# Patient Record
Sex: Male | Born: 1965 | Race: Black or African American | Hispanic: No | Marital: Single | Smoking: Former smoker
Health system: Southern US, Community
[De-identification: ages and names within clinical notes are randomized; demographics above are authoritative.]

## PROBLEM LIST (undated history)

## (undated) DIAGNOSIS — I77819 Aortic ectasia, unspecified site: Secondary | ICD-10-CM

## (undated) DIAGNOSIS — I491 Atrial premature depolarization: Secondary | ICD-10-CM

## (undated) DIAGNOSIS — I77810 Thoracic aortic ectasia: Secondary | ICD-10-CM

## (undated) DIAGNOSIS — I480 Paroxysmal atrial fibrillation: Secondary | ICD-10-CM

## (undated) DIAGNOSIS — I441 Atrioventricular block, second degree: Secondary | ICD-10-CM

## (undated) DIAGNOSIS — G4730 Sleep apnea, unspecified: Secondary | ICD-10-CM

## (undated) DIAGNOSIS — I10 Essential (primary) hypertension: Secondary | ICD-10-CM

## (undated) DIAGNOSIS — I2510 Atherosclerotic heart disease of native coronary artery without angina pectoris: Secondary | ICD-10-CM

## (undated) DIAGNOSIS — K219 Gastro-esophageal reflux disease without esophagitis: Secondary | ICD-10-CM

## (undated) DIAGNOSIS — I050 Rheumatic mitral stenosis: Secondary | ICD-10-CM

## (undated) DIAGNOSIS — I493 Ventricular premature depolarization: Secondary | ICD-10-CM

## (undated) DIAGNOSIS — J45909 Unspecified asthma, uncomplicated: Secondary | ICD-10-CM

## (undated) DIAGNOSIS — E119 Type 2 diabetes mellitus without complications: Secondary | ICD-10-CM

## (undated) DIAGNOSIS — I452 Bifascicular block: Secondary | ICD-10-CM

## (undated) DIAGNOSIS — I421 Obstructive hypertrophic cardiomyopathy: Secondary | ICD-10-CM

## (undated) DIAGNOSIS — I350 Nonrheumatic aortic (valve) stenosis: Secondary | ICD-10-CM

## (undated) HISTORY — DX: Atrial premature depolarization: I49.1

## (undated) HISTORY — DX: Ventricular premature depolarization: I49.3

## (undated) HISTORY — DX: Atherosclerotic heart disease of native coronary artery without angina pectoris: I25.10

## (undated) HISTORY — DX: Rheumatic mitral stenosis: I05.0

## (undated) HISTORY — DX: Atrioventricular block, second degree: I44.1

## (undated) HISTORY — DX: Thoracic aortic ectasia: I77.810

## (undated) HISTORY — DX: Obstructive hypertrophic cardiomyopathy: I42.1

## (undated) HISTORY — DX: Nonrheumatic aortic (valve) stenosis: I35.0

## (undated) HISTORY — PX: APPENDECTOMY: SHX54

## (undated) HISTORY — DX: Aortic ectasia, unspecified site: I77.819

## (undated) HISTORY — DX: Paroxysmal atrial fibrillation: I48.0

## (undated) HISTORY — DX: Bifascicular block: I45.2

---

## 1997-11-20 ENCOUNTER — Emergency Department (HOSPITAL_COMMUNITY): Admission: EM | Admit: 1997-11-20 | Discharge: 1997-11-20 | Payer: Self-pay | Admitting: Emergency Medicine

## 2003-04-05 ENCOUNTER — Emergency Department (HOSPITAL_COMMUNITY): Admission: AD | Admit: 2003-04-05 | Discharge: 2003-04-05 | Payer: Self-pay | Admitting: Family Medicine

## 2003-04-06 ENCOUNTER — Emergency Department (HOSPITAL_COMMUNITY): Admission: EM | Admit: 2003-04-06 | Discharge: 2003-04-06 | Payer: Self-pay | Admitting: Emergency Medicine

## 2013-08-09 ENCOUNTER — Ambulatory Visit: Payer: Self-pay

## 2013-08-10 ENCOUNTER — Emergency Department (HOSPITAL_COMMUNITY): Payer: Self-pay

## 2013-08-10 ENCOUNTER — Encounter (HOSPITAL_COMMUNITY): Payer: Self-pay | Admitting: Emergency Medicine

## 2013-08-10 ENCOUNTER — Emergency Department (INDEPENDENT_AMBULATORY_CARE_PROVIDER_SITE_OTHER)
Admission: EM | Admit: 2013-08-10 | Discharge: 2013-08-10 | Disposition: A | Discharge status: Other Acute Inpatient Hospital | Payer: Self-pay | Source: Home / Self Care | Attending: Family Medicine | Admitting: Family Medicine

## 2013-08-10 ENCOUNTER — Inpatient Hospital Stay (HOSPITAL_COMMUNITY)
Admission: EM | Admit: 2013-08-10 | Discharge: 2013-08-12 | DRG: 639 | Disposition: A | Discharge status: Home / Self Care | Payer: Self-pay | Attending: Internal Medicine | Admitting: Internal Medicine

## 2013-08-10 DIAGNOSIS — Z23 Encounter for immunization: Secondary | ICD-10-CM

## 2013-08-10 DIAGNOSIS — E119 Type 2 diabetes mellitus without complications: Secondary | ICD-10-CM | POA: Diagnosis present

## 2013-08-10 DIAGNOSIS — M25519 Pain in unspecified shoulder: Secondary | ICD-10-CM | POA: Diagnosis present

## 2013-08-10 DIAGNOSIS — E118 Type 2 diabetes mellitus with unspecified complications: Secondary | ICD-10-CM

## 2013-08-10 DIAGNOSIS — K5900 Constipation, unspecified: Secondary | ICD-10-CM | POA: Diagnosis present

## 2013-08-10 DIAGNOSIS — E1110 Type 2 diabetes mellitus with ketoacidosis without coma: Secondary | ICD-10-CM

## 2013-08-10 DIAGNOSIS — G8929 Other chronic pain: Secondary | ICD-10-CM | POA: Diagnosis present

## 2013-08-10 DIAGNOSIS — I10 Essential (primary) hypertension: Secondary | ICD-10-CM | POA: Diagnosis present

## 2013-08-10 DIAGNOSIS — I519 Heart disease, unspecified: Secondary | ICD-10-CM | POA: Diagnosis present

## 2013-08-10 DIAGNOSIS — F17200 Nicotine dependence, unspecified, uncomplicated: Secondary | ICD-10-CM | POA: Diagnosis present

## 2013-08-10 DIAGNOSIS — J45909 Unspecified asthma, uncomplicated: Secondary | ICD-10-CM | POA: Insufficient documentation

## 2013-08-10 DIAGNOSIS — E1310 Other specified diabetes mellitus with ketoacidosis without coma: Principal | ICD-10-CM | POA: Diagnosis present

## 2013-08-10 DIAGNOSIS — K219 Gastro-esophageal reflux disease without esophagitis: Secondary | ICD-10-CM | POA: Insufficient documentation

## 2013-08-10 HISTORY — DX: Gastro-esophageal reflux disease without esophagitis: K21.9

## 2013-08-10 HISTORY — DX: Unspecified asthma, uncomplicated: J45.909

## 2013-08-10 HISTORY — DX: Type 2 diabetes mellitus without complications: E11.9

## 2013-08-10 HISTORY — DX: Type 2 diabetes mellitus with ketoacidosis without coma: E11.10

## 2013-08-10 HISTORY — DX: Essential (primary) hypertension: I10

## 2013-08-10 LAB — GLUCOSE, CAPILLARY
Glucose-Capillary: 383 mg/dL — ABNORMAL HIGH (ref 70–99)
Glucose-Capillary: 288 mg/dL — ABNORMAL HIGH (ref 70–99)
Glucose-Capillary: 293 mg/dL — ABNORMAL HIGH (ref 70–99)
Glucose-Capillary: 264 mg/dL — ABNORMAL HIGH (ref 70–99)
Glucose-Capillary: 214 mg/dL — ABNORMAL HIGH (ref 70–99)
Glucose-Capillary: 242 mg/dL — ABNORMAL HIGH (ref 70–99)
Glucose-Capillary: 183 mg/dL — ABNORMAL HIGH (ref 70–99)
Glucose-Capillary: 172 mg/dL — ABNORMAL HIGH (ref 70–99)
Glucose-Capillary: 155 mg/dL — ABNORMAL HIGH (ref 70–99)

## 2013-08-10 LAB — POCT I-STAT, CHEM 8
BUN: 28 mg/dL — ABNORMAL HIGH (ref 6–23)
Calcium, Ion: 1.28 mmol/L — ABNORMAL HIGH (ref 1.12–1.23)
Chloride: 94 mEq/L — ABNORMAL LOW (ref 96–112)
Creatinine, Ser: 1.1 mg/dL (ref 0.50–1.35)
Glucose, Bld: 592 mg/dL (ref 70–99)
HCT: 49 % (ref 39.0–52.0)
Hemoglobin: 16.7 g/dL (ref 13.0–17.0)
Potassium: 4.9 mEq/L (ref 3.7–5.3)
Sodium: 131 mEq/L — ABNORMAL LOW (ref 137–147)
TCO2: 24 mmol/L (ref 0–100)

## 2013-08-10 LAB — POCT URINALYSIS DIP (DEVICE)
Bilirubin Urine: NEGATIVE
Glucose, UA: >=1000 mg/dL — AB
Ketones, ur: 40 mg/dL — AB
Leukocytes, UA: NEGATIVE
Nitrite: NEGATIVE
Protein, ur: NEGATIVE mg/dL
Specific Gravity, Urine: <=1.005 (ref 1.005–1.030)
Urobilinogen, UA: 0.2 mg/dL (ref 0.0–1.0)
pH: 5.5 (ref 5.0–8.0)

## 2013-08-10 LAB — BASIC METABOLIC PANEL
BUN: 21 mg/dL (ref 6–23)
BUN: 19 mg/dL (ref 6–23)
BUN: 17 mg/dL (ref 6–23)
CO2: 17 mEq/L — ABNORMAL LOW (ref 19–32)
CO2: 22 mEq/L (ref 19–32)
CO2: 23 mEq/L (ref 19–32)
Calcium: 9.4 mg/dL (ref 8.4–10.5)
Calcium: 9.4 mg/dL (ref 8.4–10.5)
Calcium: 9 mg/dL (ref 8.4–10.5)
Chloride: 96 mEq/L (ref 96–112)
Chloride: 97 mEq/L (ref 96–112)
Chloride: 99 mEq/L (ref 96–112)
Creatinine, Ser: 0.75 mg/dL (ref 0.50–1.35)
Creatinine, Ser: 0.76 mg/dL (ref 0.50–1.35)
Creatinine, Ser: 0.72 mg/dL (ref 0.50–1.35)
GFR calc Af Amer: >90 mL/min (ref >90)
GFR calc Af Amer: >90 mL/min (ref >90)
GFR calc Af Amer: >90 mL/min (ref >90)
GFR calc non Af Amer: >90 mL/min (ref >90)
GFR calc non Af Amer: >90 mL/min (ref >90)
GFR calc non Af Amer: >90 mL/min (ref >90)
Glucose, Bld: 365 mg/dL — ABNORMAL HIGH (ref 70–99)
Glucose, Bld: 246 mg/dL — ABNORMAL HIGH (ref 70–99)
Glucose, Bld: 164 mg/dL — ABNORMAL HIGH (ref 70–99)
Potassium: 4.5 mEq/L (ref 3.7–5.3)
Potassium: 4 mEq/L (ref 3.7–5.3)
Potassium: 3.6 mEq/L — ABNORMAL LOW (ref 3.7–5.3)
Sodium: 135 mEq/L — ABNORMAL LOW (ref 137–147)
Sodium: 136 mEq/L — ABNORMAL LOW (ref 137–147)
Sodium: 137 mEq/L (ref 137–147)

## 2013-08-10 LAB — COMPREHENSIVE METABOLIC PANEL
ALT: 16 U/L (ref 0–53)
AST: 18 U/L (ref 0–37)
Albumin: 3.9 g/dL (ref 3.5–5.2)
Alkaline Phosphatase: 99 U/L (ref 39–117)
BUN: 27 mg/dL — ABNORMAL HIGH (ref 6–23)
CO2: 23 mEq/L (ref 19–32)
Calcium: 10.3 mg/dL (ref 8.4–10.5)
Chloride: 88 mEq/L — ABNORMAL LOW (ref 96–112)
Creatinine, Ser: 0.97 mg/dL (ref 0.50–1.35)
GFR calc Af Amer: >90 mL/min (ref >90)
GFR calc non Af Amer: >90 mL/min (ref >90)
Glucose, Bld: 577 mg/dL (ref 70–99)
Potassium: 5.6 mEq/L — ABNORMAL HIGH (ref 3.7–5.3)
Sodium: 132 mEq/L — ABNORMAL LOW (ref 137–147)
Total Bilirubin: 0.3 mg/dL (ref 0.3–1.2)
Total Protein: 8.2 g/dL (ref 6.0–8.3)

## 2013-08-10 LAB — I-STAT ARTERIAL BLOOD GAS, ED
Acid-base deficit: 4 mmol/L — ABNORMAL HIGH (ref 0.0–2.0)
Bicarbonate: 20.8 mEq/L (ref 20.0–24.0)
O2 Saturation: 97 %
TCO2: 22 mmol/L (ref 0–100)
pCO2 arterial: 35.4 mmHg (ref 35.0–45.0)
pH, Arterial: 7.377 (ref 7.350–7.450)
pO2, Arterial: 87 mmHg (ref 80.0–100.0)

## 2013-08-10 LAB — URINALYSIS, ROUTINE W REFLEX MICROSCOPIC
Bilirubin Urine: NEGATIVE
Glucose, UA: >1000 mg/dL — AB
Hgb urine dipstick: NEGATIVE
Ketones, ur: 40 mg/dL — AB
Leukocytes, UA: NEGATIVE
Nitrite: NEGATIVE
Protein, ur: NEGATIVE mg/dL
Specific Gravity, Urine: 1.039 — ABNORMAL HIGH (ref 1.005–1.030)
Urobilinogen, UA: 0.2 mg/dL (ref 0.0–1.0)
pH: 5 (ref 5.0–8.0)

## 2013-08-10 LAB — CBC
HCT: 42.4 % (ref 39.0–52.0)
Hemoglobin: 15.7 g/dL (ref 13.0–17.0)
MCH: 33.6 pg (ref 26.0–34.0)
MCHC: 37 g/dL — ABNORMAL HIGH (ref 30.0–36.0)
MCV: 90.8 fL (ref 78.0–100.0)
Platelets: 223 10*3/uL (ref 150–400)
RBC: 4.67 MIL/uL (ref 4.22–5.81)
RDW: 11.9 % (ref 11.5–15.5)
WBC: 9.8 10*3/uL (ref 4.0–10.5)

## 2013-08-10 LAB — URINE MICROSCOPIC-ADD ON

## 2013-08-10 MED ORDER — ACETAMINOPHEN 325 MG PO TABS
650.0000 mg | ORAL_TABLET | Freq: Four times a day (QID) | ORAL | Status: DC | PRN
  Administered 2013-08-10 – 2013-08-11 (×2): 650 mg via ORAL
  Filled 2013-08-10 (×2): qty 2

## 2013-08-10 MED ORDER — SODIUM CHLORIDE 0.9 % IV SOLN
INTRAVENOUS | Status: DC
  Administered 2013-08-10: 2.3 [IU]/h via INTRAVENOUS
  Administered 2013-08-10: 3.2 [IU]/h via INTRAVENOUS
  Administered 2013-08-10: 4.7 [IU]/h via INTRAVENOUS
  Administered 2013-08-10: 3.1 [IU]/h via INTRAVENOUS
  Administered 2013-08-11: 9.2 [IU]/h via INTRAVENOUS
  Administered 2013-08-11: 2.6 [IU]/h via INTRAVENOUS
  Administered 2013-08-11: 7.4 [IU]/h via INTRAVENOUS
  Administered 2013-08-11: 2.3 [IU]/h via INTRAVENOUS
  Administered 2013-08-11: 2.6 [IU]/h via INTRAVENOUS
  Administered 2013-08-11 (×2): 4.4 [IU]/h via INTRAVENOUS
  Filled 2013-08-10 (×2): qty 1

## 2013-08-10 MED ORDER — PNEUMOCOCCAL VAC POLYVALENT 25 MCG/0.5ML IJ INJ
0.5000 mL | INJECTION | INTRAMUSCULAR | Status: AC
  Administered 2013-08-11: 0.5 mL via INTRAMUSCULAR
  Filled 2013-08-10: qty 0.5

## 2013-08-10 MED ORDER — SODIUM CHLORIDE 0.9 % IV SOLN
INTRAVENOUS | Status: DC
  Administered 2013-08-10 (×2): via INTRAVENOUS

## 2013-08-10 MED ORDER — SODIUM CHLORIDE 0.9 % IV BOLUS (SEPSIS)
2000.0000 mL | Freq: Once | INTRAVENOUS | Status: AC
  Administered 2013-08-10: 2000 mL via INTRAVENOUS

## 2013-08-10 MED ORDER — ACETAMINOPHEN 650 MG RE SUPP: 650.0000 mg | Freq: Four times a day (QID) | RECTAL | Status: DC | PRN

## 2013-08-10 MED ORDER — ENOXAPARIN SODIUM 40 MG/0.4ML ~~LOC~~ SOLN
40.0000 mg | SUBCUTANEOUS | Status: DC
  Administered 2013-08-10 – 2013-08-11 (×2): 40 mg via SUBCUTANEOUS
  Filled 2013-08-10 (×3): qty 0.4

## 2013-08-10 MED ORDER — INFLUENZA VAC SPLIT QUAD 0.5 ML IM SUSP
0.5000 mL | INTRAMUSCULAR | Status: AC
  Administered 2013-08-11: 0.5 mL via INTRAMUSCULAR
  Filled 2013-08-10: qty 0.5

## 2013-08-10 MED ORDER — DEXTROSE-NACL 5-0.45 % IV SOLN
INTRAVENOUS | Status: DC
  Administered 2013-08-10 – 2013-08-11 (×3): via INTRAVENOUS

## 2013-08-10 MED ORDER — ALBUTEROL SULFATE (2.5 MG/3ML) 0.083% IN NEBU: 2.5000 mg | INHALATION_SOLUTION | Freq: Four times a day (QID) | RESPIRATORY_TRACT | Status: DC | PRN

## 2013-08-10 MED ORDER — DEXTROSE 50 % IV SOLN: 25.0000 mL | INTRAVENOUS | Status: DC | PRN

## 2013-08-10 MED ORDER — LIVING WELL WITH DIABETES BOOK
Freq: Once | Status: DC
  Filled 2013-08-10: qty 1

## 2013-08-10 MED ORDER — INSULIN REGULAR BOLUS VIA INFUSION
0.0000 [IU] | Freq: Three times a day (TID) | INTRAVENOUS | Status: DC
  Filled 2013-08-10: qty 10

## 2013-08-10 NOTE — ED Provider Notes (Signed)
CSN: [id number]     Arrival date & time 08/10/13  5732 History   First MD Initiated Contact with Patient 08/10/13 [phone]     Chief Complaint  Patient presents with  . Blood Sugar Problem   (Consider location/radiation/quality/duration/timing/severity/associated sxs/prior Treatment) Patient is a 48 y.o. male presenting with diabetes problem. The history is provided by the patient and the spouse.  Diabetes This is a chronic problem. The current episode started more than 1 week ago. The problem has been gradually worsening. Pertinent negatives include no abdominal pain. Associated symptoms comments: Polydipsia, polyuria for sev mos., nocturia..    Past Medical History  Diagnosis Date  . Diabetes mellitus without complication   . Hypertension    History reviewed. No pertinent past surgical history. History reviewed. No pertinent family history. History  Substance Use Topics  . Smoking status: Current Every Day Smoker  . Smokeless tobacco: Not on file  . Alcohol Use: No    Review of Systems  Constitutional: Negative.   Gastrointestinal: Negative for abdominal pain.  Endocrine: Positive for polydipsia and polyuria.  Genitourinary: Positive for frequency.    Allergies  Review of patient's allergies indicates not on file.  Home Medications  No current outpatient prescriptions on file. BP 137/79  Pulse 92  Temp(Src) 97.5 F (36.4 C) (Oral)  Resp 16  SpO2 99% Physical Exam  Nursing note and vitals reviewed. Constitutional: He is oriented to person, place, and time. He appears well-developed and well-nourished.  Neck: Normal range of motion. Neck supple.  Cardiovascular: Normal heart sounds.   Pulmonary/Chest: Effort normal and breath sounds normal.  Lymphadenopathy:    He has no cervical adenopathy.  Neurological: He is alert and oriented to person, place, and time.  Skin: Skin is warm and dry.    ED Course  Procedures (including critical care time) Labs Review Labs  Reviewed  POCT I-STAT, CHEM 8 - Abnormal; Notable for the following:    Sodium 131 (*)    Chloride 94 (*)    BUN 28 (*)    Glucose, Bld 592 (*)    Calcium, Ion 1.28 (*)    All other components within normal limits  POCT URINALYSIS DIP (DEVICE) - Abnormal; Notable for the following:    Glucose, UA >=1000 (*)    Ketones, ur 40 (*)    Hgb urine dipstick TRACE (*)    All other components within normal limits   Imaging Review No results found. i-stat glu 592.  MDM   1. DM (diabetes mellitus) with complications     Sent for early dka. No lmd.    Billy Fischer, MD 08/10/13 [phone]

## 2013-08-10 NOTE — H&P (Signed)
Date: 08/10/2013               Patient Name:  Allen Gould MRN: [id number]  DOB: [birth year] Age / Sex: 48 y.o., male   PCP: No Pcp Per Patient         Medical Service: Internal Medicine Teaching Service         Attending Physician: Dr. Bartholomew Crews, MD    First Contact: Dr. Stann Mainland Pager: [phone]  Second Contact: Dr. Algis Liming Pager: [phone]       After Hours (After 5p/  First Contact Pager: [phone]  weekends / holidays): Second Contact Pager: [phone]   Chief Complaint: hyperglycemia  History of Present Illness:  Allen Gould is a 48 year old man with history of DM2 (no A1C on record), HTN, asthma who presents with polydipsia, polyuria, abdominal pain x 2 weeks.   Patient states he has been drinking water and urinating non-stop for the past 2 weeks.  He has also had intermittent abdominal pain and one episode of emesis 3 days ago that looked like food he had eaten, no blood.  He sought medical attention at Urgent Care this morning where he was told his blood sugar was in high 500s and sent to ED.  He has not had any medical follow-up in 3 years, has been treated with insulin in past but has not taken any medications including insulin in over 2 years.  Patient reports one previous episode of DKA.  Denies headache, chest pain, shortness of breath, nausea, dysuria, change in bowel habits (though reported constipation to EDP).   Patient also complaining of chronic right shoulder pain that he believes is due to rotator cuff tear.  States that his shoulder hurts all the time, not worsened with movement.  He tried taking Aleve which helped some, but he stopped taking it when he felt it was no longer working.   In ED, labs consistent with DKA: BMP showed glucose 577, HCO3 23, anion gap 21. ABG revealed pH 7.377. + ketonuria. CBC showed WBC 9.8 (within normal limits).  Patient was given 2L NS bolus, started on insulin gtt, and IMTS called for admission. Most recent CBG 383.    Meds: Current  Facility-Administered Medications  Medication Dose Route Frequency Provider Last Rate Last Dose  . insulin regular (NOVOLIN R,HUMULIN R) 1 Units/mL in sodium chloride 0.9 % 100 mL infusion   Intravenous Continuous Rebecca Eaton, MD       No current outpatient prescriptions on file.    Allergies: Allergies as of 08/10/2013 - Review Complete 08/10/2013  Allergen Reaction Noted  . Shellfish allergy Hives 08/10/2013   Past Medical History  Diagnosis Date  . Diabetes mellitus without complication   . Hypertension   . Asthma   . Heart disease    History reviewed. No pertinent past surgical history. History reviewed. No pertinent family history. History   Social History  . Marital Status: Significant Other    Spouse Name: N/A    Number of Children: N/A  . Years of Education: N/A   Occupational History  . Not on file.   Social History Main Topics  . Smoking status: Current Some Day Smoker    Types: Cigarettes  . Smokeless tobacco: Not on file  . Alcohol Use: No  . Drug Use: No  . Sexual Activity: Not on file   Other Topics Concern  . Not on file   Social History Narrative  . No narrative on file  Patient smokes one  cigarette/day, no EtOH, no illicits.  Lives with significant other.    Review of Systems: Review of Systems  Constitutional: Negative for fever.  Eyes: Positive for blurred vision.  Respiratory: Negative for cough and shortness of breath.   Cardiovascular: Negative for chest pain and leg swelling.  Gastrointestinal: Positive for vomiting. Negative for nausea, abdominal pain, diarrhea and constipation.       Emesis 3 days ago  Genitourinary: Negative for dysuria, urgency and frequency.  Musculoskeletal: Positive for joint pain. Negative for falls and myalgias.       Right shoulder x 10 months  Skin: Negative for rash.  Neurological: Negative for dizziness, loss of consciousness, weakness and headaches.  Endo/Heme/Allergies: Positive for polydipsia.         + polyuria    Physical Exam: Blood pressure 112/87, pulse 88, temperature 98.2 F (36.8 C), temperature source Oral, resp. rate 16, height 5\' 10"  (1.778 m), weight 294 lb 3.2 oz (133.448 kg), SpO2 98.00%. PEX General: alert, cooperative, and in no apparent distress HEENT: NCAT, vision grossly intact, oropharynx clear and non-erythematous  Neck: supple, no lymphadenopathy Lungs: clear to ascultation bilaterally, normal work of respiration, no wheezes, rales, ronchi Heart: regular rate and rhythm, no murmurs, gallops, or rubs Abdomen: soft, non-tender, non-distended, normal bowel sounds Extremities: 2+ DP/PT pulses bilaterally, no cyanosis, clubbing, or edema Neurologic: alert & oriented X3, cranial nerves II-XII intact, strength grossly intact, sensation intact to light touch   Lab results: Basic Metabolic Panel:  Recent Labs  08/10/13 0913 08/10/13 1012  NA 131* 132*  K 4.9 5.6*  CL 94* 88*  CO2  --  23  GLUCOSE 592* 577*  BUN 28* 27*  CREATININE 1.10 0.97  CALCIUM  --  10.3   Liver Function Tests:  Recent Labs  08/10/13 1012  AST 18  ALT 16  ALKPHOS 99  BILITOT 0.3  PROT 8.2  ALBUMIN 3.9   CBC:  Recent Labs  08/10/13 0913 08/10/13 1012  WBC  --  9.8  HGB 16.7 15.7  HCT 49.0 42.4  MCV  --  90.8  PLT  --  223   Urinalysis:  Recent Labs  08/10/13 0919 08/10/13 1240  COLORURINE  --  YELLOW  LABSPEC <=1.005 1.039*  PHURINE 5.5 5.0  GLUCOSEU >=1000* >1000*  HGBUR TRACE* NEGATIVE  BILIRUBINUR NEGATIVE NEGATIVE  KETONESUR 40* 40*  PROTEINUR NEGATIVE NEGATIVE  UROBILINOGEN 0.2 0.2  NITRITE NEGATIVE NEGATIVE  LEUKOCYTESUR NEGATIVE NEGATIVE    Imaging results:  Dg Chest 2 View  08/10/2013   CLINICAL DATA:  Hyperglycemia  EXAM: CHEST  2 VIEW  COMPARISON:  07/09/2008  FINDINGS: Cardiomediastinal silhouette is unremarkable. No acute infiltrate or pleural effusion. No pulmonary edema. Bony thorax is unremarkable.  IMPRESSION: No active  cardiopulmonary disease.   Electronically Signed   By: Lahoma Crocker M.D.   On: 08/10/2013 12:24    Other results: EKG: pending  Assessment & Plan by Problem: #DKA- On admission, BMP showed glucose 577, HCO3 23, AG 21, K 5.6; + ketonuria.  ABG showed pH 7.377, HCO3 21.  No evidence of infection, afebrile, no tachycardia or tachypnea, no leucocytosis.  CXR negative, UA negative for infection.  DKA was likely precipitated by lack of insulin for the past 2.5 years days. He is feeling better since fluids and insulin treatment, no complaints other than still feeling somewhat thirsty.  -admit to IMTS on telemetry  -DKA protocol (continue insulin gtt, IVFs, NPO)  -NSS --> D5-0.5NSS once BG < 250  -  BMPs q2h until AG closed x 2  -monitor K and replete as necessary  -start [place] insulin (Lantus 10) once AG closed x 2  -continue insulin drip for 2 hours after [place] insulin started  -EKG pending  -repeat A1C pending -consider consult to diabetes educator -arrange PCP follow-up   #History of HTN- Currently normotensive on no home meds.  -continue to monitor  #History of asthma- No shortness of breath or wheezing, on no home meds.  -continue to monitor  #DVT PPX- lovenox    Dispo: Disposition is deferred at this time, awaiting improvement of current medical problems. Anticipated discharge in approximately 1-2 day(s).   The patient does not have a current PCP (No Pcp Per Patient) and does need an [facility] hospital follow-up appointment after discharge.   Signed: [name], MD 08/10/2013, 1:36 PM

## 2013-08-10 NOTE — ED Notes (Addendum)
Pt  Reports  Symptoms    Of  incraesed  Thirst  And  Frequent  Urination as  Well  As     Gi    Symptoms  To  Include        Dyspepsia    And        Constipation      Pt  Also  Reports  Symptoms   Of     Pain in r  Shoulder    That is  Worse  On movement         Voices  No  Recent  Injury  Pt  Reports  Takes  No  meds  And  Has  Not seen a  Provider    In  Years

## 2013-08-10 NOTE — Progress Notes (Signed)
Cesar Rogerson [id number] Admission Data: 08/10/2013 2:23 PM Attending Provider: Bartholomew Crews, MD  PCP:No PCP Per Patient Consults/ Treatment Team:    Allen Gould Reasoner is a 48 y.o. male patient admitted from ED awake, alert  & orientated  X 3,  No Order, VSS - Blood pressure 145/91, pulse 78, temperature 97.8 F (36.6 C), temperature source Oral, resp. rate 18, height 5\' 10"  (1.778 m), weight 133.04 kg (293 lb 4.8 oz), SpO2 98.00%., no c/o shortness of breath, no c/o chest pain, no distress noted. Tele # 1 placed and pt is currently running:normal sinus rhythm.   IV site WDL:  forearm right, condition patent and no redness with a transparent dsg that's clean dry and intact.  Allergies:   Allergies  Allergen Reactions  . Shellfish Allergy Hives     Past Medical History  Diagnosis Date  . Diabetes mellitus without complication   . Hypertension   . Asthma   . Heart disease     History:  obtained from the patient. Tobacco/alcohol: 1-2 cigarettes/day x 10 year  none  Pt orientation to unit, room and routine. Information packet given to patient/family and safety video watched.  Admission INP armband ID verified with patient/family, and in place. SR up x 2, fall risk assessment complete with Patient and family verbalizing understanding of risks associated with falls. Pt verbalizes an understanding of how to use the call bell and to call for help before getting out of bed.  Skin, clean-dry- intact without evidence of bruising, or skin tears.   No evidence of skin break down noted on exam. no rashes, no ecchymoses    Will cont to monitor and assist as needed.  Symphany Fleissner Margaretha Sheffield, RN 08/10/2013 2:23 PM

## 2013-08-10 NOTE — ED Notes (Signed)
Pt transfer from [facility] for hyperglycemia. He went for evaluation today due to recent "abd pain, cant sleep, blurry vision, frequent urination, fatigue, nausea." he has history of diabetes but has not been on medication for it.

## 2013-08-10 NOTE — ED Provider Notes (Signed)
I saw and evaluated the patient, reviewed the resident's note and I agree with the findings and plan. If applicable, I agree with the resident's interpretation of the EKG.  If applicable, I was present for critical portions of any procedures performed.  10 days of blurry vision, polyuria, polydipsia and abdominal pain.  No fever.  No DM meds x 2 years. Nontoxic, abdomen soft, diffusely tender DKA with AG 21. IVF, insulin gtt, admit.  CRITICAL CARE Performed by: Ezequiel Essex Total critical care time: 30 Critical care time was exclusive of separately billable procedures and treating other patients. Critical care was necessary to treat or prevent imminent or life-threatening deterioration. Critical care was time spent personally by me on the following activities: development of treatment plan with patient and/or surrogate as well as nursing, discussions with consultants, evaluation of patient's response to treatment, examination of patient, obtaining history from patient or surrogate, ordering and performing treatments and interventions, ordering and review of laboratory studies, ordering and review of radiographic studies, pulse oximetry and re-evaluation of patient's condition.   Ezequiel Essex, MD 08/10/13 [phone]

## 2013-08-10 NOTE — ED Provider Notes (Signed)
CSN: [id number]     Arrival date & time 08/10/13  9323 History   None    Chief Complaint  Patient presents with  . Hyperglycemia     (Consider location/radiation/quality/duration/timing/severity/associated sxs/prior Treatment) Patient is a 48 y.o. male presenting with hyperglycemia. The history is provided by the patient and the spouse. No language interpreter was used.  Hyperglycemia Associated symptoms: abdominal pain, increased thirst, nausea and polyuria   Associated symptoms: no chest pain, no dysuria, no fever, no shortness of breath and no vomiting    This is a 48yo AAM w/ PMH DM, HTN, asthma who presents with c/o hyperglycemia, polyuria, polydipsia, intermittent blurry vision and abd pain x 1.5 wks. Patient reports that he was over at the Urgent Care clinic this AM and was told his blood glucose was 592. He was treated for DM in the past w/ insulin but has not been on insulin for over 2 years. He notes he has been urinating and drinking water "nonstop" over the last week. Patient notes having diffuse abd pain over this same time period associated w/ some nausea, no vomiting. He has been constipated recently as well. No SOB, CP, BLE edema, HA, dysuria.   Past Medical History  Diagnosis Date  . Diabetes mellitus without complication   . Hypertension   . Asthma   . Heart disease    History reviewed. No pertinent past surgical history. History reviewed. No pertinent family history. History  Substance Use Topics  . Smoking status: Current Some Day Smoker    Types: Cigarettes  . Smokeless tobacco: Not on file  . Alcohol Use: No    Review of Systems  Constitutional: Negative for fever and chills.  Eyes: Positive for visual disturbance.  Respiratory: Negative for cough and shortness of breath.   Cardiovascular: Negative for chest pain and leg swelling.  Gastrointestinal: Positive for nausea, abdominal pain and constipation. Negative for vomiting and diarrhea.  Endocrine:  Positive for polydipsia and polyuria.  Genitourinary: Positive for frequency. Negative for dysuria.  Musculoskeletal: Positive for back pain.  Neurological: Negative for light-headedness and headaches.  All other systems reviewed and are negative.      Allergies  Shellfish allergy  Home Medications  No current outpatient prescriptions on file. BP 134/92  Pulse 92  Temp(Src) 98.2 F (36.8 C) (Oral)  Resp 16  Ht 5\' 10"  (1.778 m)  Wt 294 lb 3.2 oz (133.448 kg)  BMI 42.21 kg/m2  SpO2 98% Physical Exam  Nursing note and vitals reviewed. Constitutional: He is oriented to person, place, and time. He appears well-developed and well-nourished. No distress.  HENT:  Head: Normocephalic and atraumatic.  Mouth/Throat: Oropharynx is clear and moist.  Eyes: Conjunctivae and EOM are normal. Pupils are equal, round, and reactive to light.  Neck: Normal range of motion. Neck supple.  Cardiovascular: Normal rate, regular rhythm and normal heart sounds.   Pulmonary/Chest: Effort normal and breath sounds normal. No respiratory distress.  Abdominal: Soft. There is tenderness. There is no rebound.  Protuberant abdomen w/ diminished bowel sounds; diffusely ttp  Musculoskeletal: He exhibits no edema.  Neurological: He is alert and oriented to person, place, and time. No cranial nerve deficit.  Skin: Skin is warm and dry. He is not diaphoretic.    ED Course  Procedures (including critical care time) Labs Review Labs Reviewed  CBC - Abnormal; Notable for the following:    MCHC 37.0 (*)    All other components within normal limits  COMPREHENSIVE METABOLIC PANEL  URINALYSIS, ROUTINE W REFLEX MICROSCOPIC   Imaging Review No results found.   EKG Interpretation None      MDM   This is a 48yo AAM w/ PMH untreated DM, HTN, asthma who presents w/ abd pain, nausea and s/s of uncontrolled diabetes and found to have an AG 21 on CMP and ketones on UA- likely 2/2 DKA. Patient has not been taking  any insulin for over 2 years, though he has been on insulin therapy in the past. CBC unrevealing. BUN/Cr ratio >20, which is indicative of dehydration. K 5.6. Will give 2L NS and start an insulin gtt. Will obtain ABG to assess pH.  1:18 PM Patient's VSS and he is stable. Patient's pH 7.37. I spoke with Dr. Elnora Morrison with IMTS. They will admit patient.   Rebecca Eaton, MD 08/10/13 1320

## 2013-08-10 NOTE — Progress Notes (Signed)
Received report from [employer].

## 2013-08-11 DIAGNOSIS — E1010 Type 1 diabetes mellitus with ketoacidosis without coma: Secondary | ICD-10-CM

## 2013-08-11 LAB — GLUCOSE, CAPILLARY
Glucose-Capillary: 107 mg/dL — ABNORMAL HIGH (ref 70–99)
Glucose-Capillary: 126 mg/dL — ABNORMAL HIGH (ref 70–99)
Glucose-Capillary: 135 mg/dL — ABNORMAL HIGH (ref 70–99)
Glucose-Capillary: 147 mg/dL — ABNORMAL HIGH (ref 70–99)
Glucose-Capillary: 147 mg/dL — ABNORMAL HIGH (ref 70–99)
Glucose-Capillary: 147 mg/dL — ABNORMAL HIGH (ref 70–99)
Glucose-Capillary: 148 mg/dL — ABNORMAL HIGH (ref 70–99)
Glucose-Capillary: 155 mg/dL — ABNORMAL HIGH (ref 70–99)
Glucose-Capillary: 156 mg/dL — ABNORMAL HIGH (ref 70–99)
Glucose-Capillary: 156 mg/dL — ABNORMAL HIGH (ref 70–99)
Glucose-Capillary: 161 mg/dL — ABNORMAL HIGH (ref 70–99)
Glucose-Capillary: 164 mg/dL — ABNORMAL HIGH (ref 70–99)
Glucose-Capillary: 164 mg/dL — ABNORMAL HIGH (ref 70–99)
Glucose-Capillary: 171 mg/dL — ABNORMAL HIGH (ref 70–99)
Glucose-Capillary: 185 mg/dL — ABNORMAL HIGH (ref 70–99)
Glucose-Capillary: 243 mg/dL — ABNORMAL HIGH (ref 70–99)
Glucose-Capillary: 245 mg/dL — ABNORMAL HIGH (ref 70–99)
Glucose-Capillary: 297 mg/dL — ABNORMAL HIGH (ref 70–99)
Glucose-Capillary: 336 mg/dL — ABNORMAL HIGH (ref 70–99)
Glucose-Capillary: 340 mg/dL — ABNORMAL HIGH (ref 70–99)

## 2013-08-11 LAB — BASIC METABOLIC PANEL
BUN: 16 mg/dL (ref 6–23)
BUN: 15 mg/dL (ref 6–23)
BUN: 13 mg/dL (ref 6–23)
BUN: 13 mg/dL (ref 6–23)
CO2: 23 mEq/L (ref 19–32)
CO2: 21 mEq/L (ref 19–32)
CO2: 22 mEq/L (ref 19–32)
CO2: 23 mEq/L (ref 19–32)
Calcium: 8.8 mg/dL (ref 8.4–10.5)
Calcium: 8.7 mg/dL (ref 8.4–10.5)
Calcium: 8.8 mg/dL (ref 8.4–10.5)
Calcium: 8.9 mg/dL (ref 8.4–10.5)
Chloride: 99 mEq/L (ref 96–112)
Chloride: 99 mEq/L (ref 96–112)
Chloride: 102 mEq/L (ref 96–112)
Chloride: 99 mEq/L (ref 96–112)
Creatinine, Ser: 0.71 mg/dL (ref 0.50–1.35)
Creatinine, Ser: 0.76 mg/dL (ref 0.50–1.35)
Creatinine, Ser: 0.78 mg/dL (ref 0.50–1.35)
Creatinine, Ser: 0.78 mg/dL (ref 0.50–1.35)
GFR calc Af Amer: >90 mL/min (ref >90)
GFR calc Af Amer: >90 mL/min (ref >90)
GFR calc Af Amer: >90 mL/min (ref >90)
GFR calc Af Amer: >90 mL/min (ref >90)
GFR calc non Af Amer: >90 mL/min (ref >90)
GFR calc non Af Amer: >90 mL/min (ref >90)
GFR calc non Af Amer: >90 mL/min (ref >90)
GFR calc non Af Amer: >90 mL/min (ref >90)
Glucose, Bld: 170 mg/dL — ABNORMAL HIGH (ref 70–99)
Glucose, Bld: 182 mg/dL — ABNORMAL HIGH (ref 70–99)
Glucose, Bld: 129 mg/dL — ABNORMAL HIGH (ref 70–99)
Glucose, Bld: 193 mg/dL — ABNORMAL HIGH (ref 70–99)
Potassium: 3.3 mEq/L — ABNORMAL LOW (ref 3.7–5.3)
Potassium: 3.2 mEq/L — ABNORMAL LOW (ref 3.7–5.3)
Potassium: 4 mEq/L (ref 3.7–5.3)
Potassium: 4.1 mEq/L (ref 3.7–5.3)
Sodium: 137 mEq/L (ref 137–147)
Sodium: 135 mEq/L — ABNORMAL LOW (ref 137–147)
Sodium: 136 mEq/L — ABNORMAL LOW (ref 137–147)
Sodium: 135 mEq/L — ABNORMAL LOW (ref 137–147)

## 2013-08-11 LAB — HEMOGLOBIN A1C
Hgb A1c MFr Bld: 14.9 % — ABNORMAL HIGH (ref <5.7)
Mean Plasma Glucose: 381 mg/dL — ABNORMAL HIGH (ref <117)

## 2013-08-11 MED ORDER — INSULIN ASPART 100 UNIT/ML ~~LOC~~ SOLN
2.0000 [IU] | Freq: Once | SUBCUTANEOUS | Status: AC
  Administered 2013-08-11: 2 [IU] via SUBCUTANEOUS

## 2013-08-11 MED ORDER — INSULIN ASPART 100 UNIT/ML ~~LOC~~ SOLN
0.0000 [IU] | Freq: Three times a day (TID) | SUBCUTANEOUS | Status: DC
  Administered 2013-08-12: 3 [IU] via SUBCUTANEOUS

## 2013-08-11 MED ORDER — SODIUM CHLORIDE 0.9 % IV BOLUS (SEPSIS)
1000.0000 mL | Freq: Once | INTRAVENOUS | Status: AC
  Administered 2013-08-11: 1000 mL via INTRAVENOUS

## 2013-08-11 MED ORDER — POTASSIUM CHLORIDE CRYS ER 20 MEQ PO TBCR
40.0000 meq | EXTENDED_RELEASE_TABLET | Freq: Once | ORAL | Status: AC
  Administered 2013-08-11: 40 meq via ORAL
  Filled 2013-08-11: qty 2

## 2013-08-11 MED ORDER — SODIUM CHLORIDE 0.9 % IV SOLN
INTRAVENOUS | Status: DC
  Administered 2013-08-11 – 2013-08-12 (×2): via INTRAVENOUS

## 2013-08-11 MED ORDER — INSULIN GLARGINE 100 UNIT/ML ~~LOC~~ SOLN
10.0000 [IU] | Freq: Every day | SUBCUTANEOUS | Status: DC
  Administered 2013-08-11: 10 [IU] via SUBCUTANEOUS
  Filled 2013-08-11 (×2): qty 0.1

## 2013-08-11 MED ORDER — METFORMIN HCL 500 MG PO TABS: 500.0000 mg | ORAL_TABLET | Freq: Two times a day (BID) | ORAL | Status: DC

## 2013-08-11 NOTE — Progress Notes (Signed)
Subjective: [name] is feeling much better this morning, no complaints other than being hungry, walking halls to stay active.   Objective: Vital signs in last 24 hours: Filed Vitals:   08/10/13 1300 08/10/13 1403 08/10/13 2153 08/11/13 0620  BP: 112/87 145/91 146/90 128/86  Pulse: 88 78 76 71  Temp:  97.8 F (36.6 C) 98.7 F (37.1 C) 98 F (36.7 C)  TempSrc:  Oral Oral Oral  Resp: 16 18 20 18   Height:  5\' 10"  (1.778 m)    Weight:  293 lb 4.8 oz (133.04 kg)    SpO2: 98% 98% 93% 97%   Weight change:   Intake/Output Summary (Last 24 hours) at 08/11/13 0845 Last data filed at 08/11/13 0316  Gross per 24 hour  Intake 701.49 ml  Output    700 ml  Net   1.49 ml   PEX General: alert, cooperative, and in no apparent distress HEENT: NCAT, vision grossly intact, oropharynx clear and non-erythematous  Neck: supple, no lymphadenopathy Lungs: clear to ascultation bilaterally, normal work of respiration, no wheezes, rales, ronchi Heart: regular rate and rhythm, no murmurs, gallops, or rubs Abdomen: soft, non-tender, non-distended, normal bowel sounds Extremities: 2+ DP/PT pulses bilaterally, no cyanosis, clubbing, or edema Neurologic: alert & oriented X3, cranial nerves II-XII intact, strength grossly intact, sensation intact to light touch  Lab Results: Basic Metabolic Panel:  Recent Labs Lab 08/11/13 0251 08/11/13 0607  NA 137 135*  K 3.3* 3.2*  CL 99 99  CO2 23 21  GLUCOSE 170* 182*  BUN 16 15  CREATININE 0.71 0.76  CALCIUM 8.8 8.7   Liver Function Tests:  Recent Labs Lab 08/10/13 1012  AST 18  ALT 16  ALKPHOS 99  BILITOT 0.3  PROT 8.2  ALBUMIN 3.9   CBC:  Recent Labs Lab 08/10/13 0913 08/10/13 1012  WBC  --  9.8  HGB 16.7 15.7  HCT 49.0 42.4  MCV  --  90.8  PLT  --  223   CBG:  Recent Labs Lab 08/11/13 0314 08/11/13 0424 08/11/13 0531 08/11/13 0633 08/11/13 0734 08/11/13 0832  GLUCAP 164* 156* 156* 155* 164* 185*    Urinalysis:  Recent Labs Lab 08/10/13 0919 08/10/13 1240  COLORURINE  --  YELLOW  LABSPEC <=1.005 1.039*  PHURINE 5.5 5.0  GLUCOSEU >=1000* >1000*  HGBUR TRACE* NEGATIVE  BILIRUBINUR NEGATIVE NEGATIVE  KETONESUR 40* 40*  PROTEINUR NEGATIVE NEGATIVE  UROBILINOGEN 0.2 0.2  NITRITE NEGATIVE NEGATIVE  LEUKOCYTESUR NEGATIVE NEGATIVE   Studies/Results: Dg Chest 2 View  08/10/2013   CLINICAL DATA:  Hyperglycemia  EXAM: CHEST  2 VIEW  COMPARISON:  07/09/2008  FINDINGS: Cardiomediastinal silhouette is unremarkable. No acute infiltrate or pleural effusion. No pulmonary edema. Bony thorax is unremarkable.  IMPRESSION: No active cardiopulmonary disease.   Electronically Signed   By: Lahoma Crocker M.D.   On: 08/10/2013 12:24   Medications: I have reviewed the patient's current medications. Scheduled Meds: . enoxaparin (LOVENOX) injection  40 mg Subcutaneous Q24H  . influenza vac split quadrivalent PF  0.5 mL Intramuscular Tomorrow-1000  . insulin regular  0-10 Units Intravenous TID WC  . living well with diabetes book   Does not apply Once  . pneumococcal 23 valent vaccine  0.5 mL Intramuscular Tomorrow-1000  . sodium chloride  1,000 mL Intravenous Once   Continuous Infusions: . sodium chloride Stopped (08/10/13 2000)  . dextrose 5 % and 0.45% NaCl 125 mL/hr at 08/11/13 0350  . insulin (NOVOLIN-R) infusion 3.8 Units/hr (  08/11/13 0635)   PRN Meds:.acetaminophen, acetaminophen, albuterol, dextrose Assessment/Plan: #DKA- On admission, BMP showed glucose 577, HCO3 23, AG 21, K 5.6; + ketonuria. ABG showed pH 7.377, HCO3 21.  No evidence of infection, afebrile, no tachycardia or tachypnea, no leucocytosis. CXR negative, UA negative for infection. DKA was likely precipitated by lack of insulin for the past 2.5 years days.  A1C 14.9%.  Overnight, AG 15 x 3 --> 12.  He is feeling much better since fluids and insulin treatment. .   -DKA protocol (continue insulin gtt, IVFs, NPO)  -D5-0.5NSS since  BG < 250  -BMPs q2h until AG closed x 2  -monitor K and replete as necessary  -start [place] insulin (Lantus 10 units) once AG closed x 2  -continue insulin drip for 2 hours after [place] insulin started  -consult to diabetes educator   #History of HTN- Currently normotensive on no meds.  -continue to monitor   #History of asthma- No shortness of breath or wheezing, on no home meds.  -continue to monitor   Dispo: Disposition is deferred at this time, awaiting improvement of current medical problems.  Anticipated discharge in approximately 1 day(s).   The patient does not have a current PCP (No Pcp Per Patient) and does need an [facility] hospital follow-up appointment after discharge.   .Services Needed at time of discharge: Y = Yes, Blank = No PT:   OT:   RN:   Equipment:   Other:     LOS: 1 day   Allen Poot, MD 08/11/2013, 8:45 AM

## 2013-08-11 NOTE — H&P (Signed)
  Date: 08/11/2013  Patient name: Allen Gould  Medical record number: [id number]  Date of birth: 21-Jul-1965   I have seen and evaluated Germaine Pomfret and discussed their care with the Residency Team. Mr Havard was dx with DM when he was hospitalized 67 ys ago with appendicitis. While admitted, he was started on insulin and metformin. He states he had "seizure" but is unable to confirm that it was 2/2 hypoglycemia. When he was D/C'd he stopped both metformin and insulin bc of the episodes and the episodes resolved. About one yr ago, he was admitted for DKA and was told that he didn't need meds at D/C, partially bc he had lost 100 lbs. He was admitted with DKA / HHS with a CBG in the 500's. His A1C is pending.   His most recent gap was 12, bicarb 22, and CBG 147. He is being transitioned to a diet, NS, and SQ insulin.   Celesta Gentile states he struggles physically to do everything although the pt denies this. He snores, stops breathing, has AM HA, falls asleep easily at night, sleeps poorly 2/2 freq urination.   His fiancee had been insulin dep so both are very knowledgeable about DM and meds. He was supposed to have started a new job on day of admit.   Assessment and Plan: I have seen and evaluated the patient as outlined above. I agree with the formulated Assessment and Plan as detailed in the residents' admission note, with the following changes:   1. DKA / HHS - resolved. He will need insulin and metformin (start low and titrate up as had D when started 4 yrs ago). He is self pay, but will apply for orange card. Insulin selected will have to be affordable. No infxn ID'd as a trigger.  2. H/O HTN - BP OK here. Follow as outpt.   D/C later today / tomorrow. F/U [facility].   Bartholomew Crews, MD 3/21/20151:10 PM

## 2013-08-11 NOTE — Progress Notes (Signed)
Spoke with patient on the phone. Was diagnosed with diabetes about 4 years ago and was on medication for a brief time. Had attended DM classes at [facility] when diagnosed. Has not had any diabetes medication for over 2 years. Does not have health insurance.  Will need to be followed in the internal medicine clinic for blood sugar control and diet planning.  States that he has changed the way he eats to a more healthy way. He likes to cook. Will continue to follow while in hospital.  Harvel Ricks RN BSN CDE

## 2013-08-11 NOTE — Progress Notes (Signed)
Pt CBG 340. MD paged. MD to order night time coverage.

## 2013-08-11 NOTE — Progress Notes (Signed)
Inpatient Diabetes Program Recommendations  AACE/ADA: New Consensus Statement on Inpatient Glycemic Control (2013)  Target Ranges:  Prepandial:   less than 140 mg/dL      Peak postprandial:   less than 180 mg/dL (1-2 hours)      Critically ill patients:  140 - 180 mg/dL   Reason for Visit: Diabetes Coordinator consult  Diabetes history: Type 2? diabetes Outpatient Diabetes medications: Has not taken any medications for DM for 2 years Current orders for Inpatient glycemic control: IV insulin glucostabilizer   Note: Attempted to contact patient by phone X2.  Noted in history that patient has diabetes, but had not been taking medication for the past 2 years.  Admitted with elevated glucose after Urgent Care sent him to ED.  Patient remains on glucostabilizer.  When within range, will need to be transitioned with Lantus insulin 2 hours before stopping glucostabilizer and adding Novolog correction scale AC & HS at time of discontinuing drip.  Recommend Lantus 25-30 units daily and Novolog MODERATE correction scale AC & HS.  May need to titrate dosages.  Spoke with Staff RN taking care of patient.  Reminded nurse to cover CHO in clear liquid diet when still on glucostabilizer.  Patient may need more affordable form of insulin such as 70/30 mixed insulin at $25 per bottle if does not have insurance. Will attempt to talk with patient again.  Will continue to follow. Harvel Ricks RN BSN CDE

## 2013-08-12 LAB — GLUCOSE, CAPILLARY
Glucose-Capillary: 225 mg/dL — ABNORMAL HIGH (ref 70–99)
Glucose-Capillary: 237 mg/dL — ABNORMAL HIGH (ref 70–99)
Glucose-Capillary: 240 mg/dL — ABNORMAL HIGH (ref 70–99)
Glucose-Capillary: 249 mg/dL — ABNORMAL HIGH (ref 70–99)
Glucose-Capillary: 262 mg/dL — ABNORMAL HIGH (ref 70–99)

## 2013-08-12 LAB — BASIC METABOLIC PANEL
BUN: 11 mg/dL (ref 6–23)
CO2: 19 mEq/L (ref 19–32)
Calcium: 8.5 mg/dL (ref 8.4–10.5)
Chloride: 99 mEq/L (ref 96–112)
Creatinine, Ser: 0.82 mg/dL (ref 0.50–1.35)
GFR calc Af Amer: >90 mL/min (ref >90)
GFR calc non Af Amer: >90 mL/min (ref >90)
Glucose, Bld: 266 mg/dL — ABNORMAL HIGH (ref 70–99)
Potassium: 5 mEq/L (ref 3.7–5.3)
Sodium: 134 mEq/L — ABNORMAL LOW (ref 137–147)

## 2013-08-12 MED ORDER — ACCU-CHEK SOFT TOUCH LANCETS MISC: Status: DC

## 2013-08-12 MED ORDER — INSULIN NPH ISOPHANE & REGULAR (70-30) 100 UNIT/ML ~~LOC~~ SUSP: 10.0000 [IU] | Freq: Two times a day (BID) | SUBCUTANEOUS | Status: DC

## 2013-08-12 MED ORDER — INSULIN PEN NEEDLE 32G X 4 MM MISC: Status: DC

## 2013-08-12 MED ORDER — ACCU-CHEK NANO SMARTVIEW W/DEVICE KIT: 1.0000 | PACK | Freq: Every day | Status: DC

## 2013-08-12 NOTE — Progress Notes (Signed)
   CARE MANAGEMENT NOTE 08/12/2013  Patient:  Allen Gould, Allen Gould   Account Number:  [id number]  Date Initiated:  08/12/2013  Documentation initiated by:  [facility]  Subjective/Objective Assessment:   adm: polydipsia, polyuria, abdominal pain x 2 weeks     Action/Plan:   discharge planning   Anticipated DC Date:  08/12/2013   Anticipated DC Plan:  [place]  CM consult  [place] Program  Medication Assistance  PCP issues      Choice offered to / List presented to:             Status of service:  Completed, signed off Medicare Important Message given?   (If response is "NO", the following Medicare IM given date fields will be blank) Date Medicare IM given:   Date Additional Medicare IM given:    Discharge Disposition:  HOME/SELF CARE  Per UR Regulation:    If discussed at Long Length of Stay Meetings, dates discussed:    Comments:  08/12/13 10:30 CM gave pt [place] letter with list of participating and explained the [facility] program to pt.  Pt verbalized understanding of the [place] program.  CM gave pt [employer] handout for future generic diabetic/antiobiotic medication discounts.  Pt aware of [employer] $4 dollar list.  Pt's fiance has a [employer] glucometer and pt aware of price of meter and strips at [employer].  Pt stated [place] could not pursue orange card until he had proof of county residency.  Pt states the only identification  he has is a [employer]. driver's license though he no longer lives in [place].  [place] states he will pursue address change on license.  Pt was given [employer] [place] handout to pursue insurance/medicaid.  CM gave pt free clinic information for [facility] until he can get his residency established in [employer]  No other CM needs were communicated.  Mariane Masters, BSN, CM [phone].

## 2013-08-12 NOTE — Progress Notes (Signed)
Pt CBG is 262. MD notified. No new orders at this time; does not want Pt to drop too low. MD asked to recheck at 0300.Will continue to monitor.

## 2013-08-12 NOTE — Discharge Summary (Signed)
Name: Allen Gould MRN: [id number] DOB: 08/08/1965 48 y.o. PCP: No Pcp Per Patient  Date of Admission: 08/10/2013 10:42 AM Date of Discharge: 08/12/2013 Attending Physician: Bartholomew Crews, MD  Discharge Diagnosis: Principal Problem:   DKA (diabetic ketoacidoses) Active Problems:   Diabetes mellitus, type 2   Hypertension  Discharge Medications:   Medication List         ACCU-CHEK NANO SMARTVIEW W/DEVICE Kit  1 Device by Does not apply route daily.     accu-chek soft touch lancets  Use as instructed     insulin NPH-regular Human (70-30) 100 UNIT/ML injection  Commonly known as:  NOVOLIN 70/30  Inject 10 Units into the skin 2 (two) times daily with a meal.     Insulin Pen Needle 32G X 4 MM Misc  Commonly known as:  BD PEN NEEDLE NANO U/F  For CBG monitoring TID WC     metFORMIN 500 MG tablet  Commonly known as:  GLUCOPHAGE  Take 1 tablet (500 mg total) by mouth 2 (two) times daily with a meal.        Disposition and follow-up:   Allen Gould was discharged from [facility] in Stable condition.  At the hospital follow up visit please address:  1.  Compliance with insulin and possible need for titration, will need financial counselor and diabetes education  2.  Labs / imaging needed at time of follow-up: none  3.  Pending labs/ test needing follow-up: none   Follow-up Appointments: Follow-up Information   Follow up with Vertell Novak, MD On 08/21/2013. (9am)    Specialty:  Internal Medicine   Contact information:   [place] [address] [phone]       Discharge Instructions: Discharge Orders   Future Appointments Provider Department Dept Phone   08/21/2013 9:00 AM Olga Millers, MD [place] [phone]   Future Orders Complete By Expires   Call MD for:  persistant dizziness or light-headedness  As directed    Call MD for:  persistant nausea and vomiting  As directed    Call MD for:  temperature >100.4  As directed    Diet Carb Modified  As directed       Consultations:    Procedures Performed:  Dg Chest 2 View  08/10/2013   CLINICAL DATA:  Hyperglycemia  EXAM: CHEST  2 VIEW  COMPARISON:  07/09/2008  FINDINGS: Cardiomediastinal silhouette is unremarkable. No acute infiltrate or pleural effusion. No pulmonary edema. Bony thorax is unremarkable.  IMPRESSION: No active cardiopulmonary disease.   Electronically Signed   By: Lahoma Crocker M.D.   On: 08/10/2013 12:24   Admission HPI: Mr. Cerezo is a 48 year old man with history of DM2 (no A1C on record), HTN, asthma who presents with polydipsia, polyuria, abdominal pain x 2 weeks.  Patient states he has been drinking water and urinating non-stop for the past 2 weeks. He has also had intermittent abdominal pain and one episode of emesis 3 days ago that looked like food he had eaten, no blood. He sought medical attention at Urgent Care this morning where he was told his blood sugar was in high 500s and sent to ED. He has not had any medical follow-up in 3 years, has been treated with insulin in past but has not taken any medications including insulin in over 2 years. Patient reports one previous episode of DKA. Denies headache, chest pain, shortness of breath, nausea, dysuria, change  in bowel habits (though reported constipation to EDP).  Patient also complaining of chronic right shoulder pain that he believes is due to rotator cuff tear. States that his shoulder hurts all the time, not worsened with movement. He tried taking Aleve which helped some, but he stopped taking it when he felt it was no longer working.  In ED, labs consistent with DKA: BMP showed glucose 577, HCO3 23, anion gap 21. ABG revealed pH 7.377. + ketonuria. CBC showed WBC 9.8 (within normal limits). Patient was given 2L NS bolus, started on insulin gtt, and IMTS called for admission. Most recent CBG 383.    Hospital Course by problem list: #DKA resolved-  On admission, BMP showed glucose 577, HCO3 23, AG 21, K 5.6; + ketonuria. ABG showed pH 7.377, HCO3 21. Full infectious workup including CXR negative and UA negative. EKG and negative POC troponin. Pt had no other systemic signs of infection, remained afebrile, no tachycardia or tachypnea, no leucocytosis throughout admission. DKA was likely precipitated by lack of insulin for the past 2.5 years.  A1C 14.9%. Pt responded well to insulin gtt and was transitioned to subQ lantus in hospital. There is great concern of pt ability to afford insulin after discharge therefore was started on Novolin 70/30 10 units BID WC and metformin 534m BID as pt had very bad diarrhea when previously on higher doses. These both may need to be up titrated based on CBG monitoring. Pt met with Diabetes educator in hospital and care management that helped provide patient with glucose monitor and prescriptions.   #History of HTN- Pt was normotensive throughout admission and was not on home meds PTA. Pt states had hx but has not seen a physician in quite some time.   #History of asthma- No shortness of breath or wheezing during hospitalization. Doesn't have home inhalers may need at follow up if pt has symptoms.   Discharge Vitals:   BP 123/87  Pulse 85  Temp(Src) 98 F (36.7 C) (Oral)  Resp 18  Ht _0  (1.778 m)  Wt 293 lb 4.8 oz (133.04 kg)  BMI 42.08 kg/m2  SpO2 100%  Discharge Labs:  Results for orders placed during the hospital encounter of 08/10/13 (from the past 24 hour(s))  GLUCOSE, CAPILLARY     Status: Abnormal   Collection Time    08/11/13 10:39 AM      Result Value Ref Range   Glucose-Capillary 126 (*) 70 - 99 mg/dL  BASIC METABOLIC PANEL     Status: Abnormal   Collection Time    08/11/13 11:32 AM      Result Value Ref Range   Sodium 136 (*) 137 - 147 mEq/L   Potassium 4.0  3.7 - 5.3 mEq/L   Chloride 102  96 - 112 mEq/L   CO2 22  19 - 32 mEq/L   Glucose, Bld 129 (*) 70 - 99 mg/dL   BUN 13  6 - 23  mg/dL   Creatinine, Ser 0.78  0.50 - 1.35 mg/dL   Calcium 8.8  8.4 - 10.5 mg/dL   GFR calc non Af Amer >90  >90 mL/min   GFR calc Af Amer >90  >90 mL/min  GLUCOSE, CAPILLARY     Status: Abnormal   Collection Time    08/11/13 11:36 AM      Result Value Ref Range   Glucose-Capillary 135 (*) 70 - 99 mg/dL  GLUCOSE, CAPILLARY     Status: Abnormal   Collection Time  08/11/13 12:45 PM      Result Value Ref Range   Glucose-Capillary 147 (*) 70 - 99 mg/dL  BASIC METABOLIC PANEL     Status: Abnormal   Collection Time    08/11/13  1:27 PM      Result Value Ref Range   Sodium 135 (*) 137 - 147 mEq/L   Potassium 4.1  3.7 - 5.3 mEq/L   Chloride 99  96 - 112 mEq/L   CO2 23  19 - 32 mEq/L   Glucose, Bld 193 (*) 70 - 99 mg/dL   BUN 13  6 - 23 mg/dL   Creatinine, Ser 0.78  0.50 - 1.35 mg/dL   Calcium 8.9  8.4 - 10.5 mg/dL   GFR calc non Af Amer >90  >90 mL/min   GFR calc Af Amer >90  >90 mL/min  GLUCOSE, CAPILLARY     Status: Abnormal   Collection Time    08/11/13  1:41 PM      Result Value Ref Range   Glucose-Capillary 245 (*) 70 - 99 mg/dL  GLUCOSE, CAPILLARY     Status: Abnormal   Collection Time    08/11/13  2:44 PM      Result Value Ref Range   Glucose-Capillary 243 (*) 70 - 99 mg/dL  GLUCOSE, CAPILLARY     Status: Abnormal   Collection Time    08/11/13  3:32 PM      Result Value Ref Range   Glucose-Capillary 147 (*) 70 - 99 mg/dL  GLUCOSE, CAPILLARY     Status: Abnormal   Collection Time    08/11/13  4:44 PM      Result Value Ref Range   Glucose-Capillary 107 (*) 70 - 99 mg/dL  GLUCOSE, CAPILLARY     Status: Abnormal   Collection Time    08/11/13  9:06 PM      Result Value Ref Range   Glucose-Capillary 336 (*) 70 - 99 mg/dL   Comment 1 Documented in Chart     Comment 2 Notify RN    GLUCOSE, CAPILLARY     Status: Abnormal   Collection Time    08/11/13 10:22 PM      Result Value Ref Range   Glucose-Capillary 340 (*) 70 - 99 mg/dL  GLUCOSE, CAPILLARY     Status:  Abnormal   Collection Time    08/11/13 11:46 PM      Result Value Ref Range   Glucose-Capillary 297 (*) 70 - 99 mg/dL   Comment 1 Documented in Chart     Comment 2 Notify RN    GLUCOSE, CAPILLARY     Status: Abnormal   Collection Time    08/12/13 12:54 AM      Result Value Ref Range   Glucose-Capillary 262 (*) 70 - 99 mg/dL  GLUCOSE, CAPILLARY     Status: Abnormal   Collection Time    08/12/13  3:01 AM      Result Value Ref Range   Glucose-Capillary 240 (*) 70 - 99 mg/dL   Comment 1 Documented in Chart     Comment 2 Notify RN    GLUCOSE, CAPILLARY     Status: Abnormal   Collection Time    08/12/13  4:06 AM      Result Value Ref Range   Glucose-Capillary 225 (*) 70 - 99 mg/dL  GLUCOSE, CAPILLARY     Status: Abnormal   Collection Time    08/12/13  6:04 AM  Result Value Ref Range   Glucose-Capillary 249 (*) 70 - 99 mg/dL  BASIC METABOLIC PANEL     Status: Abnormal   Collection Time    08/12/13  6:44 AM      Result Value Ref Range   Sodium 134 (*) 137 - 147 mEq/L   Potassium 5.0  3.7 - 5.3 mEq/L   Chloride 99  96 - 112 mEq/L   CO2 19  19 - 32 mEq/L   Glucose, Bld 266 (*) 70 - 99 mg/dL   BUN 11  6 - 23 mg/dL   Creatinine, Ser 0.82  0.50 - 1.35 mg/dL   Calcium 8.5  8.4 - 10.5 mg/dL   GFR calc non Af Amer >90  >90 mL/min   GFR calc Af Amer >90  >90 mL/min  GLUCOSE, CAPILLARY     Status: Abnormal   Collection Time    08/12/13  7:36 AM      Result Value Ref Range   Glucose-Capillary 237 (*) 70 - 99 mg/dL    Signed: Clinton Gallant, MD 08/12/2013, 10:03 AM   Time Spent on Discharge: 35 minutes Services Ordered on Discharge: none Equipment Ordered on Discharge: none

## 2013-08-12 NOTE — Progress Notes (Signed)
Subjective: Pt had NAEON. Tolerated regular diet well. Able to use insulin w/o complication. Pt ready to go home. Wife is very supportive and also a diabetic and will help patient make lifestyle changes.   Objective: Vital signs in last 24 hours: Filed Vitals:   08/11/13 0620 08/11/13 1412 08/11/13 2100 08/12/13 0550  BP: 128/86 119/78 130/86 123/87  Pulse: 71 78 81 85  Temp: 98 F (36.7 C) 98.2 F (36.8 C) 98 F (36.7 C) 98 F (36.7 C)  TempSrc: Oral Oral Oral Oral  Resp: 18 18 18 18   Height:      Weight:      SpO2: 97% 97% 96% 100%   Weight change:   Intake/Output Summary (Last 24 hours) at 08/12/13 6283 Last data filed at 08/11/13 1816  Gross per 24 hour  Intake 2706.25 ml  Output    250 ml  Net 2456.25 ml   PEX General: alert, cooperative, and in no apparent distress HEENT: NCAT, vision grossly intact, oropharynx clear and non-erythematous  Neck: supple, no lymphadenopathy Lungs: clear to ascultation bilaterally, normal work of respiration, no wheezes, rales, ronchi Heart: regular rate and rhythm, no murmurs, gallops, or rubs Abdomen: soft, non-tender, non-distended, normal bowel sounds Extremities: 2+ DP/PT pulses bilaterally, no cyanosis, clubbing, or edema Neurologic: alert & oriented X3, cranial nerves II-XII intact, strength grossly intact, sensation intact to light touch  Lab Results: Basic Metabolic Panel:  Recent Labs Lab 08/11/13 1132 08/11/13 1327  NA 136* 135*  K 4.0 4.1  CL 102 99  CO2 22 23  GLUCOSE 129* 193*  BUN 13 13  CREATININE 0.78 0.78  CALCIUM 8.8 8.9   Liver Function Tests:  Recent Labs Lab 08/10/13 1012  AST 18  ALT 16  ALKPHOS 99  BILITOT 0.3  PROT 8.2  ALBUMIN 3.9   CBC:  Recent Labs Lab 08/10/13 0913 08/10/13 1012  WBC  --  9.8  HGB 16.7 15.7  HCT 49.0 42.4  MCV  --  90.8  PLT  --  223   CBG:  Recent Labs Lab 08/11/13 2222 08/11/13 2346 08/12/13 0054 08/12/13 0301 08/12/13 0406 08/12/13 0604   GLUCAP 340* 297* 262* 240* 225* 249*   Urinalysis:  Recent Labs Lab 08/10/13 0919 08/10/13 1240  COLORURINE  --  YELLOW  LABSPEC <=1.005 1.039*  PHURINE 5.5 5.0  GLUCOSEU >=1000* >1000*  HGBUR TRACE* NEGATIVE  BILIRUBINUR NEGATIVE NEGATIVE  KETONESUR 40* 40*  PROTEINUR NEGATIVE NEGATIVE  UROBILINOGEN 0.2 0.2  NITRITE NEGATIVE NEGATIVE  LEUKOCYTESUR NEGATIVE NEGATIVE   Studies/Results: Dg Chest 2 View  08/10/2013   CLINICAL DATA:  Hyperglycemia  EXAM: CHEST  2 VIEW  COMPARISON:  07/09/2008  FINDINGS: Cardiomediastinal silhouette is unremarkable. No acute infiltrate or pleural effusion. No pulmonary edema. Bony thorax is unremarkable.  IMPRESSION: No active cardiopulmonary disease.   Electronically Signed   By: Lahoma Crocker M.D.   On: 08/10/2013 12:24   Medications: I have reviewed the patient's current medications. Scheduled Meds: . enoxaparin (LOVENOX) injection  40 mg Subcutaneous Q24H  . insulin aspart  0-9 Units Subcutaneous TID WC  . insulin glargine  10 Units Subcutaneous QHS  . living well with diabetes book   Does not apply Once   Continuous Infusions: . sodium chloride 100 mL/hr at 08/12/13 0130  . insulin (NOVOLIN-R) infusion 4.4 Units/hr (08/11/13 1544)   PRN Meds:.acetaminophen, acetaminophen, albuterol, dextrose Assessment/Plan: #DKA resolved- On admission, BMP showed glucose 577, HCO3 23, AG 21, K 5.6; + ketonuria. ABG  showed pH 7.377, HCO3 21.  No evidence of infection, afebrile, no tachycardia or tachypnea, no leucocytosis. CXR negative, UA negative for infection. DKA was likely precipitated by lack of insulin for the past 2.5 years days.  A1C 14.9%.  Overnight, AG 15 x 3 --> 12 >>12.  He is feeling much better since fluids and insulin treatment. .   -monitor K and replete as necessary  -cont Lantus 10 units qhs and SSI -consult to diabetes educator   #History of HTN- Currently normotensive on no meds.  -continue to monitor   #History of asthma- No  shortness of breath or wheezing, on no home meds.  -continue to monitor   Dispo: Disposition is deferred at this time, awaiting improvement of current medical problems.  Anticipated discharge in approximately 1 day(s).   The patient does not have a current PCP (No Pcp Per Patient) and does need an [facility] hospital follow-up appointment after discharge.   .Services Needed at time of discharge: Y = Yes, Blank = No PT:   OT:   RN:   Equipment:   Other:     LOS: 2 days   Clinton Gallant, MD 08/12/2013, 7:22 AM

## 2013-08-12 NOTE — Progress Notes (Signed)
08/12/13 Patient discharged home today. IV site removed and discharge instructions reviewed  And diabetic instructions  Feedback from patient.

## 2013-08-21 ENCOUNTER — Ambulatory Visit: Payer: Self-pay | Admitting: Internal Medicine

## 2013-08-27 ENCOUNTER — Telehealth: Payer: Self-pay | Admitting: *Deleted

## 2013-08-27 DIAGNOSIS — E119 Type 2 diabetes mellitus without complications: Secondary | ICD-10-CM

## 2013-08-27 MED ORDER — ACCU-CHEK FASTCLIX LANCETS MISC: Status: DC

## 2013-08-27 MED ORDER — ACCU-CHEK SMARTVIEW VI STRP: ORAL_STRIP | Status: DC

## 2013-08-27 MED ORDER — "INSULIN SYRINGE-NEEDLE U-100 31G X 15/64"" 0.3 ML MISC": Status: DC

## 2013-08-27 NOTE — Telephone Encounter (Signed)
Needs diabetes supplies

## 2013-08-29 ENCOUNTER — Encounter: Payer: Self-pay | Admitting: Internal Medicine

## 2013-08-29 ENCOUNTER — Ambulatory Visit (INDEPENDENT_AMBULATORY_CARE_PROVIDER_SITE_OTHER): Payer: Self-pay | Admitting: Internal Medicine

## 2013-08-29 VITALS — BP 152/103 | HR 74 | Temp 97.4°F | Ht 70.0 in | Wt 314.5 lb

## 2013-08-29 DIAGNOSIS — I10 Essential (primary) hypertension: Secondary | ICD-10-CM

## 2013-08-29 DIAGNOSIS — M25511 Pain in right shoulder: Secondary | ICD-10-CM | POA: Insufficient documentation

## 2013-08-29 DIAGNOSIS — J45909 Unspecified asthma, uncomplicated: Secondary | ICD-10-CM

## 2013-08-29 DIAGNOSIS — E119 Type 2 diabetes mellitus without complications: Secondary | ICD-10-CM

## 2013-08-29 DIAGNOSIS — G4700 Insomnia, unspecified: Secondary | ICD-10-CM | POA: Insufficient documentation

## 2013-08-29 DIAGNOSIS — M25519 Pain in unspecified shoulder: Secondary | ICD-10-CM

## 2013-08-29 HISTORY — DX: Pain in right shoulder: M25.511

## 2013-08-29 HISTORY — DX: Insomnia, unspecified: G47.00

## 2013-08-29 HISTORY — DX: Morbid (severe) obesity due to excess calories: E66.01

## 2013-08-29 LAB — GLUCOSE, CAPILLARY: Glucose-Capillary: 114 mg/dL — ABNORMAL HIGH (ref 70–99)

## 2013-08-29 MED ORDER — LISINOPRIL 5 MG PO TABS: 5.0000 mg | ORAL_TABLET | Freq: Every day | ORAL | Status: DC

## 2013-08-29 MED ORDER — INSULIN NPH ISOPHANE & REGULAR (70-30) 100 UNIT/ML ~~LOC~~ SUSP: SUBCUTANEOUS | Status: DC

## 2013-08-29 NOTE — Assessment & Plan Note (Signed)
Could be MSK i.e tendonopathy Will consider imaging once pt gets insurance and referral to sports medicine Continue Advil 400 mg bid tid to qid prn for now

## 2013-08-29 NOTE — Assessment & Plan Note (Signed)
disc'ed wt loss. Pt is exercising to lose Given info

## 2013-08-29 NOTE — Assessment & Plan Note (Signed)
Lab Results  Component Value Date   HGBA1C 14.9* 08/10/2013     Assessment: Diabetes control: poor control (HgbA1C >9%) Progress toward A1C goal:  unable to assess Comments: cbgs at home in the am150s-320s. PM cbgs 130s-340s.  Plan: Medications:  increase NPH 10 u bid to 10u am and 15 units pm. continue Metformin 500 mg bid Home glucose monitoring: Frequency: 3 times a day Timing: before meals Instruction/counseling given: reminded to get eye exam, reminded to bring blood glucose meter & log to each visit, reminded to bring medications to each visit, discussed foot care, discussed the need for weight loss, discussed diet and provided printed educational material Educational resources provided: brochure;other (see comments) Self management tools provided: other (see comments) Other plans: f/u in 2-4 weeks.  Needs eye exam, urine microAlb/Cr. Meet with Barry Brunner pending orange card. Met with Marlana Latus today.  Foot exam done today.  Needs lipid panel future as well

## 2013-08-29 NOTE — Progress Notes (Signed)
   Subjective:    Patient ID: Allen Gould, male    DOB: [birth year], 48 y.o.   MRN: [id number]  HPI Comments: 48 y.o Past Medical History Diabetes mellitus 2 without complication, Hypertension (not on BP meds since 3 years ago; BP 150/105>154/112>152/103), h/o DKA, Asthma (not taking Albuterol), FH of Heart disease, GERD (gastroesophageal reflux disease), obesity (he has lost 100 lbs walking 4-5 miles and exercising)                 He presents for f/u: 1) HFU for DKA 3/20-3/22/15.  HA1C was 14.9 on 08/10/13.  cbg today is 114. He is taking NPH 10 units bid and Metformin 500 bid (no diarrhea 2) R shoulder pain x 1 year. Throbbing 8/10 pain. He wears a compression band on upper arm which helps. Pain keeps him up at night. He takes Ibuprofen 400 mg bid prn and it is not helping.  He has not tried Tylenol b/c it gives him nosebleeds.   3) Decreased sleep at night. He has trouble falling and staying asleep. He sleeps from 11 P-4A.  He drinks caffeine in the am but does not drink soda.  He does watch TV and sometimes sleeps with the TV on at night but his fiance does not like him to do that.  He reports snoring at night as well.   4) H/o asthma-he is not using albuterol inhaler and just got over a cold  SH: h/o felonies; works for [employer], engaged to be married, 1 son age 52, smokes 1 cig/day, interested in truck driving      Review of Systems  Respiratory: Negative for shortness of breath.   Cardiovascular: Negative for chest pain.  Gastrointestinal: Negative for abdominal pain and constipation.  Musculoskeletal: Positive for arthralgias.  Psychiatric/Behavioral: Positive for sleep disturbance.       Objective:   Physical Exam  Nursing note and vitals reviewed. Constitutional: He is oriented to person, place, and time. He appears well-developed and well-nourished. He is cooperative.  HENT:  Head: Normocephalic and atraumatic.    Mouth/Throat: Oropharynx is clear and moist and  mucous membranes are normal. No oropharyngeal exudate.  Eyes: Conjunctivae are normal. Pupils are equal, round, and reactive to light. Right eye exhibits no discharge. Left eye exhibits no discharge. No scleral icterus.  Cardiovascular: Normal rate, regular rhythm, S1 normal, S2 normal and normal heart sounds.   No murmur heard. Pulses:      Dorsalis pedis pulses are 2+ on the right side, and 2+ on the left side.       Posterior tibial pulses are 2+ on the right side, and 2+ on the left side.  No lower ext edema  Pulmonary/Chest: Effort normal. He has wheezes.  Abdominal: Soft. Bowel sounds are normal. He exhibits no distension. There is no tenderness.  Obese ab  Musculoskeletal: He exhibits no edema.       Arms: Not able to scratch his back but able to lift arm almost straight in the air   Neurological: He is alert and oriented to person, place, and time. Gait normal.  Skin: Skin is warm and dry. No rash noted.  Psychiatric: He has a normal mood and affect. His speech is normal and behavior is normal. Judgment and thought content normal. Cognition and memory are normal.          Assessment & Plan:  F/u in 2-4 weeks

## 2013-08-29 NOTE — Assessment & Plan Note (Signed)
BP Readings from Last 3 Encounters:  08/29/13 152/103  08/12/13 123/87  08/10/13 137/79    Lab Results  Component Value Date   NA 134* 08/12/2013   K 5.0 08/12/2013   CREATININE 0.82 08/12/2013    Assessment: Blood pressure control: moderately elevated Progress toward BP goal:  unable to assess Comments: none  Plan: Medications:  started Lisinopril 5 mg daily  Educational resources provided: brochure;other (see comments) Self management tools provided: home blood pressure logbook;other (see comments) Other plans: f/u in 2-4 weeks

## 2013-08-29 NOTE — Patient Instructions (Addendum)
General Instructions: Please meet with financial counselor and diabetes educations  You need to get insurance. Once you do we will check your cholesterol, eye exam, and urine for protein Read all the info below Please try to bring all your medicines next time. This helps [place] take good care of you and stops mistakes from medicines that could hurt you. Please bring in your meter  Follow up in 11/2013. Make sure you do all the above before the next visit  Take 10 units in the morning and 15 units at night  Take Lisinopril 5 mg daily  You can Take Advil (Ibuprofen) 400 mg three to four times a day if needed.    Treatment Goals:  Goals (1 Years of Data) as of 08/29/13         As of Today 08/12/13 08/11/13 08/11/13 08/11/13     Blood Pressure    . Blood Pressure < 140/90  150/105 123/87 130/86 119/78 128/86     Result Component    . HEMOGLOBIN A1C < 7.0            Progress Toward Treatment Goals:  Treatment Goal 08/29/2013  Hemoglobin A1C unable to assess  Blood pressure unable to assess  Stop smoking smoking the same amount    Self Care Goals & Plans:  Self Care Goal 08/29/2013  Manage my medications take my medicines as prescribed; bring my medications to every visit; refill my medications on time; follow the sick day instructions if I am sick  Monitor my health keep track of my blood glucose; bring my glucose meter and log to each visit; keep track of my blood pressure; bring my blood pressure log to each visit; keep track of my weight; check my feet daily  Eat healthy foods drink diet soda or water instead of juice or soda; eat more vegetables; eat foods that are low in salt; eat baked foods instead of fried foods; eat fruit for snacks and desserts; eat smaller portions  Be physically active find an activity I enjoy  Stop smoking call [facility] (1-800-QUIT-NOW)  Meeting treatment goals maintain the current self-care plan    Home Blood Glucose Monitoring 08/29/2013  Check my blood sugar  3 times a day  When to check my blood sugar before meals     Care Management & Community Referrals:  Referral 08/29/2013  Referrals made for care management support none needed  Referrals made to community resources none       Diabetic Ketoacidosis Diabetic ketoacidosis (DKA) is a life-threatening complication of type 1 diabetes. It must be quickly recognized and treated. Treatment requires hospitalization. CAUSES  When there is no insulin in the body, glucose (sugar) cannot be used and the body breaks down fat for energy. When fat breaks down, acids (ketones) build up in the blood. Very high levels of glucose and high levels of acids lead to severe loss of body fluids (dehydration) and other dangerous chemical changes. This stresses your vital organs and can cause coma or death. SYMPTOMS   Tiredness (fatigue).  Weight loss.  Excessive thirst.  Ketones in the urine.  Lightheadedness.  Fruity or sweet smell on your breath.  Excessive urination.  Visual changes.  Confusion or irritability.  Feeling sick to your stomach (nauseous) or vomiting.  Rapid breathing.  Stomachache or belly (abdominal) pain. DIAGNOSIS  Your caregiver will diagnose DKA based on your history, physical exam, and blood tests. Your caregiver will check if there is another illness present which caused you  to go into DKA. Most of this will be done quickly in an emergency room. TREATMENT   Fluid replacement to correct dehydration.  Insulin.  Correction of electrolytes, such as potassium and sodium.  Medicines (antibiotics) that kill germs for infections. PREVENTION  Always take your insulin. Do not skip your insulin injections.  If you are ill, treat yourself quickly. Your body often needs more insulin to fight the illness.  Check your blood glucose regularly.  Check urine ketones if your blood glucose is greater than 240 milligrams per deciliter (mg/dl).  Do not used expired or outdated  insulin.  If your blood glucose is high, drink plenty of fluids. This helps flush out ketones. HOME CARE INSTRUCTIONS   If you are ill, follow the advice of your caregiver.  To prevent loss of body fluids (dehydration), drink enough water and fluids to keep your urine clear or pale yellow.  If you cannot eat, alternate between drinking fluids with sugar (soda, juices, flavored gelatin) and salty fluids (broth, bouillon).  If you can eat, follow your usual diet and drink sugar-free liquids (water, diet drinks).  Always take your usual dose of insulin. If you cannot eat, or your glucose is getting too low, call your caregiver for further instructions.  Continue to monitor your blood or urine ketones every 3 to 4 hours around the clock. Set your alarm clock or have someone wake you up. If you are too sick, have someone test it for you.  Rest and avoid exercise. SEEK MEDICAL CARE IF:   You have ketones in your urine or your blood glucose is higher than a level your caregiver suggests. You may need extra insulin. Call your caregiver if you need advice on adjusting your insulin.  You cannot drink at least a tablespoon of fluid every 15 to 20 minutes.  You have been throwing up for more than 2 hours.  You have symptoms of DKA:  Fruity smelling breath.  Breathing faster or slower.  Becoming very sleepy. SEEK IMMEDIATE MEDICAL CARE IF:   You have signs of dehydration:  Decreased urination.  Increased thirst.  Dry skin and mouth.  Lightheadedness.  Your blood glucose is very high (as advised by your caregiver) twice in a row.  You or your child has an oral temperature above 102 F (38.9 C), not controlled by medicine.  You pass out.  You have chest pain and/or trouble breathing.  You have a sudden, severe headache.  You have sudden weakness in one arm and/or one leg.  You have sudden difficulty speaking and/or swallowing.  You develop vomiting and/or diarrhea that is  getting worse after 3 to 4 hours.  You have abdominal pain. MAKE SURE YOU:   Understand these instructions.  Will watch your condition.  Will get help right away if you are not doing well or get worse. Document Released: 05/07/2000 Document Revised: 08/02/2011 Document Reviewed: 11/13/2008 [facility] Patient Information 2014 [place], [place].  Smoking Cessation Quitting smoking is important to your health and has many advantages. However, it is not always easy to quit since nicotine is a very addictive drug. Often times, people try 3 times or more before being able to quit. This document explains the best ways for you to prepare to quit smoking. Quitting takes hard work and a lot of effort, but you can do it. ADVANTAGES OF QUITTING SMOKING  You will live longer, feel better, and live better.  Your body will feel the impact of quitting smoking almost immediately.  Within 20 minutes, blood pressure decreases. Your pulse returns to its normal level.  After 8 hours, carbon monoxide levels in the blood return to normal. Your oxygen level increases.  After 24 hours, the chance of having a heart attack starts to decrease. Your breath, hair, and body stop smelling like smoke.  After 48 hours, damaged nerve endings begin to recover. Your sense of taste and smell improve.  After 72 hours, the body is virtually free of nicotine. Your bronchial tubes relax and breathing becomes easier.  After 2 to 12 weeks, lungs can hold more air. Exercise becomes easier and circulation improves.  The risk of having a heart attack, stroke, cancer, or lung disease is greatly reduced.  After 1 year, the risk of coronary heart disease is cut in half.  After 5 years, the risk of stroke falls to the same as a nonsmoker.  After 10 years, the risk of lung cancer is cut in half and the risk of other cancers decreases significantly.  After 15 years, the risk of coronary heart disease drops, usually to the level of  a nonsmoker.  If you are pregnant, quitting smoking will improve your chances of having a healthy baby.  The people you live with, especially any children, will be healthier.  You will have extra money to spend on things other than cigarettes. QUESTIONS TO THINK ABOUT BEFORE ATTEMPTING TO QUIT You may want to talk about your answers with your caregiver.  Why do you want to quit?  If you tried to quit in the past, what helped and what did not?  What will be the most difficult situations for you after you quit? How will you plan to handle them?  Who can help you through the tough times? Your family? Friends? A caregiver?  What pleasures do you get from smoking? What ways can you still get pleasure if you quit? Here are some questions to ask your caregiver:  How can you help me to be successful at quitting?  What medicine do you think would be best for me and how should I take it?  What should I do if I need more help?  What is smoking withdrawal like? How can I get information on withdrawal? GET READY  Set a quit date.  Change your environment by getting rid of all cigarettes, ashtrays, matches, and lighters in your home, car, or work. Do not let people smoke in your home.  Review your past attempts to quit. Think about what worked and what did not. GET SUPPORT AND ENCOURAGEMENT You have a better chance of being successful if you have help. You can get support in many ways.  Tell your family, friends, and co-workers that you are going to quit and need their support. Ask them not to smoke around you.  Get individual, group, or telephone counseling and support. Programs are available at [employer] and health centers. Call your local health department for information about programs in your area.  Spiritual beliefs and practices may help some smokers quit.  Download a "quit meter" on your computer to keep track of quit statistics, such as how long you have gone without  smoking, cigarettes not smoked, and money saved.  Get a self-help book about quitting smoking and staying off of tobacco. [place] yourself from urges to smoke. Talk to someone, go for a walk, or occupy your time with a task.  Change your normal routine. Take a different route to  work. Drink tea instead of coffee. Eat breakfast in a different place.  Reduce your stress. Take a hot bath, exercise, or read a book.  Plan something enjoyable to do every day. Reward yourself for not smoking.  Explore interactive web-based programs that specialize in helping you quit. GET MEDICINE AND USE IT CORRECTLY Medicines can help you stop smoking and decrease the urge to smoke. Combining medicine with the above behavioral methods and support can greatly increase your chances of successfully quitting smoking.  Nicotine replacement therapy helps deliver nicotine to your body without the negative effects and risks of smoking. Nicotine replacement therapy includes nicotine gum, lozenges, inhalers, nasal sprays, and skin patches. Some may be available over-the-counter and others require a prescription.  Antidepressant medicine helps people abstain from smoking, but how this works is unknown. This medicine is available by prescription.  Nicotinic receptor partial agonist medicine simulates the effect of nicotine in your brain. This medicine is available by prescription. Ask your caregiver for advice about which medicines to use and how to use them based on your health history. Your caregiver will tell you what side effects to look out for if you choose to be on a medicine or therapy. Carefully read the information on the package. Do not use any other product containing nicotine while using a nicotine replacement product.  RELAPSE OR DIFFICULT SITUATIONS Most relapses occur within the first 3 months after quitting. Do not be discouraged if you start smoking again. Remember, most  people try several times before finally quitting. You may have symptoms of withdrawal because your body is used to nicotine. You may crave cigarettes, be irritable, feel very hungry, cough often, get headaches, or have difficulty concentrating. The withdrawal symptoms are only temporary. They are strongest when you first quit, but they will go away within 10 14 days. To reduce the chances of relapse, try to:  Avoid drinking alcohol. Drinking lowers your chances of successfully quitting.  Reduce the amount of caffeine you consume. Once you quit smoking, the amount of caffeine in your body increases and can give you symptoms, such as a rapid heartbeat, sweating, and anxiety.  Avoid smokers because they can make you want to smoke.  Do not let weight gain distract you. Many smokers will gain weight when they quit, usually less than 10 pounds. Eat a healthy diet and stay active. You can always lose the weight gained after you quit.  Find ways to improve your mood other than smoking. FOR MORE INFORMATION  www.smokefree.gov  Document Released: 05/04/2001 Document Revised: 11/09/2011 Document Reviewed: 08/19/2011 [facility] Patient Information 2014 [place], [place].  DASH Diet The DASH diet stands for "Dietary Approaches to Stop Hypertension." It is a healthy eating plan that has been shown to reduce high blood pressure (hypertension) in as little as 14 days, while also possibly providing other significant health benefits. These other health benefits include reducing the risk of breast cancer after menopause and reducing the risk of type 2 diabetes, heart disease, colon cancer, and stroke. Health benefits also include weight loss and slowing kidney failure in patients with chronic kidney disease.  DIET GUIDELINES  Limit salt (sodium). Your diet should contain less than 1500 mg of sodium daily.  Limit refined or processed carbohydrates. Your diet should include mostly whole grains. Desserts and added  sugars should be used sparingly.  Include small amounts of heart-healthy fats. These types of fats include nuts, oils, and tub margarine. Limit saturated and trans fats. These fats have been shown  to be harmful in the body. CHOOSING FOODS  The following food groups are based on a 2000 calorie diet. See your Registered Dietitian for individual calorie needs. Grains and Grain Products (6 to 8 servings daily)  Eat More Often: Whole-wheat bread, brown rice, whole-grain or wheat pasta, quinoa, popcorn without added fat or salt (air popped).  Eat Less Often: White bread, white pasta, white rice, cornbread. Vegetables (4 to 5 servings daily)  Eat More Often: Fresh, frozen, and canned vegetables. Vegetables may be raw, steamed, roasted, or grilled with a minimal amount of fat.  Eat Less Often/Avoid: Creamed or fried vegetables. Vegetables in a cheese sauce. Fruit (4 to 5 servings daily)  Eat More Often: All fresh, canned (in natural juice), or frozen fruits. Dried fruits without added sugar. One hundred percent fruit juice ( cup [237 mL] daily).  Eat Less Often: Dried fruits with added sugar. Canned fruit in light or heavy syrup. [employer], Fish, and Poultry (2 servings or less daily. One serving is 3 to 4 oz [85-114 g]).  Eat More Often: Ninety percent or leaner ground beef, tenderloin, sirloin. Round cuts of beef, chicken breast, [place] breast. All fish. Grill, bake, or broil your meat. Nothing should be fried.  Eat Less Often/Avoid: Fatty cuts of meat, [place], or chicken leg, thigh, or wing. Fried cuts of meat or fish. Dairy (2 to 3 servings)  Eat More Often: Low-fat or fat-free milk, low-fat plain or light yogurt, reduced-fat or part-skim cheese.  Eat Less Often/Avoid: Milk (whole, 2%).Whole milk yogurt. Full-fat cheeses. Nuts, Seeds, and Legumes (4 to 5 servings per week)  Eat More Often: All without added salt.  Eat Less Often/Avoid: Salted nuts and seeds, canned beans with added  salt. Fats and Sweets (limited)  Eat More Often: Vegetable oils, tub margarines without trans fats, sugar-free gelatin. Mayonnaise and salad dressings.  Eat Less Often/Avoid: Coconut oils, palm oils, butter, stick margarine, cream, half and half, cookies, candy, pie. FOR MORE INFORMATION The Dash Diet Eating Plan: www.dashdiet.org Document Released: 04/29/2011 Document Revised: 08/02/2011 Document Reviewed: 04/29/2011 [facility] Patient Information 2014 [place], [place].  Hypertension As your heart beats, it forces blood through your arteries. This force is your blood pressure. If the pressure is too high, it is called hypertension (HTN) or high blood pressure. HTN is dangerous because you may have it and not know it. High blood pressure may mean that your heart has to work harder to pump blood. Your arteries may be narrow or stiff. The extra work puts you at risk for heart disease, stroke, and other problems.  Blood pressure consists of two numbers, a higher number over a lower, 110/72, for example. It is stated as "110 over 72." The ideal is below 120 for the top number (systolic) and under 80 for the bottom (diastolic). Write down your blood pressure today. You should pay close attention to your blood pressure if you have certain conditions such as:  Heart failure.  Prior heart attack.  Diabetes  Chronic kidney disease.  Prior stroke.  Multiple risk factors for heart disease. To see if you have HTN, your blood pressure should be measured while you are seated with your arm held at the level of the heart. It should be measured at least twice. A one-time elevated blood pressure reading (especially in the Emergency Department) does not mean that you need treatment. There may be conditions in which the blood pressure is different between your right and left arms. It is important to see  your caregiver soon for a recheck. Most people have essential hypertension which means that there is not a  specific cause. This type of high blood pressure may be lowered by changing lifestyle factors such as:  Stress.  Smoking.  Lack of exercise.  Excessive weight.  Drug/tobacco/alcohol use.  Eating less salt. Most people do not have symptoms from high blood pressure until it has caused damage to the body. Effective treatment can often prevent, delay or reduce that damage. TREATMENT  When a cause has been identified, treatment for high blood pressure is directed at the cause. There are a large number of medications to treat HTN. These fall into several categories, and your caregiver will help you select the medicines that are best for you. Medications may have side effects. You should review side effects with your caregiver. If your blood pressure stays high after you have made lifestyle changes or started on medicines,   Your medication(s) may need to be changed.  Other problems may need to be addressed.  Be certain you understand your prescriptions, and know how and when to take your medicine.  Be sure to follow up with your caregiver within the time frame advised (usually within two weeks) to have your blood pressure rechecked and to review your medications.  If you are taking more than one medicine to lower your blood pressure, make sure you know how and at what times they should be taken. Taking two medicines at the same time can result in blood pressure that is too low. SEEK IMMEDIATE MEDICAL CARE IF:  You develop a severe headache, blurred or changing vision, or confusion.  You have unusual weakness or numbness, or a faint feeling.  You have severe chest or abdominal pain, vomiting, or breathing problems. MAKE SURE YOU:   Understand these instructions.  Will watch your condition.  Will get help right away if you are not doing well or get worse. Document Released: 05/10/2005 Document Revised: 08/02/2011 Document Reviewed: 12/29/2007 [facility] Patient Information 2014  [place].  Type 2 Diabetes Mellitus, Adult Type 2 diabetes mellitus is a long-term (chronic) disease. In type 2 diabetes:  The pancreas does not make enough of a hormone called insulin.  The cells in the body do not respond as well to the insulin that is made.  Both of the above can happen. Normally, insulin moves sugars from food into tissue cells. This gives you energy. If you have type 2 diabetes, sugars cannot be moved into tissue cells. This causes high blood sugar (hyperglycemia).  HOME CARE  Have your hemoglobin A1c level checked twice a year. The level shows if your diabetes is under control or out of control.  Perform daily blood sugar testing as told by your doctor.  Check your ketone levels by testing your pee (urine) when you are sick and as told.  Take your diabetes or insulin medicine as told by your doctor.  Never run out of insulin.  Adjust how much insulin you give yourself based on how many carbs (carbohydrates) you eat. Carbs are in many foods, such as fruits, vegetables, whole grains, and dairy products.  Have a healthy snack between every healthy meal. Have 3 meals and 3 snacks a day.  Lose weight if you are overweight.  Carry a medical alert card or wear your medical alert jewelry.  Carry a 15 gram carb snack with you at all times. Examples include:  Glucose pills, 3 or 4.  Glucose gel, 15 gram tube.  Raisins,  2 tablespoons (24 grams).  Jelly beans, 6.  Animal crackers, 8.  Sugar pop, 4 ounces (120 milliliters).  Gummy treats, 9.  Notice low blood sugar (hypoglycemia) symptoms, such as:  Shaking (tremors).  Decreased ability to think clearly.  Sweating.  Increased heart rate.  Headache.  Dry mouth.  Hunger.  Crabbiness (irritability).  Being worried or tense (anxiety).  Restless sleep.  A change in speech or coordination.  Confusion.  Treat low blood sugar right away. If you are alert and can swallow, follow the 15:15  rule:  Take 15 20 grams of a rapid-acting glucose or carb. This includes glucose gel, glucose pills, or 4 ounces (120 milliliters) of fruit juice, regular pop, or low-fat milk.  Check your blood sugar level after taking the glucose.  Take 15 20 grams of more glucose if the repeat blood sugar level is still 70 mg/dL (milligrams/deciliter) or below.  Eat a meal or snack within 1 hour of the blood sugar levels going back to normal.  Notice early symptoms of high blood sugar, such as:  Being really thirsty or drinking a lot (polydipsia).  Peeing (urinating) a lot (polyuria).  Do at least 150 minutes of physical activity a week or as told.  Split the 150 minutes of activity up during the week. Do not do 150 minutes of activity in one day.  Perform exercises, such as weight lifting, at least 2 times a week or as told.  Adjust your insulin or food intake as needed if you start a new exercise or sport.  Follow your sick day plan when you are not able to eat or drink as usual.  Avoid tobacco use.  Women who are not pregnant should drink no more than 1 drink a day. Men should drink no more than 2 drinks a day.  Only drink alcohol with food.  Ask your doctor if alcohol is safe for you.  Tell your doctor if you drink alcohol several times during the week.  See your doctor regularly.  Schedule an eye exam soon after you are diagnosed with diabetes. Schedule exams once every year.  Check your skin and feet every day. Check for cuts, bruises, redness, nail problems, bleeding, blisters, or sores. A doctor should do a foot exam once a year.  Brush your teeth and gums twice a day. Floss once a day. Visit your dentist regularly.  Share your diabetes plan with your workplace or school.  Stay up-to-date with shots that fight against diseases (immunizations).  Learn how to manage stress.  Get diabetes education and support as needed.  Ask your doctor for special help if:  You need  help to maintain or improve how you to do things on your own.  You need help to maintain or improve the quality of your life.  You have foot or hand problems.  You have trouble cleaning yourself, dressing, eating, or doing physical activity. GET HELP RIGHT AWAY IF:  You have trouble breathing.  You have moderate to large ketone levels.  You are unable to eat food or drink fluids for more than 6 hours.  You feel sick to your stomach (nauseous) or throw up (vomit) for more than 6 hours.  Your blood sugar level is over 240 mg/dL.  There is a change in mental status.  You get another serious illness.  You have watery poop (diarrhea) for more than 6 hours.  You have been sick or have had a fever for 2 or more days and  are not getting better.  You have pain when you are physically active. MAKE SURE YOU:  Understand these instructions.  Will watch your condition.  Will get help right away if you are not doing well or get worse. Document Released: 02/17/2008 Document Revised: 02/28/2013 Document Reviewed: 09/08/2012 [place] [facility] Information 2014 [place], [place].  Shoulder Pain The shoulder is the joint that connects your arms to your body. The bones that form the shoulder joint include the upper arm bone (humerus), the shoulder blade (scapula), and the collarbone (clavicle). The top of the humerus is shaped like a ball and fits into a rather flat socket on the scapula (glenoid cavity). A combination of muscles and strong, fibrous tissues that connect muscles to bones (tendons) support your shoulder joint and hold the ball in the socket. Small, fluid-filled sacs (bursae) are located in different areas of the joint. They act as cushions between the bones and the overlying soft tissues and help reduce friction between the gliding tendons and the bone as you move your arm. Your shoulder joint allows a wide range of motion in your arm. This range of motion allows you to do things like  scratch your back or throw a ball. However, this range of motion also makes your shoulder more prone to pain from overuse and injury. Causes of shoulder pain can originate from both injury and overuse and usually can be grouped in the following four categories:  Redness, swelling, and pain (inflammation) of the tendon (tendinitis) or the bursae (bursitis).  Instability, such as a dislocation of the joint.  Inflammation of the joint (arthritis).  Broken bone (fracture). HOME CARE INSTRUCTIONS   Apply ice to the sore area.  Put ice in a plastic bag.  Place a towel between your skin and the bag.  Leave the ice on for 15-20 minutes, 03-04 times per day for the first 2 days.  Stop using cold packs if they do not help with the pain.  If you have a shoulder sling or immobilizer, wear it as long as your caregiver instructs. Only remove it to shower or bathe. Move your arm as little as possible, but keep your hand moving to prevent swelling.  Squeeze a soft ball or foam pad as much as possible to help prevent swelling.  Only take over-the-counter or prescription medicines for pain, discomfort, or fever as directed by your caregiver. SEEK MEDICAL CARE IF:   Your shoulder pain increases, or new pain develops in your arm, hand, or fingers.  Your hand or fingers become cold and numb.  Your pain is not relieved with medicines. SEEK IMMEDIATE MEDICAL CARE IF:   Your arm, hand, or fingers are numb or tingling.  Your arm, hand, or fingers are significantly swollen or turn white or blue. MAKE SURE YOU:   Understand these instructions.  Will watch your condition.  Will get help right away if you are not doing well or get worse. Document Released: 02/17/2005 Document Revised: 02/02/2012 Document Reviewed: 04/24/2011 [facility] Patient Information 2014 [place]. Insomnia Insomnia is frequent trouble falling and/or staying asleep. Insomnia can be a long term problem or a short term  problem. Both are common. Insomnia can be a short term problem when the wakefulness is related to a certain stress or worry. Long term insomnia is often related to ongoing stress during waking hours and/or poor sleeping habits. Overtime, sleep deprivation itself can make the problem worse. Every little thing feels more severe because you are overtired and your ability  to cope is decreased. CAUSES   Stress, anxiety, and depression.  Poor sleeping habits.  Distractions such as TV in the bedroom.  Naps close to bedtime.  Engaging in emotionally charged conversations before bed.  Technical reading before sleep.  Alcohol and other sedatives. They may make the problem worse. They can hurt normal sleep patterns and normal dream activity.  Stimulants such as caffeine for several hours prior to bedtime.  Pain syndromes and shortness of breath can cause insomnia.  Exercise late at night.  Changing time zones may cause sleeping problems (jet lag). It is sometimes helpful to have someone observe your sleeping patterns. They should look for periods of not breathing during the night (sleep apnea). They should also look to see how long those periods last. If you live alone or observers are uncertain, you can also be observed at a sleep clinic where your sleep patterns will be professionally monitored. Sleep apnea requires a checkup and treatment. Give your caregivers your medical history. Give your caregivers observations your family has made about your sleep.  SYMPTOMS   Not feeling rested in the morning.  Anxiety and restlessness at bedtime.  Difficulty falling and staying asleep. TREATMENT   Your caregiver may prescribe treatment for an underlying medical disorders. Your caregiver can give advice or help if you are using alcohol or other drugs for self-medication. Treatment of underlying problems will usually eliminate insomnia problems.  Medications can be prescribed for short time use. They  are generally not recommended for lengthy use.  Over-the-counter sleep medicines are not recommended for lengthy use. They can be habit forming.  You can promote easier sleeping by making lifestyle changes such as:  Using relaxation techniques that help with breathing and reduce muscle tension.  Exercising earlier in the day.  Changing your diet and the time of your last meal. No night time snacks.  Establish a regular time to go to bed.  Counseling can help with stressful problems and worry.  Soothing music and white noise may be helpful if there are background noises you cannot remove.  Stop tedious detailed work at least one hour before bedtime. HOME CARE INSTRUCTIONS   Keep a diary. Inform your caregiver about your progress. This includes any medication side effects. See your caregiver regularly. Take note of:  Times when you are asleep.  Times when you are awake during the night.  The quality of your sleep.  How you feel the next day. This information will help your caregiver care for you.  Get out of bed if you are still awake after 15 minutes. Read or do some quiet activity. Keep the lights down. Wait until you feel sleepy and go back to bed.  Keep regular sleeping and waking hours. Avoid naps.  Exercise regularly.  Avoid distractions at bedtime. Distractions include watching television or engaging in any intense or detailed activity like attempting to balance the household checkbook.  Develop a bedtime ritual. Keep a familiar routine of bathing, brushing your teeth, climbing into bed at the same time each night, listening to soothing music. Routines increase the success of falling to sleep faster.  Use relaxation techniques. This can be using breathing and muscle tension release routines. It can also include visualizing peaceful scenes. You can also help control troubling or intruding thoughts by keeping your mind occupied with boring or repetitive thoughts like the  old concept of counting sheep. You can make it more creative like imagining planting one beautiful flower after another in your  backyard garden.  During your day, work to eliminate stress. When this is not possible use some of the previous suggestions to help reduce the anxiety that accompanies stressful situations. MAKE SURE YOU:   Understand these instructions.  Will watch your condition.  Will get help right away if you are not doing well or get worse. Document Released: 05/07/2000 Document Revised: 08/02/2011 Document Reviewed: 06/07/2007 [facility] Patient Information 2014 [place]. Lisinopril tablets What is this medicine? LISINOPRIL (lyse [place] pril) is an ACE inhibitor. This medicine is used to treat high blood pressure and heart failure. It is also used to protect the heart immediately after a heart attack. This medicine may be used for other purposes; ask your health care provider or pharmacist if you have questions. COMMON BRAND NAME(S): Prinivil, Zestril What should I tell my health care provider before I take this medicine? They need to know if you have any of these conditions: -diabetes -heart or blood vessel disease -immune system disease like lupus or scleroderma -kidney disease -low blood pressure -previous swelling of the tongue, face, or lips with difficulty breathing, difficulty swallowing, hoarseness, or tightening of the throat -an unusual or allergic reaction to lisinopril, other ACE inhibitors, insect venom, foods, dyes, or preservatives -pregnant or trying to get pregnant -breast-feeding How should I use this medicine? Take this medicine by mouth with a glass of water. Follow the directions on your prescription label. You may take this medicine with or without food. Take your medicine at regular intervals. Do not stop taking this medicine except on the advice of your doctor or health care professional. Talk to your pediatrician regarding the use of this  medicine in children. Special care may be needed. While this drug may be prescribed for children as young as 51 years of age for selected conditions, precautions do apply. Overdosage: If you think you have taken too much of this medicine contact a poison control center or emergency room at once. NOTE: This medicine is only for you. Do not share this medicine with others. What if I miss a dose? If you miss a dose, take it as soon as you can. If it is almost time for your next dose, take only that dose. Do not take double or extra doses. What may interact with this medicine? -diuretics -lithium -NSAIDs, medicines for pain and inflammation, like ibuprofen or naproxen -over-the-counter herbal supplements like hawthorn -potassium salts or potassium supplements -salt substitutes This list may not describe all possible interactions. Give your health care provider a list of all the medicines, herbs, non-prescription drugs, or dietary supplements you use. Also tell them if you smoke, drink alcohol, or use illegal drugs. Some items may interact with your medicine. What should I watch for while using this medicine? Visit your doctor or health care professional for regular check ups. Check your blood pressure as directed. Ask your doctor what your blood pressure should be, and when you should contact him or her. Call your doctor or health care professional if you notice an irregular or fast heart beat. Women should inform their doctor if they wish to become pregnant or think they might be pregnant. There is a potential for serious side effects to an unborn child. Talk to your health care professional or pharmacist for more information. Check with your doctor or health care professional if you get an attack of severe diarrhea, nausea and vomiting, or if you sweat a lot. The loss of too much body fluid can make it dangerous  for you to take this medicine. You may get drowsy or dizzy. Do not drive, use machinery, or  do anything that needs mental alertness until you know how this drug affects you. Do not stand or sit up quickly, especially if you are an older patient. This reduces the risk of dizzy or fainting spells. Alcohol can make you more drowsy and dizzy. Avoid alcoholic drinks. Avoid salt substitutes unless you are told otherwise by your doctor or health care professional. Do not treat yourself for coughs, colds, or pain while you are taking this medicine without asking your doctor or health care professional for advice. Some ingredients may increase your blood pressure. What side effects may I notice from receiving this medicine? Side effects that you should report to your doctor or health care professional as soon as possible: -abdominal pain with or without nausea or vomiting -allergic reactions like skin rash or hives, swelling of the hands, feet, face, lips, throat, or tongue -dark urine -difficulty breathing -dizzy, lightheaded or fainting spell -fever or sore throat -irregular heart beat, chest pain -pain or difficulty passing urine -redness, blistering, peeling or loosening of the skin, including inside the mouth -unusually weak -yellowing of the eyes or skin Side effects that usually do not require medical attention (report to your doctor or health care professional if they continue or are bothersome): -change in taste -cough -decreased sexual function or desire -headache -sun sensitivity -tiredness This list may not describe all possible side effects. Call your doctor for medical advice about side effects. You may report side effects to FDA at 1-800-FDA-1088. Where should I keep my medicine? Keep out of the reach of children. Store at room temperature between 15 and 30 degrees C (59 and 86 degrees F). Protect from moisture. Keep container tightly closed. Throw away any unused medicine after the expiration date. NOTE: This sheet is a summary. It may not cover all possible information.  If you have questions about this medicine, talk to your doctor, pharmacist, or health care provider.  2014, Elsevier/Gold Standard. (2007-11-13 17:36:32)  Albuterol inhalation aerosol What is this medicine? ALBUTEROL (al [place]) is a bronchodilator. It helps open up the airways in your lungs to make it easier to breathe. This medicine is used to treat and to prevent bronchospasm. This medicine may be used for other purposes; ask your health care provider or pharmacist if you have questions. COMMON BRAND NAME(S): Proair HFA, Proventil HFA, Proventil, Respirol , Ventolin HFA, Ventolin What should I tell my health care provider before I take this medicine? They need to know if you have any of the following conditions: -diabetes -heart disease or irregular heartbeat -high blood pressure -pheochromocytoma -seizures -thyroid disease -an unusual or allergic reaction to albuterol, levalbuterol, sulfites, other medicines, foods, dyes, or preservatives -pregnant or trying to get pregnant -breast-feeding How should I use this medicine? This medicine is for inhalation through the mouth. Follow the directions on your prescription label. Take your medicine at regular intervals. Do not use more often than directed. Make sure that you are using your inhaler correctly. Ask you doctor or health care provider if you have any questions. Talk to your pediatrician regarding the use of this medicine in children. Special care may be needed. Overdosage: If you think you have taken too much of this medicine contact a poison control center or emergency room at once. NOTE: This medicine is only for you. Do not share this medicine with others. What if I miss a dose? If  you miss a dose, use it as soon as you can. If it is almost time for your next dose, use only that dose. Do not use double or extra doses. What may interact with this medicine? -anti-infectives like chloroquine and  pentamidine -caffeine -cisapride -diuretics -medicines for colds -medicines for depression or for emotional or psychotic conditions -medicines for weight loss including some herbal products -methadone -some antibiotics like clarithromycin, erythromycin, levofloxacin, and linezolid -some heart medicines -steroid hormones like dexamethasone, cortisone, hydrocortisone -theophylline -thyroid hormones This list may not describe all possible interactions. Give your health care provider a list of all the medicines, herbs, non-prescription drugs, or dietary supplements you use. Also tell them if you smoke, drink alcohol, or use illegal drugs. Some items may interact with your medicine. What should I watch for while using this medicine? Tell your doctor or health care professional if your symptoms do not improve. Do not use extra albuterol. If your asthma or bronchitis gets worse while you are using this medicine, call your doctor right away. If your mouth gets dry try chewing sugarless gum or sucking hard candy. Drink water as directed. What side effects may I notice from receiving this medicine? Side effects that you should report to your doctor or health care professional as soon as possible: -allergic reactions like skin rash, itching or hives, swelling of the face, lips, or tongue -breathing problems -chest pain -feeling faint or lightheaded, falls -high blood pressure -irregular heartbeat -fever -muscle cramps or weakness -pain, tingling, numbness in the hands or feet -vomiting Side effects that usually do not require medical attention (report to your doctor or health care professional if they continue or are bothersome): -cough -difficulty sleeping -headache -nervousness or trembling -stomach upset -stuffy or runny nose -throat irritation -unusual taste This list may not describe all possible side effects. Call your doctor for medical advice about side effects. You may report side  effects to FDA at 1-800-FDA-1088. Where should I keep my medicine? Keep out of the reach of children. Store at room temperature between 15 and 30 degrees C (59 and 86 degrees F). The contents are under pressure and may burst when exposed to heat or flame. Do not freeze. This medicine does not work as well if it is too cold. Throw away any unused medicine after the expiration date. Inhalers need to be thrown away after the labeled number of puffs have been used or by the expiration date; whichever comes first. Ventolin HFA should be thrown away 12 months after removing from foil pouch. Check the instructions that come with your medicine. NOTE: This sheet is a summary. It may not cover all possible information. If you have questions about this medicine, talk to your doctor, pharmacist, or health care provider.  2014, Elsevier/Gold Standard. (2012-10-26 10:57:17)

## 2013-08-29 NOTE — Assessment & Plan Note (Addendum)
Likely mild. Not using Albuterol inhaler. Wheezing noted on exam today. Pt just got over a cold Given Ventolin sample Medication Samples have been provided to the patient.  Drug name: Ventolin HFA Qty: 1 LOT: [id number]  Exp.Date: Sept 2015  Prn 1-2 puffs for sob, wheezing  The patient has been instructed regarding the correct time, dose, and frequency of taking this medication, including desired effects and most common side effects.   Cresenciano Genre 9:52 AM 08/29/2013

## 2013-08-29 NOTE — Assessment & Plan Note (Signed)
Given info disc'ed sleep hygiene

## 2013-08-30 ENCOUNTER — Other Ambulatory Visit: Payer: Self-pay

## 2013-08-31 NOTE — Progress Notes (Signed)
Case discussed with Dr. Aundra Dubin at time of visit.  We reviewed the resident's history and exam and pertinent patient test results.  I agree with the assessment, diagnosis, and plan of care documented in the resident's note.

## 2013-09-04 ENCOUNTER — Ambulatory Visit: Payer: Self-pay

## 2014-02-08 ENCOUNTER — Emergency Department (HOSPITAL_COMMUNITY): Payer: No Typology Code available for payment source

## 2014-02-08 ENCOUNTER — Encounter (HOSPITAL_COMMUNITY): Payer: Self-pay | Admitting: Emergency Medicine

## 2014-02-08 ENCOUNTER — Emergency Department (HOSPITAL_COMMUNITY)
Admission: EM | Admit: 2014-02-08 | Discharge: 2014-02-08 | Disposition: A | Discharge status: Home / Self Care | Payer: No Typology Code available for payment source | Attending: Emergency Medicine | Admitting: Emergency Medicine

## 2014-02-08 DIAGNOSIS — J189 Pneumonia, unspecified organism: Secondary | ICD-10-CM

## 2014-02-08 DIAGNOSIS — I10 Essential (primary) hypertension: Secondary | ICD-10-CM | POA: Insufficient documentation

## 2014-02-08 DIAGNOSIS — J159 Unspecified bacterial pneumonia: Secondary | ICD-10-CM | POA: Insufficient documentation

## 2014-02-08 DIAGNOSIS — F17200 Nicotine dependence, unspecified, uncomplicated: Secondary | ICD-10-CM | POA: Insufficient documentation

## 2014-02-08 DIAGNOSIS — E119 Type 2 diabetes mellitus without complications: Secondary | ICD-10-CM | POA: Insufficient documentation

## 2014-02-08 DIAGNOSIS — R000 Tachycardia, unspecified: Secondary | ICD-10-CM | POA: Insufficient documentation

## 2014-02-08 DIAGNOSIS — R079 Chest pain, unspecified: Secondary | ICD-10-CM | POA: Insufficient documentation

## 2014-02-08 DIAGNOSIS — J45901 Unspecified asthma with (acute) exacerbation: Secondary | ICD-10-CM | POA: Insufficient documentation

## 2014-02-08 DIAGNOSIS — Z8719 Personal history of other diseases of the digestive system: Secondary | ICD-10-CM | POA: Insufficient documentation

## 2014-02-08 LAB — BASIC METABOLIC PANEL
Anion gap: 17 — ABNORMAL HIGH (ref 5–15)
BUN: 12 mg/dL (ref 6–23)
CO2: 22 mEq/L (ref 19–32)
Calcium: 9.8 mg/dL (ref 8.4–10.5)
Chloride: 91 mEq/L — ABNORMAL LOW (ref 96–112)
Creatinine, Ser: 0.77 mg/dL (ref 0.50–1.35)
GFR calc Af Amer: >90 mL/min (ref >90)
GFR calc non Af Amer: >90 mL/min (ref >90)
Glucose, Bld: 417 mg/dL — ABNORMAL HIGH (ref 70–99)
Potassium: 4.4 mEq/L (ref 3.7–5.3)
Sodium: 130 mEq/L — ABNORMAL LOW (ref 137–147)

## 2014-02-08 LAB — CBG MONITORING, ED
Glucose-Capillary: 437 mg/dL — ABNORMAL HIGH (ref 70–99)
Glucose-Capillary: 348 mg/dL — ABNORMAL HIGH (ref 70–99)

## 2014-02-08 LAB — I-STAT TROPONIN, ED
Troponin i, poc: 0.01 ng/mL (ref 0.00–0.08)
Troponin i, poc: 0.01 ng/mL (ref 0.00–0.08)

## 2014-02-08 LAB — I-STAT CHEM 8, ED
BUN: 10 mg/dL (ref 6–23)
Calcium, Ion: 1.18 mmol/L (ref 1.12–1.23)
Chloride: 98 mEq/L (ref 96–112)
Creatinine, Ser: 0.9 mg/dL (ref 0.50–1.35)
Glucose, Bld: 355 mg/dL — ABNORMAL HIGH (ref 70–99)
HCT: 41 % (ref 39.0–52.0)
Hemoglobin: 13.9 g/dL (ref 13.0–17.0)
Potassium: 3.5 mEq/L — ABNORMAL LOW (ref 3.7–5.3)
Sodium: 135 mEq/L — ABNORMAL LOW (ref 137–147)
TCO2: 23 mmol/L (ref 0–100)

## 2014-02-08 LAB — CBC
HCT: 38.7 % — ABNORMAL LOW (ref 39.0–52.0)
Hemoglobin: 13.6 g/dL (ref 13.0–17.0)
MCH: 32.3 pg (ref 26.0–34.0)
MCHC: 35.1 g/dL (ref 30.0–36.0)
MCV: 91.9 fL (ref 78.0–100.0)
Platelets: 204 10*3/uL (ref 150–400)
RBC: 4.21 MIL/uL — ABNORMAL LOW (ref 4.22–5.81)
RDW: 12.8 % (ref 11.5–15.5)
WBC: 10.1 10*3/uL (ref 4.0–10.5)

## 2014-02-08 MED ORDER — LEVOFLOXACIN 750 MG PO TABS: 750.0000 mg | ORAL_TABLET | Freq: Every day | ORAL | Status: DC

## 2014-02-08 MED ORDER — LEVOFLOXACIN IN D5W 750 MG/150ML IV SOLN: 750.0000 mg | Freq: Once | INTRAVENOUS | Status: DC

## 2014-02-08 MED ORDER — ACETAMINOPHEN-CODEINE #3 300-30 MG PO TABS
1.0000 | ORAL_TABLET | Freq: Once | ORAL | Status: AC
  Administered 2014-02-08: 1 via ORAL
  Filled 2014-02-08: qty 1

## 2014-02-08 MED ORDER — ALBUTEROL SULFATE (2.5 MG/3ML) 0.083% IN NEBU
5.0000 mg | INHALATION_SOLUTION | Freq: Once | RESPIRATORY_TRACT | Status: AC
  Administered 2014-02-08: 5 mg via RESPIRATORY_TRACT
  Filled 2014-02-08: qty 6

## 2014-02-08 MED ORDER — ALBUTEROL SULFATE HFA 108 (90 BASE) MCG/ACT IN AERS
2.0000 | INHALATION_SPRAY | Freq: Once | RESPIRATORY_TRACT | Status: AC
  Administered 2014-02-08: 2 via RESPIRATORY_TRACT
  Filled 2014-02-08: qty 6.7

## 2014-02-08 MED ORDER — LEVOFLOXACIN 750 MG PO TABS
750.0000 mg | ORAL_TABLET | Freq: Once | ORAL | Status: AC
  Administered 2014-02-08: 750 mg via ORAL
  Filled 2014-02-08: qty 1

## 2014-02-08 MED ORDER — SODIUM CHLORIDE 0.9 % IV BOLUS (SEPSIS)
2000.0000 mL | Freq: Once | INTRAVENOUS | Status: AC
  Administered 2014-02-08: 2000 mL via INTRAVENOUS

## 2014-02-08 MED ORDER — RANITIDINE HCL 150 MG/10ML PO SYRP
150.0000 mg | ORAL_SOLUTION | Freq: Once | ORAL | Status: AC
  Administered 2014-02-08: 150 mg via ORAL
  Filled 2014-02-08: qty 10

## 2014-02-08 MED ORDER — METFORMIN HCL 500 MG PO TABS: 500.0000 mg | ORAL_TABLET | Freq: Two times a day (BID) | ORAL | Status: DC

## 2014-02-08 NOTE — Discharge Instructions (Signed)
Pneumonia, Adult Allen Gould, you were seen today for fever and cough.  Your symptoms are consistent with pneumonia.  Continue antibiotics as directed.  Follow up with your regular doctor within 3 days for continued care.  If your symptoms worsen, come back to the ED for repeat evaluation.  Thank you. Pneumonia is an infection of the lungs. It may be caused by a germ (virus or bacteria). Some types of pneumonia can spread easily from person to person. This can happen when you cough or sneeze. HOME CARE  Only take medicine as told by your doctor.  Take your medicine (antibiotics) as told. Finish it even if you start to feel better.  Do not smoke.  You may use a vaporizer or humidifier in your room. This can help loosen thick spit (mucus).  Sleep so you are almost sitting up (semi-upright). This helps reduce coughing.  Rest. A shot (vaccine) can help prevent pneumonia. Shots are often advised for:  People over 50 years old.  Patients on chemotherapy.  People with long-term (chronic) lung problems.  People with immune system problems. GET HELP RIGHT AWAY IF:   You are getting worse.  You cannot control your cough, and you are losing sleep.  You cough up blood.  Your pain gets worse, even with medicine.  You have a fever.  Any of your problems are getting worse, not better.  You have shortness of breath or chest pain. MAKE SURE YOU:   Understand these instructions.  Will watch your condition.  Will get help right away if you are not doing well or get worse. Document Released: 10/27/2007 Document Revised: 08/02/2011 Document Reviewed: 07/31/2010 [facility] Patient Information 2015 [place], [place]. This information is not intended to replace advice given to you by your health care provider. Make sure you discuss any questions you have with your health care provider.

## 2014-02-08 NOTE — ED Notes (Signed)
Pt. Reports CP and SOB starting yesterday radiating down into abdomen. Reports N/V once today. Reports pain is worse with palpation.Airway intact, patient does not appear to be in distress. Pt. Also reports high CBG, has not been taking insulin 3-4 months because he can not afford it.

## 2014-02-08 NOTE — ED Provider Notes (Signed)
CSN: [id number]     Arrival date & time 02/08/14  0211 History   First MD Initiated Contact with Patient 02/08/14 [id number]     Chief Complaint  Patient presents with  . Chest Pain     (Consider location/radiation/quality/duration/timing/severity/associated sxs/prior Treatment) HPI Tharon Bomar is a 48 y.o. male with medical history of hypertension and diabetes coming in with chest pain. He states his occurred for the past 2 days. This is associated with a cough. The chest pain is midsternal without any radiation. There is no exertional component. It is made worse by his nonproductive cough. He has children who are currently sick with a viral URI, and another child who currently has meningitis in the hospital. He denies fevers or diaphoresis. Of note he has not taken his diabetes medicine in 4 months because he cannot afford them. Patient has no further complaints.  10 Systems reviewed and are negative for acute change except as noted in the HPI.     Past Medical History  Diagnosis Date  . Diabetes mellitus without complication   . Hypertension   . Asthma   . GERD (gastroesophageal reflux disease)    Past Surgical History  Procedure Laterality Date  . Appendectomy     Family History  Problem Relation Age of Onset  . Kidney disease Mother   . Diabetes Sister   . Hyperlipidemia Sister   . Heart disease Other    History  Substance Use Topics  . Smoking status: Current Some Day Smoker    Types: Cigarettes  . Smokeless tobacco: Never Used     Comment: 1 cigerette a day  . Alcohol Use: No    Review of Systems    Allergies  Shellfish allergy  Home Medications   Prior to Admission medications   Not on File   BP 133/78  Pulse 102  Temp(Src) 98.2 F (36.8 C) (Oral)  Resp 20  Ht 5\' 10"  (1.778 m)  Wt 312 lb (141.522 kg)  BMI 44.77 kg/m2  SpO2 97% Physical Exam  Nursing note and vitals reviewed. Constitutional: He is oriented to person, place, and time. Vital signs  are normal. He appears well-developed and well-nourished.  Non-toxic appearance. He does not appear ill. No distress.  HENT:  Head: Normocephalic and atraumatic.  Nose: Nose normal.  Mouth/Throat: Oropharynx is clear and moist. No oropharyngeal exudate.  Eyes: Conjunctivae and EOM are normal. Pupils are equal, round, and reactive to light. No scleral icterus.  Neck: Normal range of motion. Neck supple. No tracheal deviation, no edema, no erythema and normal range of motion present. No mass and no thyromegaly present.  Cardiovascular: Regular rhythm, S1 normal, S2 normal, normal heart sounds, intact distal pulses and normal pulses.  Exam reveals no gallop and no friction rub.   No murmur heard. Pulses:      Radial pulses are 2+ on the right side, and 2+ on the left side.       Dorsalis pedis pulses are 2+ on the right side, and 2+ on the left side.  Tachycardic  Pulmonary/Chest: Effort normal. No respiratory distress. He has wheezes. He has no rhonchi. He has no rales.  Intermittent right sided wheezing that clears with a cough. No tachypnea, no increased worker breathing.  Abdominal: Soft. Normal appearance and bowel sounds are normal. He exhibits no distension, no ascites and no mass. There is no hepatosplenomegaly. There is no tenderness. There is no rebound, no guarding and no CVA tenderness.  Musculoskeletal: Normal range of  motion. He exhibits no edema and no tenderness.  Lymphadenopathy:    He has no cervical adenopathy.  Neurological: He is alert and oriented to person, place, and time. He has normal strength. No cranial nerve deficit or sensory deficit. GCS eye subscore is 4. GCS verbal subscore is 5. GCS motor subscore is 6.  Skin: Skin is warm, dry and intact. No petechiae and no rash noted. He is not diaphoretic. No erythema. No pallor.  Psychiatric: He has a normal mood and affect. His behavior is normal. Judgment normal.    ED Course  Procedures (including critical care  time) Labs Review Labs Reviewed  CBC - Abnormal; Notable for the following:    RBC 4.21 (*)    HCT 38.7 (*)    All other components within normal limits  BASIC METABOLIC PANEL - Abnormal; Notable for the following:    Sodium 130 (*)    Chloride 91 (*)    Glucose, Bld 417 (*)    Anion gap 17 (*)    All other components within normal limits  CBG MONITORING, ED - Abnormal; Notable for the following:    Glucose-Capillary 437 (*)    All other components within normal limits  I-STAT CHEM 8, ED - Abnormal; Notable for the following:    Sodium 135 (*)    Potassium 3.5 (*)    Glucose, Bld 355 (*)    All other components within normal limits  CBG MONITORING, ED - Abnormal; Notable for the following:    Glucose-Capillary 348 (*)    All other components within normal limits  PRO B NATRIURETIC PEPTIDE  I-STAT TROPOININ, ED  I-STAT TROPOININ, ED    Imaging Review Dg Chest 2 View  02/08/2014   CLINICAL DATA:  Chest pain and shortness of breath for 1 day.  EXAM: CHEST  2 VIEW  COMPARISON:  Chest radiograph performed 08/10/2013  FINDINGS: The lungs are well-aerated and clear. There is no evidence of focal opacification, pleural effusion or pneumothorax.  The heart is normal in size; the mediastinal contour is within normal limits. No acute osseous abnormalities are seen.  IMPRESSION: No acute cardiopulmonary process seen.   Electronically Signed   By: Garald Balding M.D.   On: 02/08/2014 03:37     EKG Interpretation   Date/Time:  Friday February 08 2014 05:40:08 EDT Ventricular Rate:  102 PR Interval:  175 QRS Duration: 151 QT Interval:  386 QTC Calculation: 503 R Axis:   -89 Text Interpretation:  Sinus tachycardia RBBB and LAFB Confirmed by Glynn Octave [phone]) on 02/08/2014 5:56:45 AM      MDM   Final diagnoses:  None    Patient presents to the emergency department out of concern for chest pain. This is in the setting of cough and sick contacts of a viral URI. In the  emergency department he was given 2 L of fluid for his tachycardia and hyperglycemia. He was given a nebulized treatment. Chest x-ray as above was read as negative for pneumonia. I believe I do see early signs on the right side was correspond for physical exam. She was given Levaquin in the emergency department. Initial metabolic panel reveals hyperglycemia with a small anion gap. After fluid resuscitation the gap was closed. Blood sugar improved to the 300s. Patient likely has borderline tachycardia because of the albuterol treatments given to him. My last evaluation reveals a heart rate exactly at 100. Patient was given return precautions and is safe to be discharged home.  Everlene Balls, MD 02/08/14 [phone]

## 2014-02-28 ENCOUNTER — Ambulatory Visit (INDEPENDENT_AMBULATORY_CARE_PROVIDER_SITE_OTHER): Payer: Self-pay | Admitting: Internal Medicine

## 2014-02-28 ENCOUNTER — Encounter: Payer: Self-pay | Admitting: Internal Medicine

## 2014-02-28 ENCOUNTER — Ambulatory Visit: Payer: No Typology Code available for payment source

## 2014-02-28 VITALS — BP 143/105 | HR 96 | Temp 98.3°F | Ht 70.0 in | Wt 310.7 lb

## 2014-02-28 DIAGNOSIS — M25511 Pain in right shoulder: Secondary | ICD-10-CM

## 2014-02-28 DIAGNOSIS — Z0000 Encounter for general adult medical examination without abnormal findings: Secondary | ICD-10-CM

## 2014-02-28 DIAGNOSIS — I10 Essential (primary) hypertension: Secondary | ICD-10-CM

## 2014-02-28 DIAGNOSIS — E1165 Type 2 diabetes mellitus with hyperglycemia: Secondary | ICD-10-CM

## 2014-02-28 DIAGNOSIS — E785 Hyperlipidemia, unspecified: Secondary | ICD-10-CM

## 2014-02-28 LAB — CBC WITH DIFFERENTIAL/PLATELET
Basophils Absolute: 0 10*3/uL (ref 0.0–0.1)
Basophils Relative: 0 % (ref 0–1)
Eosinophils Absolute: 0.3 10*3/uL (ref 0.0–0.7)
Eosinophils Relative: 3 % (ref 0–5)
HCT: 41.9 % (ref 39.0–52.0)
Hemoglobin: 14.8 g/dL (ref 13.0–17.0)
Lymphocytes Relative: 29 % (ref 12–46)
Lymphs Abs: 2.6 10*3/uL (ref 0.7–4.0)
MCH: 32.4 pg (ref 26.0–34.0)
MCHC: 35.3 g/dL (ref 30.0–36.0)
MCV: 91.7 fL (ref 78.0–100.0)
Monocytes Absolute: 0.7 10*3/uL (ref 0.1–1.0)
Monocytes Relative: 8 % (ref 3–12)
Neutro Abs: 5.3 10*3/uL (ref 1.7–7.7)
Neutrophils Relative %: 60 % (ref 43–77)
Platelets: 223 10*3/uL (ref 150–400)
RBC: 4.57 MIL/uL (ref 4.22–5.81)
RDW: 13.9 % (ref 11.5–15.5)
WBC: 8.9 10*3/uL (ref 4.0–10.5)

## 2014-02-28 LAB — COMPLETE METABOLIC PANEL WITH GFR
ALT: 10 U/L (ref 0–53)
AST: 9 U/L (ref 0–37)
Albumin: 4.5 g/dL (ref 3.5–5.2)
Alkaline Phosphatase: 68 U/L (ref 39–117)
BUN: 22 mg/dL (ref 6–23)
CO2: 25 mEq/L (ref 19–32)
Calcium: 9.9 mg/dL (ref 8.4–10.5)
Chloride: 96 mEq/L (ref 96–112)
Creat: 1.01 mg/dL (ref 0.50–1.35)
GFR, Est African American: >89 mL/min
GFR, Est Non African American: 86 mL/min
Glucose, Bld: 302 mg/dL — ABNORMAL HIGH (ref 70–99)
Potassium: 4.7 mEq/L (ref 3.5–5.3)
Sodium: 134 mEq/L — ABNORMAL LOW (ref 135–145)
Total Bilirubin: 0.4 mg/dL (ref 0.2–1.2)
Total Protein: 7.7 g/dL (ref 6.0–8.3)

## 2014-02-28 LAB — GLUCOSE, CAPILLARY: Glucose-Capillary: 328 mg/dL — ABNORMAL HIGH (ref 70–99)

## 2014-02-28 LAB — HM DIABETES EYE EXAM

## 2014-02-28 LAB — LIPID PANEL
Cholesterol: 246 mg/dL — ABNORMAL HIGH (ref 0–200)
HDL: 41 mg/dL (ref >39)
Total CHOL/HDL Ratio: 6 Ratio
Triglycerides: 588 mg/dL — ABNORMAL HIGH (ref <150)

## 2014-02-28 LAB — POCT GLYCOSYLATED HEMOGLOBIN (HGB A1C): Hemoglobin A1C: 13.9

## 2014-02-28 MED ORDER — TRAMADOL HCL 50 MG PO TABS: 50.0000 mg | ORAL_TABLET | Freq: Four times a day (QID) | ORAL | Status: DC | PRN

## 2014-02-28 MED ORDER — METFORMIN HCL 1000 MG PO TABS: 1000.0000 mg | ORAL_TABLET | Freq: Two times a day (BID) | ORAL | Status: DC

## 2014-02-28 MED ORDER — GLUCOSE BLOOD VI STRP: ORAL_STRIP | Status: DC

## 2014-02-28 MED ORDER — "INSULIN SYRINGE 31G X 5/16"" 1 ML MISC": 1.0000 | Freq: Three times a day (TID) | Status: DC

## 2014-02-28 MED ORDER — LISINOPRIL 10 MG PO TABS
10.0000 mg | ORAL_TABLET | Freq: Every day | ORAL | Status: DC
Start: 2014-02-28 — End: 2014-08-08

## 2014-02-28 MED ORDER — INSULIN NPH ISOPHANE & REGULAR (70-30) 100 UNIT/ML ~~LOC~~ SUSP: 35.0000 [IU] | Freq: Two times a day (BID) | SUBCUTANEOUS | Status: DC

## 2014-02-28 MED ORDER — INSULIN NPH ISOPHANE & REGULAR (70-30) 100 UNIT/ML ~~LOC~~ SUSP: 30.0000 [IU] | Freq: Two times a day (BID) | SUBCUTANEOUS | Status: DC

## 2014-02-28 MED ORDER — BLOOD GLUCOSE METER KIT: PACK | Status: DC

## 2014-02-28 MED ORDER — LANCETS 30G MISC: Status: DC

## 2014-02-28 NOTE — Patient Instructions (Signed)
General Instructions: Please follow up in 3 months  Get meter and medications from [facility] or [facility]  Take care  Get X ray when you can  Treatment Goals:  Goals (1 Years of Data) as of 02/28/14         As of Today 02/08/14 02/08/14 02/08/14 02/08/14     Blood Pressure    . Blood Pressure < 140/90  160/106 152/92 133/78 142/87 142/86     Result Component    . HEMOGLOBIN A1C < 7.0  13.9          Progress Toward Treatment Goals:  Treatment Goal 02/28/2014  Hemoglobin A1C unchanged  Blood pressure unchanged  Stop smoking -    Self Care Goals & Plans:  Self Care Goal 02/28/2014  Manage my medications take my medicines as prescribed; bring my medications to every visit; refill my medications on time; follow the sick day instructions if I am sick  Monitor my health keep track of my blood pressure; bring my glucose meter and log to each visit; keep track of my blood glucose; bring my blood pressure log to each visit; keep track of my weight; check my feet daily  Eat healthy foods drink diet soda or water instead of juice or soda; eat more vegetables; eat foods that are low in salt; eat baked foods instead of fried foods; eat fruit for snacks and desserts; eat smaller portions  Be physically active find an activity I enjoy  Stop smoking -  Meeting treatment goals maintain the current self-care plan    Home Blood Glucose Monitoring 02/28/2014  Check my blood sugar 3 times a day  When to check my blood sugar before meals; at bedtime     Care Management & Community Referrals:  Referral 02/28/2014  Referrals made for care management support none needed  Referrals made to community resources none     Type 2 Diabetes Mellitus Type 2 diabetes mellitus is a long-term (chronic) disease. In type 2 diabetes:  The pancreas does not make enough of a hormone called insulin.  The cells in the body do not respond as well to the insulin that is made.  Both of the above can  happen. Normally, insulin moves sugars from food into tissue cells. This gives you energy. If you have type 2 diabetes, sugars cannot be moved into tissue cells. This causes high blood sugar (hyperglycemia).  HOME CARE  Have your hemoglobin A1c level checked twice a year. The level shows if your diabetes is under control or out of control.  Test your blood sugar level every day as told by your doctor.  Check your ketone levels by testing your pee (urine) when you are sick and as told.  Take your diabetes or insulin medicine as told by your doctor.  Never run out of insulin.  Adjust how much insulin you give yourself based on how many carbs (carbohydrates) you eat. Carbs are in many foods, such as fruits, vegetables, whole grains, and dairy products.  Have a healthy snack between every healthy meal. Have 3 meals and 3 snacks a day.  Lose weight if you are overweight.  Carry a medical alert card or wear your medical alert jewelry.  Carry a 15-gram carb snack with you at all times. Examples include:  Glucose pills, 3 or 4.  Glucose gel, 15-gram tube.  Raisins, 2 tablespoons (24 grams).  Jelly beans, 6.  Animal crackers, 8.  Regular (not diet) pop, 4 ounces (120 milliliters).  Gummy treats, 9.  Notice low blood sugar (hypoglycemia) symptoms, such as:  Shaking (tremors).  Trouble thinking clearly.  Sweating.  Faster heart rate.  Headache.  Dry mouth.  Hunger.  Crabbiness (irritability).  Being worried or tense (anxious).  Restless sleep.  A change in speech or coordination.  Confusion.  Treat low blood sugar right away. If you are alert and can swallow, follow the 15:15 rule:  Take 15-20 grams of a rapid-acting glucose or carb. This includes glucose gel, glucose pills, or 4 ounces (120 milliliters) of fruit juice, regular pop, or low-fat milk.  Check your blood sugar level 15 minutes after taking the glucose.  Take 15-20 grams more of glucose if the  repeat blood sugar level is still 70 mg/dL (milligrams/deciliter) or below.  Eat a meal or snack within 1 hour of the blood sugar levels going back to normal.  Notice early symptoms of high blood sugar, such as:  Being really thirsty or drinking a lot (polydipsia).  Peeing a lot (polyuria).  Do at least 150 minutes of physical activity a week or as told.  Split the 150 minutes of activity up during the week. Do not do 150 minutes of activity in one day.  Perform exercises, such as weight lifting, at least 2 times a week or as told.  Spend no more than 90 minutes at one time inactive.  Adjust your insulin or food intake as needed if you start a new exercise or sport.  Follow your sick-day plan when you are not able to eat or drink as usual.  Do not smoke, chew tobacco, or use electronic cigarettes.  Women who are not pregnant should drink no more than 1 drink a day. Men should drink no more than 2 drinks a day.  Only drink alcohol with food.  Ask your doctor if alcohol is safe for you.  Tell your doctor if you drink alcohol several times during the week.  See your doctor regularly.  Schedule an eye exam soon after you are told you have diabetes. Schedule exams once every year.  Check your skin and feet every day. Check for cuts, bruises, redness, nail problems, bleeding, blisters, or sores. A doctor should do a foot exam once a year.  Brush your teeth and gums twice a day. Floss once a day. Visit your dentist regularly.  Share your diabetes plan with your workplace or school.  Stay up-to-date with shots that fight against diseases (immunizations).  Learn how to deal with stress.  Get diabetes education and support as needed.  Ask your doctor for special help if:  You need help to maintain or improve how you do things on your own.  You need help to maintain or improve the quality of your life.  You have foot or hand problems.  You have trouble cleaning yourself,  dressing, eating, or doing physical activity. GET HELP IF:  You are unable to eat or drink for more than 6 hours.  You feel sick to your stomach (nauseous) or throw up (vomit) for more than 6 hours.  Your blood sugar level is over 240 mg/dL.  There is a change in mental status.  You get another serious illness.  You have watery poop (diarrhea) for more than 6 hours.  You have been sick or have had a fever for 2 or more days and are not getting better.  You have pain when you are active. GET HELP RIGHT AWAY IF:  You have trouble breathing.  Your ketone levels are higher than your doctor says they should be. MAKE SURE YOU:  Understand these instructions.  Will watch your condition.  Will get help right away if you are not doing well or get worse. Document Released: 02/17/2008 Document Revised: 09/24/2013 Document Reviewed: 12/10/2011 [facility] Patient Information 2015 [place], [place]. This information is not intended to replace advice given to you by your health care provider. Make sure you discuss any questions you have with your health care provider.  Exercise to Lose Weight Exercise and a healthy diet may help you lose weight. Your doctor may suggest specific exercises. EXERCISE IDEAS AND TIPS  Choose low-cost things you enjoy doing, such as walking, bicycling, or exercising to workout videos.  Take stairs instead of the elevator.  Walk during your lunch break.  Park your car further away from work or school.  Go to a gym or an exercise class.  Start with 5 to 10 minutes of exercise each day. Build up to 30 minutes of exercise 4 to 6 days a week.  Wear shoes with good support and comfortable clothes.  Stretch before and after working out.  Work out until you breathe harder and your heart beats faster.  Drink extra water when you exercise.  Do not do so much that you hurt yourself, feel dizzy, or get very short of breath. Exercises that burn about 150  calories:  Running 1  miles in 15 minutes.  Playing volleyball for 45 to 60 minutes.  Washing and waxing a car for 45 to 60 minutes.  Playing touch football for 45 minutes.  Walking 1  miles in 35 minutes.  Pushing a stroller 1  miles in 30 minutes.  Playing basketball for 30 minutes.  Raking leaves for 30 minutes.  Bicycling 5 miles in 30 minutes.  Walking 2 miles in 30 minutes.  Dancing for 30 minutes.  Shoveling snow for 15 minutes.  Swimming laps for 20 minutes.  Walking up stairs for 15 minutes.  Bicycling 4 miles in 15 minutes.  Gardening for 30 to 45 minutes.  Jumping rope for 15 minutes.  Washing windows or floors for 45 to 60 minutes. Document Released: 06/12/2010 Document Revised: 08/02/2011 Document Reviewed: 06/12/2010 [facility] Patient Information 2015 [place], [place]. This information is not intended to replace advice given to you by your health care provider. Make sure you discuss any questions you have with your health care provider.  Hypertension Hypertension, commonly called high blood pressure, is when the force of blood pumping through your arteries is too strong. Your arteries are the blood vessels that carry blood from your heart throughout your body. A blood pressure reading consists of a higher number over a lower number, such as 110/72. The higher number (systolic) is the pressure inside your arteries when your heart pumps. The lower number (diastolic) is the pressure inside your arteries when your heart relaxes. Ideally you want your blood pressure below 120/80. Hypertension forces your heart to work harder to pump blood. Your arteries may become narrow or stiff. Having hypertension puts you at risk for heart disease, stroke, and other problems.  RISK FACTORS Some risk factors for high blood pressure are controllable. Others are not.  Risk factors you cannot control include:   Race. You may be at higher risk if you are African  American.  Age. Risk increases with age.  Gender. Men are at higher risk than women before age 68 years. After age 56, women are at higher risk than men.  Risk factors you can control include:  Not getting enough exercise or physical activity.  Being overweight.  Getting too much fat, sugar, calories, or salt in your diet.  Drinking too much alcohol. SIGNS AND SYMPTOMS Hypertension does not usually cause signs or symptoms. Extremely high blood pressure (hypertensive crisis) may cause headache, anxiety, shortness of breath, and nosebleed. DIAGNOSIS  To check if you have hypertension, your health care provider will measure your blood pressure while you are seated, with your arm held at the level of your heart. It should be measured at least twice using the same arm. Certain conditions can cause a difference in blood pressure between your right and left arms. A blood pressure reading that is higher than normal on one occasion does not mean that you need treatment. If one blood pressure reading is high, ask your health care provider about having it checked again. TREATMENT  Treating high blood pressure includes making lifestyle changes and possibly taking medicine. Living a healthy lifestyle can help lower high blood pressure. You may need to change some of your habits. Lifestyle changes may include:  Following the DASH diet. This diet is high in fruits, vegetables, and whole grains. It is low in salt, red meat, and added sugars.  Getting at least 2 hours of brisk physical activity every week.  Losing weight if necessary.  Not smoking.  Limiting alcoholic beverages.  Learning ways to reduce stress. If lifestyle changes are not enough to get your blood pressure under control, your health care provider may prescribe medicine. You may need to take more than one. Work closely with your health care provider to understand the risks and benefits. HOME CARE INSTRUCTIONS  Have your blood  pressure rechecked as directed by your health care provider.   Take medicines only as directed by your health care provider. Follow the directions carefully. Blood pressure medicines must be taken as prescribed. The medicine does not work as well when you skip doses. Skipping doses also puts you at risk for problems.   Do not smoke.   Monitor your blood pressure at home as directed by your health care provider. SEEK MEDICAL CARE IF:   You think you are having a reaction to medicines taken.  You have recurrent headaches or feel dizzy.  You have swelling in your ankles.  You have trouble with your vision. SEEK IMMEDIATE MEDICAL CARE IF:  You develop a severe headache or confusion.  You have unusual weakness, numbness, or feel faint.  You have severe chest or abdominal pain.  You vomit repeatedly.  You have trouble breathing. MAKE SURE YOU:   Understand these instructions.  Will watch your condition.  Will get help right away if you are not doing well or get worse. Document Released: 05/10/2005 Document Revised: 09/24/2013 Document Reviewed: 03/02/2013 [facility] Patient Information 2015 [place], [place]. This information is not intended to replace advice given to you by your health care provider. Make sure you discuss any questions you have with your health care provider.

## 2014-03-01 ENCOUNTER — Encounter: Payer: Self-pay | Admitting: Internal Medicine

## 2014-03-01 DIAGNOSIS — Z0000 Encounter for general adult medical examination without abnormal findings: Secondary | ICD-10-CM

## 2014-03-01 DIAGNOSIS — E785 Hyperlipidemia, unspecified: Secondary | ICD-10-CM | POA: Insufficient documentation

## 2014-03-01 HISTORY — DX: Encounter for general adult medical examination without abnormal findings: Z00.00

## 2014-03-01 HISTORY — DX: Hyperlipidemia, unspecified: E78.5

## 2014-03-01 LAB — MICROALBUMIN / CREATININE URINE RATIO
Creatinine, Urine: 141.6 mg/dL
Microalb Creat Ratio: 38.1 mg/g — ABNORMAL HIGH (ref 0.0–30.0)
Microalb, Ur: 5.4 mg/dL — ABNORMAL HIGH (ref <2.0)

## 2014-03-01 LAB — LDL CHOLESTEROL, DIRECT: Direct LDL: 108 mg/dL — ABNORMAL HIGH

## 2014-03-01 NOTE — Assessment & Plan Note (Signed)
Will give flu at f/u visit  Disc colonoscopy again at f/u visit

## 2014-03-01 NOTE — Assessment & Plan Note (Addendum)
Lab Results  Component Value Date   HGBA1C 13.9 02/28/2014   HGBA1C 14.9* 08/10/2013     Assessment: Diabetes control: poor control (HgbA1C >9%) Progress toward A1C goal:  unchanged Comments: noncompliant with medications. Likely with some peripheral neuropathy  Plan: Medications:  Will resume 70/30 35 units bid, Metformin 1000 mg bid and can titrate up if needed in the future  Home glucose monitoring: Frequency: 3 times a day Timing: before meals;at bedtime Instruction/counseling given: reminded to bring blood glucose meter & log to each visit and provided printed educational material Educational resources provided: brochure Self management tools provided: copy of home glucose meter download;home glucose logbook Other plans: Rx glucose meter and supplies, eye exam today, HA1C, urine microAlb/Creatinine

## 2014-03-01 NOTE — Progress Notes (Signed)
   Subjective:    Patient ID: Allen Gould, male    DOB: [birth year], 48 y.o.   MRN: [id number]  HPI Comments: 48 y.o with uncontrolled HTN, uncontrolled DM, obesity, asthma  He presents for f/u 1. Recently was in the ED and tx'ed for CAP with Levoquin x 4 days though CXR was negative for infiltrate. He is better overall since the ED visit 2. DM 2-uncontrolled HA1C previously 14.9 then 13.9 today with cbg of 328.  He has not followed up since 08/2013 when he was on NPH 10 units am and 15 units in PM, Metformin 500 mg bid.  He stopped taking his medications as he said the numbers were better.  He does not have a glucose meter and he was previously using someone else's meter. He does report some burning and tingling in his b/l feet.  3. Uncontrolled HTN-His BP was 160/106 then 143/105.  He was previously taking Lisinopril 5 mg qd but stopped as he said his BP has been better.  4. Right shoulder pain- noted since 2011 where he was lifting a child and heard his shoulder pop. Since then he has had intermittent pain which is worsening and prevents him from sleeping.  He states the pain is 12/10.  Sleeping on the shoulder makes the pain worse, nothing makes it better he has tried Tylenol and Advil w/o relief.     SH: Works seasonally for [employer] now working for [employer].  He proposed to his girlfriend [name] and they were supposed to get married 02/11/14 but currently he has moved out of the home with her as her son and his girlfriend and 4 kids moved into their home and has caused some issues though he hopes to get back together with her.    He now has the orange card    [id number] to wait on colonoscopy for now.  Will get retinal eye exam today, lipid, repeat HA1C, CMET, CBC, urine microalbumin, received flu 07/2013 will disc repeating at f/u visit      Review of Systems  Respiratory: Negative for cough and shortness of breath.   Cardiovascular: Negative for chest pain.         Objective:   Physical Exam  Nursing note and vitals reviewed. Constitutional: He is oriented to person, place, and time. He appears well-developed and well-nourished. He is cooperative.  HENT:  Head: Normocephalic and atraumatic.  Mouth/Throat: No oropharyngeal exudate.  Eyes: Conjunctivae are normal. Right eye exhibits no discharge. Left eye exhibits no discharge. No scleral icterus.  Cardiovascular: Normal rate, regular rhythm, S1 normal, S2 normal and normal heart sounds.   No murmur heard. No lower extremity edema b/l  Pulmonary/Chest: Effort normal and breath sounds normal.  Abdominal: Soft. Bowel sounds are normal. He exhibits no distension. There is no tenderness.  Musculoskeletal: He exhibits no edema.  Neurological: He is alert and oriented to person, place, and time. Gait normal.  Skin: Skin is warm, dry and intact. No rash noted.  Psychiatric: He has a normal mood and affect. His speech is normal and behavior is normal. Judgment and thought content normal. Cognition and memory are normal.          Assessment & Plan:  F/u in 3 months

## 2014-03-01 NOTE — Assessment & Plan Note (Signed)
Could be OA Will image with Xray right shoulder Will try Tramadol 50 mg prn  Consider referral to sports medicine in the future

## 2014-03-01 NOTE — Addendum Note (Signed)
Addended by: Cresenciano Genre on: 03/01/2014 10:03 AM   Modules accepted: Orders

## 2014-03-01 NOTE — Assessment & Plan Note (Signed)
BP Readings from Last 3 Encounters:  02/28/14 143/105  02/08/14 152/92  08/29/13 152/103    Lab Results  Component Value Date   NA 134* 02/28/2014   K 4.7 02/28/2014   CREATININE 1.01 02/28/2014    Assessment: Blood pressure control: moderately elevated Progress toward BP goal:  unchanged Comments: stopped BP meds prior to this visit  Plan: Medications:  continue current medications resume Lisinopril increase to 10 mg may need further titration in the future Educational resources provided: brochure Self management tools provided: info Other plans: will check CMET, lipid panel today

## 2014-03-01 NOTE — Assessment & Plan Note (Signed)
Pending direct LDL

## 2014-03-04 ENCOUNTER — Other Ambulatory Visit: Payer: Self-pay | Admitting: Internal Medicine

## 2014-03-04 DIAGNOSIS — E785 Hyperlipidemia, unspecified: Secondary | ICD-10-CM

## 2014-03-04 MED ORDER — ATORVASTATIN CALCIUM 40 MG PO TABS: 40.0000 mg | ORAL_TABLET | Freq: Every day | ORAL | Status: DC

## 2014-03-05 ENCOUNTER — Encounter: Payer: Self-pay | Admitting: *Deleted

## 2014-03-05 NOTE — Progress Notes (Signed)
Rx called in to pharmacy. Hilda Blades Laneisha Mino RN 03/05/14 9:50AM

## 2014-03-05 NOTE — Progress Notes (Signed)
INTERNAL MEDICINE TEACHING ATTENDING ADDENDUM - Rifky Lapre, MD: I reviewed and discussed at the time of visit with the resident Dr. McLean, the patient's medical history, physical examination, diagnosis and results of pertinent tests and treatment and I agree with the patient's care as documented.  

## 2014-03-28 ENCOUNTER — Encounter: Payer: Self-pay | Admitting: Internal Medicine

## 2014-04-22 ENCOUNTER — Telehealth: Payer: Self-pay | Admitting: *Deleted

## 2014-04-22 NOTE — Telephone Encounter (Signed)
Talked with pt - only wants to talk with Dr Aundra Dubin - message left for Dr Aundra Dubin to call pt [phone]. Hilda Blades Dim Meisinger RN 04/22/14 5PM

## 2014-04-23 ENCOUNTER — Telehealth: Payer: Self-pay | Admitting: Internal Medicine

## 2014-04-23 DIAGNOSIS — M25511 Pain in right shoulder: Secondary | ICD-10-CM

## 2014-04-23 MED ORDER — TRAMADOL HCL 50 MG PO TABS: 100.0000 mg | ORAL_TABLET | Freq: Two times a day (BID) | ORAL | Status: DC | PRN

## 2014-04-23 NOTE — Telephone Encounter (Signed)
Pt c/o right shoulder pain.  He will get Xray of right shoulder 04/24/14. He has appt for f/u coming up.  Will call in Tramadol 100 mg bid prn.  He failed Advil and Tylenol gives him nosebleeds. If Xray is revealing or unrevealing will prob refer to [place] MD

## 2014-04-23 NOTE — Telephone Encounter (Signed)
Rx called in to pharmacy. Pt aware.  Hilda Blades Bessy Reaney RN 04/23/14 9:10AM

## 2014-04-24 ENCOUNTER — Ambulatory Visit (HOSPITAL_COMMUNITY)
Admission: RE | Admit: 2014-04-24 | Discharge: 2014-04-24 | Disposition: A | Discharge status: Home / Self Care | Payer: Self-pay | Source: Ambulatory Visit | Attending: Internal Medicine | Admitting: Internal Medicine

## 2014-04-24 ENCOUNTER — Other Ambulatory Visit: Payer: Self-pay | Admitting: Internal Medicine

## 2014-04-24 DIAGNOSIS — X58XXXA Exposure to other specified factors, initial encounter: Secondary | ICD-10-CM | POA: Insufficient documentation

## 2014-04-24 DIAGNOSIS — M25511 Pain in right shoulder: Secondary | ICD-10-CM | POA: Insufficient documentation

## 2014-04-29 ENCOUNTER — Telehealth: Payer: Self-pay | Admitting: *Deleted

## 2014-04-29 NOTE — Telephone Encounter (Signed)
RTC to pt with message that Dr. Aundra Dubin has been messaged to give him a call about his results.  Message was left on patients voicemail that the Clinics had returned his call.  Sander Nephew, RN 04/29/2014 8:57 AM Call to patient given results of Xray and plan to refer to Sports Medicine.  Pt dose not have insurance. Plans to move to another county-did not say when this would occur.  Pt was advised to try to get insurance so that if possible go to Sports Medicine prior to relocating .  Pt voiced understanding of plan.  Sander Nephew, RN 04/29/2014 9:07 AM

## 2014-05-30 ENCOUNTER — Encounter: Payer: Self-pay | Admitting: Internal Medicine

## 2014-07-10 ENCOUNTER — Telehealth: Payer: Self-pay | Admitting: Internal Medicine

## 2014-07-10 NOTE — Telephone Encounter (Signed)
Call to patient to confirm appointment for 07/11/14 at 2:15 lmtcb

## 2014-07-11 ENCOUNTER — Encounter: Payer: Self-pay | Admitting: Internal Medicine

## 2014-08-08 ENCOUNTER — Ambulatory Visit (INDEPENDENT_AMBULATORY_CARE_PROVIDER_SITE_OTHER): Payer: Self-pay | Admitting: Internal Medicine

## 2014-08-08 ENCOUNTER — Encounter: Payer: Self-pay | Admitting: Internal Medicine

## 2014-08-08 VITALS — BP 140/100 | HR 80 | Temp 98.0°F | Ht 70.0 in | Wt 319.2 lb

## 2014-08-08 DIAGNOSIS — Z0000 Encounter for general adult medical examination without abnormal findings: Secondary | ICD-10-CM

## 2014-08-08 DIAGNOSIS — R059 Cough, unspecified: Secondary | ICD-10-CM

## 2014-08-08 DIAGNOSIS — E1310 Other specified diabetes mellitus with ketoacidosis without coma: Secondary | ICD-10-CM

## 2014-08-08 DIAGNOSIS — M25511 Pain in right shoulder: Secondary | ICD-10-CM

## 2014-08-08 DIAGNOSIS — I10 Essential (primary) hypertension: Secondary | ICD-10-CM

## 2014-08-08 DIAGNOSIS — Z599 Problem related to housing and economic circumstances, unspecified: Secondary | ICD-10-CM

## 2014-08-08 DIAGNOSIS — E1110 Type 2 diabetes mellitus with ketoacidosis without coma: Secondary | ICD-10-CM

## 2014-08-08 DIAGNOSIS — E1165 Type 2 diabetes mellitus with hyperglycemia: Secondary | ICD-10-CM

## 2014-08-08 DIAGNOSIS — Z598 Other problems related to housing and economic circumstances: Secondary | ICD-10-CM

## 2014-08-08 DIAGNOSIS — N481 Balanitis: Secondary | ICD-10-CM

## 2014-08-08 DIAGNOSIS — R05 Cough: Secondary | ICD-10-CM

## 2014-08-08 LAB — BASIC METABOLIC PANEL WITH GFR
BUN: 12 mg/dL (ref 6–23)
CO2: 24 mEq/L (ref 19–32)
Calcium: 10.2 mg/dL (ref 8.4–10.5)
Chloride: 96 mEq/L (ref 96–112)
Creat: 0.96 mg/dL (ref 0.50–1.35)
GFR, Est African American: >89 mL/min
GFR, Est Non African American: >89 mL/min
Glucose, Bld: 426 mg/dL — ABNORMAL HIGH (ref 70–99)
Potassium: 4.4 mEq/L (ref 3.5–5.3)
Sodium: 134 mEq/L — ABNORMAL LOW (ref 135–145)

## 2014-08-08 LAB — GLUCOSE, CAPILLARY: Glucose-Capillary: 442 mg/dL — ABNORMAL HIGH (ref 70–99)

## 2014-08-08 LAB — POCT GLYCOSYLATED HEMOGLOBIN (HGB A1C): Hemoglobin A1C: >14

## 2014-08-08 MED ORDER — AMOXICILLIN-POT CLAVULANATE 500-125 MG PO TABS: 1.0000 | ORAL_TABLET | Freq: Three times a day (TID) | ORAL | Status: DC

## 2014-08-08 MED ORDER — TRAMADOL HCL 50 MG PO TABS: 100.0000 mg | ORAL_TABLET | Freq: Two times a day (BID) | ORAL | Status: DC | PRN

## 2014-08-08 MED ORDER — FLUCONAZOLE 150 MG PO TABS: 150.0000 mg | ORAL_TABLET | Freq: Every day | ORAL | Status: DC

## 2014-08-08 MED ORDER — LISINOPRIL 10 MG PO TABS: 20.0000 mg | ORAL_TABLET | Freq: Every day | ORAL | Status: DC

## 2014-08-08 NOTE — Patient Instructions (Addendum)
General Instructions: Increase Lisinopril to 20 mg or ( 2 pills daily)  Follow up in 1-2 weeks, sooner if needed  Take Fluconazole 150 mg x 1  Take Augmentin 375 3x per day x 7 days   Treatment Goals:  Goals (1 Years of Data) as of 08/08/14          As of Today 02/28/14 02/28/14 02/08/14 02/08/14     Blood Pressure   . Blood Pressure < 140/90  185/112 143/105 160/106 152/92 133/78     Result Component   . HEMOGLOBIN A1C < 7.0  >14.0 13.9         Progress Toward Treatment Goals:  Treatment Goal 08/08/2014  Hemoglobin A1C deteriorated  Blood pressure deteriorated  Stop smoking stopped smoking    Self Care Goals & Plans:  Self Care Goal 08/08/2014  Manage my medications take my medicines as prescribed; bring my medications to every visit; refill my medications on time; follow the sick day instructions if I am sick  Monitor my health keep track of my blood pressure  Eat healthy foods -  Be physically active -  Stop smoking -  Meeting treatment goals maintain the current self-care plan    Home Blood Glucose Monitoring 08/08/2014  Check my blood sugar 2 times a day  When to check my blood sugar before meals     Care Management & Community Referrals:  Referral 08/08/2014  Referrals made for care management support none needed  Referrals made to community resources none        Phimosis Phimosis is a tightening (constricting) of the foreskin over the head of the penis. In an uncircumcised male, the foreskin may be so tight that it cannot be easily pulled back over the head of the penis. This is common in young boys (up to 51 years old) but may occur at any age. Treatment is not needed right away as long as urine can be passed. This condition should improve on its own with time. It may follow infection or injury, or occur from poor cleaning under the foreskin.  TREATMENT  Treatment for phimosis usually begins after age 21. Conservative treatments can include steroid creams  and ointments. Removing part of the foreskin (circumcision) may be required for severe cases that result in poor or no blood supply to the tip of the penis. HOME CARE INSTRUCTIONS   Do not try to force back the foreskin. This may cause scarring and make the condition worse.  Clean under the foreskin regularly. Specific Instructions for Babies In uncircumcised babies, the foreskin is normally tight. It usually does not start to loosen enough to pull back until the baby is at least 66 months old. Until then, treat as your health care provider directs. Later, you may gently pull back the foreskin during bathing to wash the penis.  SEEK MEDICAL CARE IF:   There is redness, swelling, or drainage from the foreskin. These are signs of infection.  There is pain when passing urine. SEEK IMMEDIATE MEDICAL CARE IF:  Urine has not been passed in 24 hours.  A fever develops. MAKE SURE YOU:  Understand these instructions.  Will watch the condition.  Will get help right away if the condition gets worse. Document Released: 05/07/2000 Document Revised: 05/15/2013 Document Reviewed: 10/02/2008 [facility] Patient Information 2015 [place], [place]. This information is not intended to replace advice given to you by your health care provider. Make sure you discuss any questions you have with your health care  provider.  Balanitis Balanitis is inflammation of the head of the penis (glans).  CAUSES  Balanitis has multiple causes, both infectious and noninfectious. Often balanitis is the result of poor personal hygiene, especially in uncircumcised males. Without adequate washing, viruses, bacteria, and yeast collect between the foreskin and the glans. This can cause an infection. Lack of air and irritation from a normal secretion called smegma contribute to the cause in uncircumcised males. Other causes include:  Chemical irritation from the use of certain soaps and shower gels (especially soaps with perfumes),  condoms, personal lubricants, petroleum jelly, spermicides, and fabric conditioners.  Skin conditions, such as eczema, dermatitis, and psoriasis.  Allergies to drugs, such as tetracycline and sulfa.  Certain medical conditions, including liver cirrhosis, congestive heart failure, and kidney disease.  Morbid obesity. RISK FACTORS  Diabetes mellitus.  A tight foreskin that is difficult to pull back past the glans (phimosis).  Sex without the use of a condom. SIGNS AND SYMPTOMS  Symptoms may include:  Discharge coming from under the foreskin.  Tenderness.  Itching and inability to get an erection (because of the pain).  Redness and a rash.  Sores on the glans and on the foreskin. DIAGNOSIS Diagnosis of balanitis is confirmed through a physical exam. TREATMENT The treatment is based on the cause of the balanitis. Treatment may include:  Frequent cleansing.  Keeping the glans and foreskin dry.  Use of medicines such as creams, pain medicines, antibiotics, or medicines to treat fungal infections.  Sitz baths. If the irritation has caused a scar on the foreskin that prevents easy retraction, a circumcision may be recommended.  HOME CARE INSTRUCTIONS  Sex should be avoided until the condition has cleared. MAKE SURE YOU:  Understand these instructions.  Will watch your condition.  Will get help right away if you are not doing well or get worse. Document Released: 09/26/2008 Document Revised: 05/15/2013 Document Reviewed: 10/30/2012 [facility] Patient Information 2015 [place], [place]. This information is not intended to replace advice given to you by your health care provider. Make sure you discuss any questions you have with your health care provider.

## 2014-08-09 ENCOUNTER — Encounter: Payer: Self-pay | Admitting: Internal Medicine

## 2014-08-09 DIAGNOSIS — Z599 Problem related to housing and economic circumstances, unspecified: Secondary | ICD-10-CM | POA: Insufficient documentation

## 2014-08-09 DIAGNOSIS — R059 Cough, unspecified: Secondary | ICD-10-CM

## 2014-08-09 DIAGNOSIS — N481 Balanitis: Secondary | ICD-10-CM | POA: Insufficient documentation

## 2014-08-09 DIAGNOSIS — Z598 Other problems related to housing and economic circumstances: Secondary | ICD-10-CM | POA: Insufficient documentation

## 2014-08-09 DIAGNOSIS — R05 Cough: Secondary | ICD-10-CM | POA: Insufficient documentation

## 2014-08-09 HISTORY — DX: Balanitis: N48.1

## 2014-08-09 HISTORY — DX: Problem related to housing and economic circumstances, unspecified: Z59.9

## 2014-08-09 HISTORY — DX: Cough, unspecified: R05.9

## 2014-08-09 NOTE — Assessment & Plan Note (Addendum)
Lab Results  Component Value Date   HGBA1C >14.0 08/08/2014   HGBA1C 13.9 02/28/2014   HGBA1C 14.9* 08/10/2013     Assessment: Diabetes control: poor control (HgbA1C >9%) Progress toward A1C goal:  deteriorated Comments: uncontrolled intermittently compliant with meds   Plan: Medications:  continue current medications (Metformin 1000 mg bid, 70/30 35 units bid), bring meter to next visit for review, encouraged compliance  Home glucose monitoring: Frequency: 2 times a day Timing: before meals Instruction/counseling given: reminded to bring blood glucose meter & log to each visit Other plans: A1C worse, ? Med compliance, will review meter at f/u

## 2014-08-09 NOTE — Assessment & Plan Note (Signed)
Controlled.  

## 2014-08-09 NOTE — Assessment & Plan Note (Signed)
Will get social work and [employer].

## 2014-08-09 NOTE — Assessment & Plan Note (Signed)
Encouraged exercise to lose

## 2014-08-09 NOTE — Assessment & Plan Note (Signed)
BP Readings from Last 3 Encounters:  08/08/14 140/100  02/28/14 143/105  02/08/14 152/92    Lab Results  Component Value Date   NA 134* 08/08/2014   K 4.4 08/08/2014   CREATININE 0.96 08/08/2014    Assessment: Blood pressure control: moderately elevated Progress toward BP goal:  deteriorated Comments: uncontrolled   Plan: Medications:  continue current medications (increase Lisinopril 10 to 20 mg) Other plans: BMET

## 2014-08-09 NOTE — Assessment & Plan Note (Signed)
Xray neg 04/2014 could be rotator cuff etiology  Will give Tramadol 100 mg bid prn When gets insurance will try to refer to sports medicine

## 2014-08-09 NOTE — Progress Notes (Signed)
Internal Medicine Clinic Attending  I saw and evaluated the patient.  I personally confirmed the key portions of the history and exam documented by Dr. Aundra Dubin and I reviewed pertinent patient test results.  The assessment, diagnosis, and plan were formulated together and I agree with the documentation in the resident's note.  Would encourage close follow-up in the near future so his compliance with diabetes regimen can be assessed and his regimen adjusted appropriately.

## 2014-08-09 NOTE — Assessment & Plan Note (Signed)
HM-due for HIV, Tdap, flu, colonoscopy, Foot exam 08/2014 currently no insurance

## 2014-08-09 NOTE — Assessment & Plan Note (Signed)
Encouraged pt to take Lipitor

## 2014-08-09 NOTE — Assessment & Plan Note (Addendum)
Likely due to fungus/bacteria. Pt having some trouble retracting could be development of phimosis no evidence of paraphimosis Will try to tx w/ Diflucan 150 x 1, Augmentin 500-125 tid x 7 days. If not better consider screening for STDs RTC 1-2 weeks  If not better or sx's or signs of paraphimosis refer for urgent urology appt for circumcision

## 2014-08-09 NOTE — Progress Notes (Signed)
Subjective:    Patient ID: Allen Gould, male    DOB: [birth year], 49 y.o.   MRN: [id number]  HPI Comments: 49 y.o with uncontrolled HTN, uncontrolled DM, HLD (not taking Lipitor) obesity, asthma  He presents for f/u with metabolic syndrome  1. Uncontrolled DM last A1C 13.9 02/2014 w/o f/u since then.  Today >14.  He forgot his meter today.  He states he takes Metformin 1000 mg bid and intermittently takes 70/30 35 bid but has Rx at home for both.  His cbg today is 442 fasting though he states he was thirsty before the visit and had a slushy.  At home he states he gets readings in the 230s.  She denies increased thirst, polyuria, but has had some blurry vision esp at night.  He denies ab pain, nausea/vomiting  2. Uncontrolled HTN-on Lisinopril 10 mg qd. BP initially 185/112 with repeat 140/100.    3.  He c/o new x 2 weeks soreness and itching around the foreskin of the tip of his penis.  He is not circumcised.  He states the area was also pink and he has trouble intermittently retracting the skin to urinate.  He tried to clean with a Q tip and soap and triple Abx oint.  He denies pus, dysuria, trouble urinating.  Nothing makes the sx's worse or better.  Reviewed urine dipstick with trace ketones, neg nit, neg leu, gluc 500, neg protein  4. Right shoulder pain, chronic.  7/10 today.  Relief with Tramadol.  Prior imaging (Xray) negative 04/2014.    5. Obesity-wt up 310 to 319. Counseled on exercise to lose.    6. He states he has had a cough x 2-3 weeks with green mucous. Denies fever.  He tried Insurance claims handler plus.  He denies sick contacts.    HM-due for HIV, Tdap, flu, colonoscopy, Foot exam 08/2014 currently no insurance  SH: works 20-25 hrs at the [employer] and seasonally at [employer].  He broke up w/ fiance and now has another GF. He recently moved out of [place].      Review of Systems  Constitutional: Negative for fever.  Respiratory: Positive for cough. Negative for  shortness of breath.   Gastrointestinal: Negative for nausea, vomiting and abdominal pain.  Endocrine: Negative for polydipsia and polyuria.  Genitourinary: Positive for penile pain. Negative for dysuria and difficulty urinating.  Musculoskeletal: Positive for arthralgias.       Objective:   Physical Exam  Constitutional: He is oriented to person, place, and time. He appears well-developed and well-nourished. He is cooperative.  HENT:  Head: Normocephalic and atraumatic.  Mouth/Throat: No oropharyngeal exudate.  Eyes: Conjunctivae are normal. Right eye exhibits no discharge. Left eye exhibits no discharge. No scleral icterus.  Cardiovascular: Normal rate, regular rhythm and normal heart sounds.   No murmur heard. Trace leg edema b/l  Pulmonary/Chest: Effort normal. He has wheezes.  Mild wheezing b/l   Abdominal: Soft. Bowel sounds are normal. He exhibits no distension. There is no tenderness.  Genitourinary:    Uncircumcised. No paraphimosis.  Musculoskeletal: He exhibits no edema.       Right shoulder: He exhibits tenderness.       Arms: Neurological: He is alert and oriented to person, place, and time. Gait normal.  Skin: Skin is warm and dry.  Psychiatric: He has a normal mood and affect. His speech is normal and behavior is normal. Judgment and thought content normal. Cognition and memory are normal.  Nursing note and  vitals reviewed.         Assessment & Plan:  F/u in 1-2 weeks sooner if needed

## 2014-08-09 NOTE — Assessment & Plan Note (Signed)
Likely URI Asked pt to try Mucinex RTC if worsening with fever then consider CXR r/o pna

## 2014-08-12 ENCOUNTER — Encounter: Payer: Self-pay | Admitting: Licensed Clinical Social Worker

## 2014-08-12 ENCOUNTER — Telehealth: Payer: Self-pay | Admitting: Licensed Clinical Social Worker

## 2014-08-12 NOTE — Telephone Encounter (Signed)
[name] was referred to [facility] as pt is uninsured and having difficulty affording his medication.  [name] placed call to [name].  Pt states the majority of his medication are on the discounted pharmacy list.  However, he has difficulty affording his Humulin 70/30. [name] is currently residing in [place].  [name] discussed either the [facility] MAP program or [employer].  Pt states most likely will use [facility] since he is thinking about relocating to [facility].  [name] will mail both application to [name] with a self addressed, stamped envelope for return.  Once [name] receives completed application, [name] will proceed with PCP.

## 2014-08-19 ENCOUNTER — Ambulatory Visit: Payer: Self-pay | Admitting: Internal Medicine

## 2014-08-26 ENCOUNTER — Encounter: Payer: Self-pay | Admitting: Internal Medicine

## 2014-08-26 ENCOUNTER — Ambulatory Visit (INDEPENDENT_AMBULATORY_CARE_PROVIDER_SITE_OTHER): Payer: Self-pay | Admitting: Internal Medicine

## 2014-08-26 VITALS — BP 183/123 | HR 90 | Temp 98.1°F | Wt 322.1 lb

## 2014-08-26 DIAGNOSIS — E1110 Type 2 diabetes mellitus with ketoacidosis without coma: Secondary | ICD-10-CM

## 2014-08-26 DIAGNOSIS — I10 Essential (primary) hypertension: Secondary | ICD-10-CM

## 2014-08-26 DIAGNOSIS — N481 Balanitis: Secondary | ICD-10-CM

## 2014-08-26 DIAGNOSIS — E785 Hyperlipidemia, unspecified: Secondary | ICD-10-CM

## 2014-08-26 DIAGNOSIS — E1310 Other specified diabetes mellitus with ketoacidosis without coma: Secondary | ICD-10-CM

## 2014-08-26 DIAGNOSIS — I160 Hypertensive urgency: Secondary | ICD-10-CM

## 2014-08-26 LAB — GLUCOSE, CAPILLARY: Glucose-Capillary: 222 mg/dL — ABNORMAL HIGH (ref 70–99)

## 2014-08-26 MED ORDER — LISINOPRIL 20 MG PO TABS
20.0000 mg | ORAL_TABLET | Freq: Once | ORAL | Status: AC
  Administered 2014-08-26: 20 mg via ORAL

## 2014-08-26 MED ORDER — METFORMIN HCL 1000 MG PO TABS: 1000.0000 mg | ORAL_TABLET | Freq: Two times a day (BID) | ORAL | Status: DC

## 2014-08-26 MED ORDER — LISINOPRIL 20 MG PO TABS
20.0000 mg | ORAL_TABLET | Freq: Once | ORAL | Status: DC
  Filled 2014-08-26: qty 1

## 2014-08-26 MED ORDER — LISINOPRIL 40 MG PO TABS: 40.0000 mg | ORAL_TABLET | Freq: Every day | ORAL | Status: DC

## 2014-08-26 MED ORDER — AMLODIPINE BESYLATE 5 MG PO TABS: 5.0000 mg | ORAL_TABLET | Freq: Every day | ORAL | Status: DC

## 2014-08-26 MED ORDER — INSULIN NPH ISOPHANE & REGULAR (70-30) 100 UNIT/ML ~~LOC~~ SUSP: 25.0000 [IU] | Freq: Two times a day (BID) | SUBCUTANEOUS | Status: DC

## 2014-08-26 NOTE — Addendum Note (Signed)
Encounter addended by: Lavenia Atlas, RPH on: 08/26/2014  4:03 PM<BR>     Documentation filed: Orders, Rx Order Verification

## 2014-08-26 NOTE — Patient Instructions (Addendum)
1. Please increase metformin this week to 1000mg  in the morning and 500mg  in the evening with meals.  Next week increase to 1000mg  twice per day with meals.  The metformin is free at [facility].  Please increase your insulin to 25 units twice per day with meals.  Please work on light exercise (like walking) and do not add salt to food.  Also, replace Gatorade with water.  Please keep a log of your blood sugars and come back to see Dr. Aundra Dubin in 2-4 weeks.  I have increased your lisinopril from 20mg  daily to 40mg  daily.  Please start taking 40mg  daily tomorrow.  The [facility] prescription [facility] at [facility].   I have added amlodipine 5mg  to help control your blood pressure.  I have given you a paper prescription to take to [facility] (this medication is $4 at [facility]).   2. Please take all medications as prescribed.    3. If you have worsening of your symptoms or new symptoms arise, please call the clinic ([phone]), or go to the ER immediately if symptoms are severe.  Please come back to see Dr. Aundra Dubin in 2 weeks to check your blood pressure and blood sugar.    DASH Eating Plan DASH stands for "Dietary Approaches to Stop Hypertension." The DASH eating plan is a healthy eating plan that has been shown to reduce high blood pressure (hypertension). Additional health benefits may include reducing the risk of type 2 diabetes mellitus, heart disease, and stroke. The DASH eating plan may also help with weight loss. WHAT DO I NEED TO KNOW ABOUT THE DASH EATING PLAN? For the DASH eating plan, you will follow these general guidelines:  Choose foods with a percent daily value for sodium of less than 5% (as listed on the food label).  Use salt-free seasonings or herbs instead of table salt or sea salt.  Check with your health care provider or pharmacist before using salt substitutes.  Eat lower-sodium products, often labeled as "lower sodium" or "no salt added."  Eat fresh foods.  Eat more  vegetables, fruits, and low-fat dairy products.  Choose whole grains. Look for the word "whole" as the first word in the ingredient list.  Choose fish and skinless chicken or [place] more often than red meat. Limit fish, poultry, and meat to 6 oz (170 g) each day.  Limit sweets, desserts, sugars, and sugary drinks.  Choose heart-healthy fats.  Limit cheese to 1 oz (28 g) per day.  Eat more home-cooked food and less restaurant, buffet, and fast food.  Limit fried foods.  Cook foods using methods other than frying.  Limit canned vegetables. If you do use them, rinse them well to decrease the sodium.  When eating at a restaurant, ask that your food be prepared with less salt, or no salt if possible. WHAT FOODS CAN I EAT? Seek help from a dietitian for individual calorie needs. Grains Whole grain or whole wheat bread. Brown rice. Whole grain or whole wheat pasta. Quinoa, bulgur, and whole grain cereals. Low-sodium cereals. Corn or whole wheat flour tortillas. Whole grain cornbread. Whole grain crackers. Low-sodium crackers. Vegetables Fresh or frozen vegetables (raw, steamed, roasted, or grilled). Low-sodium or reduced-sodium tomato and vegetable juices. Low-sodium or reduced-sodium tomato sauce and paste. Low-sodium or reduced-sodium canned vegetables.  Fruits All fresh, canned (in natural juice), or frozen fruits. Meat and Other Protein Products Ground beef (85% or leaner), grass-fed beef, or beef trimmed of fat. Skinless chicken or [place]. Ground chicken  or [place]. Pork trimmed of fat. All fish and seafood. Eggs. Dried beans, peas, or lentils. Unsalted nuts and seeds. Unsalted canned beans. Dairy Low-fat dairy products, such as skim or 1% milk, 2% or reduced-fat cheeses, low-fat ricotta or cottage cheese, or plain low-fat yogurt. Low-sodium or reduced-sodium cheeses. Fats and Oils Tub margarines without trans fats. Light or reduced-fat mayonnaise and salad dressings (reduced sodium).  Avocado. Safflower, olive, or canola oils. Natural peanut or almond butter. Other Unsalted popcorn and pretzels. The items listed above may not be a complete list of recommended foods or beverages. Contact your dietitian for more options. WHAT FOODS ARE NOT RECOMMENDED? Grains White bread. White pasta. White rice. Refined cornbread. Bagels and croissants. Crackers that contain trans fat. Vegetables Creamed or fried vegetables. Vegetables in a cheese sauce. Regular canned vegetables. Regular canned tomato sauce and paste. Regular tomato and vegetable juices. Fruits Dried fruits. Canned fruit in light or heavy syrup. Fruit juice. Meat and Other Protein Products Fatty cuts of meat. Ribs, chicken wings, bacon, sausage, bologna, salami, chitterlings, fatback, hot dogs, bratwurst, and packaged luncheon meats. Salted nuts and seeds. Canned beans with salt. Dairy Whole or 2% milk, cream, half-and-half, and cream cheese. Whole-fat or sweetened yogurt. Full-fat cheeses or blue cheese. Nondairy creamers and whipped toppings. Processed cheese, cheese spreads, or cheese curds. Condiments Onion and garlic salt, seasoned salt, table salt, and sea salt. Canned and packaged gravies. Worcestershire sauce. Tartar sauce. Barbecue sauce. Teriyaki sauce. Soy sauce, including reduced sodium. Steak sauce. Fish sauce. Oyster sauce. Cocktail sauce. Horseradish. Ketchup and mustard. Meat flavorings and tenderizers. Bouillon cubes. Hot sauce. Tabasco sauce. Marinades. Taco seasonings. Relishes. Fats and Oils Butter, stick margarine, lard, shortening, ghee, and bacon fat. Coconut, palm kernel, or palm oils. Regular salad dressings. Other Pickles and olives. Salted popcorn and pretzels. The items listed above may not be a complete list of foods and beverages to avoid. Contact your dietitian for more information. WHERE CAN I FIND MORE INFORMATION? National Heart, Lung, and Blood Institute:  travelstabloid.com Document Released: 04/29/2011 Document Revised: 09/24/2013 Document Reviewed: 03/14/2013 [facility] Patient Information 2015 [place], [place]. This information is not intended to replace advice given to you by your health care provider. Make sure you discuss any questions you have with your health care provider.

## 2014-08-26 NOTE — Progress Notes (Signed)
   Subjective:    Patient ID: Allen Gould, male    DOB: 01-06-1966, 49 y.o.   MRN: [id number]  HPI Comments: Ms. Curl is a 49 year old male with PMH as below here for follow-up HTN and balanitis. Please see problem based assessment and plan for details.        Past Medical History  Diagnosis Date  . Diabetes mellitus without complication   . Hypertension   . Asthma   . GERD (gastroesophageal reflux disease)     Review of Systems  Constitutional: Negative for fever, chills and appetite change.  HENT: Negative for congestion.   Eyes: Positive for visual disturbance.       Sometimes blurry vision with bright lights at night.   Respiratory: Positive for cough. Negative for shortness of breath.   Cardiovascular: Negative for chest pain, palpitations and leg swelling.  Gastrointestinal: Positive for nausea. Negative for vomiting and blood in stool.  Genitourinary: Negative for dysuria, hematuria, discharge, penile swelling, scrotal swelling, difficulty urinating, penile pain and testicular pain.       Filed Vitals:   08/26/14 1424 08/26/14 1440 08/26/14 1537  BP: 217/134 175/118 183/123  Pulse: 98 93 90  Temp: 98.1 F (36.7 C)    TempSrc: Oral    Weight: 322 lb 1.6 oz (146.104 kg)    SpO2: 99%      Objective:   Physical Exam  Constitutional: He is oriented to person, place, and time. He appears well-developed. No distress.  HENT:  Head: Normocephalic and atraumatic.  Mouth/Throat: Oropharynx is clear and moist. No oropharyngeal exudate.  Eyes: EOM are normal. Pupils are equal, round, and reactive to light.  Neck: Neck supple.  Cardiovascular: Normal rate, regular rhythm and normal heart sounds.  Exam reveals no gallop and no friction rub.   No murmur heard. Pulmonary/Chest: Effort normal and breath sounds normal. No respiratory distress. He has no wheezes. He has no rales. He exhibits no tenderness.  Abdominal: Soft. Bowel sounds are normal. He exhibits no distension.  There is no tenderness. There is no rebound and no guarding.  Central abdominal adiposity  Musculoskeletal: Normal range of motion. He exhibits no edema or tenderness.  Neurological: He is alert and oriented to person, place, and time. No cranial nerve deficit.  Skin: Skin is warm. He is not diaphoretic.  Psychiatric: He has a normal mood and affect. His behavior is normal.  Vitals reviewed.         Assessment & Plan:  Please see problem based assessment and plan.

## 2014-08-26 NOTE — Assessment & Plan Note (Addendum)
Lab Results  Component Value Date   HGBA1C >14.0 08/08/2014   HGBA1C 13.9 02/28/2014   HGBA1C 14.9* 08/10/2013     Assessment: Diabetes control:  uncontrolled  Progress toward A1C goal:   deteriorated Comments: Meter download reviewed and CBGs are elevated well above 300.  He reports taking metformin 500mg  BID (initially says that is what is on his med bottles despite rx for 1000mg  BID in our system, but later admitted to nurse that he has been using his girlfriend's metformin 500mg  BID because he cannot afford to pick up his rx).  He says he has been taking 20 units insulin in the AM and 15 units in the PM and NOT 35 units BID that was prescribed.   Plan: Medications:  continue current medications:  INCREASE metformin to 1000mg  BID (advised to increase to 1000mg  in AM and 500mg  in PM this week and then increase to 1000mg  BID next week).  INCREASE humulin 70/30 from 20 units in AM and 15 units in PM to 25 units BID.  He says he is able to obtain his insulin. Home glucose monitoring:  BID Frequency:   Timing:   Instruction/counseling given: reminded to bring blood glucose meter & log to each visit Other plans:  He was provided with paper rx for metformin and instructed to take it to his nearest [facility] (this medication is free at [facility]).  We will need to obtain location of his nearest HT (he lives outside of [place]) for future e-prescribing.  He agrees to this plan.  RTC in 2 week.

## 2014-08-26 NOTE — Assessment & Plan Note (Addendum)
BP Readings from Last 3 Encounters:  08/26/14 175/118  08/08/14 140/100  02/28/14 143/105    Lab Results  Component Value Date   NA 134* 08/08/2014   K 4.4 08/08/2014   CREATININE 0.96 08/08/2014    Assessment: Blood pressure control:  uncontrolled  Progress toward BP goal:   deteriorated Comments: Compliant with lisinopril but says he forgets to take it 1-2x per week.  Cooking with salt at home.  Drinking gatorade.  Given the degree of HTN today and at recent visit he will likely benefit from two drug BP regimen.  Unfortunately, he is uninsured, cannot afford A[employer] plan and does not qualify for [employer] (lives outside of [place]) so he is paying cash for medications.  He says he has not been at his job long enough for insurance coverage (about six months, no insurance until 1 year).      Plan: Medications:  continue current medications:  INCREASE Lisinopril from 20mg  daily to 40mg  daily ($4 at [facility]); ADD amlodipine 5mg  daily ($4 at [facility]) Educational resources provided:  DASH diet info Other plans: BMP.  He was given an additional 20mg  of lisinopril in clinic due to asymptomatic HTN (he says he has already taken 20mg  at home today).  His BPs are likely elevated most of the time, he is asymptomatic and there is no need for acute lowering of BP.  His BP should be lowered gradually over days.  He was advised to d/c gatorade and salt and try salt-free (ie Mrs. Dash) seasoning.  He was advised to add light exercise (ie walking) for weight loss and its impact on BP (he has gained 20 pounds since last year).  He was provided with a paper rx to take to [facility] (he lives outside of [place] and I do not want to prescribe to wrong [facility]; will need to get address of HT closest to his house for future e-prescribing).  He says he will be able to afford additional $4 medication.  If BP remains high, can consider addition of low dose diuretic (I did not want to add HCTZ today  because I was increasing ACEI).  RTC in 2 weeks.

## 2014-08-27 NOTE — Assessment & Plan Note (Addendum)
Patient says he has not picked up Lipitor because he cannot afford it at [facility].  I have made several changes today so I will hold off on new statin rx but I would recommend prescribing lovastatin for patient in the future since it is on the [facility] $4 list.  - will defer to PCP to discuss statin change

## 2014-08-27 NOTE — Assessment & Plan Note (Signed)
He says he took diflucan and completed course of Augmentin as prescribed and symptoms have resolved.  He says he is able to retract the foreskin.

## 2014-08-27 NOTE — Progress Notes (Signed)
Case discussed with Dr. Redmond Pulling at time of visit. We reviewed the resident's history and exam and pertinent patient test results. I agree with the assessment, diagnosis, and plan of care documented in the resident's note.

## 2014-08-30 ENCOUNTER — Telehealth: Payer: Self-pay | Admitting: Licensed Clinical Social Worker

## 2014-08-30 NOTE — Telephone Encounter (Signed)
[name] received [name] signed and completed [employer] application.  However, application missing federal tax return information.  [name] placed call to [name] to inquire if pt files federal taxes, pt states he does and will bring copies to [place] next time he is in [place].  Pt aware once federal tax return information is brought in, [name] will fax information to [facility].

## 2014-09-03 ENCOUNTER — Other Ambulatory Visit: Payer: Self-pay | Admitting: Internal Medicine

## 2014-09-03 DIAGNOSIS — I10 Essential (primary) hypertension: Secondary | ICD-10-CM

## 2014-09-19 ENCOUNTER — Telehealth: Payer: Self-pay | Admitting: Internal Medicine

## 2014-09-19 NOTE — Telephone Encounter (Signed)
Call to patient to confirm appointment for 09/20/14 at 10:45 phone number is not valid

## 2014-09-20 ENCOUNTER — Encounter: Payer: Self-pay | Admitting: Internal Medicine

## 2014-09-20 ENCOUNTER — Ambulatory Visit: Payer: Self-pay | Admitting: Internal Medicine

## 2014-10-09 ENCOUNTER — Encounter: Payer: Self-pay | Admitting: *Deleted

## 2014-11-18 ENCOUNTER — Other Ambulatory Visit: Payer: Self-pay

## 2015-06-12 ENCOUNTER — Encounter: Payer: Self-pay | Admitting: Internal Medicine

## 2015-11-05 ENCOUNTER — Encounter: Payer: Self-pay | Admitting: *Deleted

## 2016-10-22 DIAGNOSIS — I10 Essential (primary) hypertension: Secondary | ICD-10-CM | POA: Diagnosis present

## 2017-05-28 ENCOUNTER — Inpatient Hospital Stay (HOSPITAL_COMMUNITY)
Admission: EM | Admit: 2017-05-28 | Discharge: 2017-05-31 | DRG: 872 | Disposition: A | Discharge status: Home / Self Care | Payer: Self-pay | Attending: Family Medicine | Admitting: Family Medicine

## 2017-05-28 ENCOUNTER — Other Ambulatory Visit: Payer: Self-pay

## 2017-05-28 ENCOUNTER — Emergency Department (HOSPITAL_COMMUNITY): Payer: Self-pay

## 2017-05-28 ENCOUNTER — Encounter (HOSPITAL_COMMUNITY): Payer: Self-pay | Admitting: Emergency Medicine

## 2017-05-28 DIAGNOSIS — Z23 Encounter for immunization: Secondary | ICD-10-CM

## 2017-05-28 DIAGNOSIS — Z91013 Allergy to seafood: Secondary | ICD-10-CM

## 2017-05-28 DIAGNOSIS — I4510 Unspecified right bundle-branch block: Secondary | ICD-10-CM | POA: Diagnosis present

## 2017-05-28 DIAGNOSIS — E860 Dehydration: Secondary | ICD-10-CM | POA: Diagnosis present

## 2017-05-28 DIAGNOSIS — R55 Syncope and collapse: Secondary | ICD-10-CM | POA: Diagnosis present

## 2017-05-28 DIAGNOSIS — N179 Acute kidney failure, unspecified: Secondary | ICD-10-CM | POA: Diagnosis present

## 2017-05-28 DIAGNOSIS — Z833 Family history of diabetes mellitus: Secondary | ICD-10-CM

## 2017-05-28 DIAGNOSIS — E871 Hypo-osmolality and hyponatremia: Secondary | ICD-10-CM | POA: Diagnosis present

## 2017-05-28 DIAGNOSIS — A419 Sepsis, unspecified organism: Principal | ICD-10-CM | POA: Diagnosis present

## 2017-05-28 DIAGNOSIS — Z7982 Long term (current) use of aspirin: Secondary | ICD-10-CM

## 2017-05-28 DIAGNOSIS — Z79899 Other long term (current) drug therapy: Secondary | ICD-10-CM

## 2017-05-28 DIAGNOSIS — E1165 Type 2 diabetes mellitus with hyperglycemia: Secondary | ICD-10-CM | POA: Diagnosis present

## 2017-05-28 DIAGNOSIS — E876 Hypokalemia: Secondary | ICD-10-CM | POA: Diagnosis present

## 2017-05-28 DIAGNOSIS — Z86711 Personal history of pulmonary embolism: Secondary | ICD-10-CM

## 2017-05-28 DIAGNOSIS — Z79891 Long term (current) use of opiate analgesic: Secondary | ICD-10-CM

## 2017-05-28 DIAGNOSIS — K219 Gastro-esophageal reflux disease without esophagitis: Secondary | ICD-10-CM | POA: Diagnosis present

## 2017-05-28 DIAGNOSIS — Z86718 Personal history of other venous thrombosis and embolism: Secondary | ICD-10-CM

## 2017-05-28 DIAGNOSIS — I10 Essential (primary) hypertension: Secondary | ICD-10-CM | POA: Diagnosis present

## 2017-05-28 DIAGNOSIS — E119 Type 2 diabetes mellitus without complications: Secondary | ICD-10-CM | POA: Diagnosis present

## 2017-05-28 DIAGNOSIS — Z87891 Personal history of nicotine dependence: Secondary | ICD-10-CM

## 2017-05-28 DIAGNOSIS — E1110 Type 2 diabetes mellitus with ketoacidosis without coma: Secondary | ICD-10-CM

## 2017-05-28 DIAGNOSIS — M272 Inflammatory conditions of jaws: Secondary | ICD-10-CM | POA: Diagnosis present

## 2017-05-28 DIAGNOSIS — I4891 Unspecified atrial fibrillation: Secondary | ICD-10-CM | POA: Diagnosis present

## 2017-05-28 DIAGNOSIS — K047 Periapical abscess without sinus: Secondary | ICD-10-CM | POA: Diagnosis present

## 2017-05-28 DIAGNOSIS — Z794 Long term (current) use of insulin: Secondary | ICD-10-CM

## 2017-05-28 DIAGNOSIS — E785 Hyperlipidemia, unspecified: Secondary | ICD-10-CM | POA: Diagnosis present

## 2017-05-28 DIAGNOSIS — I160 Hypertensive urgency: Secondary | ICD-10-CM | POA: Diagnosis present

## 2017-05-28 DIAGNOSIS — Z6841 Body Mass Index (BMI) 40.0 and over, adult: Secondary | ICD-10-CM

## 2017-05-28 LAB — CBC
HCT: 36.8 % — ABNORMAL LOW (ref 39.0–52.0)
Hemoglobin: 12.5 g/dL — ABNORMAL LOW (ref 13.0–17.0)
MCH: 34.8 pg — ABNORMAL HIGH (ref 26.0–34.0)
MCHC: 34 g/dL (ref 30.0–36.0)
MCV: 102.5 fL — ABNORMAL HIGH (ref 78.0–100.0)
Platelets: 226 10*3/uL (ref 150–400)
RBC: 3.59 MIL/uL — ABNORMAL LOW (ref 4.22–5.81)
RDW: 13.8 % (ref 11.5–15.5)
WBC: 11.9 10*3/uL — ABNORMAL HIGH (ref 4.0–10.5)

## 2017-05-28 LAB — URINALYSIS, ROUTINE W REFLEX MICROSCOPIC
Bacteria, UA: NONE SEEN
Bilirubin Urine: NEGATIVE
Glucose, UA: 50 mg/dL — AB
Hgb urine dipstick: NEGATIVE
Ketones, ur: 5 mg/dL — AB
Leukocytes, UA: NEGATIVE
Nitrite: NEGATIVE
Protein, ur: 30 mg/dL — AB
Specific Gravity, Urine: 1.017 (ref 1.005–1.030)
pH: 5 (ref 5.0–8.0)

## 2017-05-28 LAB — I-STAT TROPONIN, ED
Troponin i, poc: 0.03 ng/mL (ref 0.00–0.08)
Troponin i, poc: 0.04 ng/mL (ref 0.00–0.08)

## 2017-05-28 LAB — BASIC METABOLIC PANEL
Anion gap: 12 (ref 5–15)
BUN: 7 mg/dL (ref 6–20)
CO2: 21 mmol/L — ABNORMAL LOW (ref 22–32)
Calcium: 8.2 mg/dL — ABNORMAL LOW (ref 8.9–10.3)
Chloride: 99 mmol/L — ABNORMAL LOW (ref 101–111)
Creatinine, Ser: 1.33 mg/dL — ABNORMAL HIGH (ref 0.61–1.24)
GFR calc Af Amer: >60 mL/min (ref >60)
GFR calc non Af Amer: 60 mL/min — ABNORMAL LOW (ref >60)
Glucose, Bld: 140 mg/dL — ABNORMAL HIGH (ref 65–99)
Potassium: 2.9 mmol/L — ABNORMAL LOW (ref 3.5–5.1)
Sodium: 132 mmol/L — ABNORMAL LOW (ref 135–145)

## 2017-05-28 MED ORDER — SODIUM CHLORIDE 0.9 % IV BOLUS (SEPSIS)
1000.0000 mL | Freq: Once | INTRAVENOUS | Status: AC
  Administered 2017-05-28: 1000 mL via INTRAVENOUS

## 2017-05-28 MED ORDER — PENICILLIN V POTASSIUM 250 MG PO TABS
500.0000 mg | ORAL_TABLET | Freq: Once | ORAL | Status: AC
  Administered 2017-05-28: 500 mg via ORAL
  Filled 2017-05-28: qty 2

## 2017-05-28 MED ORDER — POTASSIUM CHLORIDE CRYS ER 20 MEQ PO TBCR
40.0000 meq | EXTENDED_RELEASE_TABLET | Freq: Once | ORAL | Status: AC
  Administered 2017-05-28: 40 meq via ORAL
  Filled 2017-05-28: qty 2

## 2017-05-28 MED ORDER — HYDROCODONE-ACETAMINOPHEN 5-325 MG PO TABS
1.0000 | ORAL_TABLET | Freq: Once | ORAL | Status: AC
  Administered 2017-05-28: 1 via ORAL
  Filled 2017-05-28: qty 1

## 2017-05-28 NOTE — ED Provider Notes (Signed)
[facility] EMERGENCY DEPARTMENT Provider Note   CSN: [id number] Arrival date & time: 05/28/17  1821     History   Chief Complaint Chief Complaint  Patient presents with  . Near Syncope    HPI Allen Gould is a 52 y.o. male with past medical history of hypertension, asthma, diabetes, GERD, hyperlipidemia, presenting to the ED with a near syncopal episode.  Patient states he was at work, not exerting himself, when he began feeling lightheaded and sweaty.  He states he sat down because he felt like he would pass out.  He denies a syncopal episode.  States he called EMS, with EKG showing sinus tachycardia with PACs (contrary to triage note reporting atrial fibrillation).  During this episode, he denies chest pain, shortness of breath, nausea.  States he currently feels general weakness. Reports a second complaint of right-sided upper dental pain for multiple days.  Has not been taking anything for his pain.  Does not have a local dentist. Decreased p.o. intake secondary to pain. No fever, difficulty swallowing or breathing, unilateral lower extremity pain, hx DVT/PE, recent trauma/surgery, hemoptysis, melena. The history is provided by the patient.    Past Medical History:  Diagnosis Date  . Asthma   . Diabetes mellitus without complication ([place])   . GERD (gastroesophageal reflux disease)   . Hypertension     Patient Active Problem List   Diagnosis Date Noted  . Cough 08/09/2014  . Balanitis 08/09/2014  . Financial difficulties 08/09/2014  . Health care maintenance 03/01/2014  . Dyslipidemia 03/01/2014  . Morbid obesity ([place]) 08/29/2013  . Right shoulder pain 08/29/2013  . Insomnia 08/29/2013  . DM (diabetes mellitus) type 2, uncontrolled, without ketoacidosis 08/10/2013  . Hypertension   . Asthma   . GERD (gastroesophageal reflux disease)     Past Surgical History:  Procedure Laterality Date  . APPENDECTOMY         Home Medications    Prior to  Admission medications   Medication Sig Start Date End Date Taking? Authorizing Provider  aspirin 325 MG tablet Take 325 mg by mouth daily as needed for moderate pain.   Yes [provider]  ibuprofen (ADVIL,MOTRIN) 200 MG tablet Take 400 mg by mouth every 6 (six) hours as needed.   Yes [provider]  amLODipine (NORVASC) 5 MG tablet Take 1 tablet (5 mg total) by mouth daily. Patient not taking: Reported on 05/28/2017 08/26/14 08/26/15  Francesca Oman, DO  atorvastatin (LIPITOR) 40 MG tablet Take 1 tablet (40 mg total) by mouth daily at 6 PM. Patient not taking: Reported on 08/09/2014 03/04/14   McLean-Scocuzza, Nino Glow, MD  insulin NPH-regular Human (HUMULIN 70/30) (70-30) 100 UNIT/ML injection Inject 25 Units into the skin 2 (two) times daily with a meal. 08/26/14   Francesca Oman, DO  lisinopril (PRINIVIL,ZESTRIL) 40 MG tablet Take 1 tablet (40 mg total) by mouth daily. Patient not taking: Reported on 05/28/2017 08/26/14   Francesca Oman, DO  metFORMIN (GLUCOPHAGE) 1000 MG tablet Take 1 tablet (1,000 mg total) by mouth 2 (two) times daily with a meal. Patient not taking: Reported on 05/28/2017 08/26/14   Francesca Oman, DO  traMADol (ULTRAM) 50 MG tablet Take 2 tablets (100 mg total) by mouth 2 (two) times daily as needed. Patient not taking: Reported on 05/28/2017 08/08/14   McLean-Scocuzza, Nino Glow, MD    Family History Family History  Problem Relation Age of Onset  . Kidney disease Mother   .  Diabetes Sister   . Hyperlipidemia Sister   . Heart disease Other     Social History Social History   Tobacco Use  . Smoking status: Former Smoker    Types: Cigarettes  . Smokeless tobacco: Never Used  . Tobacco comment: 1 cigerette a dayb 2-3 cigerettes per week  Substance Use Topics  . Alcohol use: Yes    Alcohol/week: 1.2 oz    Types: 2 Cans of beer per week    Comment: every other day  . Drug use: No     Allergies   Shellfish allergy   Review of Systems Review of Systems    Constitutional: Positive for diaphoresis. Negative for fever.  HENT: Positive for dental problem. Negative for sore throat, trouble swallowing and voice change.   Respiratory: Negative for shortness of breath.   Cardiovascular: Negative for chest pain, palpitations and leg swelling.  Gastrointestinal: Negative for abdominal pain and nausea.  Neurological: Positive for weakness (Generalized) and light-headedness.  All other systems reviewed and are negative.    Physical Exam Updated Vital Signs BP (!) 163/147   Pulse (!) 121   Temp 98.8 F (37.1 C) (Oral)   Resp (!) 21   Ht 5\' 10"  (1.778 m)   Wt 130.2 kg (287 lb)   SpO2 100%   BMI 41.18 kg/m   Physical Exam  Constitutional: He appears well-developed and well-nourished. No distress.  HENT:  Head: Normocephalic and atraumatic.  Poor dentition throughout mouth with multiple dental caries and decay.  Dental tenderness noted to right upper teeth.  Some gingival erythema noted.  No fluctuant abscess noted.  Uvula midline, no trismus, tolerating secretions.  Eyes: Conjunctivae are normal.  Neck: Normal range of motion. Neck supple.  Cardiovascular: Regular rhythm, normal heart sounds and intact distal pulses.  Tachycardia  Pulmonary/Chest: Effort normal. No stridor. No respiratory distress. He has wheezes (Mild wheezes bilaterally). He has no rales. He exhibits no tenderness.  Abdominal: Soft. Bowel sounds are normal. There is no tenderness. There is no guarding.  Musculoskeletal:  Mild bilateral lower extremity edema, no unilateral tenderness or erythema.  Neurological: He is alert.  Skin: Skin is warm. He is not diaphoretic.  Psychiatric: He has a normal mood and affect. His behavior is normal.  Nursing note and vitals reviewed.    ED Treatments / Results  Labs (all labs ordered are listed, but only abnormal results are displayed) Labs Reviewed  BASIC METABOLIC PANEL - Abnormal; Notable for the following components:       Result Value   Sodium 132 (*)    Potassium 2.9 (*)    Chloride 99 (*)    CO2 21 (*)    Glucose, Bld 140 (*)    Creatinine, Ser 1.33 (*)    Calcium 8.2 (*)    GFR calc non Af Amer 60 (*)    All other components within normal limits  CBC - Abnormal; Notable for the following components:   WBC 11.9 (*)    RBC 3.59 (*)    Hemoglobin 12.5 (*)    HCT 36.8 (*)    MCV 102.5 (*)    MCH 34.8 (*)    All other components within normal limits  URINALYSIS, ROUTINE W REFLEX MICROSCOPIC - Abnormal; Notable for the following components:   APPearance HAZY (*)    Glucose, UA 50 (*)    Ketones, ur 5 (*)    Protein, ur 30 (*)    Squamous Epithelial / LPF 6-30 (*)  All other components within normal limits  MAGNESIUM - Abnormal; Notable for the following components:   Magnesium 1.3 (*)    All other components within normal limits  D-DIMER, QUANTITATIVE (NOT AT [facility])  CBG MONITORING, ED  I-STAT TROPONIN, ED  I-STAT TROPONIN, ED    EKG  EKG Interpretation  Date/Time:  Saturday May 28 2017 18:29:28 EST Ventricular Rate:  112 PR Interval:    QRS Duration: 137 QT Interval:  367 QTC Calculation: 501 R Axis:   -78 Text Interpretation:  Sinus tachycardia Multiple premature complexes, vent & supraven Right bundle branch block LVH with IVCD and secondary repol abnrm Prolonged QT interval occasional PVC and pronged QT new from previous Confirmed by Theotis Burrow [phone]) on 05/28/2017 7:42:17 PM       Radiology Dg Chest 2 View  Result Date: 05/28/2017 CLINICAL DATA:  Patient with atrial fibrillation. EXAM: CHEST  2 VIEW COMPARISON:  Chest radiograph 02/08/2014. FINDINGS: Patient is rotated to the right. Stable cardiac and mediastinal contours. Bibasilar atelectasis. No pleural effusion or pneumothorax. Thoracic spine degenerative changes. IMPRESSION: No acute cardiopulmonary process. Electronically Signed   By: Lovey Newcomer M.D.   On: 05/28/2017 20:25    Procedures Procedures (including  critical care time)  Medications Ordered in ED Medications  potassium chloride 10 mEq in 100 mL IVPB (10 mEq Intravenous New Bag/Given 05/29/17 0043)  potassium chloride SA (K-DUR,KLOR-CON) CR tablet 40 mEq (40 mEq Oral Given 05/28/17 2014)  sodium chloride 0.9 % bolus 1,000 mL (0 mLs Intravenous Stopped 05/28/17 2223)  HYDROcodone-acetaminophen (NORCO/VICODIN) 5-325 MG per tablet 1 tablet (1 tablet Oral Given 05/28/17 2107)  penicillin v potassium (VEETID) tablet 500 mg (500 mg Oral Given 05/28/17 2319)  sodium chloride 0.9 % bolus 1,000 mL (1,000 mLs Intravenous New Bag/Given 05/29/17 0043)     Initial Impression / Assessment and Plan / ED Course  I have reviewed the triage vital signs and the nursing notes.  Pertinent labs & imaging results that were available during my care of the patient were reviewed by me and considered in my medical decision making (see chart for details).  Clinical Course as of May 29 105  Sun May 29, 2017  0018 Pt continues to be tachycardic. Will give another liter of fluids, check d-dimer, magnesium. After IV potassium, plan to recheck EKG.   [JR]    Clinical Course User Index [JR] Adiba Fargnoli, [place] N, PA-C    Pt presenting with near-syncopal episode consisting of lightheadedness and diaphoresis, found to be in sinus tachycardiaa with PACs. Incidence of afib in the past, however pt does not appear to be in afib today or per EMS EKG tracing (contrary to triage note). Pt without CP, SOB, cough, unilateral LE edema/erythema. Cardiac and pulmonary exam benign. Labs revealing hypokalemia at 2.9, mild AKI 1.33, WBC slightly elev at 11.9. Troponin neg x2.  EKG with sinus tachycardia and prolonged QT. Pt given IVF and potassium. Pt also with 2nd complaint of dental pain; no fluctuant abscess noted on exam, however poor dentition throughout mouth; dose of PCN given in ED with plan for PCN rx. Pt remains asymptomatic in ED, however persistently tachycardic. D-dimer ordered to rule  out PE. Magnesium pending. Plan to re-check EKG after IV potassium. Care assumed by Janetta Hora, PA-C, pending re-evaluation, d-dimer, repeat EKG.   Patient discussed with Dr. Rex Kras.  Final Clinical Impressions(s) / ED Diagnoses   Final diagnoses:  Near syncope    ED Discharge Orders    None  Shenea Giacobbe, [place] N, PA-C 05/29/17 0110    Little, Wenda Overland, MD 05/30/17 2114

## 2017-05-28 NOTE — ED Notes (Signed)
Patient transported to X-ray 

## 2017-05-28 NOTE — ED Triage Notes (Addendum)
Pt BIB EMS from work for near syncope and afib. Pt reports standing at work when he started feeling dizzy, and had to sit down so that he would not "pass out." Pt reports an occurrence of afib a year ago but did not follow up to be placed on medication. Pt in afib and RBBB PTA. Resp e/u; A&Ox4.   Pt reports left sided mouth abscess, and states he has not really eaten the past 2 days. CBG 108 PTA.

## 2017-05-29 ENCOUNTER — Emergency Department (HOSPITAL_COMMUNITY): Payer: Self-pay

## 2017-05-29 DIAGNOSIS — R55 Syncope and collapse: Secondary | ICD-10-CM

## 2017-05-29 DIAGNOSIS — E876 Hypokalemia: Secondary | ICD-10-CM

## 2017-05-29 DIAGNOSIS — N179 Acute kidney failure, unspecified: Secondary | ICD-10-CM

## 2017-05-29 DIAGNOSIS — I160 Hypertensive urgency: Secondary | ICD-10-CM

## 2017-05-29 DIAGNOSIS — M272 Inflammatory conditions of jaws: Secondary | ICD-10-CM

## 2017-05-29 DIAGNOSIS — A419 Sepsis, unspecified organism: Principal | ICD-10-CM

## 2017-05-29 HISTORY — DX: Hypomagnesemia: E83.42

## 2017-05-29 HISTORY — DX: Sepsis, unspecified organism: A41.9

## 2017-05-29 HISTORY — DX: Syncope and collapse: R55

## 2017-05-29 HISTORY — DX: Inflammatory conditions of jaws: M27.2

## 2017-05-29 HISTORY — DX: Hypertensive urgency: I16.0

## 2017-05-29 LAB — LACTIC ACID, PLASMA
Lactic Acid, Venous: 1.1 mmol/L (ref 0.5–1.9)
Lactic Acid, Venous: 1.5 mmol/L (ref 0.5–1.9)

## 2017-05-29 LAB — BASIC METABOLIC PANEL
Anion gap: 11 (ref 5–15)
BUN: 8 mg/dL (ref 6–20)
CO2: 19 mmol/L — ABNORMAL LOW (ref 22–32)
Calcium: 7.9 mg/dL — ABNORMAL LOW (ref 8.9–10.3)
Chloride: 102 mmol/L (ref 101–111)
Creatinine, Ser: 1.1 mg/dL (ref 0.61–1.24)
GFR calc Af Amer: >60 mL/min (ref >60)
GFR calc non Af Amer: >60 mL/min (ref >60)
Glucose, Bld: 137 mg/dL — ABNORMAL HIGH (ref 65–99)
Potassium: 3.8 mmol/L (ref 3.5–5.1)
Sodium: 132 mmol/L — ABNORMAL LOW (ref 135–145)

## 2017-05-29 LAB — GLUCOSE, CAPILLARY
Glucose-Capillary: 165 mg/dL — ABNORMAL HIGH (ref 65–99)
Glucose-Capillary: 113 mg/dL — ABNORMAL HIGH (ref 65–99)
Glucose-Capillary: 133 mg/dL — ABNORMAL HIGH (ref 65–99)

## 2017-05-29 LAB — MRSA PCR SCREENING: MRSA by PCR: NEGATIVE

## 2017-05-29 LAB — D-DIMER, QUANTITATIVE: D-Dimer, Quant: <0.27 ug/mL-FEU (ref 0.00–0.50)

## 2017-05-29 LAB — MAGNESIUM
Magnesium: 1.3 mg/dL — ABNORMAL LOW (ref 1.7–2.4)
Magnesium: 1.5 mg/dL — ABNORMAL LOW (ref 1.7–2.4)

## 2017-05-29 LAB — HEMOGLOBIN A1C
Hgb A1c MFr Bld: 5.8 % — ABNORMAL HIGH (ref 4.8–5.6)
Mean Plasma Glucose: 119.76 mg/dL

## 2017-05-29 LAB — CBG MONITORING, ED: Glucose-Capillary: 129 mg/dL — ABNORMAL HIGH (ref 65–99)

## 2017-05-29 LAB — TSH: TSH: 2.348 u[IU]/mL (ref 0.350–4.500)

## 2017-05-29 LAB — TROPONIN I
Troponin I: 0.05 ng/mL (ref <0.03)
Troponin I: 0.05 ng/mL (ref <0.03)
Troponin I: 0.04 ng/mL (ref <0.03)

## 2017-05-29 LAB — HIV ANTIBODY (ROUTINE TESTING W REFLEX): HIV Screen 4th Generation wRfx: NONREACTIVE

## 2017-05-29 LAB — VITAMIN B12: Vitamin B-12: 314 pg/mL (ref 180–914)

## 2017-05-29 MED ORDER — ONDANSETRON HCL 4 MG PO TABS: 4.0000 mg | ORAL_TABLET | Freq: Four times a day (QID) | ORAL | Status: DC | PRN

## 2017-05-29 MED ORDER — MAGNESIUM OXIDE 400 (241.3 MG) MG PO TABS
800.0000 mg | ORAL_TABLET | Freq: Two times a day (BID) | ORAL | Status: DC
  Administered 2017-05-29 – 2017-05-31 (×4): 800 mg via ORAL
  Filled 2017-05-29 (×5): qty 2

## 2017-05-29 MED ORDER — SODIUM CHLORIDE 0.9 % IV SOLN
1.0000 g | Freq: Once | INTRAVENOUS | Status: AC
  Administered 2017-05-29: 1 g via INTRAVENOUS
  Filled 2017-05-29: qty 10

## 2017-05-29 MED ORDER — IOPAMIDOL (ISOVUE-300) INJECTION 61%
INTRAVENOUS | Status: AC
  Administered 2017-05-29: 75 mL
  Filled 2017-05-29: qty 75

## 2017-05-29 MED ORDER — SODIUM CHLORIDE 0.9 % IV BOLUS (SEPSIS)
1000.0000 mL | Freq: Once | INTRAVENOUS | Status: AC
  Administered 2017-05-29: 1000 mL via INTRAVENOUS

## 2017-05-29 MED ORDER — SODIUM CHLORIDE 0.9 % IV SOLN
INTRAVENOUS | Status: DC
  Administered 2017-05-29 – 2017-05-30 (×2): via INTRAVENOUS

## 2017-05-29 MED ORDER — OXYCODONE-ACETAMINOPHEN 5-325 MG PO TABS
1.0000 | ORAL_TABLET | ORAL | Status: DC | PRN
  Administered 2017-05-29 – 2017-05-31 (×7): 1 via ORAL
  Filled 2017-05-29 (×7): qty 1

## 2017-05-29 MED ORDER — INSULIN ASPART 100 UNIT/ML ~~LOC~~ SOLN: 0.0000 [IU] | Freq: Every day | SUBCUTANEOUS | Status: DC

## 2017-05-29 MED ORDER — ACETAMINOPHEN 325 MG PO TABS
650.0000 mg | ORAL_TABLET | Freq: Four times a day (QID) | ORAL | Status: DC | PRN
  Administered 2017-05-29: 650 mg via ORAL
  Filled 2017-05-29 (×2): qty 2

## 2017-05-29 MED ORDER — INFLUENZA VAC SPLIT QUAD 0.5 ML IM SUSY: 0.5000 mL | PREFILLED_SYRINGE | INTRAMUSCULAR | Status: DC

## 2017-05-29 MED ORDER — METOPROLOL TARTRATE 25 MG PO TABS
25.0000 mg | ORAL_TABLET | Freq: Two times a day (BID) | ORAL | Status: DC
  Administered 2017-05-29 – 2017-05-31 (×5): 25 mg via ORAL
  Filled 2017-05-29 (×5): qty 1

## 2017-05-29 MED ORDER — INSULIN ASPART 100 UNIT/ML ~~LOC~~ SOLN
0.0000 [IU] | Freq: Three times a day (TID) | SUBCUTANEOUS | Status: DC
  Administered 2017-05-29 (×2): 3 [IU] via SUBCUTANEOUS
  Filled 2017-05-29: qty 1

## 2017-05-29 MED ORDER — AMLODIPINE BESYLATE 5 MG PO TABS: 5.0000 mg | ORAL_TABLET | Freq: Every day | ORAL | Status: DC

## 2017-05-29 MED ORDER — ACETAMINOPHEN 650 MG RE SUPP: 650.0000 mg | Freq: Four times a day (QID) | RECTAL | Status: DC | PRN

## 2017-05-29 MED ORDER — CLINDAMYCIN PHOSPHATE 600 MG/50ML IV SOLN
600.0000 mg | Freq: Once | INTRAVENOUS | Status: AC
Start: 2017-05-29 — End: 2017-05-29
  Administered 2017-05-29: 600 mg via INTRAVENOUS
  Filled 2017-05-29: qty 50

## 2017-05-29 MED ORDER — LABETALOL HCL 5 MG/ML IV SOLN
10.0000 mg | INTRAVENOUS | Status: AC
Start: 2017-05-29 — End: 2017-05-29
  Administered 2017-05-29: 10 mg via INTRAVENOUS
  Filled 2017-05-29: qty 4

## 2017-05-29 MED ORDER — SODIUM CHLORIDE 0.9% FLUSH
3.0000 mL | Freq: Two times a day (BID) | INTRAVENOUS | Status: DC
  Administered 2017-05-29: 3 mL via INTRAVENOUS

## 2017-05-29 MED ORDER — POTASSIUM CHLORIDE 10 MEQ/100ML IV SOLN
10.0000 meq | Freq: Once | INTRAVENOUS | Status: AC
  Administered 2017-05-29: 10 meq via INTRAVENOUS
  Filled 2017-05-29: qty 100

## 2017-05-29 MED ORDER — CHLORHEXIDINE GLUCONATE 0.12 % MT SOLN
15.0000 mL | Freq: Two times a day (BID) | OROMUCOSAL | Status: DC
  Administered 2017-05-29 – 2017-05-31 (×4): 15 mL via OROMUCOSAL
  Filled 2017-05-29 (×4): qty 15

## 2017-05-29 MED ORDER — LABETALOL HCL 5 MG/ML IV SOLN
10.0000 mg | INTRAVENOUS | Status: DC | PRN
  Administered 2017-05-29 – 2017-05-31 (×2): 10 mg via INTRAVENOUS
  Filled 2017-05-29 (×4): qty 4

## 2017-05-29 MED ORDER — IPRATROPIUM-ALBUTEROL 0.5-2.5 (3) MG/3ML IN SOLN: 3.0000 mL | RESPIRATORY_TRACT | Status: DC | PRN

## 2017-05-29 MED ORDER — HYDROMORPHONE HCL 1 MG/ML IJ SOLN
0.5000 mg | Freq: Once | INTRAMUSCULAR | Status: AC
  Administered 2017-05-29: 0.5 mg via INTRAVENOUS
  Filled 2017-05-29: qty 1

## 2017-05-29 MED ORDER — ENOXAPARIN SODIUM 40 MG/0.4ML ~~LOC~~ SOLN
40.0000 mg | SUBCUTANEOUS | Status: DC
  Administered 2017-05-29 – 2017-05-30 (×2): 40 mg via SUBCUTANEOUS
  Filled 2017-05-29 (×4): qty 0.4

## 2017-05-29 MED ORDER — CLINDAMYCIN PHOSPHATE 600 MG/50ML IV SOLN
600.0000 mg | Freq: Three times a day (TID) | INTRAVENOUS | Status: DC
  Administered 2017-05-29 – 2017-05-30 (×5): 600 mg via INTRAVENOUS
  Filled 2017-05-29 (×6): qty 50

## 2017-05-29 MED ORDER — ONDANSETRON HCL 4 MG/2ML IJ SOLN: 4.0000 mg | Freq: Four times a day (QID) | INTRAMUSCULAR | Status: DC | PRN

## 2017-05-29 MED ORDER — MAGNESIUM SULFATE 2 GM/50ML IV SOLN
2.0000 g | Freq: Once | INTRAVENOUS | Status: AC
  Administered 2017-05-29: 2 g via INTRAVENOUS
  Filled 2017-05-29: qty 50

## 2017-05-29 NOTE — ED Notes (Signed)
ED PA at bedside

## 2017-05-29 NOTE — ED Notes (Signed)
HW labels sent to main lab for d-dimer and mag blood test.

## 2017-05-29 NOTE — ED Notes (Signed)
Pt CBG, 129. Nurse was notified.

## 2017-05-29 NOTE — ED Provider Notes (Signed)
Patient was signed out to me by Donnelly Angelica PA-C. He is a 52 year old who presents with near syncopal episode. He is persistently tachycardic in the ED despite 2L IVF. He is also hypertensive. CBC is remarkable for leukocytosis of 11.9 and mild anemia with high MCV. BMP is remarkable for hyponatremia (132), hypokalemia (2.9), low CO2 (21), mild elevation of SCr (1.33 compared to 0.96 2 years ago). Mag is 1.3. Serial troponins are normal. Potassium and Mg were replaced. Plan is to re-eval after d-dimer and recheck EKG which had a prolonged QT.  1:00AM D-dimer is negative. CT maxillofacial was ordered to r/o dental abscess. Will give Clindamycin IV and medicine for pain.  4:44 AM CT shows dental infection without evidence of abscess. Patient feels unimproved. He is still tachycardic and very thirsty. Will admit for further management. Spoke with Dr. Tamala Julian with [facility] who will admit.    Recardo Evangelist, PA-C 38/70/65 [id number]    Delora Fuel, MD 88/83/58 [phone]

## 2017-05-29 NOTE — Progress Notes (Signed)
Agree with HPI per Dr. Tamala Julian  52 y/o male, Body mass index is 41.18 kg/m., htn,hld, dmt ty II, reflux L sided CP 10/2016 [facility]-TTE EF 55-60%-R heart cath-non obs. Admit from ED 1/6 with near syncopy as well as poor po intake for the last week in a setting of pain in R upper Jaw from tooth problems  Has had an episode like this before--never been told about irreg HR however Not taking any meds  No CP currently  O/e  Awake al;ert oriented Sounds like NSR although tachy abd soft--cannot clearly examine teeth   P For sepsis cont IVF 100 cc/h for now cont CLindamycin--will need OP Dental input-soft diet ordered ?Afib and Htn urgency-I see more sinus tachy--d/c amlodipine-start metoprolol 25 bid and check monitors again-await Echo DM ty ii not on m,eds-cont SSI-get A1c-might need insulin teaching Lytes-check mag and calcium with next draw-K 2.9 so await rpot, nursing to infomr when lavbs back   Verneita Griffes, MD [facility] [phone]

## 2017-05-29 NOTE — ED Notes (Signed)
Pt is in so much pain and cant really hold still for vitals, pt states he does not feel dizzy or light headed

## 2017-05-29 NOTE — H&P (Addendum)
History and Physical    Allen Gould WGN:[id number] DOB: [birth year] DOA: 05/28/2017  Referring MD/NP/PA: Kari Baars PCP: Patient, No Pcp Per  Patient coming from: Work via EMS  Chief Complaint: Near syncope  I have personally briefly reviewed patient's old medical records in [place]   HPI: Allen Gould is a 52 y.o. male with medical history significant of HTN, DM type II, asthma, A. fib not on anticoagulation, and GERD; who presented after having near syncopal event while at work.  Patient reports that when he stood up at work it felt like he was going to pass out and therefore sat down.  Denies ever losing consciousness.  Reports that he is not been able to eat or drink much due to right upper tooth, jaw pain, and swelling.  Patient reports that he is not on any medications at home except for aspirin which she has been taking as needed for pain without much relief.  Associated symptoms include intermittent fever, chills, and urinary frequency.  ED Course: En route with EMS patient was noted to be in A. fib with RBBB prior to arrival.  Upon arrival patient was noted to be afebrile, heart rate is 107-129, respirations 13-21, blood pressure 124/107-198/130, and O2 saturations maintained on room air.  Labs revealed WBC 11.9, hemoglobin 12.5, BUN 132, potassium 2.9, BUN 7, creatinine 1.33, calcium 8.2, magnesium 1.3, d-dimer <.27, and troponin 0.03.  Urinalysis was negative for signs of infection.  Maxillofacial CT showed a right sided dental infection with no signs of abscess. Patient had initially been given IV fluids, pain medication, and penicillin V, then clindamycin was added on.  TRH called to admit.  Review of Systems  Constitutional: Positive for chills and fever. Negative for malaise/fatigue.  HENT: Negative for ear pain and nosebleeds.        Positive for jaw pain  Eyes: Negative for photophobia and discharge.  Respiratory: Negative for shortness of breath.   Cardiovascular:  Positive for palpitations. Negative for leg swelling.  Gastrointestinal: Negative for abdominal pain, nausea and vomiting.  Genitourinary: Positive for frequency. Negative for dysuria.  Musculoskeletal: Negative for falls.  Skin: Negative for itching and rash.  Neurological: Positive for dizziness and weakness. Negative for loss of consciousness.       Positive for near syncope  Psychiatric/Behavioral: Negative for memory loss.    Past Medical History:  Diagnosis Date  . Asthma   . Diabetes mellitus without complication ([place])   . GERD (gastroesophageal reflux disease)   . Hypertension     Past Surgical History:  Procedure Laterality Date  . APPENDECTOMY       reports that he has quit smoking. His smoking use included cigarettes. he has never used smokeless tobacco. He reports that he drinks about 1.2 oz of alcohol per week. He reports that he does not use drugs.  Allergies  Allergen Reactions  . Shellfish Allergy Hives    Family History  Problem Relation Age of Onset  . Kidney disease Mother   . Diabetes Sister   . Hyperlipidemia Sister   . Heart disease Other     Prior to Admission medications   Medication Sig Start Date End Date Taking? Authorizing Provider  aspirin 325 MG tablet Take 325 mg by mouth daily as needed for moderate pain.   Yes [provider]  ibuprofen (ADVIL,MOTRIN) 200 MG tablet Take 400 mg by mouth every 6 (six) hours as needed.   Yes [provider]  amLODipine (NORVASC) 5 MG  tablet Take 1 tablet (5 mg total) by mouth daily. Patient not taking: Reported on 05/28/2017 08/26/14 08/26/15  Francesca Oman, DO  atorvastatin (LIPITOR) 40 MG tablet Take 1 tablet (40 mg total) by mouth daily at 6 PM. Patient not taking: Reported on 08/09/2014 03/04/14   McLean-Scocuzza, Nino Glow, MD  insulin NPH-regular Human (HUMULIN 70/30) (70-30) 100 UNIT/ML injection Inject 25 Units into the skin 2 (two) times daily with a meal. 08/26/14   Francesca Oman, DO    lisinopril (PRINIVIL,ZESTRIL) 40 MG tablet Take 1 tablet (40 mg total) by mouth daily. Patient not taking: Reported on 05/28/2017 08/26/14   Francesca Oman, DO  metFORMIN (GLUCOPHAGE) 1000 MG tablet Take 1 tablet (1,000 mg total) by mouth 2 (two) times daily with a meal. Patient not taking: Reported on 05/28/2017 08/26/14   Francesca Oman, DO  traMADol (ULTRAM) 50 MG tablet Take 2 tablets (100 mg total) by mouth 2 (two) times daily as needed. Patient not taking: Reported on 05/28/2017 08/08/14   McLean-Scocuzza, Nino Glow, MD    Physical Exam:  Constitutional: Obese male who appears to be in some discomfort. Vitals:   05/28/17 2315 05/29/17 0000 05/29/17 0100 05/29/17 0408  BP: (!) 189/132 (!) 163/147 (!) 150/131 (!) 174/130  Pulse: (!) 129 (!) 121 (!) 114 (!) 123  Resp: (!) 21  (!) 21 15  Temp:      TempSrc:      SpO2: 99% 100% 100% 100%  Weight:      Height:       Eyes: PERRL, lids and conjunctivae normal ENMT: Poor oral dentition with multiple dental caries.  Marked swelling of the upper right hand gumline.  Tenderness to palpation of the upper jaw. Neck: normal, positive submandibular lymphadenopathy. Respiratory: clear to auscultation bilaterally, no wheezing, no crackles. Normal respiratory effort. No accessory muscle use.  Cardiovascular: Tachycardic, no murmurs / rubs / gallops. No extremity edema. 2+ pedal pulses. No carotid bruits.  Abdomen: no tenderness, no masses palpated. No hepatosplenomegaly. Bowel sounds positive.  Musculoskeletal: no clubbing / cyanosis. No joint deformity upper and lower extremities. Good ROM, no contractures. Normal muscle tone.  Skin: no rashes, lesions, ulcers. No induration Neurologic: CN 2-12 grossly intact. Sensation intact, DTR normal. Strength 5/5 in all 4.  Psychiatric: Normal judgment and insight. Alert and oriented x 3. Normal mood.     Labs on Admission: I have personally reviewed following labs and imaging studies  CBC: Recent Labs  Lab  05/28/17 1832  WBC 11.9*  HGB 12.5*  HCT 36.8*  MCV 102.5*  PLT 384   Basic Metabolic Panel: Recent Labs  Lab 05/28/17 1832 05/29/17 0012  NA 132*  --   K 2.9*  --   CL 99*  --   CO2 21*  --   GLUCOSE 140*  --   BUN 7  --   CREATININE 1.33*  --   CALCIUM 8.2*  --   MG  --  1.3*   GFR: Estimated Creatinine Clearance: 87.1 mL/min (A) (by C-G formula based on SCr of 1.33 mg/dL (H)). Liver Function Tests: No results for input(s): AST, ALT, ALKPHOS, BILITOT, PROT, ALBUMIN in the last 168 hours. No results for input(s): LIPASE, AMYLASE in the last 168 hours. No results for input(s): AMMONIA in the last 168 hours. Coagulation Profile: No results for input(s): INR, PROTIME in the last 168 hours. Cardiac Enzymes: No results for input(s): CKTOTAL, CKMB, CKMBINDEX, TROPONINI in the last 168 hours. BNP (last  3 results) No results for input(s): PROBNP in the last 8760 hours. HbA1C: No results for input(s): HGBA1C in the last 72 hours. CBG: No results for input(s): GLUCAP in the last 168 hours. Lipid Profile: No results for input(s): CHOL, HDL, LDLCALC, TRIG, CHOLHDL, LDLDIRECT in the last 72 hours. Thyroid Function Tests: No results for input(s): TSH, T4TOTAL, FREET4, T3FREE, THYROIDAB in the last 72 hours. Anemia Panel: No results for input(s): VITAMINB12, FOLATE, FERRITIN, TIBC, IRON, RETICCTPCT in the last 72 hours. Urine analysis:    Component Value Date/Time   COLORURINE YELLOW 05/28/2017 2010   APPEARANCEUR HAZY (A) 05/28/2017 2010   LABSPEC 1.017 05/28/2017 2010   PHURINE 5.0 05/28/2017 2010   GLUCOSEU 50 (A) 05/28/2017 2010   HGBUR NEGATIVE 05/28/2017 2010   BILIRUBINUR NEGATIVE 05/28/2017 2010   KETONESUR 5 (A) 05/28/2017 2010   PROTEINUR 30 (A) 05/28/2017 2010   UROBILINOGEN 0.2 08/10/2013 1240   NITRITE NEGATIVE 05/28/2017 2010   LEUKOCYTESUR NEGATIVE 05/28/2017 2010   Sepsis Labs: No results found for this or any previous visit (from the past 240 hour(s)).    Radiological Exams on Admission: Dg Chest 2 View  Result Date: 05/28/2017 CLINICAL DATA:  Patient with atrial fibrillation. EXAM: CHEST  2 VIEW COMPARISON:  Chest radiograph 02/08/2014. FINDINGS: Patient is rotated to the right. Stable cardiac and mediastinal contours. Bibasilar atelectasis. No pleural effusion or pneumothorax. Thoracic spine degenerative changes. IMPRESSION: No acute cardiopulmonary process. Electronically Signed   By: Lovey Newcomer M.D.   On: 05/28/2017 20:25   Ct Maxillofacial W Contrast  Result Date: 05/29/2017 CLINICAL DATA:  52 y/o  M; right upper dental pain. EXAM: CT MAXILLOFACIAL WITH CONTRAST TECHNIQUE: Multidetector CT imaging of the maxillofacial structures was performed with intravenous contrast. Multiplanar CT image reconstructions were also generated. CONTRAST:  48m ISOVUE-300 IOPAMIDOL (ISOVUE-300) INJECTION 61% COMPARISON:  None. FINDINGS: Osseous: Dental disease with multiple caries and periapical cysts. Outer cortical defect in right maxillary alveolar bone associated with the right first premolar (series 3, image 48). There is edema within right pre maxillary fat and thickening of the right levator labii superioris. No abscess. Orbits: Negative. No traumatic or inflammatory finding. Sinuses: Left maxillary sinus mild mucosal thickening. Otherwise negative. Soft tissues: As above. Limited intracranial: No significant or unexpected finding. IMPRESSION: Right maxillary first premolar dental disease with cortical defect in maxillary alveolar bone. Edema within overlying right pre maxillary fat and thickening of the right levator labii superioris likely related to dental infection. No abscess. Electronically Signed   By: LKristine GarbeM.D.   On: 05/29/2017 02:40    EKG: Independently reviewed.  Sinus tachycardia with a PVCs with prolonged QTC of 501  Assessment/Plan Sepsis 2/2 odontogenic infection: Patient presents tachycardic, tachypneic with elevated WBC of  11.9 on admission.  Lactic acid was not initially obtained or blood cultures.  Patient received IV fluids, and antibiotics of Clindamycin. - Admit to a stepdown - Sepsis protocol had not been initiated - Follow-up blood cultures -Check lactic acid level - Continue empiric antibiotics of clindamycin  Near syncope: Patient presents feeling lightheaded as though he may pass out.  Found to be in sinus tachycardia and significantly hypertensive.  Pulmonary embolus less likely d-dimer noted to be negative.  Arrhythmia:  Patient notes history of atrial fibrillation in the past, but is not on any oral anticoagulants.  Initial EKG showing what appears to be likely sinus tachycardia with intermittent PVCs.  - Recheck EKG - Check TSH and troponin - Check echocardiogram -  Correct electrolytes    Hypertensive urgency: Blood pressures noted to be as high as 198/130 on admission.  Patient notes not being on any medications and does not have primary care provider currently.  - Restart amlodipine - Labetalol IV prn  Acute kidney injury: Baseline creatinine previously noted to be within normal limits, but patient presents with a creatinine of 1.33.  Patient notes poor p.o. intake as a possible cause.  Also on differential includes progression of kidney disease due to untreated diabetes. - IV fluids as tolerated - May warrant further workup  Diabetes mellitus type 2: Uncontrolled patient not on any medications currently. - Hypoglycemic protocols - Moderate SSI with meals - Will need to recheck hemoglobin A1c  Hypokalemia: Acute initial potassium 2.9 on admission.  Patient given 40 mEq of potassium chloride orally and 10 mEq IV.  - Continue to monitor and replace as needed  Hypomagnesia: Initial magnesium 1.3 on admission.  Patient given 2 g of magnesium sulfate in the ED. - Continue to monitor and replace as needed   DVT prophylaxis: Lovenox Code Status: Full Family Communication: none    Disposition Plan: TBD  Consults called: none  Admission status: inpatient   Norval Morton MD [facility] Pager [phone]   If 7PM-7AM, please contact night-coverage [email or web address] Password [facility]  05/29/2017, 4:47 AM

## 2017-05-30 ENCOUNTER — Inpatient Hospital Stay (HOSPITAL_COMMUNITY): Payer: Self-pay

## 2017-05-30 DIAGNOSIS — I340 Nonrheumatic mitral (valve) insufficiency: Secondary | ICD-10-CM

## 2017-05-30 LAB — COMPREHENSIVE METABOLIC PANEL
ALT: 25 U/L (ref 17–63)
AST: 28 U/L (ref 15–41)
Albumin: 2.5 g/dL — ABNORMAL LOW (ref 3.5–5.0)
Alkaline Phosphatase: 130 U/L — ABNORMAL HIGH (ref 38–126)
Anion gap: 9 (ref 5–15)
BUN: 8 mg/dL (ref 6–20)
CO2: 24 mmol/L (ref 22–32)
Calcium: 7.9 mg/dL — ABNORMAL LOW (ref 8.9–10.3)
Chloride: 103 mmol/L (ref 101–111)
Creatinine, Ser: 0.99 mg/dL (ref 0.61–1.24)
GFR calc Af Amer: >60 mL/min (ref >60)
GFR calc non Af Amer: >60 mL/min (ref >60)
Glucose, Bld: 112 mg/dL — ABNORMAL HIGH (ref 65–99)
Potassium: 3.7 mmol/L (ref 3.5–5.1)
Sodium: 136 mmol/L (ref 135–145)
Total Bilirubin: 1 mg/dL (ref 0.3–1.2)
Total Protein: 5.9 g/dL — ABNORMAL LOW (ref 6.5–8.1)

## 2017-05-30 LAB — ECHOCARDIOGRAM COMPLETE
AO mean calculated velocity dopler: 118 cm/s
AV Area VTI index: 0.85 cm2/m2
AV Area VTI: 2.07 cm2
AV Area mean vel: 2.29 cm2
AV Mean grad: 6 mmHg
AV Peak grad: 14 mmHg
AV VEL mean LVOT/AV: 0.81
AV area mean vel ind: 0.89 cm2/m2
AV peak Index: 0.8
AV pk vel: 188 cm/s
AV vel: 2.19
Ao pk vel: 0.73 m/s
Area-P 1/2: 3.73 cm2
E decel time: 201 msec
E/e' ratio: 10.23
FS: 28 % (ref 28–44)
Height: 70 in
IVS/LV PW RATIO, ED: 1
LA ID, A-P, ES: 30 mm
LA diam end sys: 30 mm
LA diam index: 1.17 cm/m2
LA vol A4C: 63.7 ml
LA vol index: 31.4 mL/m2
LA vol: 80.7 mL
LV E/e' medial: 10.23
LV E/e'average: 10.23
LV PW d: 14 mm — AB (ref 0.6–1.1)
LV e' LATERAL: 9.73 cm/s
LVOT SV: 70 mL
LVOT VTI: 24.7 cm
LVOT area: 2.84 cm2
LVOT diameter: 19 mm
LVOT peak VTI: 0.77 cm
LVOT peak grad rest: 8 mmHg
LVOT peak vel: 137 cm/s
Lateral S' vel: 14.9 cm/s
MV Dec: 201
MV Peak grad: 4 mmHg
MV pk A vel: 119 m/s
MV pk E vel: 99.5 m/s
P 1/2 time: 59 ms
TAPSE: 22.5 mm
TDI e' lateral: 9.73
TDI e' medial: 6.89
VTI: 32 cm
Valve area index: 0.85
Valve area: 2.19 cm2
Weight: 4518.55 oz

## 2017-05-30 LAB — GLUCOSE, CAPILLARY
Glucose-Capillary: 113 mg/dL — ABNORMAL HIGH (ref 65–99)
Glucose-Capillary: 86 mg/dL (ref 65–99)
Glucose-Capillary: 104 mg/dL — ABNORMAL HIGH (ref 65–99)
Glucose-Capillary: 115 mg/dL — ABNORMAL HIGH (ref 65–99)
Glucose-Capillary: 102 mg/dL — ABNORMAL HIGH (ref 65–99)

## 2017-05-30 MED ORDER — ASPIRIN 325 MG PO TABS: 325.0000 mg | ORAL_TABLET | Freq: Every day | ORAL | Status: DC | PRN

## 2017-05-30 MED ORDER — LISINOPRIL 20 MG PO TABS
20.0000 mg | ORAL_TABLET | Freq: Every day | ORAL | Status: DC
  Administered 2017-05-30 – 2017-05-31 (×2): 20 mg via ORAL
  Filled 2017-05-30 (×2): qty 1

## 2017-05-30 MED ORDER — PERFLUTREN LIPID MICROSPHERE
1.0000 mL | INTRAVENOUS | Status: AC | PRN
  Administered 2017-05-30: 2 mL via INTRAVENOUS
  Filled 2017-05-30: qty 10

## 2017-05-30 MED ORDER — INSULIN ASPART PROT & ASPART (70-30 MIX) 100 UNIT/ML ~~LOC~~ SUSP
20.0000 [IU] | Freq: Two times a day (BID) | SUBCUTANEOUS | Status: DC
  Administered 2017-05-31: 20 [IU] via SUBCUTANEOUS
  Filled 2017-05-30 (×2): qty 10

## 2017-05-30 MED ORDER — CLINDAMYCIN HCL 300 MG PO CAPS
300.0000 mg | ORAL_CAPSULE | Freq: Three times a day (TID) | ORAL | Status: DC
  Administered 2017-05-30 – 2017-05-31 (×2): 300 mg via ORAL
  Filled 2017-05-30 (×3): qty 1

## 2017-05-30 MED ORDER — METFORMIN HCL 500 MG PO TABS
1000.0000 mg | ORAL_TABLET | Freq: Two times a day (BID) | ORAL | Status: DC
  Administered 2017-05-30 – 2017-05-31 (×3): 1000 mg via ORAL
  Filled 2017-05-30 (×3): qty 2

## 2017-05-30 MED ORDER — AMLODIPINE BESYLATE 5 MG PO TABS
5.0000 mg | ORAL_TABLET | Freq: Every day | ORAL | Status: DC
  Administered 2017-05-30 – 2017-05-31 (×2): 5 mg via ORAL
  Filled 2017-05-30 (×2): qty 1

## 2017-05-30 NOTE — Progress Notes (Signed)
Hospitalist progress note   Allen Gould  TOI:[id number] DOB: 07-Sep-1965 DOA: 05/28/2017 PCP: Patient, No Pcp Per   Specialists:   Brief Narrative:  52 y/o male, Body mass index is 41.18 kg/m., htn,hld, dmt ty II, reflux L sided CP 10/2016 WFU-TTE EF 55-60%-R heart cath-non obs. Admit from ED 1/6 with near syncopy as well as poor po intake for the last week in a setting of pain in R upper Jaw from tooth problems    Assessment & Plan:   Assessment:  The primary encounter diagnosis was Dehydration. Diagnoses of Near syncope, Dental infection, Hypokalemia, AKI (acute kidney injury) ([place]), and Sepsis ([place]) were also pertinent to this visit.  Sepsis 2/2 dental abcess-narrow to PO clinda-pain control percocet 1 tab 4prn-sepsis physiology resolving-transfer frm sdu  Tachycardia -NO AFIB.  Reviewed on monitor-d/c tele  HTn urgency-very poor control-in addition OSA habitus-will needs testing for the same--adding Lisinopril 20 to Metoprolol 25 bid and amlodipine 5 qd  DM ty ii-home meds 70/30 25 U and metformin-resuming both-d/c SSI but check qid today Non obs disease on cath 10/2016-risk factor modif as OP  Body mass index is 40.52 kg/m.-as above  hld-re-order atorvastatin on d/c  DVT prophylaxis: lovenox  Code Status:   full   Family Communication:    none  Disposition Plan:  trasnfer tele-home am>? Needs dentist   Consultants:   none  Procedures:   none  Antimicrobials:    clinda   Subjective:  Awake aelrt intact no distress swelling in mouth is improved, no sob no fever no chills hasn't taken meds in over 6 mo  Objective: Vitals:   05/29/17 1616 05/29/17 1936 05/29/17 2316 05/30/17 0336  BP: (!) 166/108 (!) 168/109 (!) 178/116 (!) (P) 155/99  Pulse: 86   (P) 89  Resp: 16 16 18  (P) 16  Temp: 99.2 F (37.3 C) 99 F (37.2 C) 99.1 F (37.3 C) (P) 98.6 F (37 C)  TempSrc: Oral Oral Oral Oral  SpO2: 100%   (P) 100%  Weight:      Height:        Intake/Output  Summary (Last 24 hours) at 05/30/2017 0738 Last data filed at 05/30/2017 0400 Gross per 24 hour  Intake 3038.33 ml  Output 700 ml  Net 2338.33 ml   Filed Weights   05/28/17 1833 05/29/17 0900  Weight: 130.2 kg (287 lb) 128.1 kg (282 lb 6.6 oz)    Examination: Obese pleasant no pallor nbo ict, R side of mouth still swollen-can visualize a little better today cta b, no rales no ronchi abd obese nt nd Neuro moving all 4 limbs, reflexes deferred smile symm No LE rash  Data Reviewed: I have personally reviewed following labs and imaging studies  CBC: Recent Labs  Lab 05/28/17 1832  WBC 11.9*  HGB 12.5*  HCT 36.8*  MCV 102.5*  PLT 833   Basic Metabolic Panel: Recent Labs  Lab 05/28/17 1832 05/29/17 0012 05/29/17 0909 05/30/17 0338  NA 132*  --  132* 136  K 2.9*  --  3.8 3.7  CL 99*  --  102 103  CO2 21*  --  19* 24  GLUCOSE 140*  --  137* 112*  BUN 7  --  8 8  CREATININE 1.33*  --  1.10 0.99  CALCIUM 8.2*  --  7.9* 7.9*  MG  --  1.3* 1.5*  --    GFR: Estimated Creatinine Clearance: 116 mL/min (by C-G formula based on SCr of 0.99 mg/dL). Liver  Function Tests: Recent Labs  Lab 05/30/17 0338  AST 28  ALT 25  ALKPHOS 130*  BILITOT 1.0  PROT 5.9*  ALBUMIN 2.5*   No results for input(s): LIPASE, AMYLASE in the last 168 hours. No results for input(s): AMMONIA in the last 168 hours. Coagulation Profile: No results for input(s): INR, PROTIME in the last 168 hours. Cardiac Enzymes: Recent Labs  Lab 05/29/17 0625 05/29/17 1144 05/29/17 1811  TROPONINI 0.05* 0.05* 0.04*   CBG: Recent Labs  Lab 05/29/17 0807 05/29/17 1158 05/29/17 1612 05/29/17 2108  GLUCAP 129* 165* 113* 133*   Urine analysis:    Component Value Date/Time   COLORURINE YELLOW 05/28/2017 2010   APPEARANCEUR HAZY (A) 05/28/2017 2010   LABSPEC 1.017 05/28/2017 2010   PHURINE 5.0 05/28/2017 2010   GLUCOSEU 50 (A) 05/28/2017 2010   HGBUR NEGATIVE 05/28/2017 2010   BILIRUBINUR NEGATIVE  05/28/2017 2010   KETONESUR 5 (A) 05/28/2017 2010   PROTEINUR 30 (A) 05/28/2017 2010   UROBILINOGEN 0.2 08/10/2013 1240   NITRITE NEGATIVE 05/28/2017 2010   LEUKOCYTESUR NEGATIVE 05/28/2017 2010     Radiology Studies: Reviewed images personally in health database    Scheduled Meds: . chlorhexidine  15 mL Mouth/Throat BID  . enoxaparin (LOVENOX) injection  40 mg Subcutaneous Q24H  . Influenza vac split quadrivalent PF  0.5 mL Intramuscular Tomorrow-1000  . insulin aspart  0-15 Units Subcutaneous TID WC  . insulin aspart  0-5 Units Subcutaneous QHS  . magnesium oxide  800 mg Oral BID  . metoprolol tartrate  25 mg Oral BID  . sodium chloride flush  3 mL Intravenous Q12H   Continuous Infusions: . sodium chloride 100 mL/hr at 05/30/17 0309  . clindamycin (CLEOCIN) IV Stopped (05/30/17 0724)     LOS: 1 day    Time spent: [place], MD [facility]   If 7PM-7AM, please contact night-coverage [email or web address] Password [email or web address] 05/30/2017, 7:38 AM

## 2017-05-30 NOTE — Clinical Social Work Note (Signed)
[name] acknowledges consult, "This pt has no PCP and will also need guidance with securing dental care in near future." Will notify RNCM in morning progression meeting.  [name] signing off. Consult again if any social work needs arise.  Dayton Scrape, [place]

## 2017-05-30 NOTE — Progress Notes (Signed)
  Echocardiogram 2D Echocardiogram has been performed.  Caston Coopersmith L Androw 05/30/2017, 3:40 PM

## 2017-05-31 LAB — BASIC METABOLIC PANEL
Anion gap: 10 (ref 5–15)
BUN: 11 mg/dL (ref 6–20)
CO2: 22 mmol/L (ref 22–32)
Calcium: 8.5 mg/dL — ABNORMAL LOW (ref 8.9–10.3)
Chloride: 101 mmol/L (ref 101–111)
Creatinine, Ser: 1.14 mg/dL (ref 0.61–1.24)
GFR calc Af Amer: >60 mL/min (ref >60)
GFR calc non Af Amer: >60 mL/min (ref >60)
Glucose, Bld: 125 mg/dL — ABNORMAL HIGH (ref 65–99)
Potassium: 3.2 mmol/L — ABNORMAL LOW (ref 3.5–5.1)
Sodium: 133 mmol/L — ABNORMAL LOW (ref 135–145)

## 2017-05-31 LAB — GLUCOSE, CAPILLARY
Glucose-Capillary: 94 mg/dL (ref 65–99)
Glucose-Capillary: 70 mg/dL (ref 65–99)

## 2017-05-31 LAB — CALCIUM, IONIZED: Calcium, Ionized, Serum: 4.5 mg/dL (ref 4.5–5.6)

## 2017-05-31 LAB — FOLATE RBC
Folate, Hemolysate: 284.5 ng/mL
Folate, RBC: 815 ng/mL (ref >498)
Hematocrit: 34.9 % — ABNORMAL LOW (ref 37.5–51.0)

## 2017-05-31 LAB — MAGNESIUM: Magnesium: 1.4 mg/dL — ABNORMAL LOW (ref 1.7–2.4)

## 2017-05-31 MED ORDER — INSULIN ASPART PROT & ASPART (70-30 MIX) 100 UNIT/ML ~~LOC~~ SUSP: 20.0000 [IU] | Freq: Two times a day (BID) | SUBCUTANEOUS | 11 refills | Status: DC

## 2017-05-31 MED ORDER — METOPROLOL TARTRATE 25 MG PO TABS: 25.0000 mg | ORAL_TABLET | Freq: Two times a day (BID) | ORAL | 2 refills | Status: DC

## 2017-05-31 MED ORDER — METFORMIN HCL 1000 MG PO TABS: 1000.0000 mg | ORAL_TABLET | Freq: Two times a day (BID) | ORAL | 2 refills | Status: DC

## 2017-05-31 MED ORDER — CLINDAMYCIN HCL 300 MG PO CAPS: 300.0000 mg | ORAL_CAPSULE | Freq: Three times a day (TID) | ORAL | 0 refills | Status: DC

## 2017-05-31 MED ORDER — ATORVASTATIN CALCIUM 40 MG PO TABS: 40.0000 mg | ORAL_TABLET | Freq: Every day | ORAL | 2 refills | Status: DC

## 2017-05-31 MED ORDER — OXYCODONE-ACETAMINOPHEN 5-325 MG PO TABS: 1.0000 | ORAL_TABLET | ORAL | 0 refills | Status: DC | PRN

## 2017-05-31 MED ORDER — AMLODIPINE BESYLATE 5 MG PO TABS: 5.0000 mg | ORAL_TABLET | Freq: Every day | ORAL | 2 refills | Status: DC

## 2017-05-31 MED ORDER — LISINOPRIL 20 MG PO TABS: 20.0000 mg | ORAL_TABLET | Freq: Every day | ORAL | 2 refills | Status: DC

## 2017-05-31 NOTE — Care Management Note (Signed)
Case Management Note  Patient Details  Name: Shankar Silber MRN: [id number] Date of Birth: 04/19/1966  Subjective/Objective:                    Action/Plan:   Expected Discharge Date:  05/31/17               Expected Discharge Plan:  Home/Self Care  In-House Referral:     Discharge planning Services  CM Consult, [facility], [facility], Medication Assistance  Post Acute Care Choice:  NA Choice offered to:     DME Arranged:    DME Agency:     HH Arranged:  NA HH Agency:  NA  Status of Service:  Completed, signed off  If discussed at [employer] of Stay Meetings, dates discussed:    Additional Comments:  Marilu Favre, RN 05/31/2017, 10:07 AM

## 2017-05-31 NOTE — Discharge Summary (Signed)
Physician Discharge Summary  Dawson Albers WHQ:[id number] DOB: 08-05-1965 DOA: 05/28/2017  PCP: Patient, No Pcp Per  Admit date: 05/28/2017 Discharge date: 05/31/2017  Time spent: 25 minutes  Recommendations for Outpatient Follow-up:  1. Complete clindamycin for tooth abscess 2. All meds including antihypertensives as well as insulin resumed this admission 3. Will need basic metabolic panel in 1 week 4. Has been instructed to follow-up with a dentist 5. Will need outpatient sleep apnea testing  Discharge Diagnoses:  Principal Problem:   Sepsis ([place]) Active Problems:   DM (diabetes mellitus) type 2, uncontrolled, without ketoacidosis   Hypomagnesemia   Odontogenic infection of jaw   Near syncope   Hypertensive urgency   Discharge Condition: Improved  Diet recommendation: Diabetic heart healthy  Filed Weights   05/28/17 1833 05/29/17 0900  Weight: 130.2 kg (287 lb) 128.1 kg (282 lb 6.6 oz)    History of present illness:  52 y/o male, Body mass index is 41.18 kg/m., htn,hld, dmt ty II, reflux L sided CP 10/2016 [facility]-TTE EF 55-60%-R heart cath-non obs. Admit from ED 1/6 with near syncopy as well as poor po intake for the last week in a setting of pain in R upper Jaw from tooth problems     Hospital Course:  Sepsis 2/2 dental abcess-narrow to PO clinda-pain control percocet 1 tab 4prn-sepsis physiology resolved Will need to complete clindamycin for treatment and will need to follow-up with an oral surgeon soon for possible extraction further workup of issues up with teeth   Tachycardia -NO AFIB.    Resume medications and tachycardia was secondary to sepsis physiology   HTn urgency-very poor control-in addition OSA habitus-will needs testing for the same--adding Lisinopril 20 to Metoprolol 25 bid and amlodipine 5 qd   DM ty ii-home meds 70/30 25 U and metformin-resuming both-d/c SSI but check qid today  Non obs disease on cath 10/2016-risk factor modif as OP   Body mass index  is 40.52 kg/m.-as above   hld-re-order atorvastatin on d/c   Procedures:  Echocardiogram EF 65-99% diastolic dysfunction   Consultations:  None  Discharge Exam: Vitals:   05/31/17 0606 05/31/17 0641  BP: (!) 158/113 (!) 165/106  Pulse: 71   Resp: 18   Temp: 98.4 F (36.9 C)   SpO2: 100%     General: Alert oriented no distress tolerating diet, swelling in mouth improved but in nad Cardiovascular: S1-S2 no murmur rub or gallop Respiratory: Clinically clear no added sound  Discharge Instructions    Allergies as of 05/31/2017      Reactions   Shellfish Allergy Hives      Medication List    STOP taking these medications   insulin NPH-regular Human (70-30) 100 UNIT/ML injection Commonly known as:  HUMULIN 70/30 Replaced by:  insulin aspart protamine- aspart (70-30) 100 UNIT/ML injection   traMADol 50 MG tablet Commonly known as:  ULTRAM     TAKE these medications   amLODipine 5 MG tablet Commonly known as:  NORVASC Take 1 tablet (5 mg total) by mouth daily. What changed:  Another medication with the same name was added. Make sure you understand how and when to take each.   amLODipine 5 MG tablet Commonly known as:  NORVASC Take 1 tablet (5 mg total) by mouth daily. What changed:  You were already taking a medication with the same name, and this prescription was added. Make sure you understand how and when to take each.   aspirin 325 MG tablet Take 325 mg by  mouth daily as needed for moderate pain.   atorvastatin 40 MG tablet Commonly known as:  LIPITOR Take 1 tablet (40 mg total) by mouth daily at 6 PM.   clindamycin 300 MG capsule Commonly known as:  CLEOCIN Take 1 capsule (300 mg total) by mouth every 8 (eight) hours.   ibuprofen 200 MG tablet Commonly known as:  ADVIL,MOTRIN Take 400 mg by mouth every 6 (six) hours as needed.   insulin aspart protamine- aspart (70-30) 100 UNIT/ML injection Commonly known as:  NOVOLOG MIX 70/30 Inject 0.2 mLs (20  Units total) into the skin 2 (two) times daily with a meal. Replaces:  insulin NPH-regular Human (70-30) 100 UNIT/ML injection   lisinopril 20 MG tablet Commonly known as:  PRINIVIL,ZESTRIL Take 1 tablet (20 mg total) by mouth daily. What changed:    medication strength  how much to take   metFORMIN 1000 MG tablet Commonly known as:  GLUCOPHAGE Take 1 tablet (1,000 mg total) by mouth 2 (two) times daily with a meal.   metoprolol tartrate 25 MG tablet Commonly known as:  LOPRESSOR Take 1 tablet (25 mg total) by mouth 2 (two) times daily.   oxyCODONE-acetaminophen 5-325 MG tablet Commonly known as:  PERCOCET/ROXICET Take 1 tablet by mouth every 4 (four) hours as needed for severe pain.      Allergies  Allergen Reactions  . Shellfish Allergy Hives      The results of significant diagnostics from this hospitalization (including imaging, microbiology, ancillary and laboratory) are listed below for reference.    Significant Diagnostic Studies: Dg Chest 2 View  Result Date: 05/28/2017 CLINICAL DATA:  Patient with atrial fibrillation. EXAM: CHEST  2 VIEW COMPARISON:  Chest radiograph 02/08/2014. FINDINGS: Patient is rotated to the right. Stable cardiac and mediastinal contours. Bibasilar atelectasis. No pleural effusion or pneumothorax. Thoracic spine degenerative changes. IMPRESSION: No acute cardiopulmonary process. Electronically Signed   By: Lovey Newcomer M.D.   On: 05/28/2017 20:25   Ct Maxillofacial W Contrast  Result Date: 05/29/2017 CLINICAL DATA:  52 y/o  M; right upper dental pain. EXAM: CT MAXILLOFACIAL WITH CONTRAST TECHNIQUE: Multidetector CT imaging of the maxillofacial structures was performed with intravenous contrast. Multiplanar CT image reconstructions were also generated. CONTRAST:  37m ISOVUE-300 IOPAMIDOL (ISOVUE-300) INJECTION 61% COMPARISON:  None. FINDINGS: Osseous: Dental disease with multiple caries and periapical cysts. Outer cortical defect in right  maxillary alveolar bone associated with the right first premolar (series 3, image 48). There is edema within right pre maxillary fat and thickening of the right levator labii superioris. No abscess. Orbits: Negative. No traumatic or inflammatory finding. Sinuses: Left maxillary sinus mild mucosal thickening. Otherwise negative. Soft tissues: As above. Limited intracranial: No significant or unexpected finding. IMPRESSION: Right maxillary first premolar dental disease with cortical defect in maxillary alveolar bone. Edema within overlying right pre maxillary fat and thickening of the right levator labii superioris likely related to dental infection. No abscess. Electronically Signed   By: LKristine GarbeM.D.   On: 05/29/2017 02:40    Microbiology: Recent Results (from the past 240 hour(s))  Culture, blood (x 2)     Status: None (Preliminary result)   Collection Time: 05/29/17  6:25 AM  Result Value Ref Range Status   Specimen Description BLOOD RIGHT ANTECUBITAL  Final   Special Requests   Final    BOTTLES DRAWN AEROBIC AND ANAEROBIC Blood Culture adequate volume   Culture NO GROWTH 1 DAY  Final   Report Status PENDING  Incomplete  Culture, blood (x 2)     Status: None (Preliminary result)   Collection Time: 05/29/17  6:40 AM  Result Value Ref Range Status   Specimen Description BLOOD RIGHT HAND  Final   Special Requests   Final    BOTTLES DRAWN AEROBIC AND ANAEROBIC Blood Culture adequate volume   Culture NO GROWTH 1 DAY  Final   Report Status PENDING  Incomplete  MRSA PCR Screening     Status: None   Collection Time: 05/29/17  9:09 AM  Result Value Ref Range Status   MRSA by PCR NEGATIVE NEGATIVE Final    Comment:        The GeneXpert MRSA Assay (FDA approved for NASAL specimens only), is one component of a comprehensive MRSA colonization surveillance program. It is not intended to diagnose MRSA infection nor to guide or monitor treatment for MRSA infections.       Labs: Basic Metabolic Panel: Recent Labs  Lab 05/28/17 1832 05/29/17 0012 05/29/17 0909 05/30/17 0338  NA 132*  --  132* 136  K 2.9*  --  3.8 3.7  CL 99*  --  102 103  CO2 21*  --  19* 24  GLUCOSE 140*  --  137* 112*  BUN 7  --  8 8  CREATININE 1.33*  --  1.10 0.99  CALCIUM 8.2*  --  7.9* 7.9*  MG  --  1.3* 1.5*  --    Liver Function Tests: Recent Labs  Lab 05/30/17 0338  AST 28  ALT 25  ALKPHOS 130*  BILITOT 1.0  PROT 5.9*  ALBUMIN 2.5*   No results for input(s): LIPASE, AMYLASE in the last 168 hours. No results for input(s): AMMONIA in the last 168 hours. CBC: Recent Labs  Lab 05/28/17 1832  WBC 11.9*  HGB 12.5*  HCT 36.8*  MCV 102.5*  PLT 226   Cardiac Enzymes: Recent Labs  Lab 05/29/17 0625 05/29/17 1144 05/29/17 1811  TROPONINI 0.05* 0.05* 0.04*   BNP: BNP (last 3 results) No results for input(s): BNP in the last 8760 hours.  ProBNP (last 3 results) No results for input(s): PROBNP in the last 8760 hours.  CBG: Recent Labs  Lab 05/30/17 0826 05/30/17 1327 05/30/17 1603 05/30/17 1726 05/30/17 2055  GLUCAP 113* 86 104* 115* 102*       Signed:  Nita Sells MD   [facility] 05/31/2017, 7:47 AM

## 2017-05-31 NOTE — Progress Notes (Signed)
8638 Patient discharged to home. Verbalizes understanding of all discharge instructions including discharge medications and follow up MD visits.   1235 Patient transported home by friend.

## 2017-06-03 LAB — CULTURE, BLOOD (ROUTINE X 2)
Culture: NO GROWTH
Culture: NO GROWTH
Special Requests: ADEQUATE
Special Requests: ADEQUATE

## 2019-07-31 ENCOUNTER — Ambulatory Visit: Payer: Self-pay

## 2019-09-01 ENCOUNTER — Other Ambulatory Visit: Payer: Self-pay

## 2019-09-01 ENCOUNTER — Emergency Department (HOSPITAL_COMMUNITY): Payer: Medicaid Other

## 2019-09-01 ENCOUNTER — Inpatient Hospital Stay (HOSPITAL_COMMUNITY)
Admission: EM | Admit: 2019-09-01 | Discharge: 2019-09-05 | DRG: 683 | Disposition: A | Discharge status: Skilled Nursing Facility | Payer: Medicaid Other | Attending: Internal Medicine | Admitting: Internal Medicine

## 2019-09-01 DIAGNOSIS — R350 Frequency of micturition: Secondary | ICD-10-CM

## 2019-09-01 DIAGNOSIS — K5900 Constipation, unspecified: Secondary | ICD-10-CM

## 2019-09-01 DIAGNOSIS — N390 Urinary tract infection, site not specified: Secondary | ICD-10-CM | POA: Diagnosis present

## 2019-09-01 DIAGNOSIS — E871 Hypo-osmolality and hyponatremia: Secondary | ICD-10-CM | POA: Diagnosis present

## 2019-09-01 DIAGNOSIS — Z20822 Contact with and (suspected) exposure to covid-19: Secondary | ICD-10-CM | POA: Diagnosis present

## 2019-09-01 DIAGNOSIS — Z841 Family history of disorders of kidney and ureter: Secondary | ICD-10-CM

## 2019-09-01 DIAGNOSIS — Z91013 Allergy to seafood: Secondary | ICD-10-CM

## 2019-09-01 DIAGNOSIS — K7581 Nonalcoholic steatohepatitis (NASH): Secondary | ICD-10-CM | POA: Diagnosis present

## 2019-09-01 DIAGNOSIS — I493 Ventricular premature depolarization: Secondary | ICD-10-CM

## 2019-09-01 DIAGNOSIS — L03314 Cellulitis of groin: Secondary | ICD-10-CM | POA: Diagnosis present

## 2019-09-01 DIAGNOSIS — R911 Solitary pulmonary nodule: Secondary | ICD-10-CM | POA: Diagnosis present

## 2019-09-01 DIAGNOSIS — M79641 Pain in right hand: Secondary | ICD-10-CM | POA: Diagnosis not present

## 2019-09-01 DIAGNOSIS — I480 Paroxysmal atrial fibrillation: Secondary | ICD-10-CM | POA: Diagnosis present

## 2019-09-01 DIAGNOSIS — Z794 Long term (current) use of insulin: Secondary | ICD-10-CM

## 2019-09-01 DIAGNOSIS — Z83438 Family history of other disorder of lipoprotein metabolism and other lipidemia: Secondary | ICD-10-CM

## 2019-09-01 DIAGNOSIS — R0600 Dyspnea, unspecified: Secondary | ICD-10-CM

## 2019-09-01 DIAGNOSIS — I10 Essential (primary) hypertension: Secondary | ICD-10-CM | POA: Diagnosis present

## 2019-09-01 DIAGNOSIS — Z7982 Long term (current) use of aspirin: Secondary | ICD-10-CM

## 2019-09-01 DIAGNOSIS — K7030 Alcoholic cirrhosis of liver without ascites: Secondary | ICD-10-CM | POA: Diagnosis present

## 2019-09-01 DIAGNOSIS — N179 Acute kidney failure, unspecified: Secondary | ICD-10-CM | POA: Diagnosis not present

## 2019-09-01 DIAGNOSIS — R1013 Epigastric pain: Secondary | ICD-10-CM | POA: Diagnosis not present

## 2019-09-01 DIAGNOSIS — I441 Atrioventricular block, second degree: Secondary | ICD-10-CM | POA: Diagnosis present

## 2019-09-01 DIAGNOSIS — I452 Bifascicular block: Secondary | ICD-10-CM | POA: Diagnosis present

## 2019-09-01 DIAGNOSIS — K8050 Calculus of bile duct without cholangitis or cholecystitis without obstruction: Secondary | ICD-10-CM

## 2019-09-01 DIAGNOSIS — K429 Umbilical hernia without obstruction or gangrene: Secondary | ICD-10-CM | POA: Diagnosis present

## 2019-09-01 DIAGNOSIS — D539 Nutritional anemia, unspecified: Secondary | ICD-10-CM

## 2019-09-01 DIAGNOSIS — M7989 Other specified soft tissue disorders: Secondary | ICD-10-CM | POA: Diagnosis not present

## 2019-09-01 DIAGNOSIS — R262 Difficulty in walking, not elsewhere classified: Secondary | ICD-10-CM | POA: Diagnosis present

## 2019-09-01 DIAGNOSIS — Z79899 Other long term (current) drug therapy: Secondary | ICD-10-CM

## 2019-09-01 DIAGNOSIS — R918 Other nonspecific abnormal finding of lung field: Secondary | ICD-10-CM

## 2019-09-01 DIAGNOSIS — J45909 Unspecified asthma, uncomplicated: Secondary | ICD-10-CM | POA: Diagnosis not present

## 2019-09-01 DIAGNOSIS — Z6841 Body Mass Index (BMI) 40.0 and over, adult: Secondary | ICD-10-CM

## 2019-09-01 DIAGNOSIS — R160 Hepatomegaly, not elsewhere classified: Secondary | ICD-10-CM | POA: Diagnosis present

## 2019-09-01 DIAGNOSIS — K861 Other chronic pancreatitis: Secondary | ICD-10-CM | POA: Diagnosis present

## 2019-09-01 DIAGNOSIS — L89151 Pressure ulcer of sacral region, stage 1: Secondary | ICD-10-CM | POA: Diagnosis present

## 2019-09-01 DIAGNOSIS — Z66 Do not resuscitate: Secondary | ICD-10-CM | POA: Diagnosis not present

## 2019-09-01 DIAGNOSIS — Z87891 Personal history of nicotine dependence: Secondary | ICD-10-CM

## 2019-09-01 DIAGNOSIS — K219 Gastro-esophageal reflux disease without esophagitis: Secondary | ICD-10-CM | POA: Diagnosis present

## 2019-09-01 DIAGNOSIS — N492 Inflammatory disorders of scrotum: Secondary | ICD-10-CM

## 2019-09-01 DIAGNOSIS — F329 Major depressive disorder, single episode, unspecified: Secondary | ICD-10-CM | POA: Diagnosis present

## 2019-09-01 DIAGNOSIS — E1140 Type 2 diabetes mellitus with diabetic neuropathy, unspecified: Secondary | ICD-10-CM | POA: Diagnosis not present

## 2019-09-01 DIAGNOSIS — K5781 Diverticulitis of intestine, part unspecified, with perforation and abscess with bleeding: Secondary | ICD-10-CM

## 2019-09-01 DIAGNOSIS — K769 Liver disease, unspecified: Secondary | ICD-10-CM | POA: Diagnosis not present

## 2019-09-01 DIAGNOSIS — D529 Folate deficiency anemia, unspecified: Secondary | ICD-10-CM | POA: Diagnosis present

## 2019-09-01 DIAGNOSIS — R748 Abnormal levels of other serum enzymes: Secondary | ICD-10-CM

## 2019-09-01 DIAGNOSIS — I4892 Unspecified atrial flutter: Secondary | ICD-10-CM | POA: Diagnosis present

## 2019-09-01 DIAGNOSIS — E119 Type 2 diabetes mellitus without complications: Secondary | ICD-10-CM

## 2019-09-01 DIAGNOSIS — I491 Atrial premature depolarization: Secondary | ICD-10-CM

## 2019-09-01 DIAGNOSIS — R112 Nausea with vomiting, unspecified: Secondary | ICD-10-CM | POA: Diagnosis not present

## 2019-09-01 DIAGNOSIS — K8681 Exocrine pancreatic insufficiency: Secondary | ICD-10-CM | POA: Diagnosis present

## 2019-09-01 DIAGNOSIS — L8990 Pressure ulcer of unspecified site, unspecified stage: Secondary | ICD-10-CM | POA: Insufficient documentation

## 2019-09-01 DIAGNOSIS — Z833 Family history of diabetes mellitus: Secondary | ICD-10-CM

## 2019-09-01 DIAGNOSIS — E1165 Type 2 diabetes mellitus with hyperglycemia: Secondary | ICD-10-CM | POA: Diagnosis present

## 2019-09-01 DIAGNOSIS — R531 Weakness: Secondary | ICD-10-CM | POA: Diagnosis present

## 2019-09-01 DIAGNOSIS — R001 Bradycardia, unspecified: Secondary | ICD-10-CM | POA: Diagnosis not present

## 2019-09-01 DIAGNOSIS — M25431 Effusion, right wrist: Secondary | ICD-10-CM | POA: Diagnosis not present

## 2019-09-01 DIAGNOSIS — Z8249 Family history of ischemic heart disease and other diseases of the circulatory system: Secondary | ICD-10-CM

## 2019-09-01 DIAGNOSIS — R1084 Generalized abdominal pain: Secondary | ICD-10-CM

## 2019-09-01 DIAGNOSIS — F1010 Alcohol abuse, uncomplicated: Secondary | ICD-10-CM

## 2019-09-01 HISTORY — DX: Acute kidney failure, unspecified: N17.9

## 2019-09-01 HISTORY — DX: Sleep apnea, unspecified: G47.30

## 2019-09-01 LAB — URINALYSIS, ROUTINE W REFLEX MICROSCOPIC
Bilirubin Urine: NEGATIVE
Glucose, UA: NEGATIVE mg/dL
Hgb urine dipstick: NEGATIVE
Ketones, ur: 80 mg/dL — AB
Nitrite: NEGATIVE
Protein, ur: 100 mg/dL — AB
Specific Gravity, Urine: 1.016 (ref 1.005–1.030)
pH: 5 (ref 5.0–8.0)

## 2019-09-01 LAB — CBC WITH DIFFERENTIAL/PLATELET
Abs Immature Granulocytes: 0.06 10*3/uL (ref 0.00–0.07)
Basophils Absolute: 0 10*3/uL (ref 0.0–0.1)
Basophils Relative: 0 %
Eosinophils Absolute: 0 10*3/uL (ref 0.0–0.5)
Eosinophils Relative: 0 %
HCT: 26.4 % — ABNORMAL LOW (ref 39.0–52.0)
Hemoglobin: 8.5 g/dL — ABNORMAL LOW (ref 13.0–17.0)
Immature Granulocytes: 1 %
Lymphocytes Relative: 8 %
Lymphs Abs: 0.6 10*3/uL — ABNORMAL LOW (ref 0.7–4.0)
MCH: 33.3 pg (ref 26.0–34.0)
MCHC: 32.2 g/dL (ref 30.0–36.0)
MCV: 103.5 fL — ABNORMAL HIGH (ref 80.0–100.0)
Monocytes Absolute: 0.7 10*3/uL (ref 0.1–1.0)
Monocytes Relative: 10 %
Neutro Abs: 6 10*3/uL (ref 1.7–7.7)
Neutrophils Relative %: 81 %
Platelets: 241 10*3/uL (ref 150–400)
RBC: 2.55 MIL/uL — ABNORMAL LOW (ref 4.22–5.81)
RDW: 14.6 % (ref 11.5–15.5)
WBC: 7.4 10*3/uL (ref 4.0–10.5)
nRBC: 0 % (ref 0.0–0.2)

## 2019-09-01 LAB — POCT I-STAT EG7
Acid-base deficit: 8 mmol/L — ABNORMAL HIGH (ref 0.0–2.0)
Bicarbonate: 16.4 mmol/L — ABNORMAL LOW (ref 20.0–28.0)
Calcium, Ion: 1.19 mmol/L (ref 1.15–1.40)
HCT: 28 % — ABNORMAL LOW (ref 39.0–52.0)
Hemoglobin: 9.5 g/dL — ABNORMAL LOW (ref 13.0–17.0)
O2 Saturation: 73 %
Potassium: 3.7 mmol/L (ref 3.5–5.1)
Sodium: 131 mmol/L — ABNORMAL LOW (ref 135–145)
TCO2: 17 mmol/L — ABNORMAL LOW (ref 22–32)
pCO2, Ven: 29.1 mmHg — ABNORMAL LOW (ref 44.0–60.0)
pH, Ven: 7.36 (ref 7.250–7.430)
pO2, Ven: 39 mmHg (ref 32.0–45.0)

## 2019-09-01 LAB — COMPREHENSIVE METABOLIC PANEL
ALT: 15 U/L (ref 0–44)
AST: 25 U/L (ref 15–41)
Albumin: 3.2 g/dL — ABNORMAL LOW (ref 3.5–5.0)
Alkaline Phosphatase: 72 U/L (ref 38–126)
Anion gap: 26 — ABNORMAL HIGH (ref 5–15)
BUN: 14 mg/dL (ref 6–20)
CO2: 15 mmol/L — ABNORMAL LOW (ref 22–32)
Calcium: 9.3 mg/dL (ref 8.9–10.3)
Chloride: 91 mmol/L — ABNORMAL LOW (ref 98–111)
Creatinine, Ser: 1.74 mg/dL — ABNORMAL HIGH (ref 0.61–1.24)
GFR calc Af Amer: 50 mL/min — ABNORMAL LOW (ref >60)
GFR calc non Af Amer: 43 mL/min — ABNORMAL LOW (ref >60)
Glucose, Bld: 148 mg/dL — ABNORMAL HIGH (ref 70–99)
Potassium: 3.7 mmol/L (ref 3.5–5.1)
Sodium: 132 mmol/L — ABNORMAL LOW (ref 135–145)
Total Bilirubin: 1.8 mg/dL — ABNORMAL HIGH (ref 0.3–1.2)
Total Protein: 7.8 g/dL (ref 6.5–8.1)

## 2019-09-01 LAB — LACTIC ACID, PLASMA
Lactic Acid, Venous: 1.5 mmol/L (ref 0.5–1.9)
Lactic Acid, Venous: 1.7 mmol/L (ref 0.5–1.9)

## 2019-09-01 LAB — TROPONIN I (HIGH SENSITIVITY)
Troponin I (High Sensitivity): 18 ng/L — ABNORMAL HIGH (ref <18)
Troponin I (High Sensitivity): 21 ng/L — ABNORMAL HIGH (ref <18)

## 2019-09-01 LAB — RAPID URINE DRUG SCREEN, HOSP PERFORMED
Amphetamines: NOT DETECTED
Barbiturates: NOT DETECTED
Benzodiazepines: NOT DETECTED
Cocaine: NOT DETECTED
Opiates: POSITIVE — AB
Tetrahydrocannabinol: NOT DETECTED

## 2019-09-01 LAB — LIPASE, BLOOD: Lipase: 174 U/L — ABNORMAL HIGH (ref 11–51)

## 2019-09-01 LAB — SALICYLATE LEVEL: Salicylate Lvl: <7 mg/dL — ABNORMAL LOW (ref 7.0–30.0)

## 2019-09-01 LAB — ETHANOL: Alcohol, Ethyl (B): <10 mg/dL (ref <10)

## 2019-09-01 LAB — ACETAMINOPHEN LEVEL: Acetaminophen (Tylenol), Serum: <10 ug/mL — ABNORMAL LOW (ref 10–30)

## 2019-09-01 MED ORDER — MORPHINE SULFATE (PF) 4 MG/ML IV SOLN
4.0000 mg | Freq: Once | INTRAVENOUS | Status: AC
  Administered 2019-09-01: 16:00:00 4 mg via INTRAVENOUS
  Filled 2019-09-01: qty 1

## 2019-09-01 MED ORDER — VITAMIN B-12 1000 MCG PO TABS
1000.0000 ug | ORAL_TABLET | Freq: Every day | ORAL | Status: DC
  Administered 2019-09-01 – 2019-09-05 (×5): 1000 ug via ORAL
  Filled 2019-09-01 (×5): qty 1

## 2019-09-01 MED ORDER — THIAMINE HCL 100 MG/ML IJ SOLN
100.0000 mg | Freq: Every day | INTRAMUSCULAR | Status: DC
  Administered 2019-09-01: 16:00:00 100 mg via INTRAVENOUS
  Filled 2019-09-01: qty 2

## 2019-09-01 MED ORDER — LORAZEPAM 2 MG/ML IJ SOLN: 0.0000 mg | Freq: Two times a day (BID) | INTRAMUSCULAR | Status: DC

## 2019-09-01 MED ORDER — ACETAMINOPHEN 650 MG RE SUPP: 650.0000 mg | Freq: Four times a day (QID) | RECTAL | Status: DC | PRN

## 2019-09-01 MED ORDER — ONDANSETRON HCL 4 MG/2ML IJ SOLN: 4.0000 mg | Freq: Four times a day (QID) | INTRAMUSCULAR | Status: DC | PRN

## 2019-09-01 MED ORDER — LORAZEPAM 1 MG PO TABS
0.0000 mg | ORAL_TABLET | Freq: Two times a day (BID) | ORAL | Status: DC
  Administered 2019-09-04: 2 mg via ORAL
  Filled 2019-09-01: qty 2

## 2019-09-01 MED ORDER — LORAZEPAM 2 MG/ML IJ SOLN
0.0000 mg | Freq: Four times a day (QID) | INTRAMUSCULAR | Status: DC
  Administered 2019-09-01: 16:00:00 1 mg via INTRAVENOUS
  Filled 2019-09-01: qty 1

## 2019-09-01 MED ORDER — SODIUM CHLORIDE 0.9 % IV BOLUS
1000.0000 mL | Freq: Once | INTRAVENOUS | Status: AC
  Administered 2019-09-01: 1000 mL via INTRAVENOUS

## 2019-09-01 MED ORDER — LORAZEPAM 1 MG PO TABS
0.0000 mg | ORAL_TABLET | Freq: Four times a day (QID) | ORAL | Status: DC
  Administered 2019-09-01: 2 mg via ORAL
  Filled 2019-09-01: qty 2

## 2019-09-01 MED ORDER — ENOXAPARIN SODIUM 80 MG/0.8ML ~~LOC~~ SOLN
75.0000 mg | SUBCUTANEOUS | Status: DC
  Administered 2019-09-01 – 2019-09-04 (×4): 75 mg via SUBCUTANEOUS
  Filled 2019-09-01 (×3): qty 0.8
  Filled 2019-09-01: qty 0.75
  Filled 2019-09-01: qty 0.8

## 2019-09-01 MED ORDER — ASPIRIN 325 MG PO TABS
325.0000 mg | ORAL_TABLET | Freq: Every day | ORAL | Status: DC
  Administered 2019-09-02 – 2019-09-05 (×4): 325 mg via ORAL
  Filled 2019-09-01 (×4): qty 1

## 2019-09-01 MED ORDER — FAMOTIDINE 10 MG PO TABS
10.0000 mg | ORAL_TABLET | Freq: Two times a day (BID) | ORAL | Status: DC
  Administered 2019-09-01 – 2019-09-05 (×8): 10 mg via ORAL
  Filled 2019-09-01 (×8): qty 1

## 2019-09-01 MED ORDER — LACTATED RINGERS IV SOLN: INTRAVENOUS | Status: DC

## 2019-09-01 MED ORDER — POLYETHYLENE GLYCOL 3350 17 G PO PACK: 17.0000 g | PACK | Freq: Every day | ORAL | Status: DC | PRN

## 2019-09-01 MED ORDER — POLYETHYLENE GLYCOL 3350 17 G PO PACK: 17.0000 g | PACK | Freq: Every day | ORAL | Status: DC

## 2019-09-01 MED ORDER — ONDANSETRON HCL 4 MG PO TABS: 4.0000 mg | ORAL_TABLET | Freq: Four times a day (QID) | ORAL | Status: DC | PRN

## 2019-09-01 MED ORDER — CYCLOBENZAPRINE HCL 10 MG PO TABS: 10.0000 mg | ORAL_TABLET | Freq: Three times a day (TID) | ORAL | Status: DC | PRN

## 2019-09-01 MED ORDER — VANCOMYCIN HCL 1750 MG/350ML IV SOLN
1750.0000 mg | INTRAVENOUS | Status: DC
  Administered 2019-09-01 – 2019-09-02 (×2): 1750 mg via INTRAVENOUS
  Filled 2019-09-01 (×2): qty 350

## 2019-09-01 MED ORDER — THIAMINE HCL 100 MG PO TABS
100.0000 mg | ORAL_TABLET | Freq: Every day | ORAL | Status: DC
  Administered 2019-09-02 – 2019-09-05 (×4): 100 mg via ORAL
  Filled 2019-09-01 (×4): qty 1

## 2019-09-01 MED ORDER — ACETAMINOPHEN 325 MG PO TABS
650.0000 mg | ORAL_TABLET | Freq: Four times a day (QID) | ORAL | Status: DC | PRN
  Administered 2019-09-04 – 2019-09-05 (×3): 650 mg via ORAL
  Filled 2019-09-01 (×3): qty 2

## 2019-09-01 MED ORDER — ASPIRIN 325 MG PO TABS: 325.0000 mg | ORAL_TABLET | Freq: Every day | ORAL | Status: DC | PRN

## 2019-09-01 MED ORDER — SODIUM CHLORIDE 0.9% FLUSH
3.0000 mL | Freq: Two times a day (BID) | INTRAVENOUS | Status: DC
  Administered 2019-09-04 – 2019-09-05 (×3): 3 mL via INTRAVENOUS

## 2019-09-01 MED ORDER — SODIUM CHLORIDE 0.9 % IV SOLN
1.0000 g | INTRAVENOUS | Status: DC
  Administered 2019-09-01 – 2019-09-04 (×4): 1 g via INTRAVENOUS
  Filled 2019-09-01 (×3): qty 10
  Filled 2019-09-01 (×2): qty 1

## 2019-09-01 MED ORDER — LACTULOSE 10 GM/15ML PO SOLN
20.0000 g | Freq: Two times a day (BID) | ORAL | Status: DC
  Administered 2019-09-01 – 2019-09-02 (×3): 20 g via ORAL
  Filled 2019-09-01 (×4): qty 30

## 2019-09-01 MED ORDER — ONDANSETRON HCL 4 MG/2ML IJ SOLN
4.0000 mg | Freq: Once | INTRAMUSCULAR | Status: AC
  Administered 2019-09-01: 16:00:00 4 mg via INTRAVENOUS
  Filled 2019-09-01: qty 2

## 2019-09-01 MED ORDER — GABAPENTIN 300 MG PO CAPS
300.0000 mg | ORAL_CAPSULE | Freq: Three times a day (TID) | ORAL | Status: DC
  Administered 2019-09-01 – 2019-09-05 (×12): 300 mg via ORAL
  Filled 2019-09-01 (×12): qty 1

## 2019-09-01 NOTE — ED Notes (Signed)
Pt transported to CT for renal study.

## 2019-09-01 NOTE — Consult Note (Addendum)
Cardiology Consult Note  Reason for consult: 2nd degree AV block  HPI Allen Gould is a 54 y.o. male with a history of AF, chronic RBBB/LAFB, morbid obesity, alcohol use.   Presented to ED today with multiple complaints.  N/V x 4 days, epigastric pain 9/10, neuorpathic pain in feet, scrotal rash with pain and foul smell, dysuria.  Drinking 2-3 fifths ETOH per day.  In ED ECG showed 2nd degree AVB, so cardiology consulted.  Patient to be admitted to medicine for cellulitis and N/V.  No syncope.  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  HR 50s.  Most recent ECGs here 2019 show likely AFL with RBBB/LAFB with HR in 110s.  Had admission in 11/2018 at [facility] for scrotal abscess.  Per notes was in AF RVR on admission. Has been on apixaban and rivaroxaban in the past.  Echo 2019 with normal LV EF  ASSESSMENT/PLAN 1. 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  The rhythm is somewhat difficult to interpret due to varying levels of AV conduction (predominantly 2:1) and PACs, but there are clear periods of PR prolongation preceding dropped beats.  However, he has baseline bifascicular block and is at risk for developing high grade AV block in the future.  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  Would monitor on telemetry while admitted.  He may require a pacemaker prior to discharge if AV block worsens.  -- no acute indication for pacemaker implantation -- monitor on telemetry  2. History of AF/AFL Per prior notes he has a history of AF, and he has some ECGs here from 2019 that could be AFL with 2:1 conduction.  He has previously been on anticoagulation.  CHADS-VASc=2 (HTN, DM).  He is currently receiving lovenox.  We can consider reinitiating of a DOAC on discharge.  Cardiology will continue to  follow.      Cardiology Consultation:   Patient ID: Allen Gould; [id number]; 07/29/1965   Admit date: 09/01/2019 Date of Consult: 09/01/2019  Primary Care Provider: Patient, No Pcp Per Primary Cardiologist: No primary care provider on file. Primary Electrophysiologist:  None   Past Medical History:  Diagnosis Date  . Asthma   . Diabetes mellitus without complication ([place])   . GERD (gastroesophageal reflux disease)   . Hypertension     Past Surgical History:  Procedure Laterality Date  . APPENDECTOMY       Inpatient Medications: Scheduled Meds: . [START ON 09/02/2019] aspirin  325 mg Oral Daily  . enoxaparin (LOVENOX) injection  75 mg Subcutaneous Q24H  . famotidine  10 mg Oral BID  . gabapentin  300 mg Oral TID  . lactulose  20 g Oral BID  . LORazepam  0-4 mg Intravenous Q6H   Or  . LORazepam  0-4 mg Oral Q6H  . [START ON 09/03/2019] LORazepam  0-4 mg Intravenous Q12H   Or  . [START ON 09/03/2019] LORazepam  0-4 mg Oral Q12H  . sodium chloride flush  3 mL Intravenous Q12H  . thiamine  100 mg Oral Daily   Or  . thiamine  100 mg Intravenous Daily  . vitamin B-12  1,000 mcg Oral Daily   Continuous Infusions: . cefTRIAXone (ROCEPHIN)  IV Stopped (09/01/19 1957)  . lactated ringers 125 mL/hr at 09/01/19 1957  . vancomycin 1,750 mg (09/01/19 1959)  PRN Meds: acetaminophen **OR** acetaminophen, ondansetron **OR** ondansetron (ZOFRAN) IV, polyethylene glycol  Home Meds: Prior to Admission medications   Medication Sig Start Date End Date Taking? Authorizing Provider  amLODipine (NORVASC) 10 MG tablet Take 10 mg by mouth daily.   Yes [provider]  aspirin 325 MG tablet Take 325 mg by mouth daily as needed for moderate pain.   Yes [provider]  carvedilol (COREG) 12.5 MG tablet Take 12.5 mg by mouth 2 (two) times daily with a meal.   Yes [provider]  cyclobenzaprine (FLEXERIL) 10 MG tablet Take 10 mg by mouth in the morning, at  noon, and at bedtime.   Yes [provider]  gabapentin (NEURONTIN) 300 MG capsule Take 1 capsule by mouth in the morning, at noon, and at bedtime. 12/13/18  Yes [provider]  hydrochlorothiazide (HYDRODIURIL) 25 MG tablet Take 25 mg by mouth daily.   Yes [provider]  ibuprofen (ADVIL,MOTRIN) 200 MG tablet Take 400 mg by mouth every 6 (six) hours as needed.   Yes [provider]  nystatin (NYSTATIN) powder Apply 1 application topically 3 (three) times daily.   Yes [provider]  vitamin B-12 (CYANOCOBALAMIN) 1000 MCG tablet Take 1,000 mcg by mouth daily.   Yes [provider]  amLODipine (NORVASC) 5 MG tablet Take 1 tablet (5 mg total) by mouth daily. Patient not taking: Reported on 05/28/2017 08/26/14 08/26/15  Francesca Oman, DO  amLODipine (NORVASC) 5 MG tablet Take 1 tablet (5 mg total) by mouth daily. Patient not taking: Reported on 09/01/2019 05/31/17   Nita Sells, MD  atorvastatin (LIPITOR) 40 MG tablet Take 1 tablet (40 mg total) by mouth daily at 6 PM. Patient not taking: Reported on 09/01/2019 05/31/17   Nita Sells, MD  clindamycin (CLEOCIN) 300 MG capsule Take 1 capsule (300 mg total) by mouth every 8 (eight) hours. Patient not taking: Reported on 09/01/2019 05/31/17   Nita Sells, MD  insulin aspart protamine- aspart (NOVOLOG MIX 70/30) (70-30) 100 UNIT/ML injection Inject 0.2 mLs (20 Units total) into the skin 2 (two) times daily with a meal. Patient not taking: Reported on 09/01/2019 05/31/17   Nita Sells, MD  lisinopril (PRINIVIL,ZESTRIL) 20 MG tablet Take 1 tablet (20 mg total) by mouth daily. Patient not taking: Reported on 09/01/2019 05/31/17   Nita Sells, MD  metFORMIN (GLUCOPHAGE) 1000 MG tablet Take 1 tablet (1,000 mg total) by mouth 2 (two) times daily with a meal. Patient not taking: Reported on 09/01/2019 05/31/17   Nita Sells, MD  metoprolol tartrate (LOPRESSOR) 25 MG tablet  Take 1 tablet (25 mg total) by mouth 2 (two) times daily. Patient not taking: Reported on 09/01/2019 05/31/17   Nita Sells, MD  oxyCODONE-acetaminophen (PERCOCET/ROXICET) 5-325 MG tablet Take 1 tablet by mouth every 4 (four) hours as needed for severe pain. Patient not taking: Reported on 09/01/2019 05/31/17   Nita Sells, MD    Allergies:    Allergies  Allergen Reactions  . Shellfish Allergy Hives    Social History:   Social History   Socioeconomic History  . Marital status: Single    Spouse name: Not on file  . Number of children: Not on file  . Years of education: Not on file  . Highest education level: Not on file  Occupational History  . Not on file  Tobacco Use  . Smoking status: Former Smoker    Types: Cigarettes  . Smokeless tobacco: Never Used  . Tobacco comment:  1 cigerette a dayb 2-3 cigerettes per week  Substance and Sexual Activity  . Alcohol use: Yes    Alcohol/week: 2.0 standard drinks    Types: 2 Cans of beer per week    Comment: every other day  . Drug use: No  . Sexual activity: Not on file  Other Topics Concern  . Not on file  Social History Narrative  . Not on file   Social Determinants of Health   Financial Resource Strain:   . Difficulty of Paying Living Expenses:   Food Insecurity:   . Worried About Charity fundraiser in the Last Year:   . Arboriculturist in the Last Year:   Transportation Needs:   . Film/video editor (Medical):   [name] Lack of Transportation (Non-Medical):   Physical Activity:   . Days of Exercise per Week:   . Minutes of Exercise per Session:   Stress:   . Feeling of Stress :   Social Connections:   . Frequency of Communication with Friends and Family:   . Frequency of Social Gatherings with Friends and Family:   . Attends Religious Services:   . Active Member of Clubs or Organizations:   . Attends Archivist Meetings:   [name] Marital Status:   Intimate Partner Violence:   . Fear of Current  or Ex-Partner:   . Emotionally Abused:   [name] Physically Abused:   . Sexually Abused:      Family History:    Family History  Problem Relation Age of Onset  . Kidney disease Mother   . Diabetes Sister   . Hyperlipidemia Sister   . Heart disease Other       ROS:  Please see the history of present illness.   All other ROS reviewed and negative.     Physical Exam/Data:   Vitals:   09/01/19 1913 09/01/19 1945 09/01/19 2015 09/01/19 2052  BP:  (!) 117/58 114/72 132/64  Pulse:  (!) 54  (!) 46  Resp:  19 17 20   Temp: 98.2 F (36.8 C)   98.1 F (36.7 C)  TempSrc:    Oral  SpO2:  98%  92%  Weight:      Height:        Intake/Output Summary (Last 24 hours) at 09/01/2019 2112 Last data filed at 09/01/2019 1957 Gross per 24 hour  Intake 100 ml  Output --  Net 100 ml   Last 3 Weights 09/01/2019 05/29/2017 05/28/2017  Weight (lbs) 345 lb 282 lb 6.6 oz 287 lb  Weight (kg) 156.491 kg 128.1 kg 130.182 kg     Body mass index is 49.5 kg/m.  General: Well developed, well nourished, in no acute distress. Head: Normocephalic, atraumatic, sclera non-icteric, no xanthomas, nares are without discharge.  Neck: Negative for carotid bruits. JVD not elevated. Lungs: Clear bilaterally to auscultation without wheezes, rales, or rhonchi. Breathing is unlabored. Heart: RRR with S1 S2. No murmurs, rubs, or gallops appreciated. Abdomen: Soft, non-tender, non-distended with normoactive bowel sounds. No hepatomegaly. No rebound/guarding. No obvious abdominal masses. Msk:  Strength and tone appear normal for age. Extremities: No clubbing or cyanosis. No edema.  Distal pedal pulses are 2+ and equal bilaterally. Neuro: Alert and oriented X 3. No facial asymmetry. No focal deficit. Moves all extremities spontaneously. Psych:  Responds to questions appropriately with a normal affect.    Laboratory Data:  High Sensitivity Troponin:   Recent Labs  Lab 09/01/19 1604 09/01/19 1828  TROPONINIHS 18*  21*      Cardiac EnzymesNo results for input(s): TROPONINI in the last 168 hours. No results for input(s): TROPIPOC in the last 168 hours.  Chemistry Recent Labs  Lab 09/01/19 1308 09/01/19 1613  NA 132* 131*  K 3.7 3.7  CL 91*  --   CO2 15*  --   GLUCOSE 148*  --   BUN 14  --   CREATININE 1.74*  --   CALCIUM 9.3  --   GFRNONAA 43*  --   GFRAA 50*  --   ANIONGAP 26*  --     Recent Labs  Lab 09/01/19 1308  PROT 7.8  ALBUMIN 3.2*  AST 25  ALT 15  ALKPHOS 72  BILITOT 1.8*   Hematology Recent Labs  Lab 09/01/19 1308 09/01/19 1613  WBC 7.4  --   RBC 2.55*  --   HGB 8.5* 9.5*  HCT 26.4* 28.0*  MCV 103.5*  --   MCH 33.3  --   MCHC 32.2  --   RDW 14.6  --   PLT 241  --    BNPNo results for input(s): BNP, PROBNP in the last 168 hours.  DDimer No results for input(s): DDIMER in the last 168 hours.   Radiology/Studies:  DG Abdomen Acute W/Chest  Result Date: 09/01/2019 CLINICAL DATA:  Emesis EXAM: DG ABDOMEN ACUTE W/ 1V CHEST COMPARISON:  None. FINDINGS: Limited exam secondary to poor penetration from patient body habitus. Relative paucity of bowel gas throughout the abdomen with scattered air in stool seen within the colon. There is no evidence of dilated bowel loops or free intraperitoneal air within the limitations of this exam. No radiopaque calculi or other significant radiographic abnormality is seen. Heart size and mediastinal contours are within normal limits. 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 IMPRESSION: Paucity of bowel gas throughout the abdomen with scattered air and stool within the colon. No evidence of bowel obstruction. Electronically Signed   By: Davina Poke D.O.   On: 09/01/2019 13:28   CT Renal Stone Study  Result Date: 09/01/2019 CLINICAL DATA:  Hematuria unknown cause EXAM: CT ABDOMEN AND PELVIS WITHOUT CONTRAST TECHNIQUE: Multidetector CT imaging of the abdomen and pelvis was performed following the standard protocol without  IV contrast. COMPARISON:  None FINDINGS: Lower chest: Nodule in the left lung base 8 mm. Calcified coronary artery disease. Normal heart size. No pericardial effusion. Hepatobiliary: Marked hepatic steatosis with nodular morphology. Lesion in the posterior right hemi liver measures 2 x 2.3 cm less dense than surrounding fatty changes. Abundant gallstones in the gallbladder. No pericholecystic stranding. Gallbladder distension is moderate. No gross biliary ductal dilation. Pancreas: Pancreas is normal without focal lesion or ductal dilation. No peripancreatic inflammation. Spleen: Spleen is normal size without focal lesion. Adrenals/Urinary Tract: Adrenal glands are normal. No nephrolithiasis or hydronephrosis. Bilateral perinephric stranding without focal fluid on noncontrast imaging. Stomach/Bowel: Small hiatal hernia. Small bowel is normal caliber. Post appendectomy. Suggestion of mural stratification of the ascending colon. Colon is under distended limiting assessment. Stool throughout the remainder of the colon. Vascular/Lymphatic: Scattered atherosclerosis of the abdominal aorta. No adenopathy in the upper abdomen or in the retroperitoneum. No pelvic lymphadenopathy. Reproductive: Prostate unremarkable by CT. Other: No free air. No ascites. Fat containing umbilical hernia. Musculoskeletal: Spinal degenerative changes. Degenerative changes in the bilateral hips. No acute bone finding or destructive bone process. IMPRESSION: 1. No nephrolithiasis or hydronephrosis. No findings to explain hematuria on noncontrast imaging. Bilateral perinephric stranding could be seen in  the setting of urinary tract infection but is nonspecific. Consider follow-up imaging utilizing hematuria protocol on follow-up when appropriate. 2. Marked hepatic steatosis with nodular morphology. Lesion in the posterior right hemi liver measures 2 x 2.3 cm less dense than surrounding fatty changes. This is of uncertain significance but the  patient appears to have findings of liver disease and potentially early cirrhosis. Would suggest follow-up multiphase imaging of the liver on an outpatient basis to further assess for underlying liver lesions. In the meantime correlation with clinical and laboratory studies to assess for clinical signs of liver disease is suggested. 3. Gallbladder distension without pericholecystic stranding likely with extensive sludge and/or stones in the gallbladder. 4. Suggestion of mural stratification of the ascending colon. This could be due to underdistention, mild colitis or more likely prior colitis. 5. 8 mm pulmonary nodule in the left lung base. Non-contrast chest CT at 6-12 months is recommended. If the nodule is stable at time of repeat CT, then future CT at 18-24 months (from today's scan) is considered optional for low-risk patients, but is recommended for high-risk patients. This recommendation follows the consensus statement: Guidelines for Management of Incidental Pulmonary Nodules Detected on CT Images: From the Fleischner Society 2017; Radiology 2017; 284:228-243. 6. Fat containing umbilical hernia. 7. Aortic atherosclerosis. These results were called by telephone at the time of interpretation on 09/01/2019 at 5:23 pm to provider Nuala Alpha , who verbally acknowledged these results. Aortic Atherosclerosis (ICD10-I70.0). Electronically Signed   By: Zetta Bills M.D.   On: 09/01/2019 17:20    Signed, Chriss Czar, MD  09/01/2019 9:12 PM

## 2019-09-01 NOTE — ED Notes (Signed)
Patient transported to Ultrasound 

## 2019-09-01 NOTE — ED Provider Notes (Signed)
Care handoff received from [facility] PA-C at shift change, please see previous provider note for full details.  54 year old male history diabetes, asthma, hypertension, obesity.  Multiple complaints on arrival, requesting SNF placement due to inability to walk and perform ADLs.  Generalized abdominal pain with nausea and vomiting x4 days and no bowel movement x1 week.  Examination significant for a reproducible umbilical hernia, stage I sacral pressure ulcer.  Labs show baseline hemoglobin of 8.5.  Hyponatremia of 132.  Hyperglycemia of 148.  New AKI, creatinine 1.74.  Anion gap 26.  Lipase elevated at 174.  Chest x-ray with acute abdomen obtained which showed nonobstructive bowel gas pattern.  At shift change VBG, UDS, Tylenol level, salicylate level and CT abdomen pelvis were pending.  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 Darl Householder.  Plan of care was to consult cardiology regarding heart block and following remainder of labs and imaging admit to medicine service.  CIWA protocol placed and care transition team consulted by prior provider.  Patient has received morphine, Zofran, thiamine and was started on 2 L normal saline bolus by previous team. Physical Exam  BP (!) 150/78 (BP Location: Right Arm)   Pulse (!) 50   Temp 98.2 F (36.8 C)   Resp 17   Ht 5' 10"  (1.778 m)   Wt (!) 156.5 kg   SpO2 100%   BMI 49.50 kg/m   Physical Exam Constitutional:      General: He is not in acute distress.    Appearance: Normal appearance. He is well-developed. He is obese. He is ill-appearing. He is not toxic-appearing or diaphoretic.  HENT:     Head: Normocephalic and atraumatic.     Right Ear: External ear normal.     Left Ear: External ear normal.     Nose: Nose normal.  Eyes:     General: Vision grossly intact. Gaze aligned appropriately.     Pupils: Pupils are equal, round, and reactive to light.  Neck:     Trachea: Trachea and  phonation normal. No tracheal deviation.  Pulmonary:     Effort: Pulmonary effort is normal. No respiratory distress.  Musculoskeletal:        General: Normal range of motion.     Cervical back: Normal range of motion.  Skin:    General: Skin is warm and dry.  Neurological:     Mental Status: He is alert.     GCS: GCS eye subscore is 4. GCS verbal subscore is 5. GCS motor subscore is 6.     Comments: Speech is clear and goal oriented, follows commands Major Cranial nerves without deficit, no facial droop Moves extremities without ataxia, coordination intact  Psychiatric:        Behavior: Behavior normal.     ED Course/Procedures     Procedures  MDM  High-sensitivity troponin minimally elevated at 18.  Ethanol negative, patient exhibits no evidence of intoxication or withdrawal at this time, Ativan ordered by previous team.  CT renal stone study:  IMPRESSION:  1. No nephrolithiasis or hydronephrosis. No findings to explain  hematuria on noncontrast imaging. Bilateral perinephric stranding  could be seen in the setting of urinary tract infection but is  nonspecific. Consider follow-up imaging utilizing hematuria protocol  on follow-up when appropriate.  2. Marked hepatic steatosis with nodular morphology. Lesion in the  posterior right hemi liver measures 2 x 2.3 cm less dense than  surrounding fatty  changes. This is of uncertain significance but the  patient appears to have findings of liver disease and potentially  early cirrhosis. Would suggest follow-up multiphase imaging of the  liver on an outpatient basis to further assess for underlying liver  lesions. In the meantime correlation with clinical and laboratory  studies to assess for clinical signs of liver disease is suggested.  3. Gallbladder distension without pericholecystic stranding likely  with extensive sludge and/or stones in the gallbladder.  4. Suggestion of mural stratification of the ascending colon. This   could be due to underdistention, mild colitis or more likely prior  colitis.  5. 8 mm pulmonary nodule in the left lung base. Non-contrast chest  CT at 6-12 months is recommended. If the nodule is stable at time of  repeat CT, then future CT at 18-24 months (from today's scan) is  considered optional for low-risk patients, but is recommended for  high-risk patients. This recommendation follows the consensus  statement: Guidelines for Management of Incidental Pulmonary Nodules  Detected on CT Images: From the Fleischner Society 2017; Radiology  2017; 284:228-243.  6. Fat containing umbilical hernia.  7. Aortic atherosclerosis.  - On reevaluation patient resting comfortably no acute distress he has no current complaints symptoms earlier resolved following morphine and Zofran.  He is agreeable for admission.  Dr. Darl Householder discussed case with cardiologist Dr. Charissa Bash who will consult on patient. - At 6:15 PM: I discussed the case in person with residency team who accepted patient to their service.  I have asked nursing staff to obtain urine sample for evaluation of possible pyelonephritis/UTI.    Note: Portions of this report may have been transcribed using voice recognition software. Every effort was made to ensure accuracy; however, inadvertent computerized transcription errors may still be present.   Gari Crown 09/01/19 1914    Drenda Freeze, MD 09/02/19 [phone]

## 2019-09-01 NOTE — ED Notes (Signed)
1 unsuccessful attempt for blood draw for cultures

## 2019-09-01 NOTE — ED Provider Notes (Signed)
[facility] EMERGENCY DEPARTMENT Provider Note   CSN: [id number] Arrival date & time: 09/01/19  1146    History Chief Complaint  Patient presents with  . Abdominal Pain    Allen Gould is a 54 y.o. male with past medical history significant for diabetes, asthma, hypertension who presents for evaluation of multiple complaints.  Patient states he can no longer take care of himself.  He is now living with his sister however he lays in a recliner and is unable to walk to even do his ADLs.  He occasionally will get food from his family however patient states he has had generalized abdominal pain, nausea and vomiting x4 days.  Has not had a bowel movement x1 week.  He is requesting placement after discharge.  Denies headache, lightheadedness, dizziness, chest pain, shortness of breath, cough, dysuria, unilateral weakness, headache.  Admits to generalized weakness.  Denies additional aggravating or alleviating factors.  Has not take anything for his symptoms.  States he has felt too nauseous to take any of his medications x1 week.  History obtained from patient and past medical records.  No interpreter is used.  Hx of EtOh abuse, no Hx of Dt, WD seizure. CIWA placed.  HPI     Past Medical History:  Diagnosis Date  . Asthma   . Diabetes mellitus without complication ([place])   . GERD (gastroesophageal reflux disease)   . Hypertension     Patient Active Problem List   Diagnosis Date Noted  . Sepsis ([place]) 05/29/2017  . Hypomagnesemia 05/29/2017  . Odontogenic infection of jaw 05/29/2017  . Near syncope 05/29/2017  . Hypertensive urgency 05/29/2017  . Cough 08/09/2014  . Balanitis 08/09/2014  . Financial difficulties 08/09/2014  . Health care maintenance 03/01/2014  . Dyslipidemia 03/01/2014  . Morbid obesity ([place]) 08/29/2013  . Right shoulder pain 08/29/2013  . Insomnia 08/29/2013  . DM (diabetes mellitus) type 2, uncontrolled, without ketoacidosis 08/10/2013  .  Hypertension   . Asthma   . GERD (gastroesophageal reflux disease)     Past Surgical History:  Procedure Laterality Date  . APPENDECTOMY         Family History  Problem Relation Age of Onset  . Kidney disease Mother   . Diabetes Sister   . Hyperlipidemia Sister   . Heart disease Other     Social History   Tobacco Use  . Smoking status: Former Smoker    Types: Cigarettes  . Smokeless tobacco: Never Used  . Tobacco comment: 1 cigerette a dayb 2-3 cigerettes per week  Substance Use Topics  . Alcohol use: Yes    Alcohol/week: 2.0 standard drinks    Types: 2 Cans of beer per week    Comment: every other day  . Drug use: No    Home Medications Prior to Admission medications   Medication Sig Start Date End Date Taking? Authorizing Provider  amLODipine (NORVASC) 5 MG tablet Take 1 tablet (5 mg total) by mouth daily. Patient not taking: Reported on 05/28/2017 08/26/14 08/26/15  Francesca Oman, DO  amLODipine (NORVASC) 5 MG tablet Take 1 tablet (5 mg total) by mouth daily. 05/31/17   Nita Sells, MD  aspirin 325 MG tablet Take 325 mg by mouth daily as needed for moderate pain.    [provider]  atorvastatin (LIPITOR) 40 MG tablet Take 1 tablet (40 mg total) by mouth daily at 6 PM. 05/31/17   Nita Sells, MD  clindamycin (CLEOCIN) 300 MG capsule Take 1  capsule (300 mg total) by mouth every 8 (eight) hours. 05/31/17   Nita Sells, MD  ibuprofen (ADVIL,MOTRIN) 200 MG tablet Take 400 mg by mouth every 6 (six) hours as needed.    [provider]  insulin aspart protamine- aspart (NOVOLOG MIX 70/30) (70-30) 100 UNIT/ML injection Inject 0.2 mLs (20 Units total) into the skin 2 (two) times daily with a meal. 05/31/17   Nita Sells, MD  lisinopril (PRINIVIL,ZESTRIL) 20 MG tablet Take 1 tablet (20 mg total) by mouth daily. 05/31/17   Nita Sells, MD  metFORMIN (GLUCOPHAGE) 1000 MG tablet Take 1 tablet (1,000 mg total) by mouth 2 (two) times  daily with a meal. 05/31/17   Nita Sells, MD  metoprolol tartrate (LOPRESSOR) 25 MG tablet Take 1 tablet (25 mg total) by mouth 2 (two) times daily. 05/31/17   Nita Sells, MD  oxyCODONE-acetaminophen (PERCOCET/ROXICET) 5-325 MG tablet Take 1 tablet by mouth every 4 (four) hours as needed for severe pain. 05/31/17   Nita Sells, MD    Allergies    Shellfish allergy  Review of Systems   Review of Systems  Constitutional: Positive for activity change and fatigue. Negative for appetite change, chills, fever and unexpected weight change.  HENT: Negative.   Respiratory: Negative.   Cardiovascular: Negative.   Gastrointestinal: Positive for abdominal pain, constipation, nausea and vomiting. Negative for abdominal distention, anal bleeding, blood in stool, diarrhea and rectal pain.  Genitourinary: Negative.   Musculoskeletal: Negative.   Skin: Negative.   Neurological: Positive for weakness (Generalized).  All other systems reviewed and are negative.   Physical Exam Updated Vital Signs BP 140/77   Pulse (!) 58   Resp 15   Ht 5' 10"  (1.778 m)   Wt (!) 156.5 kg   SpO2 100%   BMI 49.50 kg/m   Physical Exam Vitals and nursing note reviewed. Exam conducted with a chaperone present.  Constitutional:      General: He is not in acute distress.    Appearance: He is well-developed. He is obese. He is not ill-appearing, toxic-appearing or diaphoretic.  HENT:     Head: Normocephalic and atraumatic.     Mouth/Throat:     Mouth: Mucous membranes are moist.  Eyes:     Pupils: Pupils are equal, round, and reactive to light.  Cardiovascular:     Rate and Rhythm: Normal rate and regular rhythm.     Pulses:          Dorsalis pedis pulses are 1+ on the right side and 1+ on the left side.       Posterior tibial pulses are 1+ on the right side and 1+ on the left side.     Heart sounds: Normal heart sounds.  Pulmonary:     Effort: Pulmonary effort is normal. No respiratory  distress.     Breath sounds: Normal breath sounds. No wheezing or rales.  Chest:     Chest wall: No tenderness.  Abdominal:     General: Bowel sounds are normal. There is no distension.     Palpations: Abdomen is soft.     Tenderness: There is generalized abdominal tenderness. There is no right CVA tenderness, left CVA tenderness, guarding or rebound. Negative signs include Murphy's sign and McBurney's sign.     Hernia: A hernia is present. Hernia is present in the umbilical area (Easily reducable periumbilical hernia).  Genitourinary:    Comments: No stool in rectal vault Musculoskeletal:        General: Normal  range of motion.     Cervical back: Normal range of motion and neck supple.     Comments: Stage I pressure wound to distal sacrum and gluteal crease.  Leading or drainage.  No warmth, fluctuance or induration  Feet:     Right foot:     Skin integrity: Callus and dry skin present.     Toenail Condition: Right toenails are abnormally thick and long.     Left foot:     Skin integrity: Callus and dry skin present.     Toenail Condition: Left toenails are abnormally thick and long.     Comments: Soft edema to lower extremities Skin:    General: Skin is warm and dry.     Capillary Refill: Capillary refill takes less than 2 seconds.  Neurological:     General: No focal deficit present.     Mental Status: He is alert.     Comments: 5/5 strength bilateral handgrip.  Cranial nerves II through XII grossly intact.  Wiggles toes bilaterally.    ED Results / Procedures / Treatments   Labs (all labs ordered are listed, but only abnormal results are displayed) Labs Reviewed  CBC WITH DIFFERENTIAL/PLATELET - Abnormal; Notable for the following components:      Result Value   RBC 2.55 (*)    Hemoglobin 8.5 (*)    HCT 26.4 (*)    MCV 103.5 (*)    Lymphs Abs 0.6 (*)    All other components within normal limits  COMPREHENSIVE METABOLIC PANEL - Abnormal; Notable for the following  components:   Sodium 132 (*)    Chloride 91 (*)    CO2 15 (*)    Glucose, Bld 148 (*)    Creatinine, Ser 1.74 (*)    Albumin 3.2 (*)    Total Bilirubin 1.8 (*)    GFR calc non Af Amer 43 (*)    GFR calc Af Amer 50 (*)    Anion gap 26 (*)    All other components within normal limits  LIPASE, BLOOD - Abnormal; Notable for the following components:   Lipase 174 (*)    All other components within normal limits  SARS CORONAVIRUS 2 (TAT 6-24 HRS)  URINALYSIS, ROUTINE W REFLEX MICROSCOPIC  SALICYLATE LEVEL  ACETAMINOPHEN LEVEL  BLOOD GAS, VENOUS  LACTIC ACID, PLASMA  LACTIC ACID, PLASMA  RAPID URINE DRUG SCREEN, HOSP PERFORMED  ETHANOL  TROPONIN I (HIGH SENSITIVITY)    EKG None  Radiology DG Abdomen Acute W/Chest  Result Date: 09/01/2019 CLINICAL DATA:  Emesis EXAM: DG ABDOMEN ACUTE W/ 1V CHEST COMPARISON:  None. FINDINGS: Limited exam secondary to poor penetration from patient body habitus. Relative paucity of bowel gas throughout the abdomen with scattered air in stool seen within the colon. There is no evidence of dilated bowel loops or free intraperitoneal air within the limitations of this exam. No radiopaque calculi or other significant radiographic abnormality is seen. Heart size and mediastinal contours are within normal limits. 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 IMPRESSION: Paucity of bowel gas throughout the abdomen with scattered air and stool within the colon. No evidence of bowel obstruction. Electronically Signed   By: Davina Poke D.O.   On: 09/01/2019 13:28    Procedures .Critical Care Performed by: Nettie Elm, PA-C Authorized by: Nettie Elm, PA-C   Critical care provider statement:    Critical care time (minutes):  35   Critical care was time spent personally by me on  the following activities:  Discussions with consultants, evaluation of patient's response to treatment, examination of patient, ordering and performing  treatments and interventions, ordering and review of laboratory studies, ordering and review of radiographic studies, pulse oximetry, re-evaluation of patient's condition, obtaining history from patient or surrogate and review of old charts   (including critical care time)  Medications Ordered in ED Medications  sodium chloride 0.9 % bolus 1,000 mL (has no administration in time range)  ondansetron (ZOFRAN) injection 4 mg (has no administration in time range)  morphine 4 MG/ML injection 4 mg (has no administration in time range)  sodium chloride 0.9 % bolus 1,000 mL (has no administration in time range)  LORazepam (ATIVAN) injection 0-4 mg (has no administration in time range)    Or  LORazepam (ATIVAN) tablet 0-4 mg (has no administration in time range)  LORazepam (ATIVAN) injection 0-4 mg (has no administration in time range)    Or  LORazepam (ATIVAN) tablet 0-4 mg (has no administration in time range)  thiamine tablet 100 mg (has no administration in time range)    Or  thiamine (B-1) injection 100 mg (has no administration in time range)    ED Course  I have reviewed the triage vital signs and the nursing notes.  Pertinent labs & imaging results that were available during my care of the patient were reviewed by me and considered in my medical decision making (see chart for details).  54 year old male presents for evaluation abdominal pain, emesis and inability to take care of self at home.  Apparently living with family however he is not able to perform his ADLs.  He is requesting SNF placement.  Multiple episodes of NBNB emesis.  No chest pain, shortness of breath.  Easily reducible umbilical hernia with generalized tenderness to his abdomen.  He has stage I early pressure wound to sacral region without any fluctuance or induration.  Overall appears deconditioned which I suspect slightly is due to patient's obesity.  Plan on labs, imaging and reassess.  Labs and imaging personally  reviewed and interpreted: CBC without leukocytosis, hemoglobin 8.5 at baseline compared to labs at [facility] , no prior to compare Metabolic panel with hyponatremia at 132, getting IV fluids, hyperglycemia 148, creatinine 1.74, bili 1.8, improved from baseline on prior labs, no additional LFT elevation, anion gap 26 Lipase 174 Plain film chest abdomen without acute findings, nonobstructive bowel gas pattern  Care transferred to [facility], [place] who will follow up on imaging.  Plan on pain control, p.o. intake and increase fluids.  Patient with acidosis, waiting on VBG, UDS, Tylenol, salicylate.  Does have prior history of anion gap acidosis on prior hospitalizations from [facility] however does not have any labs here to compare.  Rectal exam without any large stool ball or any stool in rectal vault.  Additional labs added due to anion gap of 16 to check for acidosis.  Has been given fluids, pain medicine and antiemetics.  He is pending CT scan.    Care transferred to [facility], [place] who will determine disposition.  Transition of care team has already been consulted due to patient requesting placement as he is not able to take care of himself at home  Alcohol abuse however denies prior history of withdrawal seizures, DTs.  CIWA orders placed.  Question of patient's elevated lipase is due to since her chronic pancreatitis.  Patient admits to drinking heavily due to depression over the past few weeks however denies any SI, HI, AVH.  Concern for  heart block on EKG. Will consult with Cardiology.  Cardiology consult pending on care transfer.      MDM Rules/Calculators/A&P                       Final Clinical Impression(s) / ED Diagnoses Final diagnoses:  Generalized abdominal pain  Nausea and vomiting, intractability of vomiting not specified, unspecified vomiting type  Elevated lipase    Rx / DC Orders ED Discharge Orders    None       Makaylyn Sinyard A, PA-C 09/01/19 1608    Isla Pence, MD 09/01/19 1612

## 2019-09-01 NOTE — ED Notes (Signed)
MD made aware that blood cultures were unable to be obtained.

## 2019-09-01 NOTE — ED Triage Notes (Addendum)
Pt from home with complaints of abdominal pain due to constipation X1 week and N/V X4 days Pt states he can not longer take care of himself. He can not walk, drive, make food for himself. Would like to be placed in SNF  128/94 HR 64 100% RA 125 CBG 97.2

## 2019-09-01 NOTE — Progress Notes (Signed)
Pharmacy Antibiotic Note  Allen Gould is a 54 y.o. male admitted on 09/01/2019 with cellulitis.  Pharmacy has been consulted for Vancomycin dosing.   Height: 5\' 10"  (177.8 cm) Weight: (!) 156.5 kg (345 lb) IBW/kg (Calculated) : 73  No data recorded.  Recent Labs  Lab 09/01/19 1308 09/01/19 1604  WBC 7.4  --   CREATININE 1.74*  --   LATICACIDVEN  --  1.5    Estimated Creatinine Clearance: 72.2 mL/min (A) (by C-G formula based on SCr of 1.74 mg/dL (H)).    Allergies  Allergen Reactions  . Shellfish Allergy Hives    Antimicrobials this admission: 4/10 Vancomycin >>   Dose adjustments this admission: N/a  Microbiology results: Pending   Plan:  - No loading dose indicated for cellulitis  - Start Vancomycin 1750mg  IV q24h - Est Calc AUC 491 - Monitor patients renal function and urine output  - De-escalate ABX when appropriate   Thank you for allowing pharmacy to be a part of this patient's care.  Duanne Limerick PharmD. BCPS 09/01/2019 7:07 PM

## 2019-09-01 NOTE — H&P (Addendum)
Date: 09/01/2019               Patient Name:  Allen Gould MRN: [id number]  DOB: February 15, 1966 Age / Sex: 54 y.o., male   PCP: Patient, No Pcp Per         Medical Service: Internal Medicine Teaching Service         Attending Physician: Dr. Darl Householder, Lujean Rave, MD    First Contact: Dr. Gillian Shields Pager: [phone]  Second Contact: Dr. Gilberto Better Pager: [phone]       After Hours (After 5p/  First Contact Pager: [phone]  weekends / holidays): Second Contact Pager: [phone]   Chief Complaint: Nausea, Vomiting  History of Present Illness:   Allen Gould is a 54 yo M w/ PMH of T2DM, Asthma, HTN, morbid obesity who presents for nausea and vomiting. He states that he has been having progressive decline in functioning with multitude of symptoms. He was in his usual state of health until 4 days ago when he began to have non-bilious non-bloody emesis without obvious inciting event. He mentions having emesis events every day 'too many' to count. He also mentions that has not had a bowel movement for 7 days. He denies any fever, chills, cough, dyspnea.  On review of systems, he recounts multiple chronic problems that has been ongoing. He mentions some epigastric burning pain 9/10 that is worsened by laying down, constant pinpoint left lower chest pain described as dull soreness accompanied with dyspnea on exertion. 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  Per chart review, he had an admission in 11/2018 for scrotal abscess and was also found to be in atrial fibrillation at the time. He was started on eliquis which was discontinued per [facility].   In the ED, he was found to have 3rd degree heart block on EKG and cardiology was consulted. He was given 2L bolus, zofran and IMTS was consulted for admission.  Meds:  Carvedilol 12.35m BID HCTZ 256mdaily Cyclobenzaprine 1039mTID Asa 325m8mily Amlodipine 10mg84mly Gabapentin 300mg 35m(ran out a while ago) Vitamin B12 1000mcg 74my Nystatin powder  Past Surgical History Appendectomy  Allergies: Allergies as of 09/01/2019 - Review Complete 09/01/2019  Allergen Reaction Noted  . Shellfish allergy Hives 08/10/2013   Past Medical History:  Diagnosis Date  . Asthma   . Diabetes mellitus without complication (HCC)   [place] (gastroes[place] reflux disease[place]   . Hypertension     Family History:  Both mother and father has diabetes, hypertension  Social History:  [place] Drinks alcohol 2-3 5th a day. Quit tobacco 10-15 years ago (At least 30 pack year smoking history) Smokes weed and cocaine but quit long times ago. Denies any other illicit substance use.  Review of Systems: A complete ROS was negative except as per HPI.  Physical Exam: Blood pressure 134/69, pulse (!) 58, resp. rate 16, height 5' 10"  (1.778 m), weight (!) 156.5 kg, SpO2 100 %.  Gen: Well-developed, Obese, NAD HEENT: NCAT head, hearing intact, EOMI, Icteric sclerae, MMM Neck: supple, ROM intact, no JVD, no cervical adenopathy CV: Bradycardic, irregular, S1, S2 normal Pulm: Distant breath sounds, no rales, no wheezes Abd: Hypoactive bowel sounds, Non-tender, Distended Extm: ROM intact, Peripheral pulses intact, No peripheral edema Skin: Jaundiced, Dry, Warm, erythematous scrotal folds with purulent drainage without  appreciable palpable abscess Neuro: AAOx3, No Asterixis  EKG: personally reviewed my interpretation is Mobitz type 2 heart block that is new, T wave inversions on anterior leads similar to prior EKG, no ST changes  CXR: clear lung fields bilaterally, no lobar consolidation, no pleural effusion, no pulmonary edema  Assessment & Plan by Problem: Active Problems:   * No active hospital problems. *  Allen Gould is a 54 yo M w/ PMH of T2DM, Asthma, PAF, HTN, morbid obesity who  presents for new heart block and intractable nausea and vomiting  New 2nd Degree Heart Block Appears to present with Mobitz 2 heart block. Cardiology consulted in ED. BP stable - Appreciate Cardiology assistance - Telemetry - AM EKG  Nausea, Vomiting 2/2 UTI vs Cellulitis vs Biliary Colic Present w/ non specific symptoms with malaise. Multiple possible causes. Describe urinary frequency and foul-smell which can suggest UTI, skin changes with tenderness concerning for cellulitis and significant gallstones with distended gall bladder with elevated bilirubin at 1.8. LFTs normal. Will perform additional work-up and empirically treat. - RUQ [place] - Trend CMP, CBC - F/u U/A - Start vancomycin, ceftriaxone - Zofran for nausea  Alcohol Use Disorder Endorse history of heavy alcohol use. Drinks 2-3 fifth of hard liquor a day. Denies prior history of alcohol withdrawal. Last drink 2 days prior. - CIWA protocol w/ Ativan - Thiamine, Folate - PPI  Acute Kidney Injury vs Progression of Chronic Kidney Disease Last known (2019) BUN 11, Creatinine 1.14. Presenting w/ BUN 14, [place] 1.74. Give 2L fluid bolus in ED - Trend renal fx - Avoid nephrotoxic meds  Macrocytic Anemia Presents with hgb of 8.5 and MCV of 103.5 in patient with alcohol use disorder. Likely due to folate/B12 deficiency - Trend cbc - Check B12 - C/w folate   Hepatic Lesion Incidental finding on CT. Need dedicated imaging to f/u - F/u outpatient for multiphase imaging  Pulmonary nodules Incidental finding on CT. Need repeat imaging in 6-12 months - F/u outpatient for non-contrast CT in 6-12 months  Dispo: Admit patient to Inpatient with expected length of stay greater than 2 midnights.  Signed: Mosetta Anis, MD 09/01/2019, 6:07 PM  Pager: [phone]

## 2019-09-02 ENCOUNTER — Inpatient Hospital Stay (HOSPITAL_COMMUNITY): Payer: Medicaid Other

## 2019-09-02 DIAGNOSIS — I491 Atrial premature depolarization: Secondary | ICD-10-CM

## 2019-09-02 DIAGNOSIS — I493 Ventricular premature depolarization: Secondary | ICD-10-CM

## 2019-09-02 DIAGNOSIS — L8990 Pressure ulcer of unspecified site, unspecified stage: Secondary | ICD-10-CM | POA: Insufficient documentation

## 2019-09-02 DIAGNOSIS — Z598 Other problems related to housing and economic circumstances: Secondary | ICD-10-CM

## 2019-09-02 DIAGNOSIS — K219 Gastro-esophageal reflux disease without esophagitis: Secondary | ICD-10-CM

## 2019-09-02 DIAGNOSIS — I452 Bifascicular block: Secondary | ICD-10-CM

## 2019-09-02 DIAGNOSIS — Z8679 Personal history of other diseases of the circulatory system: Secondary | ICD-10-CM

## 2019-09-02 DIAGNOSIS — I10 Essential (primary) hypertension: Secondary | ICD-10-CM

## 2019-09-02 DIAGNOSIS — I480 Paroxysmal atrial fibrillation: Secondary | ICD-10-CM

## 2019-09-02 DIAGNOSIS — I441 Atrioventricular block, second degree: Secondary | ICD-10-CM

## 2019-09-02 HISTORY — DX: Pressure ulcer of unspecified site, unspecified stage: L89.90

## 2019-09-02 LAB — CBC WITH DIFFERENTIAL/PLATELET
Abs Immature Granulocytes: 0.02 10*3/uL (ref 0.00–0.07)
Basophils Absolute: 0 10*3/uL (ref 0.0–0.1)
Basophils Relative: 1 %
Eosinophils Absolute: 0.1 10*3/uL (ref 0.0–0.5)
Eosinophils Relative: 2 %
HCT: 23.9 % — ABNORMAL LOW (ref 39.0–52.0)
Hemoglobin: 7.8 g/dL — ABNORMAL LOW (ref 13.0–17.0)
Immature Granulocytes: 1 %
Lymphocytes Relative: 21 %
Lymphs Abs: 0.8 10*3/uL (ref 0.7–4.0)
MCH: 33.8 pg (ref 26.0–34.0)
MCHC: 32.6 g/dL (ref 30.0–36.0)
MCV: 103.5 fL — ABNORMAL HIGH (ref 80.0–100.0)
Monocytes Absolute: 0.5 10*3/uL (ref 0.1–1.0)
Monocytes Relative: 12 %
Neutro Abs: 2.4 10*3/uL (ref 1.7–7.7)
Neutrophils Relative %: 63 %
Platelets: 207 10*3/uL (ref 150–400)
RBC: 2.31 MIL/uL — ABNORMAL LOW (ref 4.22–5.81)
RDW: 14.8 % (ref 11.5–15.5)
WBC: 3.8 10*3/uL — ABNORMAL LOW (ref 4.0–10.5)
nRBC: 0 % (ref 0.0–0.2)

## 2019-09-02 LAB — COMPREHENSIVE METABOLIC PANEL
ALT: 13 U/L (ref 0–44)
AST: 27 U/L (ref 15–41)
Albumin: 2.7 g/dL — ABNORMAL LOW (ref 3.5–5.0)
Alkaline Phosphatase: 66 U/L (ref 38–126)
Anion gap: 17 — ABNORMAL HIGH (ref 5–15)
BUN: 14 mg/dL (ref 6–20)
CO2: 23 mmol/L (ref 22–32)
Calcium: 8.9 mg/dL (ref 8.9–10.3)
Chloride: 95 mmol/L — ABNORMAL LOW (ref 98–111)
Creatinine, Ser: 1.26 mg/dL — ABNORMAL HIGH (ref 0.61–1.24)
GFR calc Af Amer: >60 mL/min (ref >60)
GFR calc non Af Amer: >60 mL/min (ref >60)
Glucose, Bld: 121 mg/dL — ABNORMAL HIGH (ref 70–99)
Potassium: 3.3 mmol/L — ABNORMAL LOW (ref 3.5–5.1)
Sodium: 135 mmol/L (ref 135–145)
Total Bilirubin: 1.5 mg/dL — ABNORMAL HIGH (ref 0.3–1.2)
Total Protein: 7 g/dL (ref 6.5–8.1)

## 2019-09-02 LAB — PROTIME-INR
INR: 1.1 (ref 0.8–1.2)
Prothrombin Time: 14.5 seconds (ref 11.4–15.2)

## 2019-09-02 LAB — TSH: TSH: 4.742 u[IU]/mL — ABNORMAL HIGH (ref 0.350–4.500)

## 2019-09-02 LAB — HEPATITIS C ANTIBODY: HCV Ab: NONREACTIVE

## 2019-09-02 LAB — VITAMIN B12: Vitamin B-12: 2055 pg/mL — ABNORMAL HIGH (ref 180–914)

## 2019-09-02 LAB — SARS CORONAVIRUS 2 (TAT 6-24 HRS): SARS Coronavirus 2: NEGATIVE

## 2019-09-02 LAB — HIV ANTIBODY (ROUTINE TESTING W REFLEX): HIV Screen 4th Generation wRfx: NONREACTIVE

## 2019-09-02 MED ORDER — POTASSIUM CHLORIDE CRYS ER 20 MEQ PO TBCR
40.0000 meq | EXTENDED_RELEASE_TABLET | Freq: Once | ORAL | Status: AC
  Administered 2019-09-02: 40 meq via ORAL
  Filled 2019-09-02: qty 2

## 2019-09-02 NOTE — Progress Notes (Signed)
Subjective:  [name] was examined and evaluated at bedside this am. He was observed resting comfortably in bed, drinking coffee. He states he feels better this morning with less nausea, vomiting and abdominal pain. He has not yet had a bowel movement. Discussed lab and imaging findings from yesterday. He states he knows that he has fatty liver and was told he needed a biopsy in the past. When discussing his alcohol use, he mentions that he has been feeling depressed due to his unstable housing situation and has been increasing his alcohol intake as coping mechanism. Discussed plan to continue antibiotics and treat his symptoms.  Objective:    Vital Signs (last 24 hours): Vitals:   09/01/19 2015 09/01/19 2052 09/02/19 0221 09/02/19 0528  BP: 114/72 132/64 117/62 122/64  Pulse:  (!) 46 (!) 45 (!) 53  Resp: 17 20 18 18   Temp:  98.1 F (36.7 C) 97.6 F (36.4 C) 97.7 F (36.5 C)  TempSrc:  Oral Oral Oral  SpO2:  92% 100% 100%  Weight:      Height:        Physical Exam: General Alert and answers questions appropriately, no acute distress  Cardiac Regular rate and rhythm, no murmurs, rubs, or gallops  Pulmonary Clear to auscultation bilaterally without wheezes, rhonchi, or rales  Abdominal Soft, non-tender, without distention  Skin Erythema present inguinal region, improved since yesterday. No purulence noted  Extremities No peripheral edema    CMP Latest Ref Rng & Units 09/02/2019 09/01/2019 09/01/2019  Glucose 70 - 99 mg/dL 121(H) - 148(H)  BUN 6 - 20 mg/dL 14 - 14  Creatinine 0.61 - 1.24 mg/dL 1.26(H) - 1.74(H)  Sodium 135 - 145 mmol/L 135 131(L) 132(L)  Potassium 3.5 - 5.1 mmol/L 3.3(L) 3.7 3.7  Chloride 98 - 111 mmol/L 95(L) - 91(L)  CO2 22 - 32 mmol/L 23 - 15(L)  Calcium 8.9 - 10.3 mg/dL 8.9 - 9.3  Total Protein 6.5 - 8.1 g/dL 7.0 - 7.8  Total Bilirubin 0.3 - 1.2 mg/dL 1.5(H) - 1.8(H)  Alkaline Phos 38 - 126 U/L 66 - 72  AST 15 - 41 U/L 27 - 25  ALT 0 - 44 U/L 13 - 15    CBC Latest Ref Rng & Units 09/02/2019 09/01/2019 09/01/2019  WBC 4.0 - 10.5 K/uL 3.8(L) - 7.4  Hemoglobin 13.0 - 17.0 g/dL 7.8(L) 9.5(L) 8.5(L)  Hematocrit 39.0 - 52.0 % 23.9(L) 28.0(L) 26.4(L)  Platelets 150 - 400 K/uL 207 - 241   Urinalysis    Component Value Date/Time   COLORURINE AMBER (A) 09/01/2019 1850   APPEARANCEUR CLEAR 09/01/2019 1850   LABSPEC 1.016 09/01/2019 1850   PHURINE 5.0 09/01/2019 1850   GLUCOSEU NEGATIVE 09/01/2019 1850   HGBUR NEGATIVE 09/01/2019 1850   BILIRUBINUR NEGATIVE 09/01/2019 1850   KETONESUR 80 (A) 09/01/2019 1850   PROTEINUR 100 (A) 09/01/2019 1850   UROBILINOGEN 0.2 08/10/2013 1240   NITRITE NEGATIVE 09/01/2019 1850   LEUKOCYTESUR TRACE (A) 09/01/2019 1850   PT: 14.5 INR: 1.1  US Abdomen Complete (09/02/19): IMPRESSION: 1. Limited study secondary to habitus. 2. Possible small amount of gallbladder sludge. No other features to suggest acute cholecystitis 3. Echogenic liver consistent with steatosis. Very limited evaluation for focal parenchymal abnormality at the liver.  CT Renal Stone Study (09/01/19): IMPRESSION: 1. No nephrolithiasis or hydronephrosis. No findings to explain hematuria on noncontrast imaging. Bilateral perinephric stranding could be seen in the setting of urinary tract infection but is nonspecific. Consider follow-up imaging utilizing hematuria  protocol on follow-up when appropriate. 2. Marked hepatic steatosis with nodular morphology. Lesion in the posterior right hemi liver measures 2 x 2.3 cm less dense than surrounding fatty changes. This is of uncertain significance but the patient appears to have findings of liver disease and potentially early cirrhosis. Would suggest follow-up multiphase imaging of the liver on an outpatient basis to further assess for underlying liver lesions. In the meantime correlation with clinical and laboratory studies to assess for clinical signs of liver disease is suggested. 3.  Gallbladder distension without pericholecystic stranding likely with extensive sludge and/or stones in the gallbladder. 4. Suggestion of mural stratification of the ascending colon. This could be due to underdistention, mild colitis or more likely prior colitis. 5. 8 mm pulmonary nodule in the left lung base. Non-contrast chest CT at 6-12 months is recommended. If the nodule is stable at time of repeat CT, then future CT at 18-24 months (from today's scan) is considered optional for low-risk patients, but is recommended for high-risk patients. This recommendation follows the consensus statement: Guidelines for Management of Incidental Pulmonary Nodules Detected on CT Images: From the Fleischner Society 2017; Radiology 2017; 284:228-243. 6. Fat containing umbilical hernia. 7. Aortic atherosclerosis.   Assessment/Plan:   Active Problems:   Acute kidney injury [facility])  Patient is a 54 year old male with past medical history significant for type 2 diabetes mellitus, asthma, PAF, hypertension, morbid obesity who presents for new heart block and intractable nausea, vomiting.  # Nausea, vomiting 2/2 biliary colic vs. UTI vs. Cellulitis vs. constipation:  CT with extensive sludge and/or gallstones on CT, [place] without features of cholecystitis. Skin changes with purulence suggestive of cellulitis in groin. UA with trace leukocytes, rare bacteria. Patient remains afebrile, WBC without elevation. *Continue vancomycin+ceftriaxone *Lactulose 20 g twice daily + Miralax PRN *Zofran PRN for nausea *Full liquid diet, advance as tolerated  # 2nd degree AV block: Evaluated by cardiology. Type 1 (Wenckebach), asymptomatic, no indication for pacemaker.  # History of A. Fib: Patient with history of A. Fib and prior EKGs from 2019 that could be consistent with A. Fib with 2:1 conduction. Previously on anticoagulation. Could consider DOAC on discharge per cardiology. 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 Denies prior history of alcohol withdrawal, states last drink 2 days prior. CT abd/pelvis with evidence for nodularity and patient with history of NAFLD *CIWA with ativan - max score 11 overnight, received ativan x2 (35m, 220m *Thiamine, folate  # GERD: Patient with symptoms consistent with GERD *Continue famotidine  # AKI vs CKD: Creatinine 1.74 on admission. Last creatinine in 2019 of 1.14. Patient given 2 L NS bolus in ER, placed on LR maintenance fluids. Creatinine this AM of 1.26 *Avoid nephrotoxic meds  # Macrocytic anemia: Hemoglobin of 8.5 on admission. 7.8 this AM, likely dilutional *Check B12 *Continue with folate supplementation  Hepatic Lesion Incidental finding on CT. Need dedicated imaging to f/u - F/u outpatient for multiphase imaging  Pulmonary nodules Incidental finding on CT. Need repeat imaging in 6-12 months - F/u outpatient for non-contrast CT in 6-12 months  PT/OT: Consulted Diet: Full liquid, advance as tolerated DVT Ppx: Lovenox 75 mg daily Admit Status: Inpatient Dispo: Anticipated discharge pending clinical improvement  MaJeanmarie HubertMD 09/02/2019, 5:49 AM

## 2019-09-02 NOTE — Progress Notes (Addendum)
Progress Note  Patient Name: Allen Gould Date of Encounter: 09/02/2019  Primary Cardiologist: No primary care provider on file.   Subjective   Denies any chest pain, dizziness, palpitations or SOB.  Tele shows frequent PVCs and PACs.  Difficult to assess heart block due to frequent ectopy but does have periods where PR interval appears to lengthen and then drop a beat  Inpatient Medications    Scheduled Meds: . aspirin  325 mg Oral Daily  . enoxaparin (LOVENOX) injection  75 mg Subcutaneous Q24H  . famotidine  10 mg Oral BID  . gabapentin  300 mg Oral TID  . lactulose  20 g Oral BID  . LORazepam  0-4 mg Intravenous Q6H   Or  . LORazepam  0-4 mg Oral Q6H  . [START ON 09/03/2019] LORazepam  0-4 mg Intravenous Q12H   Or  . [START ON 09/03/2019] LORazepam  0-4 mg Oral Q12H  . sodium chloride flush  3 mL Intravenous Q12H  . thiamine  100 mg Oral Daily   Or  . thiamine  100 mg Intravenous Daily  . vitamin B-12  1,000 mcg Oral Daily   Continuous Infusions: . cefTRIAXone (ROCEPHIN)  IV Stopped (09/01/19 1957)  . vancomycin Stopped (09/01/19 2252)   PRN Meds: acetaminophen **OR** acetaminophen, ondansetron **OR** ondansetron (ZOFRAN) IV, polyethylene glycol   Vital Signs    Vitals:   09/01/19 2015 09/01/19 2052 09/02/19 0221 09/02/19 0528  BP: 114/72 132/64 117/62 122/64  Pulse:  (!) 46 (!) 45 (!) 53  Resp: 17 20 18 18   Temp:  98.1 F (36.7 C) 97.6 F (36.4 C) 97.7 F (36.5 C)  TempSrc:  Oral Oral Oral  SpO2:  92% 100% 100%  Weight:      Height:        Intake/Output Summary (Last 24 hours) at 09/02/2019 1211 Last data filed at 09/02/2019 0528 Gross per 24 hour  Intake 1356.99 ml  Output 550 ml  Net 806.99 ml   Filed Weights   09/01/19 1151  Weight: (!) 156.5 kg    Telemetry    NSR with frequent PVCs, PACs and episodic dropped beats 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   Neck: No JVD Cardiac: RRR, no murmurs, rubs, or gallops.  Respiratory: Clear to auscultation bilaterally. GI: Soft, nontender, non-distended  MS: No edema; No deformity. Neuro:  Nonfocal  Psych: Normal affect   Labs    Chemistry Recent Labs  Lab 09/01/19 1308 09/01/19 1613 09/02/19 0434  NA 132* 131* 135  K 3.7 3.7 3.3*  CL 91*  --  95*  CO2 15*  --  23  GLUCOSE 148*  --  121*  BUN 14  --  14  CREATININE 1.74*  --  1.26*  CALCIUM 9.3  --  8.9  PROT 7.8  --  7.0  ALBUMIN 3.2*  --  2.7*  AST 25  --  27  ALT 15  --  13  ALKPHOS 72  --  66  BILITOT 1.8*  --  1.5*  GFRNONAA 43*  --  >60  GFRAA 50*  --  >60  ANIONGAP 26*  --  17*     Hematology Recent Labs  Lab 09/01/19 1308 09/01/19 1613 09/02/19 0434  WBC 7.4  --  3.8*  RBC  2.55*  --  2.31*  HGB 8.5* 9.5* 7.8*  HCT 26.4* 28.0* 23.9*  MCV 103.5*  --  103.5*  MCH 33.3  --  33.8  MCHC 32.2  --  32.6  RDW 14.6  --  14.8  PLT 241  --  207    Cardiac EnzymesNo results for input(s): TROPONINI in the last 168 hours. No results for input(s): TROPIPOC in the last 168 hours.   BNPNo results for input(s): BNP, PROBNP in the last 168 hours.   DDimer No results for input(s): DDIMER in the last 168 hours.   Radiology    US Abdomen Complete  Result Date: 09/02/2019 CLINICAL DATA:  Biliary colic EXAM: ABDOMEN ULTRASOUND COMPLETE COMPARISON:  CT 09/01/2019 FINDINGS: Gallbladder: Possible small amount of sludge in the gallbladder. No shadowing stones. No wall thickening. Negative sonographic Murphy. Common bile duct: Diameter: 4.8 mm Liver: Liver is echogenic and poorly penetrated. This limits evaluation for focal abnormality. Portal vein is patent on color Doppler imaging with normal direction of blood flow towards the liver. IVC: No abnormality visualized. Pancreas: Not well visualized. Spleen: Size and appearance within normal  limits. Right Kidney: Length: 12.5 cm. Echogenicity within normal limits. No mass or hydronephrosis visualized. Left Kidney: Length: 12.1 cm. Echogenicity within normal limits. No mass or hydronephrosis visualized. Abdominal aorta: Maximum diameter of 2.7 cm. Distal aorta and bifurcation not well seen. Other findings: None. IMPRESSION: 1. Limited study secondary to habitus. 2. Possible small amount of gallbladder sludge. No other features to suggest acute cholecystitis 3. Echogenic liver consistent with steatosis. Very limited evaluation for focal parenchymal abnormality at the liver. Electronically Signed   By: Donavan Foil M.D.   On: 09/02/2019 03:52   DG Abdomen Acute W/Chest  Result Date: 09/01/2019 CLINICAL DATA:  Emesis EXAM: DG ABDOMEN ACUTE W/ 1V CHEST COMPARISON:  None. FINDINGS: Limited exam secondary to poor penetration from patient body habitus. Relative paucity of bowel gas throughout the abdomen with scattered air in stool seen within the colon. There is no evidence of dilated bowel loops or free intraperitoneal air within the limitations of this exam. No radiopaque calculi or other significant radiographic abnormality is seen. Heart size and mediastinal contours are within normal limits. 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 IMPRESSION: Paucity of bowel gas throughout the abdomen with scattered air and stool within the colon. No evidence of bowel obstruction. Electronically Signed   By: Davina Poke D.O.   On: 09/01/2019 13:28   CT Renal Stone Study  Result Date: 09/01/2019 CLINICAL DATA:  Hematuria unknown cause EXAM: CT ABDOMEN AND PELVIS WITHOUT CONTRAST TECHNIQUE: Multidetector CT imaging of the abdomen and pelvis was performed following the standard protocol without IV contrast. COMPARISON:  None FINDINGS: Lower chest: Nodule in the left lung base 8 mm. Calcified coronary artery disease. Normal heart size. No pericardial effusion. Hepatobiliary: Marked hepatic  steatosis with nodular morphology. Lesion in the posterior right hemi liver measures 2 x 2.3 cm less dense than surrounding fatty changes. Abundant gallstones in the gallbladder. No pericholecystic stranding. Gallbladder distension is moderate. No gross biliary ductal dilation. Pancreas: Pancreas is normal without focal lesion or ductal dilation. No peripancreatic inflammation. Spleen: Spleen is normal size without focal lesion. Adrenals/Urinary Tract: Adrenal glands are normal. No nephrolithiasis or hydronephrosis. Bilateral perinephric stranding without focal fluid on noncontrast imaging. Stomach/Bowel: Small hiatal hernia. Small bowel is normal caliber. Post appendectomy. Suggestion of mural stratification of the ascending colon. Colon is under distended limiting assessment. Stool throughout  the remainder of the colon. Vascular/Lymphatic: Scattered atherosclerosis of the abdominal aorta. No adenopathy in the upper abdomen or in the retroperitoneum. No pelvic lymphadenopathy. Reproductive: Prostate unremarkable by CT. Other: No free air. No ascites. Fat containing umbilical hernia. Musculoskeletal: Spinal degenerative changes. Degenerative changes in the bilateral hips. No acute bone finding or destructive bone process. IMPRESSION: 1. No nephrolithiasis or hydronephrosis. No findings to explain hematuria on noncontrast imaging. Bilateral perinephric stranding could be seen in the setting of urinary tract infection but is nonspecific. Consider follow-up imaging utilizing hematuria protocol on follow-up when appropriate. 2. Marked hepatic steatosis with nodular morphology. Lesion in the posterior right hemi liver measures 2 x 2.3 cm less dense than surrounding fatty changes. This is of uncertain significance but the patient appears to have findings of liver disease and potentially early cirrhosis. Would suggest follow-up multiphase imaging of the liver on an outpatient basis to further assess for underlying liver  lesions. In the meantime correlation with clinical and laboratory studies to assess for clinical signs of liver disease is suggested. 3. Gallbladder distension without pericholecystic stranding likely with extensive sludge and/or stones in the gallbladder. 4. Suggestion of mural stratification of the ascending colon. This could be due to underdistention, mild colitis or more likely prior colitis. 5. 8 mm pulmonary nodule in the left lung base. Non-contrast chest CT at 6-12 months is recommended. If the nodule is stable at time of repeat CT, then future CT at 18-24 months (from today's scan) is considered optional for low-risk patients, but is recommended for high-risk patients. This recommendation follows the consensus statement: Guidelines for Management of Incidental Pulmonary Nodules Detected on CT Images: From the Fleischner Society 2017; Radiology 2017; 284:228-243. 6. Fat containing umbilical hernia. 7. Aortic atherosclerosis. These results were called by telephone at the time of interpretation on 09/01/2019 at 5:23 pm to provider Nuala Alpha , who verbally acknowledged these results. Aortic Atherosclerosis (ICD10-I70.0). Electronically Signed   By: Zetta Bills M.D.   On: 09/01/2019 17:20    Cardiac Studies   05/2017 2D echo Study Conclusions   - Left ventricle: The cavity size was normal. There was moderate  concentric hypertrophy. Systolic function was normal. The  estimated ejection fraction was in the range of 55% to 60%. Wall  motion was normal; there were no regional wall motion  abnormalities. There was an increased relative contribution of  atrial contraction to ventricular filling. Doppler parameters are  consistent with abnormal left ventricular relaxation (grade 1  diastolic dysfunction).  - Aortic valve: Trileaflet; mildly thickened, mildly calcified  leaflets. Valve area (VTI): 2.19 cm^2. Valve area (Vmax): 2.07  cm^2. Valve area (Vmean): 2.29 cm^2.  -  Aorta: Aortic root dimension: 39 mm (ED).  - Aortic root: The aortic root was mildly dilated.  - Mitral valve: Moderately calcified annulus. There was mild  regurgitation.  - Pulmonary arteries: Systolic pressure could not be accurately  estimated.   Patient Profile     54 y.o. male with a history of AF, chronic RBBB/LAFB, morbid obesity, alcohol use.  Presented to ED today with multiple complaints.  N/V x 4 days, epigastric pain 9/10, neuorpathic pain in feet, scrotal rash with pain and foul smell, dysuria.  Drinking 2-3 fifths ETOH per day.  In ED ECG showed 2nd degree AVB, so cardiology consulted.  Patient to be admitted to medicine for cellulitis and N/V.  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  HR 50s.  Most recent ECGs  here 2019 show likely AFL with RBBB/LAFB with HR in 110s. Had admission in 11/2018 at [facility] for scrotal abscess.  Per notes was in AF RVR on admission. Has been on apixaban and rivaroxaban in the past.  Assessment & Plan    1.  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 -he also has bifascicular block with RBBB and LAFB but no evidence of tele of any higher degree AVB -he is asymptomatic with no significant bradycardia or pauses -no indication for PPM at this time -will continue to monitor on tele  2.  Chronic RBBB/LAFB -denies any syncope, dizziness or weakness  3.  Hx of PAF/atrial flutter -CHADS2VASC score of 2 (HTN/DM) and has been on apixaban and Xarelto in the past but not on DOAC on presentation -currently on full dose Lovenox -consider reinitiation at discharge   4.  HTN -BP controlled -was on amlodipine/ACE I and BB prior to admit which have been stopped  I have spent a total of 35 minutes with patient reviewing consult notes, hospital notes , telemetry, EKGs, labs and examining patient as well as establishing an assessment and plan  that was discussed with the patient.  > 50% of time was spent in direct patient care.        For questions or updates, please contact [place] Please consult [email or web address] for contact info under Cardiology/STEMI.      Signed, Fransico Him, MD  09/02/2019, 12:11 PM

## 2019-09-02 NOTE — Plan of Care (Signed)
  Problem: Coping: Goal: Level of anxiety will decrease Outcome: Progressing   Problem: Pain Managment: Goal: General experience of comfort will improve Outcome: Progressing   Problem: Safety: Goal: Ability to remain free from injury will improve Outcome: Progressing   

## 2019-09-03 ENCOUNTER — Encounter (HOSPITAL_COMMUNITY): Payer: Self-pay | Admitting: Internal Medicine

## 2019-09-03 DIAGNOSIS — E1140 Type 2 diabetes mellitus with diabetic neuropathy, unspecified: Secondary | ICD-10-CM

## 2019-09-03 DIAGNOSIS — R001 Bradycardia, unspecified: Secondary | ICD-10-CM

## 2019-09-03 DIAGNOSIS — Z7984 Long term (current) use of oral hypoglycemic drugs: Secondary | ICD-10-CM

## 2019-09-03 DIAGNOSIS — L03314 Cellulitis of groin: Secondary | ICD-10-CM

## 2019-09-03 DIAGNOSIS — Z7289 Other problems related to lifestyle: Secondary | ICD-10-CM

## 2019-09-03 LAB — COMPREHENSIVE METABOLIC PANEL
ALT: 11 U/L (ref 0–44)
AST: 21 U/L (ref 15–41)
Albumin: 2.7 g/dL — ABNORMAL LOW (ref 3.5–5.0)
Alkaline Phosphatase: 63 U/L (ref 38–126)
Anion gap: 14 (ref 5–15)
BUN: 12 mg/dL (ref 6–20)
CO2: 23 mmol/L (ref 22–32)
Calcium: 8.8 mg/dL — ABNORMAL LOW (ref 8.9–10.3)
Chloride: 96 mmol/L — ABNORMAL LOW (ref 98–111)
Creatinine, Ser: 1.06 mg/dL (ref 0.61–1.24)
GFR calc Af Amer: >60 mL/min (ref >60)
GFR calc non Af Amer: >60 mL/min (ref >60)
Glucose, Bld: 157 mg/dL — ABNORMAL HIGH (ref 70–99)
Potassium: 3.9 mmol/L (ref 3.5–5.1)
Sodium: 133 mmol/L — ABNORMAL LOW (ref 135–145)
Total Bilirubin: 0.8 mg/dL (ref 0.3–1.2)
Total Protein: 6.8 g/dL (ref 6.5–8.1)

## 2019-09-03 LAB — URINE CULTURE: Culture: 40000 — AB

## 2019-09-03 LAB — CBC
HCT: 22.5 % — ABNORMAL LOW (ref 39.0–52.0)
Hemoglobin: 7.3 g/dL — ABNORMAL LOW (ref 13.0–17.0)
MCH: 33 pg (ref 26.0–34.0)
MCHC: 32.4 g/dL (ref 30.0–36.0)
MCV: 101.8 fL — ABNORMAL HIGH (ref 80.0–100.0)
Platelets: 199 10*3/uL (ref 150–400)
RBC: 2.21 MIL/uL — ABNORMAL LOW (ref 4.22–5.81)
RDW: 14.5 % (ref 11.5–15.5)
WBC: 4.4 10*3/uL (ref 4.0–10.5)
nRBC: 0 % (ref 0.0–0.2)

## 2019-09-03 LAB — HEMOGLOBIN A1C
Hgb A1c MFr Bld: 5.7 % — ABNORMAL HIGH (ref 4.8–5.6)
Mean Plasma Glucose: 117 mg/dL

## 2019-09-03 MED ORDER — NYSTATIN 100000 UNIT/GM EX POWD
1.0000 "application " | Freq: Two times a day (BID) | CUTANEOUS | Status: DC
  Administered 2019-09-03 – 2019-09-05 (×5): 1 via TOPICAL
  Filled 2019-09-03: qty 15

## 2019-09-03 NOTE — Social Work (Signed)
[name] acknowledging consult for SNF placement. Will follow for therapy recommendations needed to best determine disposition/for insurance authorization.   Westley Hummer, MSW, [place] [facility]

## 2019-09-03 NOTE — Evaluation (Signed)
Occupational Therapy Evaluation Patient Details Name: Allen Gould MRN: [id number] DOB: 1966-04-27 Today's Date: 09/03/2019    History of Present Illness Pt is a 54 y/o male with PMH of T2DM, asthma, HTN, morbid obesity, alcohol use who presents for nausea and vomiting x 4 days, epigastric pain, neuropathic pain in feet, scrotal rash and dysuria. ECG in ED revealed 2nd degree AV block.    Clinical Impression   Patient admitted for above and limited by problem list below, including generalized weakness, decreased activity tolerance, impaired balance, and pain. Pt reports PTA difficulty with mobility and spending 23.5 hours a day in his recliner, transferring to [facility] when necessary, having an aide assist for 1hr/day for bathing/dressing assist and difficulty getting meals.  Patient currently requires mod assist for bed mobility, mod assist +2 for stand pivot transfers to recliner, min-total assist +2 for ADLs.  He will benefit from further OT services while admitted and after dc at SNF level to optimize independence and safety with ADLs ,mobility prior to dc home.  IF pt goes home, he will need maximal HH services, including increased aide hours and possibly meals on wheels. Will follow acutely.     Follow Up Recommendations  SNF;Home health OT;Other (comment);Supervision/Assistance - 24 hour(if SNF declines, max HH services with increased aide hours )    Equipment Recommendations  None recommended by OT    Recommendations for Other Services       Precautions / Restrictions Precautions Precautions: Fall Restrictions Weight Bearing Restrictions: No      Mobility Bed Mobility Overal bed mobility: Needs Assistance Bed Mobility: Supine to Sit     Supine to sit: Mod assist;HOB elevated     General bed mobility comments: requires increased time and effort, able to manage LES but requires support to scoot hips and elevate trunk; cueing for technique with heavy reliance on bed rail (pt  normally sleeps in a recliner)  Transfers Overall transfer level: Needs assistance Equipment used: Rolling walker (2 wheeled) Transfers: Sit to/from [facility] Sit to Stand: From elevated surface;+2 physical assistance;Min assist Stand pivot transfers: Mod assist;+2 physical assistance       General transfer comment: pt needed bed elevated significantly, use of momentum for sit<>stand, min A +2 for stability. Pt had great difficulty stepping feet to chair, R more than L, needed mod support +2 for safety with use of RW. Pt's LE's trembling after 2 mins standing    Balance Overall balance assessment: Needs assistance Sitting-balance support: No upper extremity supported;Feet supported Sitting balance-Leahy Scale: Good     Standing balance support: Bilateral upper extremity supported;During functional activity Standing balance-Leahy Scale: Poor Standing balance comment: heavily reliant on UE support                           ADL either performed or assessed with clinical judgement   ADL Overall ADL's : Needs assistance/impaired     Grooming: Set up;Sitting   Upper Body Bathing: Sitting;Minimal assistance   Lower Body Bathing: Maximal assistance;+2 for physical assistance;+2 for safety/equipment;Sit to/from stand   Upper Body Dressing : Minimal assistance;Sitting   Lower Body Dressing: Total assistance;+2 for physical assistance;+2 for safety/equipment;Sit to/from stand   Toilet Transfer: Moderate assistance;+2 for physical assistance;+2 for safety/equipment;Stand-pivot;RW Toilet Transfer Details (indicate cue type and reason): simulated to recliner          Functional mobility during ADLs: Moderate assistance;+2 for physical assistance;+2 for safety/equipment;Rolling walker General ADL Comments:  pt limited by weakness, impaired balance, decreased activity tolerance     Vision Patient Visual Report: No change from baseline Additional  Comments: pt reports needing glasses     Perception     Praxis      Pertinent Vitals/Pain Pain Assessment: Faces Faces Pain Scale: Hurts even more Pain Location: B feet  Pain Descriptors / Indicators: Burning;Aching;Constant Pain Intervention(s): Limited activity within patient's tolerance;Monitored during session;Repositioned     Hand Dominance Right   Extremity/Trunk Assessment Upper Extremity Assessment Upper Extremity Assessment: Generalized weakness   Lower Extremity Assessment Lower Extremity Assessment: Defer to PT evaluation RLE Deficits / Details: hip flex 2-/5, knee ext 3-/5, knee flex 3/5 RLE Sensation: history of peripheral neuropathy RLE Coordination: decreased gross motor LLE Deficits / Details: hip flex 2-/5, knee ext 3-/5, knee flex 3/5 LLE Sensation: history of peripheral neuropathy LLE Coordination: decreased gross motor   Cervical / Trunk Assessment Cervical / Trunk Assessment: Other exceptions Cervical / Trunk Exceptions: posture affected by body habitus   Communication Communication Communication: No difficulties   Cognition Arousal/Alertness: Awake/alert Behavior During Therapy: Flat affect Overall Cognitive Status: No family/caregiver present to determine baseline cognitive functioning                                 General Comments: seems depressed of mood and at times angry, relays that he needs more support and does not have it. Cognition appears [facility] but emotional state limiting him significantly   General Comments  SpO2 92% on RA    Exercises Exercises: General Lower Extremity General Exercises - Lower Extremity Ankle Circles/Pumps: AROM;Both;10 reps;Seated   Shoulder Instructions      Home Living Family/patient expects to be discharged to:: Private residence Living Arrangements: Other relatives;Non-relatives/Friends Available Help at Discharge: Personal care attendant;Available PRN/intermittently Type of Home:  Apartment Home Access: Stairs to enter Entrance Stairs-Number of Steps: 3 Entrance Stairs-Rails: None Home Layout: One level     Bathroom Shower/Tub: Teacher, early years/pre: Standard     Home Equipment: Environmental consultant - 2 wheels;Bedside commode;Grab bars - tub/shower;Other (comment)(lift chair)   Additional Comments: lives with sister and her friend, they do not help him. Has Aide for 1 hour/day, helps with bathing only; normally sleeps in recliner       Prior Functioning/Environment Level of Independence: Needs assistance  Gait / Transfers Assistance Needed: uses walker as needed, minimal mobility and reports using 'furniture" for a few steps to commode' sleeps in recliner  ADL's / Homemaking Assistance Needed: assist from aide for bathing/dressing, uses "cup" as urinal and if has to get to restroom can with assist    Comments: pt reports his mobility has been poor past 2 years. Was living in [place] and getting [facility] services. Wife died in 07/16/22 and he reports landlord kicked him out of home during her funeral. Has been staying with his sister in [place] since that time.         OT Problem List: Decreased strength;Decreased activity tolerance;Impaired balance (sitting and/or standing);Pain;Decreased safety awareness;Decreased knowledge of use of DME or AE;Decreased knowledge of precautions;Cardiopulmonary status limiting activity;Obesity      OT Treatment/Interventions: Self-care/ADL training;Energy conservation;DME and/or AE instruction;Therapeutic activities;Patient/family education;Balance training;Therapeutic exercise    OT Goals(Current goals can be found in the care plan section) Acute Rehab OT Goals Patient Stated Goal: go to rehab OT Goal Formulation: With patient Time For Goal Achievement: 09/17/19 Potential to  Achieve Goals: Fair  OT Frequency: Min 2X/week   Barriers to D/C:            Co-evaluation PT/OT/SLP Co-Evaluation/Treatment: Yes Reason for  Co-Treatment: Necessary to address cognition/behavior during functional activity;For patient/therapist safety;To address functional/ADL transfers PT goals addressed during session: Mobility/safety with mobility;Balance;Proper use of DME;Strengthening/ROM OT goals addressed during session: ADL's and self-care      AM-PAC OT "6 Clicks" Daily Activity     Outcome Measure Help from another person eating meals?: None Help from another person taking care of personal grooming?: A Little Help from another person toileting, which includes using toliet, bedpan, or urinal?: A Lot Help from another person bathing (including washing, rinsing, drying)?: A Lot Help from another person to put on and taking off regular upper body clothing?: A Little Help from another person to put on and taking off regular lower body clothing?: Total 6 Click Score: 15   End of Session Equipment Utilized During Treatment: Rolling walker;Gait belt Nurse Communication: Mobility status  Activity Tolerance: Patient tolerated treatment well Patient left: in chair;with call bell/phone within reach  OT Visit Diagnosis: Other abnormalities of gait and mobility (R26.89);Muscle weakness (generalized) (M62.81);Pain Pain - Right/Left: (bil) Pain - part of body: Ankle and joints of foot                Time: 0930-1006 OT Time Calculation (min): 36 min Charges:  OT General Charges $OT Visit: 1 Visit OT Evaluation $OT Eval Moderate Complexity: 1 Mod  Jolaine Artist, OT Acute Rehabilitation Services Pager [phone] Office [phone]   Delight Stare 09/03/2019, 12:19 PM

## 2019-09-03 NOTE — TOC Initial Note (Signed)
Transition of Care [facility]) - Initial/Assessment Note    Patient Details  Name: Allen Gould MRN: [id number] Date of Birth: Nov 10, 1965  Transition of Care [facility]) CM/SW Contact:    Alexander Mt, LCSW Phone Number:  09/03/2019, 10:42 AM  Clinical Narrative:                 [name] spoke with pt at bedside. [name] introduced self, role, reason for visit. Pt from home with his sister and her roommate; he has a rolling walker and bedside commode. Pt states that he has had multiple psychosocial stressors including the loss of his partner, what sounds to be a recent eviction, and hospitalizations. Pt recently moved from [place]. [place], but states the address in the computer is a [place] address where he gets mail. Pt states that he has been drinking more than normal due to everything going on in life. When [name] inquired about pt interest in seeking support and offered pt resources he declines at this time and expresses wanting to work on it himself.   Pt stated to this writer that his disability is still pending. Pt interested in SNF placement. [name] explained that in my past experience it is not possible to place pts in SNF without disability approved. Pt states understanding. At home was utilizing his walker to get around. Pt gives permission for [name] to reach out to area SNFs to see if anyone will accept referral with disability pending. Will also be difficult to get pt [facility] therapy with Medicaid only. TOC spoke with therapy who will put on list to work with more often as able.   Expected Discharge Plan: [place] Barriers to Discharge: Continued Medical Work up, Inadequate or no insurance   Patient Goals and CMS Choice Patient states their goals for this hospitalization and ongoing recovery are:: go to SNF CMS Medicare.gov Compare Post Acute Care list provided to:: (n/a at this time) Choice offered to / list presented to : Patient  Expected Discharge Plan and Services Expected Discharge  Plan: [place] In-house Referral: Clinical Social Work Discharge Planning Services: CM Consult Post Acute Care Choice: Durable Medical Equipment, [place] Living arrangements for the past 2 months: No permanent address, Apartment   Prior Living Arrangements/Services Living arrangements for the past 2 months: No permanent address, Apartment Lives with:: Roommate, Siblings Patient language and need for interpreter reviewed:: Yes(no needs)        Need for Family Participation in Patient Care: Yes (Comment)(assistance with daily cares) Care giver support system in place?: Yes (comment)(pt sister/roommate) Current home services: DME Criminal Activity/Legal Involvement Pertinent to Current Situation/Hospitalization: No - Comment as needed  Activities of Daily Living Home Assistive Devices/Equipment: CPAP, Walker (specify type) ADL Screening (condition at time of admission) Patient's cognitive ability adequate to safely complete daily activities?: Yes Is the patient deaf or have difficulty hearing?: No Does the patient have difficulty seeing, even when wearing glasses/contacts?: No Does the patient have difficulty concentrating, remembering, or making decisions?: No Patient able to express need for assistance with ADLs?: Yes Does the patient have difficulty dressing or bathing?: No Independently performs ADLs?: Yes (appropriate for developmental age) Does the patient have difficulty walking or climbing stairs?: Yes Weakness of Legs: Both Weakness of Arms/Hands: None  Permission Sought/Granted Permission sought to share information with : Facility Art therapist granted to share information with : Yes, Verbal Permission Granted     Permission granted to share info w  AGENCY: SNFs    Emotional Assessment Appearance:: Appears stated age Attitude/Demeanor/Rapport: Engaged, Lethargic Affect (typically observed): Accepting, Adaptable,  Appropriate, Overwhelmed Orientation: : Oriented to Self, Oriented to Place, Oriented to  Time, Oriented to Situation Alcohol / Substance Use: Not Applicable Psych Involvement: (n/a)  Admission diagnosis:  Biliary colic [E26.83] Generalized abdominal pain [R10.84] Acute kidney injury ([place]) [N17.9] Elevated lipase [R74.8] Nausea and vomiting, intractability of vomiting not specified, unspecified vomiting type [R11.2] Patient Active Problem List   Diagnosis Date Noted   Pressure injury of skin 09/02/2019   Second degree AV block, Mobitz type I    Bifascicular block    PAC (premature atrial contraction)    PVCs (premature ventricular contractions)    PAF (paroxysmal atrial fibrillation) ([place])    Acute kidney injury ([place]) 09/01/2019   Sepsis ([place]) 05/29/2017   Hypomagnesemia 05/29/2017   Odontogenic infection of jaw 05/29/2017   Near syncope 05/29/2017   Hypertensive urgency 05/29/2017   Cough 08/09/2014   Balanitis 08/09/2014   Financial difficulties 08/09/2014   Health care maintenance 03/01/2014   Dyslipidemia 03/01/2014   Morbid obesity ([place]) 08/29/2013   Right shoulder pain 08/29/2013   Insomnia 08/29/2013   DM (diabetes mellitus) type 2, uncontrolled, without ketoacidosis 08/10/2013   Hypertension    Asthma    GERD (gastroesophageal reflux disease)    PCP:  Patient, No Pcp Per Pharmacy:   [facility] ([place] [place] [address] [place] [address] Phone: [phone] Fax: [place], [place] [address] Phone: [phone] Fax: [phone]  [place], [place] [address] [place] [address] Phone: [phone] Fax: [phone]   Readmission Risk Interventions Readmission Risk Prevention Plan 09/03/2019  Post Dischage Appt Not Complete  Appt Comments pt states he  switched his Medicaid to [place], was seeing [facility]  Medication Screening Complete  Transportation Screening Complete  Some recent data might be hidden

## 2019-09-03 NOTE — NC FL2 (Signed)
[place] LEVEL OF CARE SCREENING TOOL     IDENTIFICATION  Patient Name: Allen Gould Birthdate: [birth year] Sex: male Admission Date (Current Location): 09/01/2019  [place] and [place] Number:  Allen Gould [id number] [place] and Address:  [facility] [place] [address] [place], [place] [address]      Provider Number: [id number]  Attending Physician Name and Address:  Aldine Contes, MD  Relative Name and Phone Number:       Current Level of Care: Hospital Recommended Level of Care: [place] Prior Approval Number:    Date Approved/Denied:   PASRR Number: [id number]  Discharge Plan: SNF    Current Diagnoses: Patient Active Problem List   Diagnosis Date Noted  . Pressure injury of skin 09/02/2019  . Second degree AV block, Mobitz type I   . Bifascicular block   . PAC (premature atrial contraction)   . PVCs (premature ventricular contractions)   . PAF (paroxysmal atrial fibrillation) ([place])   . Acute kidney injury ([place]) 09/01/2019  . Sepsis ([place]) 05/29/2017  . Hypomagnesemia 05/29/2017  . Odontogenic infection of jaw 05/29/2017  . Near syncope 05/29/2017  . Hypertensive urgency 05/29/2017  . Cough 08/09/2014  . Balanitis 08/09/2014  . Financial difficulties 08/09/2014  . Health care maintenance 03/01/2014  . Dyslipidemia 03/01/2014  . Morbid obesity ([place]) 08/29/2013  . Right shoulder pain 08/29/2013  . Insomnia 08/29/2013  . DM (diabetes mellitus) type 2, uncontrolled, without ketoacidosis 08/10/2013  . Hypertension   . Asthma   . GERD (gastroesophageal reflux disease)     Orientation RESPIRATION BLADDER Height & Weight     Self, Time, Situation, Place  Normal Continent Weight: (!) 345 lb (156.5 kg) Height:  5' 10"  (177.8 cm)  BEHAVIORAL SYMPTOMS/MOOD NEUROLOGICAL BOWEL NUTRITION STATUS      Continent Diet(see discharge summary)  AMBULATORY STATUS COMMUNICATION OF NEEDS Skin   Extensive Assist  Verbally PU Stage and Appropriate Care, Other (Comment)(stage 2 PU on buttocks; MASD on groin and perineum)   PU Stage 2 Dressing: (foam dressing on buttocks)                   Personal Care Assistance Level of Assistance  Feeding, Bathing, Dressing Bathing Assistance: Maximum assistance Feeding assistance: Independent Dressing Assistance: Maximum assistance     Functional Limitations Info  Sight, Hearing, Speech Sight Info: Adequate Hearing Info: Adequate Speech Info: Adequate    SPECIAL CARE FACTORS FREQUENCY  PT (By licensed PT), OT (By licensed OT)     PT Frequency: 5x week OT Frequency: 5x week            Contractures Contractures Info: Not present    Additional Factors Info  Code Status, Allergies Code Status Info: Full Code Allergies Info: [place] Allergy           Current Medications (09/03/2019):  This is the current hospital active medication list Current Facility-Administered Medications  Medication Dose Route Frequency Provider Last Rate Last Admin  . acetaminophen (TYLENOL) tablet 650 mg  650 mg Oral Q6H PRN Jeanmarie Hubert, MD       Or  . acetaminophen (TYLENOL) suppository 650 mg  650 mg Rectal Q6H PRN Jeanmarie Hubert, MD      . aspirin tablet 325 mg  325 mg Oral Daily Jeanmarie Hubert, MD   325 mg at 09/03/19 1300  . cefTRIAXone (ROCEPHIN) 1 g in sodium chloride 0.9 % 100 mL IVPB  1 g Intravenous Q24H Jeanmarie Hubert, MD 200  mL/hr at 09/02/19 1804 1 g at 09/02/19 1804  . enoxaparin (LOVENOX) injection 75 mg  75 mg Subcutaneous Q24H Jeanmarie Hubert, MD   75 mg at 09/02/19 1953  . famotidine (PEPCID) tablet 10 mg  10 mg Oral BID Jeanmarie Hubert, MD   10 mg at 09/03/19 1259  . gabapentin (NEURONTIN) capsule 300 mg  300 mg Oral TID Jeanmarie Hubert, MD   300 mg at 09/03/19 1259  . LORazepam (ATIVAN) injection 0-4 mg  0-4 mg Intravenous Q12H Henderly, Britni A, PA-C       Or  . LORazepam (ATIVAN) tablet 0-4 mg  0-4 mg Oral Q12H Henderly, Britni  A, PA-C      . nystatin (MYCOSTATIN/NYSTOP) topical powder 1 application  1 application Topical BID Mosetta Anis, MD      . ondansetron [facility]) tablet 4 mg  4 mg Oral Q6H PRN Jeanmarie Hubert, MD       Or  . ondansetron [facility]) injection 4 mg  4 mg Intravenous Q6H PRN Jeanmarie Hubert, MD      . polyethylene glycol (MIRALAX / GLYCOLAX) packet 17 g  17 g Oral Daily PRN Jeanmarie Hubert, MD      . sodium chloride flush (NS) 0.9 % injection 3 mL  3 mL Intravenous Q12H Jeanmarie Hubert, MD      . thiamine tablet 100 mg  100 mg Oral Daily Jeanmarie Hubert, MD   100 mg at 09/03/19 1300   Or  . thiamine (B-1) injection 100 mg  100 mg Intravenous Daily Jeanmarie Hubert, MD   100 mg at 09/01/19 1626  . vitamin B-12 (CYANOCOBALAMIN) tablet 1,000 mcg  1,000 mcg Oral Daily Jeanmarie Hubert, MD   1,000 mcg at 09/03/19 1258   Facility-Administered Medications Ordered in Other Encounters  Medication Dose Route Frequency Provider Last Rate Last Admin  . lisinopril (PRINIVIL,ZESTRIL) tablet 20 mg  20 mg Oral Once Francesca Oman, DO         Discharge Medications: Please see discharge summary for a list of discharge medications.  Relevant Imaging Results:  Relevant Lab Results:   Additional Information SS#[id number] [employer]

## 2019-09-03 NOTE — Social Work (Signed)
Per chart review pt Medicaid coverage is [facility]; [name] has reached out to [facility] regarding placement. [name] also discussed with [facility] [employer] who accept Medicaid to see if coverage would cover any placement at this time.   Westley Hummer, MSW, [place] Work

## 2019-09-03 NOTE — Hospital Course (Signed)
Disposition and Follow-Up: [name] was discharged from [facility] in stable condition. At the hospital follow-up visit, please address:   Follow-Up Appointments:   Hospital Course by Problem List:  Nausea and Vomiting 2/2 UTI vs. Biliary Colic vs. 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

## 2019-09-03 NOTE — Evaluation (Signed)
Physical Therapy Evaluation Patient Details Name: Allen Gould MRN: [id number] DOB: December 10, 1965 Today's Date: 09/03/2019   History of Present Illness  Pt is a 54 y/o male with PMH of T2DM, asthma, HTN, morbid obesity, alcohol use who presents for nausea and vomiting x 4 days, epigastric pain, neuropathic pain in feet, scrotal rash and dysuria. ECG in ED revealed 2nd degree AV block.   Clinical Impression  Pt admitted with above diagnosis. Pt presents with profound weakness B LE's as well as neuropathic pain in standing which is limiting mobility. Required +2 mod A for bed to chair transfer and LE's trembling before pt able to pivot all the way to chair. Unable to stand long enough to tolerate ambulation at this point. Would not be able to get up 3 steps into his home in current condition.  Pt currently with functional limitations due to the deficits listed below (see PT Problem List). Pt will benefit from skilled PT to increase their independence and safety with mobility to allow discharge to the venue listed below.       Follow Up Recommendations Supervision/Assistance - 24 hour;SNF (if SNF denies, rec HHPT and increased HHaide hours if possible)    Equipment Recommendations  Wheelchair (measurements PT)(wide w/c)    Recommendations for Other Services       Precautions / Restrictions Precautions Precautions: Fall Restrictions Weight Bearing Restrictions: No      Mobility  Bed Mobility Overal bed mobility: Needs Assistance             General bed mobility comments: pt received sitting EOB with OT (see OT note for details)  Transfers Overall transfer level: Needs assistance Equipment used: Rolling walker (2 wheeled) Transfers: Sit to/from [facility] Sit to Stand: From elevated surface;+2 physical assistance;Min assist Stand pivot transfers: Mod assist;+2 physical assistance       General transfer comment: pt needed bed elevated significantly, use of  momentum for sit<>stand, min A +2 for stability. Pt had great difficulty stepping feet to chair, R more than L, needed mod support +2 for safety with use of RW. Pt's LE's trembling after 2 mins standing  Ambulation/Gait             General Gait Details: unable  Stairs            Wheelchair Mobility    Modified Rankin (Stroke Patients Only)       Balance Overall balance assessment: Needs assistance Sitting-balance support: No upper extremity supported;Feet supported Sitting balance-Leahy Scale: Good     Standing balance support: Bilateral upper extremity supported;During functional activity Standing balance-Leahy Scale: Poor Standing balance comment: heavily reliant on UE support                             Pertinent Vitals/Pain Pain Assessment: Faces Pain Score: 10-Worst pain ever Faces Pain Scale: Hurts even more Pain Location: B feet  Pain Descriptors / Indicators: Burning;Aching;Constant Pain Intervention(s): Limited activity within patient's tolerance;Monitored during session    Home Living Family/patient expects to be discharged to:: Private residence Living Arrangements: Other relatives;Non-relatives/Friends Available Help at Discharge: Personal care attendant;Available PRN/intermittently Type of Home: Apartment Home Access: Stairs to enter Entrance Stairs-Rails: None Entrance Stairs-Number of Steps: 3 Home Layout: One level Home Equipment: Walker - 2 wheels;Bedside commode;Grab bars - tub/shower;Other (comment)(lift chair) Additional Comments: lives with sister and her friend, they do not help him. Has Aide for 1 hour/day, helps with bathing only;  normally sleeps in recliner     Prior Function Level of Independence: Needs assistance   Gait / Transfers Assistance Needed: uses walker as needed, minimal mobility and reports using 'furniture" for a few steps to commode' sleeps in recliner   ADL's / Homemaking Assistance Needed: assist from  aide for bathing/dressing, uses "cup" as urinal and if has to get to restroom can with assist   Comments: pt reports his mobility has been poor past 2 years. Was living in [place] and getting [facility] services. Wife died in Aug 05, 2022 and he reports landlord kicked him out of home during her funeral. Has been staying with his sister in [place] since that time.      Hand Dominance   Dominant Hand: Right    Extremity/Trunk Assessment   Upper Extremity Assessment Upper Extremity Assessment: Defer to OT evaluation    Lower Extremity Assessment Lower Extremity Assessment: Generalized weakness;RLE deficits/detail;LLE deficits/detail RLE Deficits / Details: hip flex 2-/5, knee ext 3-/5, knee flex 3/5 RLE Sensation: history of peripheral neuropathy RLE Coordination: decreased gross motor LLE Deficits / Details: hip flex 2-/5, knee ext 3-/5, knee flex 3/5 LLE Sensation: history of peripheral neuropathy LLE Coordination: decreased gross motor    Cervical / Trunk Assessment Cervical / Trunk Assessment: Other exceptions Cervical / Trunk Exceptions: posture affected by body habitus  Communication   Communication: No difficulties  Cognition Arousal/Alertness: Awake/alert Behavior During Therapy: Flat affect Overall Cognitive Status: No family/caregiver present to determine baseline cognitive functioning                                 General Comments: seems depressed of mood and at times angry, relays that he needs more support and does not have it. Cognition appears [facility] but emotional state limiting him significantly      General Comments General comments (skin integrity, edema, etc.): SPO2 92% on RA. pt with increased B foot pain in standing, feet elevated in recliner    Exercises General Exercises - Lower Extremity Ankle Circles/Pumps: AROM;Both;10 reps;Seated   Assessment/Plan    PT Assessment Patient needs continued PT services  PT Problem List Decreased  strength;Decreased activity tolerance;Decreased range of motion;Decreased balance;Decreased mobility;Decreased coordination;Decreased knowledge of precautions;Pain;Obesity;Impaired sensation;Cardiopulmonary status limiting activity       PT Treatment Interventions DME instruction;Gait training;Stair training;Functional mobility training;Therapeutic activities;Therapeutic exercise;Balance training;Neuromuscular re-education;Cognitive remediation;Patient/family education    PT Goals (Current goals can be found in the Care Plan section)  Acute Rehab PT Goals Patient Stated Goal: got to rehab PT Goal Formulation: With patient Time For Goal Achievement: 09/17/19 Potential to Achieve Goals: Fair    Frequency Min 3X/week   Barriers to discharge Decreased caregiver support;Inaccessible home environment STE and no consistent family support    Co-evaluation PT/OT/SLP Co-Evaluation/Treatment: Yes Reason for Co-Treatment: Necessary to address cognition/behavior during functional activity;For patient/therapist safety;Complexity of the patient's impairments (multi-system involvement) PT goals addressed during session: Mobility/safety with mobility;Balance;Proper use of DME;Strengthening/ROM         AM-PAC PT "6 Clicks" Mobility  Outcome Measure Help needed turning from your back to your side while in a flat bed without using bedrails?: A Lot Help needed moving from lying on your back to sitting on the side of a flat bed without using bedrails?: A Lot Help needed moving to and from a bed to a chair (including a wheelchair)?: A Lot Help needed standing up from a chair using your arms (e.g.,  wheelchair or bedside chair)?: A Lot Help needed to walk in hospital room?: Total Help needed climbing 3-5 steps with a railing? : Total 6 Click Score: 10    End of Session Equipment Utilized During Treatment: Gait belt Activity Tolerance: Patient limited by fatigue;Patient limited by pain Patient left: in  chair;with call bell/phone within reach Nurse Communication: Mobility status PT Visit Diagnosis: Unsteadiness on feet (R26.81);Muscle weakness (generalized) (M62.81);Difficulty in walking, not elsewhere classified (R26.2);Pain Pain - Right/Left: (bilat) Pain - part of body: Ankle and joints of foot    Time: 9806-9996 PT Time Calculation (min) (ACUTE ONLY): 20 min   Charges:   PT Evaluation $PT Eval Moderate Complexity: [place]  Pager [phone] Office [place] 09/03/2019, 11:56 AM

## 2019-09-03 NOTE — Progress Notes (Signed)
Subjective: Patient examined and evaluated at bedside. Mr. Buhl was resting comfortably in bed upon approach. He endorses a resolution in nausea and vomiting with no episodes overnight. After ~9 days of constipation, patient reports having 8 bowel movements overnight, which he attributes to lactulose regimen. He still complains of mild lower abdominal pain but has had no dysuria. Maintains good PO intake. Discussed plan for continued antibiotics as well as PT/OT evaluation for dispo planning.   Objective:  Vital signs in last 24 hours: Vitals:   09/02/19 0528 09/02/19 1249 09/02/19 2036 09/03/19 0535  BP: 122/64 136/79 114/63 111/79  Pulse: (!) 53 68 71 (!) 52  Resp: 18 20 17 17   Temp: 97.7 F (36.5 C) 97.7 F (36.5 C) 98 F (36.7 C) 97.9 F (36.6 C)  TempSrc: Oral Oral Oral Oral  SpO2: 100% 97% 98% 100%  Weight:      Height:       Weight change:   Intake/Output Summary (Last 24 hours) at 09/03/2019 0920 Last data filed at 09/02/2019 2332 Gross per 24 hour  Intake 1174.42 ml  Output 600 ml  Net 574.42 ml   General appearance: alert, cooperative, appears stated age, no distress and morbidly obese Head: Normocephalic, without obvious abnormality, atraumatic Lungs: clear to auscultation bilaterally Heart: regular rate and rhythm, S1, S2 normal, no murmur, click, rub or gallop Abdomen: obese habitus limits exam, bowel sounds normal, mild tenderness to palpation across lower abdomen. No rebeound tenderness or guarding. Extremities: No peripheral edema Skin: Erythema and tenderness in B intertriginous groin area. Improved since admission. Neurologic: Grossly normal Psych: Mood and affect normal and congruent   Assessment/Plan:  Active Problems:   Acute kidney injury (HCC)   Pressure injury of skin   Second degree AV block, Mobitz type I   Bifascicular block   PAC (premature atrial contraction)   PVCs (premature ventricular contractions)   PAF (paroxysmal atrial  fibrillation) ([place])  Summary: Mr. Adryel Wortmann is a 54 year old man with a past medical history of T2DM, asthma, PAF, HTN, and morbid obesity who presents for new heart block and intractable nausea/vomiting.  # Nausea and Vomiting 2/2 Biliary Colic vs. UTI vs. Cellulitis vs. Constipation: Patient reports resolution of nausea and vomiting overnight with no episodes. He also reports 8 bowel movements overnight, attributed to lactulose. However, Mr. Sirico still complains of lower abdominal pain and is tender in the same distribution on exam without peritoneal signs. Abdominal U/S 4/11 demonstrates possible small amount of gallbladder sludge. No other features to suggest cholecystitis.  Upon inspection, cellulitis in groin shows marked improvement with minimal erythema and tenderness. No purulence noted. Altogether, picture consistent with UTI and constipation contributing abdominal pain, nausea, and vomiting with element of urinary stasis 2/2 constipation. This is supported by marked symptomatic improvement after starting to have bowel movements and initiation of antibiotics. -Continue CTX, day 3/5 for uncomplicated UTI -Stop Vancomycin -Start home nystatin powder for ppx of intertriginous area rashes -Stop lactulose -Continue Miralax PRN for constipation -Advance diet to Carb Smart/Heart Healthy Diet -Continue Zofran PRN for nausea  # 2nd Degree AV Block: Patient has been evaluated by Cariology. Patient denies any syncope, dizziness, or weakness. BP has remained well-controlled with no significant bradycardia or pauses. He also has a history of PAF/atrial flutter. We appreciate the evaluation and recommendations of our Cardiology colleagues. -No indication for PPM at this time -Currently on full dose Lovenox, as below. Per Cardiology, recommend avoiding anticoagulation; consider re-initiation as an outpatient  once he demonstrates compliance with medications and follow-up with avoidance of alcohol.    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 Was placed on CIWA protocol at admission but has scored 0 over the past 24+ hours. CT abd/pelvis with evidence for nodularity with patient history of NAFLD. -Stop CIWA assessments at 1200 today -Thiamine and folate, as below  # GERD: History and presenting symptoms consistent with GERD. -Continue famotidine  # AKI, Resolved: Creatinine of 1.74 on admission, continuing to trend down from 1.26 on 4/11 to 1.06 this a.m. s/p 2L bolus NS in ED and LR mIVF. Most likely due to protracted vomiting and limited oral hydration prior to admission. -Avoid nephrotoxic agents  # Macrocytic Anemia: Hemoglobin of 8.5 on admission. Trending from 7.8 to 7.3 over the past day. Likely dilutional. B12 2055 on 4/11 after starting oral B12 4/10.  -Continue Folate and B12 supplementation  Hepatic Lesion Incidental finding on CT. Need dedicated imaging to f/u -F/u outpatient for multiphase imaging  Pulmonary Nodules Incidental finding on CT. Need repeat imaging in 6-12 months -F/u outpatient for non-contrast CT in 6-12 months  Chronic Medical Problems: Hypertension: BP well-controlled during admission. Hold home meds T2DM: Home metformin held in context of AKI. Random BG 121-157 during admission. 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  PT/OT: Consulted Diet: Advance from full liquid to Carb Smart/Heart Healthy Diet, as tolerated DVT ppx: Lovenox 75 mg SQ daily  Dispo: -Pending PT/OT evaluation and recommendations -Social Work has been consulted who plan to f/u after PT/OT evaluation regarding appropriate placement -Anticipated discharge pending clinical improvement       LOS: 2 days   Landry Mellow, Medical Student 09/03/2019, 9:20 AM

## 2019-09-03 NOTE — Progress Notes (Signed)
Progress Note  Patient Name: [name] Date of Encounter: 09/03/2019  Primary Cardiologist: Dr Radford Pax  Subjective   Patient describes some dyspnea.  He denies chest pain or syncope.  Inpatient Medications    Scheduled Meds:  aspirin  325 mg Oral Daily   enoxaparin (LOVENOX) injection  75 mg Subcutaneous Q24H   famotidine  10 mg Oral BID   gabapentin  300 mg Oral TID   lactulose  20 g Oral BID   LORazepam  0-4 mg Intravenous Q6H   Or   LORazepam  0-4 mg Oral Q6H   LORazepam  0-4 mg Intravenous Q12H   Or   LORazepam  0-4 mg Oral Q12H   sodium chloride flush  3 mL Intravenous Q12H   thiamine  100 mg Oral Daily   Or   thiamine  100 mg Intravenous Daily   vitamin B-12  1,000 mcg Oral Daily   Continuous Infusions:  cefTRIAXone (ROCEPHIN)  IV 1 g (09/02/19 1804)   vancomycin 1,750 mg (09/02/19 1957)   PRN Meds: acetaminophen **OR** acetaminophen, ondansetron **OR** ondansetron (ZOFRAN) IV, polyethylene glycol   Vital Signs    Vitals:   09/02/19 0528 09/02/19 1249 09/02/19 2036 09/03/19 0535  BP: 122/64 136/79 114/63 111/79  Pulse: (!) 53 68 71 (!) 52  Resp: 18 20 17 17   Temp: 97.7 F (36.5 C) 97.7 F (36.5 C) 98 F (36.7 C) 97.9 F (36.6 C)  TempSrc: Oral Oral Oral Oral  SpO2: 100% 97% 98% 100%  Weight:      Height:        Intake/Output Summary (Last 24 hours) at 09/03/2019 1000 Last data filed at 09/02/2019 2332 Gross per 24 hour  Intake 1174.42 ml  Output 600 ml  Net 574.42 ml   Last 3 Weights 09/01/2019 05/29/2017 05/28/2017  Weight (lbs) 345 lb 282 lb 6.6 oz 287 lb  Weight (kg) 156.491 kg 128.1 kg 130.182 kg      Telemetry    Sinus with PACs, PVCs and occasional Mobitz 1.- Personally Reviewed  Physical Exam   GEN: No acute distress.  Obese Neck: No JVD Cardiac: RRR, no murmurs, rubs, or gallops.  Respiratory: Clear to auscultation bilaterally. GI: Soft, nontender, non-distended  MS: No edema Neuro:  Nonfocal  Psych: Normal  affect   Labs    High Sensitivity Troponin:   Recent Labs  Lab 09/01/19 1604 09/01/19 1828  TROPONINIHS 18* 21*      Chemistry Recent Labs  Lab 09/01/19 1308 09/01/19 1308 09/01/19 1613 09/02/19 0434 09/03/19 0341  NA 132*   < > 131* 135 133*  K 3.7   < > 3.7 3.3* 3.9  CL 91*  --   --  95* 96*  CO2 15*  --   --  23 23  GLUCOSE 148*  --   --  121* 157*  BUN 14  --   --  14 12  CREATININE 1.74*  --   --  1.26* 1.06  CALCIUM 9.3  --   --  8.9 8.8*  PROT 7.8  --   --  7.0 6.8  ALBUMIN 3.2*  --   --  2.7* 2.7*  AST 25  --   --  27 21  ALT 15  --   --  13 11  ALKPHOS 72  --   --  66 63  BILITOT 1.8*  --   --  1.5* 0.8  GFRNONAA 43*  --   --  >60 >  60  GFRAA 50*  --   --  >60 >60  ANIONGAP 26*  --   --  17* 14   < > = values in this interval not displayed.     Hematology Recent Labs  Lab 09/01/19 1308 09/01/19 1308 09/01/19 1613 09/02/19 0434 09/03/19 0341  WBC 7.4  --   --  3.8* 4.4  RBC 2.55*  --   --  2.31* 2.21*  HGB 8.5*   < > 9.5* 7.8* 7.3*  HCT 26.4*   < > 28.0* 23.9* 22.5*  MCV 103.5*  --   --  103.5* 101.8*  MCH 33.3  --   --  33.8 33.0  MCHC 32.2  --   --  32.6 32.4  RDW 14.6  --   --  14.8 14.5  PLT 241  --   --  207 199   < > = values in this interval not displayed.    Radiology    US Abdomen Complete  Result Date: 09/02/2019 CLINICAL DATA:  Biliary colic EXAM: ABDOMEN ULTRASOUND COMPLETE COMPARISON:  CT 09/01/2019 FINDINGS: Gallbladder: Possible small amount of sludge in the gallbladder. No shadowing stones. No wall thickening. Negative sonographic Murphy. Common bile duct: Diameter: 4.8 mm Liver: Liver is echogenic and poorly penetrated. This limits evaluation for focal abnormality. Portal vein is patent on color Doppler imaging with normal direction of blood flow towards the liver. IVC: No abnormality visualized. Pancreas: Not well visualized. Spleen: Size and appearance within normal limits. Right Kidney: Length: 12.5 cm. Echogenicity within  normal limits. No mass or hydronephrosis visualized. Left Kidney: Length: 12.1 cm. Echogenicity within normal limits. No mass or hydronephrosis visualized. Abdominal aorta: Maximum diameter of 2.7 cm. Distal aorta and bifurcation not well seen. Other findings: None. IMPRESSION: 1. Limited study secondary to habitus. 2. Possible small amount of gallbladder sludge. No other features to suggest acute cholecystitis 3. Echogenic liver consistent with steatosis. Very limited evaluation for focal parenchymal abnormality at the liver. Electronically Signed   By: Donavan Foil M.D.   On: 09/02/2019 03:52   DG Abdomen Acute W/Chest  Result Date: 09/01/2019 CLINICAL DATA:  Emesis EXAM: DG ABDOMEN ACUTE W/ 1V CHEST COMPARISON:  None. FINDINGS: Limited exam secondary to poor penetration from patient body habitus. Relative paucity of bowel gas throughout the abdomen with scattered air in stool seen within the colon. There is no evidence of dilated bowel loops or free intraperitoneal air within the limitations of this exam. No radiopaque calculi or other significant radiographic abnormality is seen. Heart size and mediastinal contours are within normal limits. 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 IMPRESSION: Paucity of bowel gas throughout the abdomen with scattered air and stool within the colon. No evidence of bowel obstruction. Electronically Signed   By: Davina Poke D.O.   On: 09/01/2019 13:28   CT Renal Stone Study  Result Date: 09/01/2019 CLINICAL DATA:  Hematuria unknown cause EXAM: CT ABDOMEN AND PELVIS WITHOUT CONTRAST TECHNIQUE: Multidetector CT imaging of the abdomen and pelvis was performed following the standard protocol without IV contrast. COMPARISON:  None FINDINGS: Lower chest: Nodule in the left lung base 8 mm. Calcified coronary artery disease. Normal heart size. No pericardial effusion. Hepatobiliary: Marked hepatic steatosis with nodular morphology. Lesion in the posterior  right hemi liver measures 2 x 2.3 cm less dense than surrounding fatty changes. Abundant gallstones in the gallbladder. No pericholecystic stranding. Gallbladder distension is moderate. No gross biliary ductal dilation. Pancreas: Pancreas is normal without  focal lesion or ductal dilation. No peripancreatic inflammation. Spleen: Spleen is normal size without focal lesion. Adrenals/Urinary Tract: Adrenal glands are normal. No nephrolithiasis or hydronephrosis. Bilateral perinephric stranding without focal fluid on noncontrast imaging. Stomach/Bowel: Small hiatal hernia. Small bowel is normal caliber. Post appendectomy. Suggestion of mural stratification of the ascending colon. Colon is under distended limiting assessment. Stool throughout the remainder of the colon. Vascular/Lymphatic: Scattered atherosclerosis of the abdominal aorta. No adenopathy in the upper abdomen or in the retroperitoneum. No pelvic lymphadenopathy. Reproductive: Prostate unremarkable by CT. Other: No free air. No ascites. Fat containing umbilical hernia. Musculoskeletal: Spinal degenerative changes. Degenerative changes in the bilateral hips. No acute bone finding or destructive bone process. IMPRESSION: 1. No nephrolithiasis or hydronephrosis. No findings to explain hematuria on noncontrast imaging. Bilateral perinephric stranding could be seen in the setting of urinary tract infection but is nonspecific. Consider follow-up imaging utilizing hematuria protocol on follow-up when appropriate. 2. Marked hepatic steatosis with nodular morphology. Lesion in the posterior right hemi liver measures 2 x 2.3 cm less dense than surrounding fatty changes. This is of uncertain significance but the patient appears to have findings of liver disease and potentially early cirrhosis. Would suggest follow-up multiphase imaging of the liver on an outpatient basis to further assess for underlying liver lesions. In the meantime correlation with clinical and  laboratory studies to assess for clinical signs of liver disease is suggested. 3. Gallbladder distension without pericholecystic stranding likely with extensive sludge and/or stones in the gallbladder. 4. Suggestion of mural stratification of the ascending colon. This could be due to underdistention, mild colitis or more likely prior colitis. 5. 8 mm pulmonary nodule in the left lung base. Non-contrast chest CT at 6-12 months is recommended. If the nodule is stable at time of repeat CT, then future CT at 18-24 months (from today's scan) is considered optional for low-risk patients, but is recommended for high-risk patients. This recommendation follows the consensus statement: Guidelines for Management of Incidental Pulmonary Nodules Detected on CT Images: From the Fleischner Society 2017; Radiology 2017; 284:228-243. 6. Fat containing umbilical hernia. 7. Aortic atherosclerosis. These results were called by telephone at the time of interpretation on 09/01/2019 at 5:23 pm to provider Nuala Alpha , who verbally acknowledged these results. Aortic Atherosclerosis (ICD10-I70.0). Electronically Signed   By: Zetta Bills M.D.   On: 09/01/2019 17:20    Patient Profile     54 y.o. 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  Concerns significant amounts of alcohol at home.  Cardiology seen for bradycardia/heart block.  Also with history of atrial fibrillation/flutter by report.  Echocardiogram July 2020 showed normal LV function, mild right ventricular enlargement, mild biatrial enlargement, mild mitral regurgitation.  Assessment & Plan    1 Mobitz 1 second-degree AV block-patient denies presyncope or syncope.  LV function has been normal.  Avoid AV nodal blocking agents.  No indication for pacemaker at present.  He will need to be followed as an  outpatient.  2 history of paroxysmal fibrillation/flutter-patient presently in sinus rhythm. CHADSvasc 3.  He would benefit from anticoagulation to reduce the risk of embolic event.  However given history of alcohol abuse I would favor avoiding anticoagulation for now.  If he demonstrates compliance with medications and follow-up and avoidance of alcohol then would plan to reinitiate apixaban at that time.  3 history of hypertension-blood pressure appears to  be controlled on the medications.  Would follow.  4 liver/lung nodules-Per primary care.  5 macrocytic anemia-further evaluation and management per primary care.  Cardiology will sign off.  Please avoid AV nodal blocking agents.  As above would not anticoagulate unless he demonstrates compliance with follow-up, medications and avoidance of alcohol.  Please have patient follow-up with Dr. Radford Pax in 4 to 6 weeks.  Please call with questions.  For questions or updates, please contact [place] Please consult [email or web address] for contact info under        Signed, Kirk Ruths, MD  09/03/2019, 10:00 AM

## 2019-09-04 DIAGNOSIS — M79641 Pain in right hand: Secondary | ICD-10-CM

## 2019-09-04 LAB — COMPREHENSIVE METABOLIC PANEL
ALT: 11 U/L (ref 0–44)
AST: 24 U/L (ref 15–41)
Albumin: 2.7 g/dL — ABNORMAL LOW (ref 3.5–5.0)
Alkaline Phosphatase: 76 U/L (ref 38–126)
Anion gap: 8 (ref 5–15)
BUN: 14 mg/dL (ref 6–20)
CO2: 26 mmol/L (ref 22–32)
Calcium: 8.6 mg/dL — ABNORMAL LOW (ref 8.9–10.3)
Chloride: 95 mmol/L — ABNORMAL LOW (ref 98–111)
Creatinine, Ser: 1.14 mg/dL (ref 0.61–1.24)
GFR calc Af Amer: >60 mL/min (ref >60)
GFR calc non Af Amer: >60 mL/min (ref >60)
Glucose, Bld: 230 mg/dL — ABNORMAL HIGH (ref 70–99)
Potassium: 3.6 mmol/L (ref 3.5–5.1)
Sodium: 129 mmol/L — ABNORMAL LOW (ref 135–145)
Total Bilirubin: 0.8 mg/dL (ref 0.3–1.2)
Total Protein: 7 g/dL (ref 6.5–8.1)

## 2019-09-04 LAB — CBC
HCT: 21.4 % — ABNORMAL LOW (ref 39.0–52.0)
Hemoglobin: 7.1 g/dL — ABNORMAL LOW (ref 13.0–17.0)
MCH: 33.5 pg (ref 26.0–34.0)
MCHC: 33.2 g/dL (ref 30.0–36.0)
MCV: 100.9 fL — ABNORMAL HIGH (ref 80.0–100.0)
Platelets: 187 10*3/uL (ref 150–400)
RBC: 2.12 MIL/uL — ABNORMAL LOW (ref 4.22–5.81)
RDW: 14.6 % (ref 11.5–15.5)
WBC: 5.3 10*3/uL (ref 4.0–10.5)
nRBC: 0.4 % — ABNORMAL HIGH (ref 0.0–0.2)

## 2019-09-04 LAB — SARS CORONAVIRUS 2 (TAT 6-24 HRS): SARS Coronavirus 2: NEGATIVE

## 2019-09-04 NOTE — TOC Progression Note (Signed)
Transition of Care [facility]) - Progression Note    Patient Details  Name: Allen Gould MRN: [id number] Date of Birth: [birth year]  Transition [facility]) CM/SW [facility], [place] Phone Number: 09/04/2019, 11:18 AM  Clinical Narrative:    [name] received a response from [facility], they are willing to consider an LOG placement. Chris, admissions liaison to come by and visit with patient. [name] provided pt with CMS ratings for [facility] and explained LOG/visit from [place]. Pt tired, but welcoming of visit. Have requested new COVID screen from MD, await determination of visit with SNF.    Expected Discharge Plan: [place] Barriers to Discharge: Continued Medical Work up, Inadequate or no insurance  Expected Discharge Plan and Services Expected Discharge Plan: [place] In-house Referral: Clinical Social Work Discharge Planning Services: CM Consult Post Acute Care Choice: [place], [place] Living arrangements for the past 2 months: No permanent address, Apartment               Readmission Risk Interventions Readmission Risk Prevention Plan 09/03/2019  Post Dischage Appt Not Complete  Appt Comments pt states he switched his Medicaid to [facility], was seeing [facility]  Medication Screening Complete  Transportation Screening Complete  Some recent data might be hidden

## 2019-09-04 NOTE — Progress Notes (Signed)
Subjective: Patient examined and evaluated at bedside. Mr. Cockerell was resting comfortably in bed upon approach. Denies episodes of nausea/vomiting. Reports resolution in abdominal pain overnight. Patient notes new R hand and wrist swelling with pain which he has never had before. Otherwise no acute changes. Discussed plan for continued antibiotic as well as importance of working with PT/OT for clinical improvement. [name] currently working on [employer] of SNF versus HH depending on bed openings and insurance. Likely discharge today.  Objective:  Vital signs in last 24 hours: Vitals:   09/03/19 0535 09/03/19 1558 09/03/19 2038 09/04/19 0529  BP: 111/79 103/79 (!) 96/58 96/67  Pulse: (!) 52 (!) 57 73 66  Resp: 17 20 15 16   Temp: 97.9 F (36.6 C) 100.1 F (37.8 C) 97.8 F (36.6 C) 97.7 F (36.5 C)  TempSrc: Oral Oral Oral Oral  SpO2: 100% 94% 100% 100%  Weight:      Height:       Weight change:   Intake/Output Summary (Last 24 hours) at 09/04/2019 1048 Last data filed at 09/04/2019 0531 Gross per 24 hour  Intake 1140 ml  Output 400 ml  Net 740 ml   General appearance: alert, cooperative, appears stated age, no distress and morbidly obese Head: Normocephalic, without obvious abnormality, atraumatic Lungs: clear to auscultation bilaterally Heart: regular rate and rhythm, S1, S2 normal, no murmur, click, rub or gallop Abdomen: obese habitus limits exam, bowel sounds normal, non-tender throughout Extremities: No peripheral edema Skin: Minimal erythema and tenderness in B intertriginous groin area with interval improvement Neurologic: Grossly normal Psych: Mood and affect normal and congruent   Assessment/Plan:  Active Problems:   Acute kidney injury (HCC)   Pressure injury of skin   Second degree AV block, Mobitz type I   Bifascicular block   PAC (premature atrial contraction)   PVCs (premature ventricular contractions)   PAF (paroxysmal atrial fibrillation) (HCC)   Summary: Mr. Wilberto Hoyos is a 54 year old man with a past medical history of T2DM, asthma, PAF, HTN, and morbid obesity who presents for new heart block and intractable nausea/vomiting.  # Nausea and Vomiting 2/2 Biliary Colic vs. UTI vs. Cellulitis vs. Constipation: Patient reports resolution of nausea and vomiting overnight with no episodes. Lower abdominal pain has resolved overnight. Upon inspection, cellulitis in groin shows marked improvement with minimal erythema and tenderness. No purulence noted. -Continue CTX, day 4/5 for uncomplicated UTI and cellulitis -Plan for discharge today with SNF placement, send home with 1-day oral equivalent of CTX -Continue home nystatin powder for ppx of intertriginous area rashes -Continue Miralax PRN for constipation -Continue Zofran PRN for nausea  # 2nd Degree AV Block: Patient has been evaluated by Cariology. Patient denies any syncope, dizziness, or weakness. BP has remained well-controlled with no significant bradycardia or pauses. He also has a history of PAF/atrial flutter. We appreciate the evaluation and recommendations of our Cardiology colleagues. -No indication for PPM at this time -Currently on full dose Lovenox, as below. Per Cardiology, recommend avoiding anticoagulation; consider re-initiation as an outpatient once he demonstrates compliance with medications and follow-up with avoidance of alcohol.  -Continue ASA 325 mg daily -Continue to monitor on tele -Plan for discharge today, counsel patient about the importance of close outpatient follow up as well as continued abstinence from alcohol.  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 CT abd/pelvis with evidence for nodularity  with patient history of NAFLD. -Thiamine and folate, as below  # GERD: History and presenting symptoms consistent with GERD. -Continue  famotidine  # AKI, Resolved: Creatinine of 1.74 on admission, 1,14 this a.m. Improvement s/p IVF, improved PO intake, and with resolution of N/V. -Avoid nephrotoxic agents  # Macrocytic Anemia: Hemoglobin of 8.5 on admission. Trending from 7.3 to 7.1 over the past day. Likely dilutional. B12 2055 on 4/11 after starting oral B12 4/10.  -Continue Folate and B12 supplementation  Hepatic Lesion Incidental finding on CT. Need dedicated imaging to f/u -F/u outpatient for multiphase imaging  Pulmonary Nodules Incidental finding on CT. Need repeat imaging in 6-12 months -F/u outpatient for non-contrast CT in 6-12 months  Chronic Medical Problems: Hypertension: BP well-controlled during admission. Hold home meds T2DM: Home metformin held in context of AKI. Random BG 121-157 during admission. 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  PT/OT: Recommend SNF, 24-hour assistance Diet: Carb Smart/Heart Healthy Diet, as tolerated DVT ppx: Lovenox 75 mg SQ daily  Dispo: -Social Work has been consulted and arranging appropriate placement PT/OT: Recommendations as above -Anticipated discharge today pending SNF placement       LOS: 3 days   Landry Mellow, Medical Student 09/04/2019, 10:48 AM

## 2019-09-04 NOTE — TOC Progression Note (Signed)
Transition of Care [facility]) - Progression Note    Patient Details  Name: Allen Gould MRN: [id number] Date of Birth: 1965/09/22  Transition of Care [facility]) CM/SW [place], [place] Phone Number: 09/04/2019, 10:39 AM  Clinical Narrative:    [name] acknowledging additional consult for SNF placement. At this time pt would need LOG contract for placement, no definite offers thus far. TOC team has updated Dr. Truman Hayward and Dr. Berline Lopes about these barriers (insurance, disability pending status). [name] also discussed that finding a [place] agency to provide [facility] would also be difficult and cannot be guaranteed.   TOC team continues to seek disposition support as able.    Expected Discharge Plan: [place] Barriers to Discharge: Continued Medical Work up, Inadequate or no insurance  Expected Discharge Plan and Services Expected Discharge Plan: [place] In-house Referral: Clinical Social Work Discharge Planning Services: CM Consult Post Acute Care Choice: [place], [place] Living arrangements for the past 2 months: No permanent address, Apartment  Readmission Risk Interventions Readmission Risk Prevention Plan 09/03/2019  Post Dischage Appt Not Complete  Appt Comments pt states he switched his Medicaid to [facility], was seeing [facility]  Medication Screening Complete  Transportation Screening Complete  Some recent data might be hidden

## 2019-09-05 DIAGNOSIS — K7030 Alcoholic cirrhosis of liver without ascites: Secondary | ICD-10-CM

## 2019-09-05 DIAGNOSIS — N390 Urinary tract infection, site not specified: Secondary | ICD-10-CM

## 2019-09-05 DIAGNOSIS — Z6841 Body Mass Index (BMI) 40.0 and over, adult: Secondary | ICD-10-CM

## 2019-09-05 LAB — COMPREHENSIVE METABOLIC PANEL
ALT: 11 U/L (ref 0–44)
AST: 24 U/L (ref 15–41)
Albumin: 2.6 g/dL — ABNORMAL LOW (ref 3.5–5.0)
Alkaline Phosphatase: 79 U/L (ref 38–126)
Anion gap: 11 (ref 5–15)
BUN: 16 mg/dL (ref 6–20)
CO2: 25 mmol/L (ref 22–32)
Calcium: 8.7 mg/dL — ABNORMAL LOW (ref 8.9–10.3)
Chloride: 94 mmol/L — ABNORMAL LOW (ref 98–111)
Creatinine, Ser: 1.18 mg/dL (ref 0.61–1.24)
GFR calc Af Amer: >60 mL/min (ref >60)
GFR calc non Af Amer: >60 mL/min (ref >60)
Glucose, Bld: 273 mg/dL — ABNORMAL HIGH (ref 70–99)
Potassium: 3.6 mmol/L (ref 3.5–5.1)
Sodium: 130 mmol/L — ABNORMAL LOW (ref 135–145)
Total Bilirubin: 0.8 mg/dL (ref 0.3–1.2)
Total Protein: 7.1 g/dL (ref 6.5–8.1)

## 2019-09-05 LAB — CBC
HCT: 23.4 % — ABNORMAL LOW (ref 39.0–52.0)
Hemoglobin: 7.4 g/dL — ABNORMAL LOW (ref 13.0–17.0)
MCH: 32.2 pg (ref 26.0–34.0)
MCHC: 31.6 g/dL (ref 30.0–36.0)
MCV: 101.7 fL — ABNORMAL HIGH (ref 80.0–100.0)
Platelets: 266 10*3/uL (ref 150–400)
RBC: 2.3 MIL/uL — ABNORMAL LOW (ref 4.22–5.81)
RDW: 14.8 % (ref 11.5–15.5)
WBC: 5.1 10*3/uL (ref 4.0–10.5)
nRBC: 2.5 % — ABNORMAL HIGH (ref 0.0–0.2)

## 2019-09-05 MED ORDER — FOLIC ACID 1 MG PO TABS: 1.0000 mg | ORAL_TABLET | Freq: Every day | ORAL | 0 refills | Status: DC

## 2019-09-05 MED ORDER — FAMOTIDINE 10 MG PO TABS: 10.0000 mg | ORAL_TABLET | Freq: Two times a day (BID) | ORAL | 0 refills | Status: DC

## 2019-09-05 NOTE — Progress Notes (Signed)
Attempted three times to give report without success. [facility] called for transportation .

## 2019-09-05 NOTE — Discharge Summary (Addendum)
Name: Allen Gould MRN: [id number] DOB: August 27, 1965 54 y.o. PCP: Patient, No Pcp Per  Date of Admission: 09/01/2019 11:46 AM Date of Discharge:  09/05/19 Attending Physician: Aldine Contes, MD  Discharge Diagnosis: 1. Urinary Tract Infection 2. Alcoholic Cirrhosis 3. Macrocytic Anemia 4. Second degree type 1 heart block 5. Incidental Liver lesion 6. Incidental Pulmonary nodule   Discharge Medications: Allergies as of 09/05/2019      Reactions   Shellfish Allergy Hives      Medication List    STOP taking these medications   cyclobenzaprine 10 MG tablet Commonly known as: FLEXERIL   hydrochlorothiazide 25 MG tablet Commonly known as: HYDRODIURIL   ibuprofen 200 MG tablet Commonly known as: ADVIL   lisinopril 20 MG tablet Commonly known as: ZESTRIL   metoprolol tartrate 25 MG tablet Commonly known as: LOPRESSOR     TAKE these medications   amLODipine 10 MG tablet Commonly known as: NORVASC Take 10 mg by mouth daily.   aspirin 325 MG tablet Take 325 mg by mouth daily as needed for moderate pain.   atorvastatin 40 MG tablet Commonly known as: LIPITOR Take 1 tablet (40 mg total) by mouth daily at 6 PM.   carvedilol 12.5 MG tablet Commonly known as: COREG Take 12.5 mg by mouth 2 (two) times daily with a meal.   folic acid 1 MG tablet Commonly known as: FOLVITE Take 1 tablet (1 mg total) by mouth daily.   gabapentin 300 MG capsule Commonly known as: NEURONTIN Take 1 capsule by mouth in the morning, at noon, and at bedtime.   insulin aspart protamine- aspart (70-30) 100 UNIT/ML injection Commonly known as: NOVOLOG MIX 70/30 Inject 0.2 mLs (20 Units total) into the skin 2 (two) times daily with a meal.   metFORMIN 1000 MG tablet Commonly known as: GLUCOPHAGE Take 1 tablet (1,000 mg total) by mouth 2 (two) times daily with a meal.   nystatin powder Generic drug: nystatin Apply 1 application topically 3 (three) times daily.   vitamin B-12 1000  MCG tablet Commonly known as: CYANOCOBALAMIN Take 1,000 mcg by mouth daily.            Durable Medical Equipment  (From admission, onward)         Start     Ordered   09/05/19 1519  DME standard manual wheelchair with seat cushion  Once    Comments: Patient suffers from morbid obesity which impairs their ability to perform daily activities like grooming in the home.  A walker will not resolve issue with performing activities of daily living. A wheelchair will allow patient to safely perform daily activities. Patient can safely propel the wheelchair in the home or has a caregiver who can provide assistance. Length of need 6 months . Accessories: elevating leg rests (ELRs), wheel locks, extensions and anti-tippers.   09/05/19 1522          Disposition and follow-up:   Mr.Cordale A Hiemstra was discharged from [facility] in Stable condition.  At the hospital follow up visit please address:  1. Urinary Tract Infection - Ensure his symptoms have resolved  2. Alcoholic Cirrhosis - Will need to monitor LFTs - Please advise to continue alcohol cessation - Need f/u with GI  3. Macrocytic Anemia - Check cbc - Advise to continue folate supplementation  4. Second degree type 1 heart block - Ensure he f/u with cardiology  5. Incidental Liver lesion - Will need dedicated liver imaging and f/u with  GI if appropriate  6. Incidental Pulmonary nodule  - Need dedicated chest CTin 6-12 months and f/u with repeat imaging if necessary  2.  Labs / imaging needed at time of follow-up: cbc, cmp  3.  Pending labs/ test needing follow-up: N/A  Follow-up Appointments: Follow-up Information    [name], MD Follow up.   Specialty: Cardiology Why: Hospital follow-up scheduled for 10/10/2019 at 12:40pm. Please arrive 15 minutes early for check-in. If this date/time does not work for your, please call our office to reschedule.  Contact information: [address] [place] [address] [phone]        [facility]. Call.   Why: We will call you with the appointment time Contact information: [address] [place] [facility] Course by Problem List:  1. Nausea and Vomiting 2/2 UTI Mr.Copenhaver is a 54 yo M w/ PMH of alcohol use disorder and morbid obesity who presented w/ intractable nausea and vomiting. He was found to have bacteuria with leukocytes with lower abdominal pain and was started on ceftriaxone. He was treated for 5 days with quick resolution of his symptoms. Urine culture grew lactobacillus which was thought to be contaminant. Discharged to SNF due to inability to care for himself at home.  2. AV Block: Second Degree, Mobitz I (Wenckebach):  Found to have new 2nd degree heart block. Evaluated by cardiology during admission. Advised to f/u outpatient and outpatient appointment set up.   3. Macrocytic Anemia:  Noted to have stable anemia at 7.4 with MCV of 101.7. B12 was checked and found to be normal. Due to history of alcohol abuse, assumed to be due to folate deficiency. No evidence of bleed during inpatient stay. Discharged w/ recommendation to c/w folate supplementation.  3. 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 No complications noted during admission. Advised to follow up with outpatient PCP.  4.  Incidental Hepatic nodule: Need dedicated multiphase imaging to more accurately assess 2x2.3cm mass found incidentally on CT abdomen.  5.  Incidental Pulmonary nodules: Noted to have 8m pulm nodule in left lung base. Non-contrast chest CT in 6-12 months recommended per radiology.  Discharge Vitals:   BP 124/78 (BP Location: Left Arm)   Pulse 92   Temp 98.2 F (36.8 C) (Oral)   Resp 18   Ht 5' 10"  (1.778 m)   Wt (!) 156.5 kg   SpO2 100%   BMI 49.50 kg/m   Pertinent Labs,  Studies, and Procedures:  CMP Latest Ref Rng & Units 09/05/2019 09/04/2019 09/03/2019  Glucose 70 - 99 mg/dL 273(H) 230(H) 157(H)  BUN 6 - 20 mg/dL 16 14 12   Creatinine 0.61 - 1.24 mg/dL 1.18 1.14 1.06  Sodium 135 - 145 mmol/L 130(L) 129(L) 133(L)  Potassium 3.5 - 5.1 mmol/L 3.6 3.6 3.9  Chloride 98 - 111 mmol/L 94(L) 95(L) 96(L)  CO2 22 - 32 mmol/L 25 26 23   Calcium 8.9 - 10.3 mg/dL 8.7(L) 8.6(L) 8.8(L)  Total Protein 6.5 - 8.1 g/dL 7.1 7.0 6.8  Total Bilirubin 0.3 - 1.2 mg/dL 0.8 0.8 0.8  Alkaline Phos 38 - 126 U/L 79 76 63  AST 15 - 41 U/L 24 24 21   ALT 0 - 44 U/L 11 11 11    CBC Latest Ref Rng & Units 09/05/2019 09/04/2019 09/03/2019  WBC 4.0 - 10.5 K/uL 5.1  5.3 4.4  Hemoglobin 13.0 - 17.0 g/dL 7.4(L) 7.1(L) 7.3(L)  Hematocrit 39.0 - 52.0 % 23.4(L) 21.4(L) 22.5(L)  Platelets 150 - 400 K/uL 266 187 199   CT ABD/Pelvis W/O Contrast FINDINGS: Lower chest: Nodule in the left lung base 8 mm. Calcified coronary artery disease. Normal heart size. No pericardial effusion.  Hepatobiliary: Marked hepatic steatosis with nodular morphology. Lesion in the posterior right hemi liver measures 2 x 2.3 cm less dense than surrounding fatty changes.  Abundant gallstones in the gallbladder. No pericholecystic stranding. Gallbladder distension is moderate. No gross biliary ductal dilation.  Pancreas: Pancreas is normal without focal lesion or ductal dilation. No peripancreatic inflammation.  Spleen: Spleen is normal size without focal lesion.  Adrenals/Urinary Tract: Adrenal glands are normal.  No nephrolithiasis or hydronephrosis. Bilateral perinephric stranding without focal fluid on noncontrast imaging.  Stomach/Bowel: Small hiatal hernia. Small bowel is normal caliber. Post appendectomy. Suggestion of mural stratification of the ascending colon. Colon is under distended limiting assessment. Stool throughout the remainder of the colon.  Vascular/Lymphatic: Scattered  atherosclerosis of the abdominal aorta. No adenopathy in the upper abdomen or in the retroperitoneum.  No pelvic lymphadenopathy.  Reproductive: Prostate unremarkable by CT.  Other: No free air. No ascites. Fat containing umbilical hernia.  Musculoskeletal: Spinal degenerative changes. Degenerative changes in the bilateral hips. No acute bone finding or destructive bone process.  IMPRESSION: 1. No nephrolithiasis or hydronephrosis. No findings to explain hematuria on noncontrast imaging. Bilateral perinephric stranding could be seen in the setting of urinary tract infection but is nonspecific. Consider follow-up imaging utilizing hematuria protocol on follow-up when appropriate. 2. Marked hepatic steatosis with nodular morphology. Lesion in the posterior right hemi liver measures 2 x 2.3 cm less dense than surrounding fatty changes. This is of uncertain significance but the patient appears to have findings of liver disease and potentially early cirrhosis. Would suggest follow-up multiphase imaging of the liver on an outpatient basis to further assess for underlying liver lesions. In the meantime correlation with clinical and laboratory studies to assess for clinical signs of liver disease is suggested. 3. Gallbladder distension without pericholecystic stranding likely with extensive sludge and/or stones in the gallbladder. 4. Suggestion of mural stratification of the ascending colon. This could be due to underdistention, mild colitis or more likely prior colitis. 5. 8 mm pulmonary nodule in the left lung base. Non-contrast chest CT at 6-12 months is recommended. If the nodule is stable at time of repeat CT, then future CT at 18-24 months (from today's scan) is considered optional for low-risk patients, but is recommended for high-risk patients. This recommendation follows the consensus statement: Guidelines for Management of Incidental Pulmonary Nodules Detected on CT  Images: From the Fleischner Society 2017; Radiology 2017; 284:228-243. 6. Fat containing umbilical hernia. 7. Aortic atherosclerosis.  Discharge Instructions: Discharge Instructions    Call MD for:  difficulty breathing, headache or visual disturbances   Complete by: As directed    Call MD for:  persistant nausea and vomiting   Complete by: As directed    Call MD for:  redness, tenderness, or signs of infection (pain, swelling, redness, odor or green/yellow discharge around incision site)   Complete by: As directed    Call MD for:  severe uncontrolled pain   Complete by: As directed    Diet - low sodium heart healthy   Complete by: As directed    Discharge instructions   Complete by: As directed    Dear [name]  You  came to [place] with nausea, vomiting. We have determined this was caused by urinary tract infection. Here are our recommendations for you at discharge:  Please take your home meds as prescribed Please avoid alcohol use Please follow up with your cardiologist and your clinic.  Thank you for choosing [facility]   Increase activity slowly   Complete by: As directed      Signed: Mosetta Anis, MD 09/05/2019, 3:37 PM   Pager: [phone]

## 2019-09-05 NOTE — Progress Notes (Signed)
Physical Therapy Treatment Patient Details Name: Allen Gould MRN: [id number] DOB: Aug 08, 1965 Today's Date: 09/05/2019    History of Present Illness Pt is a 54 y/o male with PMH of T2DM, asthma, HTN, morbid obesity, alcohol use who presents for nausea and vomiting x 4 days, epigastric pain, neuropathic pain in feet, scrotal rash and dysuria. ECG in ED revealed 2nd degree AV block.     PT Comments    Pt was seen for mobility with walker to chair and worked on STS from chair with pt being able to get up successfully with forward propulsion.  He is agitated, at one point takes his walker and bangs it down several times.  Talked with him about the limits he has being where he is, and that rehab can only work to build on them.  Follow acutely for these needs, SNF to follow for completion of gait and transfer skills to get pt home again.     Follow Up Recommendations  SNF     Equipment Recommendations  Wheelchair (measurements PT)    Recommendations for Other Services       Precautions / Restrictions Precautions Precautions: Fall Precaution Comments: uses build up on chair Restrictions Weight Bearing Restrictions: No    Mobility  Bed Mobility Overal bed mobility: Needs Assistance Bed Mobility: Supine to Sit     Supine to sit: Min assist;HOB elevated     General bed mobility comments: min to lift trunk from bed due to limited effort by pt  Transfers Overall transfer level: Needs assistance Equipment used: Rolling walker (2 wheeled) Transfers: Sit to/from Stand Sit to Stand: +2 physical assistance;+2 safety/equipment;Min assist Stand pivot transfers: Mod assist;+2 physical assistance;+2 safety/equipment       General transfer comment: mod to pivot due to pt using walker with poor control of hands on grips  Ambulation/Gait             General Gait Details: steps to transfer to chair   Stairs             Wheelchair Mobility    Modified Rankin (Stroke  Patients Only)       Balance Overall balance assessment: Needs assistance Sitting-balance support: Feet supported Sitting balance-Leahy Scale: Good     Standing balance support: Bilateral upper extremity supported;During functional activity Standing balance-Leahy Scale: Poor Standing balance comment: using awkward control of walker                            Cognition Arousal/Alertness: Awake/alert Behavior During Therapy: Flat affect Overall Cognitive Status: No family/caregiver present to determine baseline cognitive functioning                                 General Comments: had moment of agitation and was fairly negative about his expectations      Exercises      General Comments General comments (skin integrity, edema, etc.): low effort in general with all mobility and negative about his expectations      Pertinent Vitals/Pain Pain Assessment: Faces Faces Pain Scale: Hurts a little bit Pain Location: generalized Pain Descriptors / Indicators: Guarding Pain Intervention(s): Limited activity within patient's tolerance;Monitored during session;Premedicated before session;Repositioned    Home Living                      Prior Function  PT Goals (current goals can now be found in the care plan section) Acute Rehab PT Goals Patient Stated Goal: go to rehab    Frequency    Min 3X/week      PT Plan Current plan remains appropriate    Co-evaluation PT/OT/SLP Co-Evaluation/Treatment: Yes Reason for Co-Treatment: For patient/therapist safety;To address functional/ADL transfers PT goals addressed during session: Mobility/safety with mobility;Balance OT goals addressed during session: ADL's and self-care      AM-PAC PT "6 Clicks" Mobility   Outcome Measure  Help needed turning from your back to your side while in a flat bed without using bedrails?: A Little Help needed moving from lying on your back to sitting  on the side of a flat bed without using bedrails?: A Little Help needed moving to and from a bed to a chair (including a wheelchair)?: A Little Help needed standing up from a chair using your arms (e.g., wheelchair or bedside chair)?: A Lot Help needed to walk in hospital room?: A Lot Help needed climbing 3-5 steps with a railing? : Total 6 Click Score: 14    End of Session Equipment Utilized During Treatment: Gait belt Activity Tolerance: Patient limited by fatigue Patient left: in chair;with call bell/phone within reach;with chair alarm set Nurse Communication: Mobility status PT Visit Diagnosis: Unsteadiness on feet (R26.81);Muscle weakness (generalized) (M62.81);Difficulty in walking, not elsewhere classified (R26.2);Pain Pain - part of body: Ankle and joints of foot     Time: 2876-8115 PT Time Calculation (min) (ACUTE ONLY): 23 min  Charges:  $Therapeutic Activity: 8-22 mins                    Ramond Dial 09/05/2019, 2:10 PM  Mee Hives, PT MS Acute Rehab Dept. Number: [place] and [place]

## 2019-09-05 NOTE — Progress Notes (Signed)
Occupational Therapy Treatment Patient Details Name: RAMI BUDHU MRN: [id number] DOB: 1966/04/25 Today's Date: 09/05/2019    History of present illness Pt is a 54 y/o male with PMH of T2DM, asthma, HTN, morbid obesity, alcohol use who presents for nausea and vomiting x 4 days, epigastric pain, neuropathic pain in feet, scrotal rash and dysuria. ECG in ED revealed 2nd degree AV block.    OT comments  Patient agreeable to OT/PT cotx.  Completing bed mobility with min assist, transfers using RW with min assist +2.  Continues to require total assist for LB ADLS.  Patient educated on transfer techniques, role of therapy, and importance of getting OOB, voiced understanding but is easily agitated/frustrated with suggestions during transfers to increase ease. Self limiting at times, and voices preference to his lift chair (which is at home). WiIl follow.    Follow Up Recommendations  SNF;Home health OT;Other (comment);Supervision/Assistance - 24 hour(or maximal HH services, with OT and aide )    Equipment Recommendations  None recommended by OT    Recommendations for Other Services      Precautions / Restrictions Precautions Precautions: Fall Restrictions Weight Bearing Restrictions: No       Mobility Bed Mobility Overal bed mobility: Needs Assistance Bed Mobility: Supine to Sit     Supine to sit: Min assist;HOB elevated     General bed mobility comments: min assist for trunk support to ascend, increased time to manage LBs   Transfers Overall transfer level: Needs assistance Equipment used: Rolling walker (2 wheeled) Transfers: Sit to/from Stand Sit to Stand: Min assist;+2 physical assistance;+2 safety/equipment;From elevated surface         General transfer comment: min assist to power up and steady from EOB, cueing for hand placement, use of momentum and increased forward lean; increased time and effort to take several steps to recliner; became agitated with suggestion  for increased forward lean     Balance Overall balance assessment: Needs assistance Sitting-balance support: No upper extremity supported;Feet supported Sitting balance-Leahy Scale: Good     Standing balance support: Bilateral upper extremity supported;During functional activity Standing balance-Leahy Scale: Poor Standing balance comment: heavily reliant on UE support                           ADL either performed or assessed with clinical judgement   ADL Overall ADL's : Needs assistance/impaired                     Lower Body Dressing: Total assistance;+2 for physical assistance;Sit to/from stand   Toilet Transfer: Minimal assistance;+2 for physical assistance;+2 for safety/equipment;Ambulation;RW Toilet Transfer Details (indicate cue type and reason): simulated to recliner, using RW          Functional mobility during ADLs: +2 for safety/equipment;Minimal assistance;+2 for physical assistance;Rolling walker;Cueing for safety General ADL Comments: pt remains limited by weakness, decreased activity tolerance and impaired balance      Vision       Perception     Praxis      Cognition Arousal/Alertness: Awake/alert Behavior During Therapy: Flat affect Overall Cognitive Status: No family/caregiver present to determine baseline cognitive functioning                                 General Comments: easily agitated, somewhat self limiting and poor understanding of rehab process        Exercises  Shoulder Instructions       General Comments educated on UE pushup exercises for strengthening and transfer ease, pt reports not interested in trying right now     Pertinent Vitals/ Pain       Pain Assessment: Faces Faces Pain Scale: Hurts a little bit Pain Location: generalized Pain Descriptors / Indicators: Discomfort Pain Intervention(s): Limited activity within patient's tolerance;Monitored during session;Repositioned  Home  Living                                          Prior Functioning/Environment              Frequency  Min 2X/week        Progress Toward Goals  OT Goals(current goals can now be found in the care plan section)  Progress towards OT goals: Progressing toward goals  Acute Rehab OT Goals Patient Stated Goal: go to rehab OT Goal Formulation: With patient  Plan Discharge plan remains appropriate;Frequency remains appropriate    Co-evaluation    PT/OT/SLP Co-Evaluation/Treatment: Yes Reason for Co-Treatment: Necessary to address cognition/behavior during functional activity   OT goals addressed during session: ADL's and self-care      AM-PAC OT "6 Clicks" Daily Activity     Outcome Measure   Help from another person eating meals?: None Help from another person taking care of personal grooming?: A Little Help from another person toileting, which includes using toliet, bedpan, or urinal?: A Lot Help from another person bathing (including washing, rinsing, drying)?: A Lot Help from another person to put on and taking off regular upper body clothing?: A Little Help from another person to put on and taking off regular lower body clothing?: Total 6 Click Score: 15    End of Session Equipment Utilized During Treatment: Rolling walker;Gait belt  OT Visit Diagnosis: Other abnormalities of gait and mobility (R26.89);Muscle weakness (generalized) (M62.81);Pain   Activity Tolerance Treatment limited secondary to agitation   Patient Left in chair;with call bell/phone within reach   Nurse Communication Mobility status        Time: 7741-4239 OT Time Calculation (min): 23 min  Charges: OT General Charges $OT Visit: 1 Visit OT Treatments $Self Care/Home Management : 8-22 mins  Jolaine Artist, [place] Pager [phone] Office [phone]    Delight Stare 09/05/2019, 12:46 PM

## 2019-09-05 NOTE — Progress Notes (Signed)
Subjective: Patient examined and evaluated at bedside. Allen Gould was resting comfortably in bed upon approach. He reports marked improvement in R hand swelling noted yesterday with no pain today. No episodes of nausea, vomiting, abdominal pain, dysuria, and pain in groin from rash. Discussed plan for last day of CTX today as well as dispo plan for SNF verus home health pending approval and patient is agreeable. Plan for discharge today.  Objective:  Vital signs in last 24 hours: Vitals:   09/04/19 0529 09/04/19 1317 09/04/19 2119 09/05/19 0336  BP: 96/67 97/64 127/89 119/88  Pulse: 66 77 96 90  Resp: 16 18 18 18   Temp: 97.7 F (36.5 C) 98.8 F (37.1 C) 98.5 F (36.9 C) 98.1 F (36.7 C)  TempSrc: Oral Oral Oral Oral  SpO2: 100% 99% 98% 91%  Weight:      Height:       Weight change:   Intake/Output Summary (Last 24 hours) at 09/05/2019 1005 Last data filed at 09/05/2019 0400 Gross per 24 hour  Intake 820 ml  Output 800 ml  Net 20 ml   General appearance: alert, cooperative, appears stated age, no distress and morbidly obese Head: Normocephalic, without obvious abnormality, atraumatic Lungs: clear to auscultation bilaterally Heart: regular rate and rhythm, S1, S2 normal, no murmur, click, rub or gallop Abdomen: obese habitus limits exam, bowel sounds normal, non-tender throughout Extremities: No peripheral edema Skin: Continued interval improvement in groin rash now with minimal erythema and no tenderness or purulence. Otherwise, skin warm and dry with no rashes noted. Neurologic: Grossly normal Psych: Mood and affect normal and congruent   Assessment/Plan:  Active Problems:   Acute kidney injury (HCC)   Pressure injury of skin   Second degree AV block, Mobitz type I   Bifascicular block   PAC (premature atrial contraction)   PVCs (premature ventricular contractions)   PAF (paroxysmal atrial fibrillation) ([place])  Summary: [name] is a 54 year old man with  a past medical history of T2DM, asthma, PAF, HTN, and morbid obesity who presents for new heart block and intractable nausea/vomiting.  # Nausea and Vomiting 2/2 Biliary Colic vs. UTI vs. Cellulitis vs. Constipation: Patient remains improved with no nausea, vomiting, abdominal pain, or dysuria. Upon inspection, cellulitis in groin shows continued improvement with minimal erythema and now no tenderness. No purulence noted. Plan for discharge today after final dose of IV abx. -CTX, day 5/5 for uncomplicated UTI and cellulitis -Continue home nystatin powder for ppx of intertriginous area rashes -Continue Miralax PRN for constipation -Continue Zofran PRN for nausea  # 2nd Degree AV Block: Patient has been evaluated by Cariology. Patient denies any syncope, dizziness, or weakness. BP has remained well-controlled with no significant bradycardia or pauses. He also has a history of PAF/atrial flutter. We appreciate the evaluation and recommendations of our Cardiology colleagues. -No indication for PPM at this time -Currently on full dose Lovenox, as below. Per Cardiology, recommend avoiding anticoagulation; consider re-initiation as an outpatient once he demonstrates compliance with medications and follow-up with avoidance of alcohol.  -Continue ASA 325 mg daily -Continue to monitor on tele until discharge today -Plan for discharge today, counsel patient about the importance of close outpatient follow up as well as continued abstinence from alcohol.  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 CT abd/pelvis with evidence for nodularity with patient history of NAFLD. -Thiamine and  folate, as below  # GERD: History and presenting symptoms consistent with GERD. -Continue famotidine  # AKI, Resolved: -Avoid nephrotoxic agents  # Macrocytic Anemia: Hemoglobin of 8.5 on  admission. Trending up from 7.1 to 7.4 over the past day. B12 2055 on 4/11 after starting oral B12 4/10.  -Continue Folate and B12 supplementation as outpatient -Recommend outpatient monitoring, abstinence from alcohol, and appropriate nutrition  Hepatic Lesion Incidental finding on CT. Need dedicated imaging to f/u -F/u outpatient for multiphase imaging  Pulmonary Nodules Incidental finding on CT. Need repeat imaging in 6-12 months -F/u outpatient for non-contrast CT in 6-12 months  Chronic Medical Problems: Hypertension: BP well-controlled during admission. Hold home meds T2DM: Home metformin held in context of AKI. Random BG elevated to 230-273 over the past day. Plan to restart home regimen at discharge today, but will consider adding to inpatient regimen if pt remains admitted overnight. 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  PT/OT: Recommend SNF, 24-hour assistance Diet: Carb Smart/Heart Healthy Diet, as tolerated DVT ppx: Lovenox 75 mg SQ daily  Dispo: -Social Work has been consulted and arranging appropriate placement PT/OT: Recommendations as above -Anticipated discharge today pending SNF approval versus home health       LOS: 4 days   Landry Mellow, Medical Student 09/05/2019, 10:05 AM

## 2019-09-05 NOTE — TOC Transition Note (Signed)
Transition of Care [facility]) - CM/SW Discharge Note   Patient Details  Name: Allen Gould MRN: [id number] Date of Birth: [birth year]  [facility]) CM/SW Contact:  Alexander Mt, LCSW Phone Number: 09/05/2019, 4:00 PM   Clinical Narrative:    Pt accepted at [facility], pt cleared for discharge. Pt accepting of bed and will call family. All paperwork complete, LOG form to be sent as approved by leadership to [facility] staff. PTAR papers and signed DNR on chart.    Final next level of care: Skilled Nursing Facility Barriers to Discharge: Barriers Resolved   Patient Goals and CMS Choice Patient states their goals for this hospitalization and ongoing recovery are:: go to SNF CMS Medicare.gov Compare Post Acute Care list provided to:: Patient Choice offered to / list presented to : Patient  Discharge Placement   Existing PASRR number confirmed : 09/04/19            Patient to be transferred to facility by: [place] Name of family member notified: pt will notify family Patient and family notified of of transfer: 09/05/19  Discharge Plan and Services In-house Referral: Clinical Social Work Discharge Planning Services: CM Consult Post Acute Care Choice: [place], [place]            Readmission Risk Interventions Readmission Risk Prevention Plan 09/03/2019  Post Dischage Appt Not Complete  Appt Comments pt states he switched his Medicaid to [place], was seeing [facility]  Medication Screening Complete  Transportation Screening Complete  Some recent data might be hidden

## 2019-09-05 NOTE — Discharge Instructions (Signed)
Urinary Tract Infection, Adult A urinary tract infection (UTI) is an infection of any part of the urinary tract. The urinary tract includes:  The kidneys.  The ureters.  The bladder.  The urethra. These organs make, store, and get rid of pee (urine) in the body. What are the causes? This is caused by germs (bacteria) in your genital area. These germs grow and cause swelling (inflammation) of your urinary tract. What increases the risk? You are more likely to develop this condition if:  You have a small, thin tube (catheter) to drain pee.  You cannot control when you pee or poop (incontinence).  You are male, and: ? You use these methods to prevent pregnancy:  A medicine that kills sperm (spermicide).  A device that blocks sperm (diaphragm). ? You have low levels of a male hormone (estrogen). ? You are pregnant.  You have genes that add to your risk.  You are sexually active.  You take antibiotic medicines.  You have trouble peeing because of: ? A prostate that is bigger than normal, if you are male. ? A blockage in the part of your body that drains pee from the bladder (urethra). ? A kidney stone. ? A nerve condition that affects your bladder (neurogenic bladder). ? Not getting enough to drink. ? Not peeing often enough.  You have other conditions, such as: ? Diabetes. ? A weak disease-fighting system (immune system). ? Sickle cell disease. ? Gout. ? Injury of the spine. What are the signs or symptoms? Symptoms of this condition include:  Needing to pee right away (urgently).  Peeing often.  Peeing small amounts often.  Pain or burning when peeing.  Blood in the pee.  Pee that smells bad or not like normal.  Trouble peeing.  Pee that is cloudy.  Fluid coming from the vagina, if you are male.  Pain in the belly or lower back. Other symptoms include:  Throwing up (vomiting).  No urge to eat.  Feeling mixed up (confused).  Being tired  and grouchy (irritable).  A fever.  Watery poop (diarrhea). How is this treated? This condition may be treated with:  Antibiotic medicine.  Other medicines.  Drinking enough water. Follow these instructions at home:  Medicines  Take over-the-counter and prescription medicines only as told by your doctor.  If you were prescribed an antibiotic medicine, take it as told by your doctor. Do not stop taking it even if you start to feel better. General instructions  Make sure you: ? Pee until your bladder is empty. ? Do not hold pee for a long time. ? Empty your bladder after sex. ? Wipe from front to back after pooping if you are a male. Use each tissue one time when you wipe.  Drink enough fluid to keep your pee pale yellow.  Keep all follow-up visits as told by your doctor. This is important. Contact a doctor if:  You do not get better after 1-2 days.  Your symptoms go away and then come back. Get help right away if:  You have very bad back pain.  You have very bad pain in your lower belly.  You have a fever.  You are sick to your stomach (nauseous).  You are throwing up. Summary  A urinary tract infection (UTI) is an infection of any part of the urinary tract.  This condition is caused by germs in your genital area.  There are many risk factors for a UTI. These include having a small, thin   tube to drain pee and not being able to control when you pee or poop.  Treatment includes antibiotic medicines for germs.  Drink enough fluid to keep your pee pale yellow. This information is not intended to replace advice given to you by your health care provider. Make sure you discuss any questions you have with your health care provider. Document Revised: 04/27/2018 Document Reviewed: 11/17/2017 Elsevier Patient Education  2020 Elsevier Inc.  

## 2019-09-05 NOTE — Social Work (Signed)
Clinical Social Worker facilitated patient discharge including contacting patient family and facility to confirm patient discharge plans.  Clinical information faxed to facility and family agreeable with plan.  [name] arranged ambulance transport via [facility] to [facility]  RN to call [phone]  with report prior to discharge.  Clinical Social Worker will sign off for now as social work intervention is no longer needed. Please consult [place] again if new need arises.  Westley Hummer, MSW, LCSW Clinical Social Worker

## 2019-09-06 LAB — CULTURE, BLOOD (ROUTINE X 2)
Culture: NO GROWTH
Culture: NO GROWTH

## 2019-09-14 ENCOUNTER — Ambulatory Visit: Payer: Medicaid Other

## 2019-09-26 ENCOUNTER — Encounter (HOSPITAL_COMMUNITY): Payer: Self-pay | Admitting: Emergency Medicine

## 2019-09-26 ENCOUNTER — Emergency Department (HOSPITAL_COMMUNITY): Payer: Medicaid Other

## 2019-09-26 ENCOUNTER — Other Ambulatory Visit: Payer: Self-pay

## 2019-09-26 ENCOUNTER — Emergency Department (HOSPITAL_COMMUNITY)
Admission: EM | Admit: 2019-09-26 | Discharge: 2019-09-26 | Disposition: A | Discharge status: Skilled Nursing Facility | Payer: Medicaid Other | Attending: Emergency Medicine | Admitting: Emergency Medicine

## 2019-09-26 DIAGNOSIS — J45909 Unspecified asthma, uncomplicated: Secondary | ICD-10-CM | POA: Insufficient documentation

## 2019-09-26 DIAGNOSIS — Z79899 Other long term (current) drug therapy: Secondary | ICD-10-CM | POA: Insufficient documentation

## 2019-09-26 DIAGNOSIS — I4891 Unspecified atrial fibrillation: Secondary | ICD-10-CM | POA: Insufficient documentation

## 2019-09-26 DIAGNOSIS — I10 Essential (primary) hypertension: Secondary | ICD-10-CM | POA: Diagnosis not present

## 2019-09-26 DIAGNOSIS — R0789 Other chest pain: Secondary | ICD-10-CM | POA: Insufficient documentation

## 2019-09-26 DIAGNOSIS — Z794 Long term (current) use of insulin: Secondary | ICD-10-CM | POA: Diagnosis not present

## 2019-09-26 DIAGNOSIS — E119 Type 2 diabetes mellitus without complications: Secondary | ICD-10-CM | POA: Diagnosis not present

## 2019-09-26 DIAGNOSIS — R079 Chest pain, unspecified: Secondary | ICD-10-CM

## 2019-09-26 LAB — BASIC METABOLIC PANEL
Anion gap: 8 (ref 5–15)
BUN: 9 mg/dL (ref 6–20)
CO2: 24 mmol/L (ref 22–32)
Calcium: 7.9 mg/dL — ABNORMAL LOW (ref 8.9–10.3)
Chloride: 100 mmol/L (ref 98–111)
Creatinine, Ser: 0.84 mg/dL (ref 0.61–1.24)
GFR calc Af Amer: >60 mL/min (ref >60)
GFR calc non Af Amer: >60 mL/min (ref >60)
Glucose, Bld: 91 mg/dL (ref 70–99)
Potassium: 4.1 mmol/L (ref 3.5–5.1)
Sodium: 132 mmol/L — ABNORMAL LOW (ref 135–145)

## 2019-09-26 LAB — CBC
HCT: 27.9 % — ABNORMAL LOW (ref 39.0–52.0)
Hemoglobin: 8.6 g/dL — ABNORMAL LOW (ref 13.0–17.0)
MCH: 31.2 pg (ref 26.0–34.0)
MCHC: 30.8 g/dL (ref 30.0–36.0)
MCV: 101.1 fL — ABNORMAL HIGH (ref 80.0–100.0)
Platelets: 400 10*3/uL (ref 150–400)
RBC: 2.76 MIL/uL — ABNORMAL LOW (ref 4.22–5.81)
RDW: 14.7 % (ref 11.5–15.5)
WBC: 8.7 10*3/uL (ref 4.0–10.5)
nRBC: 0 % (ref 0.0–0.2)

## 2019-09-26 LAB — TROPONIN I (HIGH SENSITIVITY)
Troponin I (High Sensitivity): 8 ng/L (ref <18)
Troponin I (High Sensitivity): 7 ng/L (ref <18)

## 2019-09-26 NOTE — ED Provider Notes (Signed)
[facility] EMERGENCY DEPARTMENT Provider Note   CSN: [id number] Arrival date & time: 09/26/19  1009     History Chief Complaint  Patient presents with  . Chest Pain    Allen Gould is a 54 y.o. male.  HPI Patient presents with left-sided chest pain.  Comes from nursing home.  States woke up around 4 in the morning with it.  Worse with movements.  Worse with certain positions.  No difficulty breathing.  No cough.  Not had any exertional pain.  No injury.  States yesterday he did do his physical therapy without difficulty.  No swelling his legs.  States his previous leg swelling is much better.  Does have a history of atrial fibrillation.  Not on anticoagulation.  The pain is sharp and on his left anterior chest.  Does go to the back at times.    Past Medical History:  Diagnosis Date  . Asthma   . Diabetes mellitus without complication ([place])   . GERD (gastroesophageal reflux disease)   . Hypertension   . Sleep apnea    uses cpap    Patient Active Problem List   Diagnosis Date Noted  . Pressure injury of skin 09/02/2019  . Second degree AV block, Mobitz type I   . Bifascicular block   . PAC (premature atrial contraction)   . PVCs (premature ventricular contractions)   . PAF (paroxysmal atrial fibrillation) ([place])   . Acute kidney injury ([place]) 09/01/2019  . Sepsis ([place]) 05/29/2017  . Hypomagnesemia 05/29/2017  . Odontogenic infection of jaw 05/29/2017  . Near syncope 05/29/2017  . Hypertensive urgency 05/29/2017  . Cough 08/09/2014  . Balanitis 08/09/2014  . Financial difficulties 08/09/2014  . Health care maintenance 03/01/2014  . Dyslipidemia 03/01/2014  . Morbid obesity ([place]) 08/29/2013  . Right shoulder pain 08/29/2013  . Insomnia 08/29/2013  . DM (diabetes mellitus) type 2, uncontrolled, without ketoacidosis 08/10/2013  . Hypertension   . Asthma   . GERD (gastroesophageal reflux disease)     Past Surgical History:  Procedure Laterality Date  .  APPENDECTOMY         Family History  Problem Relation Age of Onset  . Kidney disease Mother   . Diabetes Sister   . Hyperlipidemia Sister   . Heart disease Other     Social History   Tobacco Use  . Smoking status: Former Smoker    Types: Cigarettes  . Smokeless tobacco: Never Used  . Tobacco comment: 1 cigerette a dayb 2-3 cigerettes per week  Substance Use Topics  . Alcohol use: Yes    Alcohol/week: 2.0 standard drinks    Types: 2 Cans of beer per week    Comment: every other day  . Drug use: No    Home Medications Prior to Admission medications   Medication Sig Start Date End Date Taking? Authorizing Provider  amLODipine (NORVASC) 10 MG tablet Take 10 mg by mouth daily.   Yes [provider]  aspirin 325 MG tablet Take 325 mg by mouth daily as needed for moderate pain.   Yes [provider]  atorvastatin (LIPITOR) 40 MG tablet Take 1 tablet (40 mg total) by mouth daily at 6 PM. 05/31/17  Yes Nita Sells, MD  carvedilol (COREG) 12.5 MG tablet Take 12.5 mg by mouth daily.    Yes [provider]  diclofenac Sodium (VOLTAREN) 1 % GEL Apply 2 g topically 4 (four) times daily.   Yes [provider]  famotidine (PEPCID) 10 MG  tablet Take 1 tablet (10 mg total) by mouth 2 (two) times daily. Patient taking differently: Take 20 mg by mouth daily.  09/05/19  Yes Mosetta Anis, MD  folic acid (FOLVITE) 1 MG tablet Take 1 tablet (1 mg total) by mouth daily. 09/05/19 09/04/20 Yes Mosetta Anis, MD  gabapentin (NEURONTIN) 300 MG capsule Take 1 capsule by mouth in the morning, at noon, and at bedtime. 12/13/18  Yes [provider]  Glucagon HCl (GLUCAGON EMERGENCY) 1 MG/ML SOLR Inject 1 mg as directed as needed. Give for cbg less than 60 and unable to consume glucose replacment   Yes [provider]  insulin aspart protamine- aspart (NOVOLOG MIX 70/30) (70-30) 100 UNIT/ML injection Inject 0.2 mLs (20 Units total) into the skin 2  (two) times daily with a meal. 05/31/17  Yes Samtani, Jai-Gurmukh, MD  nystatin (NYSTATIN) powder Apply 1 application topically 3 (three) times daily.   Yes [provider]  vitamin B-12 (CYANOCOBALAMIN) 1000 MCG tablet Take 1,000 mcg by mouth daily.   Yes [provider]  lisinopril (PRINIVIL,ZESTRIL) 20 MG tablet Take 1 tablet (20 mg total) by mouth daily. Patient not taking: Reported on 09/01/2019 05/31/17   Nita Sells, MD  metFORMIN (GLUCOPHAGE) 1000 MG tablet Take 1 tablet (1,000 mg total) by mouth 2 (two) times daily with a meal. Patient not taking: Reported on 09/01/2019 05/31/17   Nita Sells, MD    Allergies    Shellfish allergy  Review of Systems   Review of Systems  Constitutional: Negative for appetite change.  HENT: Negative for congestion.   Respiratory: Negative for cough and shortness of breath.   Cardiovascular: Positive for chest pain. Negative for palpitations and leg swelling.  Gastrointestinal: Negative for abdominal pain.  Genitourinary: Negative for flank pain.  Musculoskeletal: Negative for back pain.  Neurological: Positive for weakness.  Psychiatric/Behavioral: Negative for confusion.    Physical Exam Updated Vital Signs BP 127/74 (BP Location: Right Arm)   Pulse 78   Temp 98 F (36.7 C) (Oral)   Resp 11   Ht 5' 10"  (1.778 m)   Wt (!) 140.2 kg   SpO2 100%   BMI 44.34 kg/m   Physical Exam Vitals and nursing note reviewed.  HENT:     Head: Normocephalic.  Cardiovascular:     Rate and Rhythm: Normal rate and regular rhythm.  Pulmonary:     Breath sounds: No wheezing, rhonchi or rales.  Chest:     Comments: Tenderness to left anterior chest wall without rash crepitance or deformity. Musculoskeletal:     Right lower leg: No edema.     Left lower leg: No edema.  Skin:    Capillary Refill: Capillary refill takes less than 2 seconds.  Neurological:     Mental Status: He is alert and oriented to person, place, and  time.     ED Results / Procedures / Treatments   Labs (all labs ordered are listed, but only abnormal results are displayed) Labs Reviewed  BASIC METABOLIC PANEL - Abnormal; Notable for the following components:      Result Value   Sodium 132 (*)    Calcium 7.9 (*)    All other components within normal limits  CBC - Abnormal; Notable for the following components:   RBC 2.76 (*)    Hemoglobin 8.6 (*)    HCT 27.9 (*)    MCV 101.1 (*)    All other components within normal limits  TROPONIN I (HIGH SENSITIVITY)  TROPONIN I (HIGH SENSITIVITY)    EKG EKG Interpretation  Date/Time:  Wednesday Sep 26 2019 10:10:16 EDT Ventricular Rate:  80 PR Interval:    QRS Duration: 146 QT Interval:  440 QTC Calculation: 508 R Axis:   -83 Text Interpretation: Sinus or ectopic atrial rhythm Prolonged PR interval RBBB and LAFB Confirmed by Davonna Belling [phone]) on 09/26/2019 2:56:21 PM   Radiology DG Chest Portable 1 View  Result Date: 09/26/2019 CLINICAL DATA:  Left chest pain radiating into the left shoulder since 4 a.m. this morning. EXAM: PORTABLE CHEST 1 VIEW COMPARISON:  PA and lateral chest 05/28/2017. FINDINGS: The lungs are clear. Heart size is normal. No pneumothorax or pleural fluid. No acute focal bony abnormality. IMPRESSION: Negative chest. Electronically Signed   By: Inge Rise M.D.   On: 09/26/2019 10:55    Procedures Procedures (including critical care time)  Medications Ordered in ED Medications - No data to display  ED Course  I have reviewed the triage vital signs and the nursing notes.  Pertinent labs & imaging results that were available during my care of the patient were reviewed by me and considered in my medical decision making (see chart for details).    MDM Rules/Calculators/A&P                      Patient with chest pain.  Sharp left-sided worse with certain positions.  EKG reassuring.  Heart pathway score of 3.  Troponin negative x2.  X-ray  reassuring.  Doubt pulmonary embolism.  I think patient stable for discharge home. Final Clinical Impression(s) / ED Diagnoses Final diagnoses:  Nonspecific chest pain    Rx / DC Orders ED Discharge Orders    None       Davonna Belling, MD 09/27/19 [phone]

## 2019-09-26 NOTE — ED Notes (Signed)
ED Provider at bedside. 

## 2019-09-26 NOTE — Discharge Instructions (Addendum)
Follow-up with your doctor for continued pain.

## 2019-09-26 NOTE — ED Triage Notes (Addendum)
Pt from  [facility]. Pt c/o of L sided cp that radiating to his left shoulder since 0400 am this morning. Worsening pain with walking. ASA given by EMS and states EKG showed Right bundle branch block

## 2019-09-26 NOTE — ED Notes (Signed)
Attempted hand off report to [facility].

## 2019-09-26 NOTE — ED Notes (Signed)
ED TO INPATIENT HANDOFF REPORT  ED Nurse Name and Phone #:   S Name/Age/Gender Allen Gould 54 y.o. male Room/Bed: APA07/APA07  Code Status   Code Status: Prior  Home/SNF/Other Skilled nursing facility Patient oriented to: self, place, time and situation Is this baseline? Yes   Triage Complete: Triage complete  Chief Complaint chest pain  Triage Note Pt from  [facility]. Pt c/o of L sided cp that radiating to his left shoulder since 0400 am this morning. Worsening pain with walking. ASA given by EMS and states EKG showed Right bundle branch block    Allergies Allergies  Allergen Reactions  . Shellfish Allergy Hives    Level of Care/Admitting Diagnosis ED Disposition    ED Disposition Condition Comment   Discharge  The patient appears reasonably screened and/or stabilized for discharge and I doubt any other medical condition or other [facility] requiring further screening, evaluation, or treatment in the ED exists or is present at this time prior to discharge.       B Medical/Surgery History Past Medical History:  Diagnosis Date  . Asthma   . Diabetes mellitus without complication ([place])   . GERD (gastroesophageal reflux disease)   . Hypertension   . Sleep apnea    uses cpap   Past Surgical History:  Procedure Laterality Date  . APPENDECTOMY       A IV Location/Drains/Wounds Patient Lines/Drains/Airways Status   Active Line/Drains/Airways    Name:   Placement date:   Placement time:   Site:   Days:   Pressure Injury 09/01/19 Buttocks Stage 2 -  Partial thickness loss of dermis presenting as a shallow open injury with a red, pink wound bed without slough.   09/01/19    2101     25          Intake/Output Last 24 hours No intake or output data in the 24 hours ending 09/26/19 1424  Labs/Imaging Results for orders placed or performed during the hospital encounter of 09/26/19 (from the past 48 hour(s))  Troponin I (High Sensitivity)     Status: None   Collection Time: 09/26/19 11:01 AM  Result Value Ref Range   Troponin I (High Sensitivity) 8 <18 ng/L    Comment: (NOTE) Elevated high sensitivity troponin I (hsTnI) values and significant  changes across serial measurements may suggest ACS but many other  chronic and acute conditions are known to elevate hsTnI results.  Refer to the "Links" section for chest pain algorithms and additional  guidance. Performed at [facility], [address] [place], [place] [address]   Basic metabolic panel     Status: Abnormal   Collection Time: 09/26/19 11:01 AM  Result Value Ref Range   Sodium 132 (L) 135 - 145 mmol/L   Potassium 4.1 3.5 - 5.1 mmol/L   Chloride 100 98 - 111 mmol/L   CO2 24 22 - 32 mmol/L   Glucose, Bld 91 70 - 99 mg/dL    Comment: Glucose reference range applies only to samples taken after fasting for at least 8 hours.   BUN 9 6 - 20 mg/dL   Creatinine, Ser 0.84 0.61 - 1.24 mg/dL   Calcium 7.9 (L) 8.9 - 10.3 mg/dL   GFR calc non Af Amer >60 >60 mL/min   GFR calc Af Amer >60 >60 mL/min   Anion gap 8 5 - 15    Comment: Performed at [facility], [address] [place], [place] [address]  CBC  Status: Abnormal   Collection Time: 09/26/19 11:01 AM  Result Value Ref Range   WBC 8.7 4.0 - 10.5 K/uL   RBC 2.76 (L) 4.22 - 5.81 MIL/uL   Hemoglobin 8.6 (L) 13.0 - 17.0 g/dL   HCT 27.9 (L) 39.0 - 52.0 %   MCV 101.1 (H) 80.0 - 100.0 fL   MCH 31.2 26.0 - 34.0 pg   MCHC 30.8 30.0 - 36.0 g/dL   RDW 14.7 11.5 - 15.5 %   Platelets 400 150 - 400 K/uL   nRBC 0.0 0.0 - 0.2 %    Comment: Performed at [facility], [address] [place], [place] [address]  Troponin I (High Sensitivity)     Status: None   Collection Time: 09/26/19 12:29 PM  Result Value Ref Range   Troponin I (High Sensitivity) 7 <18 ng/L    Comment: (NOTE) Elevated high sensitivity troponin I (hsTnI) values and significant  changes across serial measurements may suggest ACS but many other  chronic and acute  conditions are known to elevate hsTnI results.  Refer to the "Links" section for chest pain algorithms and additional  guidance. Performed at [facility], [address], [place], [place] [address]    DG Chest Portable 1 View  Result Date: 09/26/2019 CLINICAL DATA:  Left chest pain radiating into the left shoulder since 4 a.m. this morning. EXAM: PORTABLE CHEST 1 VIEW COMPARISON:  PA and lateral chest 05/28/2017. FINDINGS: The lungs are clear. Heart size is normal. No pneumothorax or pleural fluid. No acute focal bony abnormality. IMPRESSION: Negative chest. Electronically Signed   By: Inge Rise M.D.   On: 09/26/2019 10:55    Pending Labs Unresulted Labs (From admission, onward)   None      Vitals/Pain Today's Vitals   09/26/19 1345 09/26/19 1400 09/26/19 1412 09/26/19 1415  BP:  (!) 130/94    Pulse:  78    Resp:  15    Temp:      SpO2: 98% 100%  100%  Weight:      Height:      PainSc:   0-No pain     Isolation Precautions No active isolations  Medications Medications - No data to display  Mobility non-ambulatory Moderate fall risk   Focused Assessments    R Recommendations: See Admitting Provider Note  Report given to:   Additional Notes:

## 2019-10-10 ENCOUNTER — Other Ambulatory Visit: Payer: Self-pay

## 2019-10-10 ENCOUNTER — Inpatient Hospital Stay (HOSPITAL_COMMUNITY)
Admission: EM | Admit: 2019-10-10 | Discharge: 2019-10-12 | DRG: 641 | Disposition: A | Discharge status: Skilled Nursing Facility | Payer: Medicaid Other | Attending: Internal Medicine | Admitting: Internal Medicine

## 2019-10-10 ENCOUNTER — Emergency Department (HOSPITAL_COMMUNITY): Payer: Medicaid Other

## 2019-10-10 ENCOUNTER — Encounter (HOSPITAL_COMMUNITY): Payer: Self-pay | Admitting: Emergency Medicine

## 2019-10-10 ENCOUNTER — Ambulatory Visit: Payer: Medicaid Other | Admitting: Cardiology

## 2019-10-10 DIAGNOSIS — Z841 Family history of disorders of kidney and ureter: Secondary | ICD-10-CM

## 2019-10-10 DIAGNOSIS — Z6841 Body Mass Index (BMI) 40.0 and over, adult: Secondary | ICD-10-CM

## 2019-10-10 DIAGNOSIS — K3184 Gastroparesis: Secondary | ICD-10-CM | POA: Diagnosis present

## 2019-10-10 DIAGNOSIS — E878 Other disorders of electrolyte and fluid balance, not elsewhere classified: Secondary | ICD-10-CM | POA: Diagnosis present

## 2019-10-10 DIAGNOSIS — I441 Atrioventricular block, second degree: Secondary | ICD-10-CM | POA: Diagnosis present

## 2019-10-10 DIAGNOSIS — R634 Abnormal weight loss: Secondary | ICD-10-CM | POA: Diagnosis present

## 2019-10-10 DIAGNOSIS — E1143 Type 2 diabetes mellitus with diabetic autonomic (poly)neuropathy: Secondary | ICD-10-CM | POA: Diagnosis present

## 2019-10-10 DIAGNOSIS — Z833 Family history of diabetes mellitus: Secondary | ICD-10-CM

## 2019-10-10 DIAGNOSIS — G4733 Obstructive sleep apnea (adult) (pediatric): Secondary | ICD-10-CM | POA: Diagnosis present

## 2019-10-10 DIAGNOSIS — R1110 Vomiting, unspecified: Secondary | ICD-10-CM

## 2019-10-10 DIAGNOSIS — E872 Acidosis, unspecified: Secondary | ICD-10-CM | POA: Insufficient documentation

## 2019-10-10 DIAGNOSIS — E871 Hypo-osmolality and hyponatremia: Secondary | ICD-10-CM | POA: Diagnosis present

## 2019-10-10 DIAGNOSIS — I480 Paroxysmal atrial fibrillation: Secondary | ICD-10-CM | POA: Diagnosis present

## 2019-10-10 DIAGNOSIS — R110 Nausea: Secondary | ICD-10-CM

## 2019-10-10 DIAGNOSIS — Z66 Do not resuscitate: Secondary | ICD-10-CM | POA: Diagnosis not present

## 2019-10-10 DIAGNOSIS — F329 Major depressive disorder, single episode, unspecified: Secondary | ICD-10-CM | POA: Diagnosis present

## 2019-10-10 DIAGNOSIS — E785 Hyperlipidemia, unspecified: Secondary | ICD-10-CM | POA: Diagnosis present

## 2019-10-10 DIAGNOSIS — I491 Atrial premature depolarization: Secondary | ICD-10-CM | POA: Diagnosis present

## 2019-10-10 DIAGNOSIS — Z7982 Long term (current) use of aspirin: Secondary | ICD-10-CM

## 2019-10-10 DIAGNOSIS — K219 Gastro-esophageal reflux disease without esophagitis: Secondary | ICD-10-CM

## 2019-10-10 DIAGNOSIS — D539 Nutritional anemia, unspecified: Secondary | ICD-10-CM | POA: Diagnosis present

## 2019-10-10 DIAGNOSIS — I10 Essential (primary) hypertension: Secondary | ICD-10-CM | POA: Diagnosis present

## 2019-10-10 DIAGNOSIS — K5900 Constipation, unspecified: Secondary | ICD-10-CM | POA: Diagnosis present

## 2019-10-10 DIAGNOSIS — Z20822 Contact with and (suspected) exposure to covid-19: Secondary | ICD-10-CM | POA: Diagnosis present

## 2019-10-10 DIAGNOSIS — Z83438 Family history of other disorder of lipoprotein metabolism and other lipidemia: Secondary | ICD-10-CM

## 2019-10-10 DIAGNOSIS — Z794 Long term (current) use of insulin: Secondary | ICD-10-CM

## 2019-10-10 DIAGNOSIS — J45909 Unspecified asthma, uncomplicated: Secondary | ICD-10-CM | POA: Diagnosis present

## 2019-10-10 DIAGNOSIS — R531 Weakness: Secondary | ICD-10-CM

## 2019-10-10 DIAGNOSIS — K7030 Alcoholic cirrhosis of liver without ascites: Secondary | ICD-10-CM | POA: Diagnosis present

## 2019-10-10 DIAGNOSIS — Z79899 Other long term (current) drug therapy: Secondary | ICD-10-CM

## 2019-10-10 DIAGNOSIS — I440 Atrioventricular block, first degree: Secondary | ICD-10-CM | POA: Diagnosis present

## 2019-10-10 DIAGNOSIS — I493 Ventricular premature depolarization: Secondary | ICD-10-CM | POA: Diagnosis present

## 2019-10-10 DIAGNOSIS — E1110 Type 2 diabetes mellitus with ketoacidosis without coma: Secondary | ICD-10-CM

## 2019-10-10 LAB — URINALYSIS, ROUTINE W REFLEX MICROSCOPIC
Bacteria, UA: NONE SEEN
Bilirubin Urine: NEGATIVE
Glucose, UA: NEGATIVE mg/dL
Hgb urine dipstick: NEGATIVE
Ketones, ur: 20 mg/dL — AB
Nitrite: NEGATIVE
Protein, ur: 30 mg/dL — AB
Specific Gravity, Urine: 1.013 (ref 1.005–1.030)
pH: 5 (ref 5.0–8.0)

## 2019-10-10 LAB — PROTIME-INR
INR: 1.1 (ref 0.8–1.2)
Prothrombin Time: 13.5 seconds (ref 11.4–15.2)

## 2019-10-10 LAB — CBC
HCT: 38.8 % — ABNORMAL LOW (ref 39.0–52.0)
Hemoglobin: 11.6 g/dL — ABNORMAL LOW (ref 13.0–17.0)
MCH: 30.9 pg (ref 26.0–34.0)
MCHC: 29.9 g/dL — ABNORMAL LOW (ref 30.0–36.0)
MCV: 103.5 fL — ABNORMAL HIGH (ref 80.0–100.0)
Platelets: 334 10*3/uL (ref 150–400)
RBC: 3.75 MIL/uL — ABNORMAL LOW (ref 4.22–5.81)
RDW: 14.7 % (ref 11.5–15.5)
WBC: 12.4 10*3/uL — ABNORMAL HIGH (ref 4.0–10.5)
nRBC: 0 % (ref 0.0–0.2)

## 2019-10-10 LAB — BASIC METABOLIC PANEL
Anion gap: 21 — ABNORMAL HIGH (ref 5–15)
BUN: 11 mg/dL (ref 6–20)
CO2: 16 mmol/L — ABNORMAL LOW (ref 22–32)
Calcium: 8.9 mg/dL (ref 8.9–10.3)
Chloride: 94 mmol/L — ABNORMAL LOW (ref 98–111)
Creatinine, Ser: 1.05 mg/dL (ref 0.61–1.24)
GFR calc Af Amer: >60 mL/min (ref >60)
GFR calc non Af Amer: >60 mL/min (ref >60)
Glucose, Bld: 83 mg/dL (ref 70–99)
Potassium: 4.3 mmol/L (ref 3.5–5.1)
Sodium: 131 mmol/L — ABNORMAL LOW (ref 135–145)

## 2019-10-10 LAB — SARS CORONAVIRUS 2 BY RT PCR (HOSPITAL ORDER, PERFORMED IN ~~LOC~~ HOSPITAL LAB): SARS Coronavirus 2: NEGATIVE

## 2019-10-10 LAB — MAGNESIUM: Magnesium: 1.5 mg/dL — ABNORMAL LOW (ref 1.7–2.4)

## 2019-10-10 LAB — GLUCOSE, CAPILLARY: Glucose-Capillary: 94 mg/dL (ref 70–99)

## 2019-10-10 LAB — CBG MONITORING, ED: Glucose-Capillary: 73 mg/dL (ref 70–99)

## 2019-10-10 LAB — APTT: aPTT: 37 seconds — ABNORMAL HIGH (ref 24–36)

## 2019-10-10 LAB — COMPREHENSIVE METABOLIC PANEL
ALT: 14 U/L (ref 0–44)
AST: 17 U/L (ref 15–41)
Albumin: 3.7 g/dL (ref 3.5–5.0)
Alkaline Phosphatase: 82 U/L (ref 38–126)
Anion gap: 23 — ABNORMAL HIGH (ref 5–15)
BUN: 12 mg/dL (ref 6–20)
CO2: 14 mmol/L — ABNORMAL LOW (ref 22–32)
Calcium: 9.2 mg/dL (ref 8.9–10.3)
Chloride: 94 mmol/L — ABNORMAL LOW (ref 98–111)
Creatinine, Ser: 0.91 mg/dL (ref 0.61–1.24)
GFR calc Af Amer: >60 mL/min (ref >60)
GFR calc non Af Amer: >60 mL/min (ref >60)
Glucose, Bld: 77 mg/dL (ref 70–99)
Potassium: 4.3 mmol/L (ref 3.5–5.1)
Sodium: 131 mmol/L — ABNORMAL LOW (ref 135–145)
Total Bilirubin: 1.2 mg/dL (ref 0.3–1.2)
Total Protein: 9.2 g/dL — ABNORMAL HIGH (ref 6.5–8.1)

## 2019-10-10 LAB — PHOSPHORUS: Phosphorus: 3.6 mg/dL (ref 2.5–4.6)

## 2019-10-10 LAB — TSH: TSH: 2.15 u[IU]/mL (ref 0.350–4.500)

## 2019-10-10 LAB — BETA-HYDROXYBUTYRIC ACID: Beta-Hydroxybutyric Acid: 6.48 mmol/L — ABNORMAL HIGH (ref 0.05–0.27)

## 2019-10-10 LAB — LACTIC ACID, PLASMA: Lactic Acid, Venous: 1.8 mmol/L (ref 0.5–1.9)

## 2019-10-10 LAB — SALICYLATE LEVEL: Salicylate Lvl: <7 mg/dL — ABNORMAL LOW (ref 7.0–30.0)

## 2019-10-10 LAB — ETHANOL: Alcohol, Ethyl (B): <10 mg/dL (ref <10)

## 2019-10-10 LAB — CK: Total CK: 76 U/L (ref 49–397)

## 2019-10-10 LAB — T4, FREE: Free T4: 1.12 ng/dL (ref 0.61–1.12)

## 2019-10-10 MED ORDER — ENOXAPARIN SODIUM 80 MG/0.8ML ~~LOC~~ SOLN
70.0000 mg | SUBCUTANEOUS | Status: DC
  Administered 2019-10-10 – 2019-10-12 (×3): 70 mg via SUBCUTANEOUS
  Filled 2019-10-10: qty 0.8
  Filled 2019-10-10: qty 0.7
  Filled 2019-10-10: qty 0.8

## 2019-10-10 MED ORDER — GABAPENTIN 300 MG PO CAPS
300.0000 mg | ORAL_CAPSULE | Freq: Three times a day (TID) | ORAL | Status: DC
  Administered 2019-10-10 – 2019-10-12 (×7): 300 mg via ORAL
  Filled 2019-10-10 (×7): qty 1

## 2019-10-10 MED ORDER — INSULIN ASPART 100 UNIT/ML ~~LOC~~ SOLN: 0.0000 [IU] | Freq: Every day | SUBCUTANEOUS | Status: DC

## 2019-10-10 MED ORDER — FLUOXETINE HCL 20 MG PO CAPS
20.0000 mg | ORAL_CAPSULE | Freq: Every day | ORAL | Status: DC
  Administered 2019-10-10 – 2019-10-12 (×3): 20 mg via ORAL
  Filled 2019-10-10 (×3): qty 1

## 2019-10-10 MED ORDER — DEXTROSE 5 % IV SOLN: INTRAVENOUS | Status: AC

## 2019-10-10 MED ORDER — ATORVASTATIN CALCIUM 40 MG PO TABS
40.0000 mg | ORAL_TABLET | Freq: Every day | ORAL | Status: DC
  Administered 2019-10-10 – 2019-10-12 (×3): 40 mg via ORAL
  Filled 2019-10-10 (×3): qty 1

## 2019-10-10 MED ORDER — INSULIN ASPART 100 UNIT/ML ~~LOC~~ SOLN: 0.0000 [IU] | Freq: Three times a day (TID) | SUBCUTANEOUS | Status: DC

## 2019-10-10 MED ORDER — MAGNESIUM SULFATE 4 GM/100ML IV SOLN
4.0000 g | Freq: Once | INTRAVENOUS | Status: AC
  Administered 2019-10-10: 4 g via INTRAVENOUS
  Filled 2019-10-10: qty 100

## 2019-10-10 MED ORDER — PROMETHAZINE HCL 25 MG PO TABS
12.5000 mg | ORAL_TABLET | Freq: Four times a day (QID) | ORAL | Status: DC | PRN
  Administered 2019-10-11: 12.5 mg via ORAL
  Filled 2019-10-10: qty 1

## 2019-10-10 MED ORDER — INSULIN GLARGINE 100 UNIT/ML ~~LOC~~ SOLN: 24.0000 [IU] | Freq: Every day | SUBCUTANEOUS | Status: DC

## 2019-10-10 MED ORDER — AMLODIPINE BESYLATE 10 MG PO TABS
10.0000 mg | ORAL_TABLET | Freq: Every day | ORAL | Status: DC
  Administered 2019-10-10 – 2019-10-12 (×3): 10 mg via ORAL
  Filled 2019-10-10: qty 1
  Filled 2019-10-10: qty 2
  Filled 2019-10-10: qty 1

## 2019-10-10 MED ORDER — SODIUM CHLORIDE 0.9 % IV BOLUS
1000.0000 mL | Freq: Once | INTRAVENOUS | Status: AC
  Administered 2019-10-10: 1000 mL via INTRAVENOUS

## 2019-10-10 MED ORDER — INSULIN ASPART PROT & ASPART (70-30 MIX) 100 UNIT/ML ~~LOC~~ SUSP
15.0000 [IU] | Freq: Two times a day (BID) | SUBCUTANEOUS | Status: DC
  Administered 2019-10-11 – 2019-10-12 (×3): 15 [IU] via SUBCUTANEOUS
  Filled 2019-10-10 (×2): qty 10

## 2019-10-10 MED ORDER — POLYETHYLENE GLYCOL 3350 17 G PO PACK: 17.0000 g | PACK | Freq: Every day | ORAL | Status: DC | PRN

## 2019-10-10 MED ORDER — CARVEDILOL 12.5 MG PO TABS: 12.5000 mg | ORAL_TABLET | Freq: Every day | ORAL | Status: DC

## 2019-10-10 MED ORDER — INSULIN ASPART 100 UNIT/ML ~~LOC~~ SOLN
0.0000 [IU] | Freq: Three times a day (TID) | SUBCUTANEOUS | Status: DC
  Administered 2019-10-11: 2 [IU] via SUBCUTANEOUS
  Administered 2019-10-11: 3 [IU] via SUBCUTANEOUS
  Administered 2019-10-11 – 2019-10-12 (×3): 1 [IU] via SUBCUTANEOUS
  Administered 2019-10-12: 2 [IU] via SUBCUTANEOUS

## 2019-10-10 MED ORDER — FAMOTIDINE 20 MG PO TABS
10.0000 mg | ORAL_TABLET | Freq: Two times a day (BID) | ORAL | Status: DC
  Administered 2019-10-10 – 2019-10-12 (×5): 10 mg via ORAL
  Filled 2019-10-10 (×5): qty 1

## 2019-10-10 NOTE — ED Notes (Signed)
Pt's CBG is 73  St's he does not want to eat.  Insulin not given

## 2019-10-10 NOTE — ED Notes (Signed)
Pt had large formed brown bowel movement in bedpan

## 2019-10-10 NOTE — H&P (Addendum)
Date: 10/10/2019               Patient Name:  Allen Gould MRN: [id number]  DOB: 12-14-1965 Age / Sex: 54 y.o., male   PCP: [name], MD         Medical Service: Internal Medicine Teaching Service         Attending Physician: Dr. Velna Ochs, MD    First Contact: Dr. Gilford Rile Pager: [phone]  Second Contact: Dr. Koleen Distance Pager: [phone]       After Hours (After 5p/  First Contact Pager: [phone]  weekends / holidays): Second Contact Pager: [phone]   Chief Complaint: Fall  History of Present Illness: Allen Gould is a 54 y.o male with OSA, PAF, HTN, and DM on insulin therapy who presented today after a fall. History was obtain via the patient and through chart review.   Patient states that he came to the ED due to generalized weakness. States that this has been going on for 2 years after he was diagnosed with A-fib. Since that time he has felt his body has "changed" and "won't gain strength." He recently left rehab but does feel it provided much benefit. He is "barely able to stand-up." Once he got home from rehab on Friday evening he fell out of his wheelchair and laid on the floor for 1.5 days. He called EMS who helped him get to a recliner on Saturday. Since that time he has been sitting in his recliner.   Endorses intermittent DOE and palpitations. This isn't constant but seems to occur with any type of strenuous exertion. Endorses insomnia and excessive snoring. He issues then to be more with maintenance of sleep and he frequently naps throughout the day. He has a diagnosis of OSA and just started using his CPAP on Friday. Endorses decreased appetite and no interest in food. He does not enjoy hobbies. He use to love fishing and working but has no desire to do anything now. He has lost a significant amount of weight, stating he has lost >20lbs in the past month. ROS is also positive for constipation and N/V.  Unfortunately the patient doesn't have the best home  situation. His fiance died of COVID in 2022/06/23 and his landlord shortly thereafter kicked him out. He then moved into a trailer but has only been there a handful of day due to hospitalizations and being admitted into rehab.    Meds:  Current Facility-Administered Medications on File Prior to Encounter  Medication Dose Route Frequency Provider Last Rate Last Admin  . lisinopril (PRINIVIL,ZESTRIL) tablet 20 mg  20 mg Oral Once Francesca Oman, DO       Current Outpatient Medications on File Prior to Encounter  Medication Sig Dispense Refill  . aspirin 325 MG tablet Take 325 mg by mouth daily as needed for moderate pain.    [name] atorvastatin (LIPITOR) 40 MG tablet Take 1 tablet (40 mg total) by mouth daily at 6 PM. 30 tablet 2  . folic acid (FOLVITE) 1 MG tablet Take 1 tablet (1 mg total) by mouth daily. 360 tablet 0  . gabapentin (NEURONTIN) 300 MG capsule Take 1 capsule by mouth in the morning, at noon, and at bedtime.    . Glucagon HCl (GLUCAGON EMERGENCY) 1 MG/ML SOLR Inject 1 mg as directed daily as needed. Give for cbg less than 60 and unable to consume glucose replacment     . amLODipine (NORVASC) 10 MG tablet Take 10 mg by  mouth daily.    . carvedilol (COREG) 12.5 MG tablet Take 12.5 mg by mouth daily.     . diclofenac Sodium (VOLTAREN) 1 % GEL Apply 2 g topically 4 (four) times daily.    . famotidine (PEPCID) 10 MG tablet Take 1 tablet (10 mg total) by mouth 2 (two) times daily. (Patient taking differently: Take 20 mg by mouth daily. ) 60 tablet 0  . insulin aspart protamine- aspart (NOVOLOG MIX 70/30) (70-30) 100 UNIT/ML injection Inject 0.2 mLs (20 Units total) into the skin 2 (two) times daily with a meal. 10 mL 11  . lisinopril (PRINIVIL,ZESTRIL) 20 MG tablet Take 1 tablet (20 mg total) by mouth daily. (Patient not taking: Reported on 09/01/2019) 30 tablet 2  . metFORMIN (GLUCOPHAGE) 1000 MG tablet Take 1 tablet (1,000 mg total) by mouth 2 (two) times daily with a meal. (Patient not taking:  Reported on 09/01/2019) 60 tablet 2  . nystatin (NYSTATIN) powder Apply 1 application topically 3 (three) times daily.    . vitamin B-12 (CYANOCOBALAMIN) 1000 MCG tablet Take 1,000 mcg by mouth daily.     Allergies: Allergies as of 10/10/2019 - Review Complete 10/10/2019  Allergen Reaction Noted  . Shellfish allergy Hives 08/10/2013   Past Medical History:  Diagnosis Date  . Acute kidney injury ([place]) 09/01/2019  . Asthma   . Balanitis 08/09/2014  . Bifascicular block   . Cough 08/09/2014  . Diabetes mellitus without complication ([place])   . DM (diabetes mellitus) type 2, uncontrolled, without ketoacidosis 08/10/2013  . Dyslipidemia 03/01/2014  . Financial difficulties 08/09/2014  . GERD (gastroesophageal reflux disease)   . Health care maintenance 03/01/2014  . Hypertension   . Hypertensive urgency 05/29/2017  . Hypomagnesemia 05/29/2017  . Insomnia 08/29/2013  . Morbid obesity ([place]) 08/29/2013  . Near syncope 05/29/2017  . Odontogenic infection of jaw 05/29/2017  . PAC (premature atrial contraction)   . PAF (paroxysmal atrial fibrillation) ([place])   . Pressure injury of skin 09/02/2019  . PVCs (premature ventricular contractions)   . Right shoulder pain 08/29/2013  . Second degree AV block, Mobitz type I   . Sepsis ([place]) 05/29/2017  . Sleep apnea    uses cpap   Family History  Problem Relation Age of Onset  . Kidney disease Mother   . Diabetes Sister   . Hyperlipidemia Sister   . Heart disease Other    Social History: Lives in a trailer. 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 Denies the use of tobacco products but drinks 1/2 gallon of EtOH daily. Last drink was 3 days ago. Denies withdrawal symptoms. Prior use of illicit substances.  Review of Systems: A complete ROS was negative except as per HPI.   Physical Exam: Blood pressure 137/70, pulse (!) 53, temperature 98.1 F (36.7 C), temperature source Oral, resp. rate 15, height 5' 10"  (1.778 m), weight (!) 140.2 kg,  SpO2 100 %.  General: Obese male in no acute distress HENT: Normocephalic, atraumatic, moist mucus membranes Pulm: Good air movement with no wheezing or crackles  CV: Bradycardic but regular rhythm, no murmurs, no rubs  Abdomen: Active bowel sounds, soft, non-distended, no tenderness to palpation  Extremities: Pulses palpable in all extremities, no LE edema  Skin: Warm and dry  Neuro: Alert and oriented x 3, depressed affect   EKG: personally reviewed my interpretation is sinus rhythm with a PAC and PVC, PR prolongation, and RBBB. Do not see any dropped beats. This is stable compared  to EKG on 09/26/19.  CXR: personally reviewed my interpretation is poor rotation, inspiration, and penetration. No bony or soft tissue abnormalities. No enlargement of the cardiac silhouette. No pleural effusions. No opacities. Does have some slight increased interstitial markings.   Assessment & Plan by Problem: Allen Gould is a 54 y.o male with OSA, PAF, HTN, and DM on insulin therapy who presented today with generalized weakness of 2 years duration. He was found to have an abnormal EKG and HAGMA. He was subsequently admitted for further evaluation/management.   Major Depressive Disorder Generalized weakness  - Mets clinical criteria for MDD - Denies SI/HI  - Lack social support - Check TSH - Plan to start fluoxetine 20 mg QD  HAGMA  - AG calculated to 23 but appropriate delta delta  - Lactic acid negative  - Ketones in urine, checking beta-hydroxybutyric acid. Not on an SGLT-2 inhibitor to be concerned about euglycemic DKA. Probable starvation or EtOH.  - Check additional labs. VBG, Salicylate levels, EtOH level, phosp level, volatile levels, and recheck BMP   PAF Multiple PVCs/PACs First Degree AV Block  - EKG with prolonged PR interval and multiple PVCs/PACs  - Avoid nodal blocking agents  - Will check Echo  - CHADsVASc of 3. Cardiology previously avoiding anticoagulation due to alcohol use  disorder   HTN - Amlodipine 10 mg QD and Lisinopril 20 mg QD  DM - Outpatient medications including Metformin 1000 mg BID and Novolog 70/30 20 units BID  - Will hold metformin and start Novolog 70/30 15 units BID + SSI  Diet: Carb mod VTE ppx: Lovenox CODE STATUS: Full Code  Dispo: Admit patient to Observation with expected length of stay less than 2 midnights.  Signed: Ina Homes, MD 10/10/2019, 1:35 PM  Pager: [phone]

## 2019-10-10 NOTE — ED Triage Notes (Signed)
Pt brought to ED from home for c/o fall with right side knee pain and right wrist pain. BP 144/90, HR 62, R 18, SPO2 97% RA. CBG 95.

## 2019-10-10 NOTE — ED Provider Notes (Signed)
[facility] EMERGENCY DEPARTMENT Provider Note   CSN: [id number] Arrival date & time: 10/10/19  0017     History Chief Complaint  Patient presents with  . Fall    Allen Gould is a 54 y.o. male.  HPI HPI Comments: Allen Gould is a 54 y.o. male with history of alcoholic cirrhosis, macrocytic anemia, diabetes, asthma, hypertension who presents to the Emergency Department due to a fall.  Patient has been discharged from a nursing facility 5 days ago.  Since being discharged he states that he has been experiencing generalized weakness, malaise, chills, nausea, vomiting, decreased p.o. intake.  He attempted to stand from his recliner today and fell on his right side.  In triage she noted right knee pain as well as right wrist pain.  Both these joints were x-rayed and were negative.    Patient states that "I have not done well since I left the nursing home".  He reports having felt generally weak since discharge and has not been getting out of his recliner. He has been experiencing nausea and vomiting with very little PO intake. He denies having had a BM for the past five days. He denies any fevers, URI symptoms, chest pain, shortness of breath, dysuria, decreased urination, syncope.  He denies any head trauma during the fall.  He confirms his history of alcoholism and states that he has not drank alcohol in over a month.  No history of DTs.     Past Medical History:  Diagnosis Date  . Acute kidney injury ([place]) 09/01/2019  . Asthma   . Balanitis 08/09/2014  . Bifascicular block   . Cough 08/09/2014  . Diabetes mellitus without complication ([place])   . DM (diabetes mellitus) type 2, uncontrolled, without ketoacidosis 08/10/2013  . Dyslipidemia 03/01/2014  . Financial difficulties 08/09/2014  . GERD (gastroesophageal reflux disease)   . Health care maintenance 03/01/2014  . Hypertension   . Hypertensive urgency 05/29/2017  . Hypomagnesemia 05/29/2017  . Insomnia 08/29/2013   . Morbid obesity ([place]) 08/29/2013  . Near syncope 05/29/2017  . Odontogenic infection of jaw 05/29/2017  . PAC (premature atrial contraction)   . PAF (paroxysmal atrial fibrillation) ([place])   . Pressure injury of skin 09/02/2019  . PVCs (premature ventricular contractions)   . Right shoulder pain 08/29/2013  . Second degree AV block, Mobitz type I   . Sepsis ([place]) 05/29/2017  . Sleep apnea    uses cpap    Patient Active Problem List   Diagnosis Date Noted  . Pressure injury of skin 09/02/2019  . Second degree AV block, Mobitz type I   . Bifascicular block   . PAC (premature atrial contraction)   . PVCs (premature ventricular contractions)   . PAF (paroxysmal atrial fibrillation) ([place])   . Acute kidney injury ([place]) 09/01/2019  . Sepsis ([place]) 05/29/2017  . Hypomagnesemia 05/29/2017  . Odontogenic infection of jaw 05/29/2017  . Near syncope 05/29/2017  . Hypertensive urgency 05/29/2017  . Cough 08/09/2014  . Balanitis 08/09/2014  . Financial difficulties 08/09/2014  . Health care maintenance 03/01/2014  . Dyslipidemia 03/01/2014  . Morbid obesity ([place]) 08/29/2013  . Right shoulder pain 08/29/2013  . Insomnia 08/29/2013  . DM (diabetes mellitus) type 2, uncontrolled, without ketoacidosis 08/10/2013  . Hypertension   . Asthma   . GERD (gastroesophageal reflux disease)     Past Surgical History:  Procedure Laterality Date  . APPENDECTOMY         Family History  Problem Relation Age of Onset  . Kidney disease Mother   . Diabetes Sister   . Hyperlipidemia Sister   . Heart disease Other     Social History   Tobacco Use  . Smoking status: Former Smoker    Types: Cigarettes  . Smokeless tobacco: Never Used  . Tobacco comment: 1 cigerette a dayb 2-3 cigerettes per week  Substance Use Topics  . Alcohol use: Yes    Alcohol/week: 2.0 standard drinks    Types: 2 Cans of beer per week    Comment: every other day  . Drug use: No    Home Medications Prior to Admission  medications   Medication Sig Start Date End Date Taking? Authorizing Provider  amLODipine (NORVASC) 10 MG tablet Take 10 mg by mouth daily.    [provider]  aspirin 325 MG tablet Take 325 mg by mouth daily as needed for moderate pain.    [provider]  atorvastatin (LIPITOR) 40 MG tablet Take 1 tablet (40 mg total) by mouth daily at 6 PM. 05/31/17   Nita Sells, MD  carvedilol (COREG) 12.5 MG tablet Take 12.5 mg by mouth daily.     [provider]  diclofenac Sodium (VOLTAREN) 1 % GEL Apply 2 g topically 4 (four) times daily.    [provider]  famotidine (PEPCID) 10 MG tablet Take 1 tablet (10 mg total) by mouth 2 (two) times daily. Patient taking differently: Take 20 mg by mouth daily.  09/05/19   Mosetta Anis, MD  folic acid (FOLVITE) 1 MG tablet Take 1 tablet (1 mg total) by mouth daily. 09/05/19 09/04/20  Mosetta Anis, MD  gabapentin (NEURONTIN) 300 MG capsule Take 1 capsule by mouth in the morning, at noon, and at bedtime. 12/13/18   [provider]  Glucagon HCl (GLUCAGON EMERGENCY) 1 MG/ML SOLR Inject 1 mg as directed as needed. Give for cbg less than 60 and unable to consume glucose replacment    [provider]  insulin aspart protamine- aspart (NOVOLOG MIX 70/30) (70-30) 100 UNIT/ML injection Inject 0.2 mLs (20 Units total) into the skin 2 (two) times daily with a meal. 05/31/17   Nita Sells, MD  lisinopril (PRINIVIL,ZESTRIL) 20 MG tablet Take 1 tablet (20 mg total) by mouth daily. Patient not taking: Reported on 09/01/2019 05/31/17   Nita Sells, MD  metFORMIN (GLUCOPHAGE) 1000 MG tablet Take 1 tablet (1,000 mg total) by mouth 2 (two) times daily with a meal. Patient not taking: Reported on 09/01/2019 05/31/17   Nita Sells, MD  nystatin (NYSTATIN) powder Apply 1 application topically 3 (three) times daily.    [provider]  vitamin B-12 (CYANOCOBALAMIN) 1000 MCG tablet Take 1,000 mcg by  mouth daily.    [provider]    Allergies    Shellfish allergy  Review of Systems   Review of Systems  All other systems reviewed and are negative. Ten systems reviewed and are negative for acute change, except as noted in the HPI.   Physical Exam Updated Vital Signs BP 137/70 (BP Location: Left Arm)   Pulse (!) 55   Temp 98.1 F (36.7 C) (Oral)   Resp 16   Ht 5' 10"  (1.778 m)   Wt (!) 140.2 kg   SpO2 100%   BMI 44.35 kg/m   Physical Exam Vitals and nursing note reviewed.  Constitutional:      General: He is not in acute distress.    Appearance: He is obese. He  is ill-appearing. He is not toxic-appearing or diaphoretic.     Comments: Unkempt fatigued appearing obese male.  He is lying supine.  He speaks quietly.  He does answer questions appropriately.  HENT:     Head: Normocephalic and atraumatic.     Right Ear: External ear normal.     Left Ear: External ear normal.     Nose: Nose normal. No congestion.     Mouth/Throat:     Mouth: Mucous membranes are moist.     Pharynx: Oropharynx is clear. No oropharyngeal exudate or posterior oropharyngeal erythema.  Eyes:     General: No scleral icterus.       Right eye: No discharge.        Left eye: No discharge.     Extraocular Movements: Extraocular movements intact.     Conjunctiva/sclera: Conjunctivae normal.     Pupils: Pupils are equal, round, and reactive to light.  Cardiovascular:     Rate and Rhythm: Normal rate and regular rhythm.     Pulses: Normal pulses.     Heart sounds: Murmur present. No friction rub. No gallop.   Pulmonary:     Effort: Pulmonary effort is normal. No respiratory distress.     Breath sounds: Normal breath sounds. No stridor. No wheezing, rhonchi or rales.  Abdominal:     Palpations: Abdomen is soft.     Tenderness: There is no abdominal tenderness.     Comments: Protuberant abdomen.  Easily reducible umbilical hernia noted.  No focal tenderness noted in the abdomen.   Genitourinary:    Comments: 1 to 2 inch region of erythema and irritation to the posterior scrotum. Musculoskeletal:        General: Normal range of motion.     Cervical back: Normal range of motion and neck supple. No tenderness.     Right lower leg: No edema.     Left lower leg: No edema.     Comments: Dry flaking skin noted across the lower extremities.  No visible edema.  Diffuse mild TTP noted along the right anterior knee.  No visible signs of trauma.  No palpable effusion.  Skin:    General: Skin is warm and dry.     Comments: No visible sacral wounds or ulcerations.  Neurological:     General: No focal deficit present.     Mental Status: He is alert and oriented to person, place, and time.  Psychiatric:        Attention and Perception: He is inattentive.        Mood and Affect: Mood is depressed.        Behavior: Behavior is slowed.    ED Results / Procedures / Treatments   Labs (all labs ordered are listed, but only abnormal results are displayed) Labs Reviewed  CBC - Abnormal; Notable for the following components:      Result Value   WBC 12.4 (*)    RBC 3.75 (*)    Hemoglobin 11.6 (*)    HCT 38.8 (*)    MCV 103.5 (*)    MCHC 29.9 (*)    All other components within normal limits  COMPREHENSIVE METABOLIC PANEL - Abnormal; Notable for the following components:   Sodium 131 (*)    Chloride 94 (*)    CO2 14 (*)    Total Protein 9.2 (*)    Anion gap 23 (*)    All other components within normal limits  URINALYSIS, ROUTINE W REFLEX MICROSCOPIC - Abnormal;  Notable for the following components:   Ketones, ur 20 (*)    Protein, ur 30 (*)    Leukocytes,Ua TRACE (*)    All other components within normal limits  APTT - Abnormal; Notable for the following components:   aPTT 37 (*)    All other components within normal limits  URINE CULTURE  CULTURE, BLOOD (ROUTINE X 2)  CULTURE, BLOOD (ROUTINE X 2)  SARS CORONAVIRUS 2 BY RT PCR (HOSPITAL ORDER, [place] [facility])  LACTIC ACID, PLASMA  PROTIME-INR  CK  LACTIC ACID, PLASMA  BETA-HYDROXYBUTYRIC ACID  MAGNESIUM  BASIC METABOLIC PANEL  BLOOD GAS, VENOUS  ETHANOL  SALICYLATE LEVEL  VOLATILES,BLD-ACETONE,ETHANOL,ISOPROP,METHANOL  PHOSPHORUS   EKG None  Radiology DG Chest 1 View  Result Date: 10/10/2019 CLINICAL DATA:  Weakness EXAM: CHEST  1 VIEW COMPARISON:  09/26/2019 FINDINGS: The heart size and mediastinal contours are within normal limits. Low lung volumes. Mildly prominent interstitial markings bilaterally. No focal consolidation. No pleural effusion or pneumothorax. The visualized skeletal structures are unremarkable. IMPRESSION: Mildly prominent bilateral interstitial markings which could reflect mild edema versus atypical/viral infection in the appropriate clinical setting. Electronically Signed   By: Davina Poke D.O.   On: 10/10/2019 10:30   DG Wrist Complete Right  Result Date: 10/10/2019 CLINICAL DATA:  [place]. Right wrist pain. EXAM: RIGHT WRIST - COMPLETE 3+ VIEW COMPARISON:  None. FINDINGS: Mild degenerative changes are noted. No acute fracture is identified. IMPRESSION: No acute bony findings. Electronically Signed   By: Marijo Sanes M.D.   On: 10/10/2019 07:40   DG Abd 1 View  Result Date: 10/10/2019 CLINICAL DATA:  Nausea and vomiting. EXAM: ABDOMEN - 1 VIEW COMPARISON:  09/02/2019 FINDINGS: Scattered air-filled loops of small bowel in the right abdomen but no distension. Scattered air and stool in the colon and rectum. No findings to suggest obstruction or perforation. The soft tissue shadows are maintained. No worrisome calcifications. The visualized lung bases are grossly clear. IMPRESSION: No plain film findings for an acute abdominal process. Electronically Signed   By: Marijo Sanes M.D.   On: 10/10/2019 10:30   DG Knee Complete 4 Views Right  Result Date: 10/10/2019 CLINICAL DATA:  [place]. Right knee pain. EXAM: RIGHT KNEE - COMPLETE 4+ VIEW COMPARISON:  None.  FINDINGS: The joint spaces are maintained. No acute fracture is identified. There is a remote healed fracture involving the proximal fibular shaft. No definite joint effusion. IMPRESSION: No acute bony findings or joint effusion. Electronically Signed   By: Marijo Sanes M.D.   On: 10/10/2019 07:39   Procedures .Critical Care Performed by: Rayna Sexton, PA-C Authorized by: Rayna Sexton, PA-C   Critical care provider statement:    Critical care time (minutes):  45   Critical care start time:  10/10/2019 11:13 AM   Critical care end time:  10/10/2019 12:30 PM   Critical care was necessary to treat or prevent imminent or life-threatening deterioration of the following conditions:  Metabolic crisis and sepsis   Critical care was time spent personally by me on the following activities:  Discussions with consultants, evaluation of patient's response to treatment, examination of patient, development of treatment plan with patient or surrogate, ordering and performing treatments and interventions, ordering and review of laboratory studies, ordering and review of radiographic studies and review of old charts    Medications Ordered in ED Medications  sodium chloride 0.9 % bolus 1,000 mL (1,000 mLs Intravenous New Bag/Given 10/10/19 1105)   ED Course  I have reviewed the triage vital signs and the nursing notes.  Pertinent labs & imaging results that were available during my care of the patient were reviewed by me and considered in my medical decision making (see chart for details).    MDM Rules/Calculators/A&P                      Patient presents today due to a fall.  Please see HPI and physical exam above for further information.  He is generally weak.  He feels that he has been "not doing well since he got out of a nursing home 5 days ago."  X-rays of his right knee and wrist were obtained in triage and were negative.  Due to his general state of health will obtain a chest x-ray as well  as an abdominal x-ray in addition to basic labs and an ECG.  Echo on May 30, 2017 shows a left ventricular ejection fraction between 55 to 60%.  Abdominal x-ray showing no acute abnormalities.  Chest x-ray showing some mildly prominent bilateral interstitial markings.  Edema versus possible viral infection.  His lungs were clear to auscultation bilaterally on exam and I was not seeing any peripheral edema.  Will give patient some maintenance fluids.  Will order a COVID-19 test. Initial labs showing hyponatremia at 131 with hypochloremia at 94.  Anion gap of 23.  Decreased bicarb of 14.  Mild leukocytosis of 12.4.  Hemoglobin is 11.6 which seems to be improved from baseline.  Urine showing ketonuria of 20 and proteinuria of 30.  Trace leukocytes.  No nitrites.  Normal creatinine.  AST and ALT within normal limits.  Based on history, there is some concern that this could be possible starvation ketoacidosis.  We will add on a beta hydroxybutyrate.  Patient is euglycemic at this time.  My attending Dr. Harrell Gave Tegeler evaluated the patient's EKG and is concerned that V3 could signify a complete heart block.  Will discuss with cardiology regarding this.     Cardiology says this is likely Mobitz type II.  They do not believe it is a complete heart block.  No elevation in his lactate.  Will discuss with the internal medicine team for possible admission.  Final Clinical Impression(s) / ED Diagnoses Final diagnoses:  Weakness  Mobitz type II block  Metabolic acidosis   Rx / DC Orders ED Discharge Orders    None       Rayna Sexton, PA-C 10/10/19 1233    Tegeler, [name], MD 10/10/19 [phone]

## 2019-10-10 NOTE — ED Notes (Signed)
Pt eating dinner at this time

## 2019-10-11 ENCOUNTER — Ambulatory Visit (HOSPITAL_BASED_OUTPATIENT_CLINIC_OR_DEPARTMENT_OTHER): Payer: Medicaid Other

## 2019-10-11 DIAGNOSIS — I350 Nonrheumatic aortic (valve) stenosis: Secondary | ICD-10-CM | POA: Diagnosis not present

## 2019-10-11 DIAGNOSIS — I342 Nonrheumatic mitral (valve) stenosis: Secondary | ICD-10-CM

## 2019-10-11 DIAGNOSIS — R110 Nausea: Secondary | ICD-10-CM | POA: Diagnosis not present

## 2019-10-11 DIAGNOSIS — R531 Weakness: Secondary | ICD-10-CM | POA: Diagnosis not present

## 2019-10-11 DIAGNOSIS — E872 Acidosis: Secondary | ICD-10-CM | POA: Diagnosis not present

## 2019-10-11 LAB — GLUCOSE, CAPILLARY
Glucose-Capillary: 126 mg/dL — ABNORMAL HIGH (ref 70–99)
Glucose-Capillary: 183 mg/dL — ABNORMAL HIGH (ref 70–99)
Glucose-Capillary: 205 mg/dL — ABNORMAL HIGH (ref 70–99)
Glucose-Capillary: 168 mg/dL — ABNORMAL HIGH (ref 70–99)

## 2019-10-11 LAB — BASIC METABOLIC PANEL
Anion gap: 14 (ref 5–15)
BUN: 8 mg/dL (ref 6–20)
CO2: 21 mmol/L — ABNORMAL LOW (ref 22–32)
Calcium: 8.7 mg/dL — ABNORMAL LOW (ref 8.9–10.3)
Chloride: 95 mmol/L — ABNORMAL LOW (ref 98–111)
Creatinine, Ser: 0.93 mg/dL (ref 0.61–1.24)
GFR calc Af Amer: >60 mL/min (ref >60)
GFR calc non Af Amer: >60 mL/min (ref >60)
Glucose, Bld: 124 mg/dL — ABNORMAL HIGH (ref 70–99)
Potassium: 3.8 mmol/L (ref 3.5–5.1)
Sodium: 130 mmol/L — ABNORMAL LOW (ref 135–145)

## 2019-10-11 LAB — VOLATILES,BLD-ACETONE,ETHANOL,ISOPROP,METHANOL
Acetone, blood: 0.022 g/dL — ABNORMAL HIGH (ref 0.000–0.010)
Ethanol, blood: <0.01 g/dL (ref 0.000–0.010)
Isopropanol, blood: <0.01 g/dL (ref 0.000–0.010)
Methanol, blood: <0.01 g/dL (ref 0.000–0.010)

## 2019-10-11 LAB — ECHOCARDIOGRAM COMPLETE
Height: 70 in
Weight: 4945.36 oz

## 2019-10-11 LAB — CBC
HCT: 28 % — ABNORMAL LOW (ref 39.0–52.0)
Hemoglobin: 8.9 g/dL — ABNORMAL LOW (ref 13.0–17.0)
MCH: 31 pg (ref 26.0–34.0)
MCHC: 31.8 g/dL (ref 30.0–36.0)
MCV: 97.6 fL (ref 80.0–100.0)
Platelets: 274 10*3/uL (ref 150–400)
RBC: 2.87 MIL/uL — ABNORMAL LOW (ref 4.22–5.81)
RDW: 14.5 % (ref 11.5–15.5)
WBC: 10.8 10*3/uL — ABNORMAL HIGH (ref 4.0–10.5)
nRBC: 0 % (ref 0.0–0.2)

## 2019-10-11 LAB — MAGNESIUM: Magnesium: 1.7 mg/dL (ref 1.7–2.4)

## 2019-10-11 LAB — BETA-HYDROXYBUTYRIC ACID: Beta-Hydroxybutyric Acid: 2.86 mmol/L — ABNORMAL HIGH (ref 0.05–0.27)

## 2019-10-11 MED ORDER — METOCLOPRAMIDE HCL 10 MG PO TABS
10.0000 mg | ORAL_TABLET | Freq: Three times a day (TID) | ORAL | Status: DC
  Administered 2019-10-11 – 2019-10-12 (×4): 10 mg via ORAL
  Filled 2019-10-11 (×4): qty 1

## 2019-10-11 MED ORDER — IBUPROFEN 600 MG PO TABS
600.0000 mg | ORAL_TABLET | Freq: Four times a day (QID) | ORAL | Status: DC | PRN
  Administered 2019-10-11 (×2): 600 mg via ORAL
  Filled 2019-10-11 (×2): qty 1

## 2019-10-11 MED ORDER — PERFLUTREN LIPID MICROSPHERE
1.0000 mL | INTRAVENOUS | Status: AC | PRN
  Administered 2019-10-11: 3 mL via INTRAVENOUS
  Filled 2019-10-11: qty 10

## 2019-10-11 NOTE — Plan of Care (Signed)

## 2019-10-11 NOTE — NC FL2 (Signed)
[place] MEDICAID FL2 LEVEL OF CARE SCREENING TOOL     IDENTIFICATION  Patient Name: Allen Gould Birthdate: June 26, 1965 Sex: male Admission Date (Current Location): 10/10/2019  [facility] and [place] Number:  Herbalist and Address:  [facility] [place], [place] [address], [place], [place] [address]      Provider Number: [id number]  Attending Physician Name and Address:  Velna Ochs, MD  Relative Name and Phone Number:  Maylon Cos   [phone]    Current Level of Care: Hospital Recommended Level of Care: [place] Prior Approval Number:    Date Approved/Denied:   PASRR Number: [id number]  Discharge Plan: SNF    Current Diagnoses: Patient Active Problem List   Diagnosis Date Noted  . Nausea   . Weakness 10/10/2019  . Metabolic acidosis   . Pressure injury of skin 09/02/2019  . Second degree AV block, Mobitz type I   . Bifascicular block   . PAC (premature atrial contraction)   . PVCs (premature ventricular contractions)   . PAF (paroxysmal atrial fibrillation) ([place])   . Acute kidney injury ([place]) 09/01/2019  . Sepsis ([place]) 05/29/2017  . Hypomagnesemia 05/29/2017  . Odontogenic infection of jaw 05/29/2017  . Near syncope 05/29/2017  . Hypertensive urgency 05/29/2017  . Cough 08/09/2014  . Balanitis 08/09/2014  . Financial difficulties 08/09/2014  . Health care maintenance 03/01/2014  . Dyslipidemia 03/01/2014  . Morbid obesity ([place]) 08/29/2013  . Right shoulder pain 08/29/2013  . Insomnia 08/29/2013  . DM (diabetes mellitus) type 2, uncontrolled, without ketoacidosis 08/10/2013  . Hypertension   . Asthma   . GERD (gastroesophageal reflux disease)     Orientation RESPIRATION BLADDER Height & Weight     Self, Time, Situation, Place  Normal Continent Weight: (!) 140.2 kg Height:  5' 10"  (177.8 cm)  BEHAVIORAL SYMPTOMS/MOOD NEUROLOGICAL BOWEL NUTRITION STATUS  (none) (n/a) Continent Diet(Carb modified)   AMBULATORY STATUS COMMUNICATION OF NEEDS Skin   Extensive Assist(max assist) Verbally Normal                       Personal Care Assistance Level of Assistance  Bathing, Feeding, Dressing, Total care Bathing Assistance: Maximum assistance Feeding assistance: Limited assistance(set up only) Dressing Assistance: Maximum assistance Total Care Assistance: Maximum assistance   Functional Limitations Info  Sight, Hearing, Speech Sight Info: Adequate Hearing Info: Adequate Speech Info: Adequate    SPECIAL CARE FACTORS FREQUENCY  OT (By licensed OT), PT (By licensed PT)     PT Frequency: 5X OT Frequency: 5X            Contractures Contractures Info: Not present    Additional Factors Info  Code Status, Allergies, Psychotropic, Insulin Sliding Scale, Isolation Precautions, Suctioning Needs Code Status Info: DNR Allergies Info: shellfish Psychotropic Info: Prozac 7m daily - Major depressive disorder Insulin Sliding Scale Info: Insulin sliding scale three times daily with meals Isolation Precautions Info: none Suctioning Needs: n/a   Current Medications (10/11/2019):  This is the current hospital active medication list Current Facility-Administered Medications  Medication Dose Route Frequency Provider Last Rate Last Admin  . amLODipine (NORVASC) tablet 10 mg  10 mg Oral Daily WMaudie Mercury MD   10 mg at 10/11/19 0945  . atorvastatin (LIPITOR) tablet 40 mg  40 mg Oral q1800 WMaudie Mercury MD   40 mg at 10/10/19 1916  . enoxaparin (LOVENOX) injection 70 mg  70 mg Subcutaneous Q24H WMaudie Mercury MD   70 mg  at 10/11/19 1353  . famotidine (PEPCID) tablet 10 mg  10 mg Oral BID Maudie Mercury, MD   10 mg at 10/11/19 6803  . FLUoxetine (PROZAC) capsule 20 mg  20 mg Oral Daily Ina Homes, MD   20 mg at 10/11/19 2122  . gabapentin (NEURONTIN) capsule 300 mg  300 mg Oral TID Maudie Mercury, MD   300 mg at 10/11/19 4825  . ibuprofen (ADVIL) tablet 600 mg  600 mg Oral  Q6H PRN Madalyn Rob, MD   600 mg at 10/11/19 0830  . insulin aspart (novoLOG) injection 0-5 Units  0-5 Units Subcutaneous QHS Helberg, Justin, MD      . insulin aspart (novoLOG) injection 0-9 Units  0-9 Units Subcutaneous TID WC Ina Homes, MD   2 Units at 10/11/19 1145  . insulin aspart protamine- aspart (NOVOLOG MIX 70/30) injection 15 Units  15 Units Subcutaneous BID WC Ina Homes, MD   Stopped at 10/11/19 1054  . polyethylene glycol (MIRALAX / GLYCOLAX) packet 17 g  17 g Oral Daily PRN Maudie Mercury, MD      . promethazine (PHENERGAN) tablet 12.5 mg  12.5 mg Oral Q6H PRN Maudie Mercury, MD   12.5 mg at 10/11/19 0037   Facility-Administered Medications Ordered in Other Encounters  Medication Dose Route Frequency Provider Last Rate Last Admin  . lisinopril (PRINIVIL,ZESTRIL) tablet 20 mg  20 mg Oral Once Francesca Oman, DO         Discharge Medications: Please see discharge summary for a list of discharge medications.  Relevant Imaging Results:  Relevant Lab Results:   Additional Information SS# [id number]  Angelita Ingles, RN

## 2019-10-11 NOTE — Progress Notes (Signed)
  Echocardiogram 2D Echocardiogram has been performed.  Allen Gould 10/11/2019, 3:36 PM

## 2019-10-11 NOTE — Progress Notes (Signed)
Pt requested to be a DNR. Reached out to on call provider.   Pt also stated some concerns about his living environment. He stated that he has no room and is in the chair for most of the day. Pt stated that he fell and was on the floor for over a day and a half and no one in the house helped him.   He stated that no one should live like that and that's why he wants to be a DNR.   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

## 2019-10-11 NOTE — Progress Notes (Signed)
Occupational Therapy Evaluation Patient Details Name: Allen Gould MRN: [id number] DOB: 08/30/1965 Today's Date: 10/11/2019    History of Present Illness 54 y.o. 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 Xrays negative for fracture, CXR reveals pulmonary edema vs viral infection.   Clinical Impression   Patient lives at home and has been having weakness causing difficulty with ADLs and functional mobility.  He has limited caregiver support at home and has had falls.  Patient today having R wrist pain s/p fall presenting with edema and limited AROM.  Wrist pain making pushing through hands to stand difficult.  Patient has generalized weakness.  He was able to maintain fair seated balance.  Is set up for seated UB ALDs, though with increased time due to decreased fine motor.  Will continue to follow with OT acutely to address the deficits listed below.      Follow Up Recommendations  SNF;Supervision - Intermittent    Equipment Recommendations  Other (comment)(defer to next venue)    Recommendations for Other Services       Precautions / Restrictions Precautions Precautions: Fall Restrictions Weight Bearing Restrictions: No      Mobility Bed Mobility Overal bed mobility: Needs Assistance Bed Mobility: Rolling;Supine to Sit Rolling: Min guard   Supine to sit: Mod assist;HOB elevated     General bed mobility comments: oob in chair  Transfers Overall transfer level: Needs assistance Equipment used: Rolling walker (2 wheeled) Transfers: Sit to/from Stand Sit to Stand: Min assist;From elevated surface         General transfer comment: Attempted stand with one person assist but not able to complete from lower chair    Balance Overall balance assessment: Needs assistance;History of Falls Sitting-balance support: No  upper extremity supported;Feet supported Sitting balance-Leahy Scale: Fair     Standing balance support: Bilateral upper extremity supported Standing balance-Leahy Scale: Poor Standing balance comment: reliant on external assist                           ADL either performed or assessed with clinical judgement   ADL Overall ADL's : Needs assistance/impaired Eating/Feeding: Set up;Sitting   Grooming: Set up;Sitting   Upper Body Bathing: Set up;Sitting   Lower Body Bathing: Sit to/from stand;Maximal assistance   Upper Body Dressing : Set up;Sitting   Lower Body Dressing: Maximal assistance;Sit to/from stand   Toilet Transfer: Maximal assistance;+2 for physical assistance             General ADL Comments: Patient not able to stand from chair with one person assist today as R wrist pain made pushing up difficult     Vision         Perception     Praxis      Pertinent Vitals/Pain Pain Assessment: Faces Faces Pain Scale: Hurts little more Pain Location: perineal area due to "rash", R knee and wrist s/p fall Pain Descriptors / Indicators: Discomfort;Sore;Sharp Pain Intervention(s): Limited activity within patient's tolerance;Monitored during session;Repositioned     Hand Dominance Right   Extremity/Trunk Assessment Upper Extremity Assessment Upper Extremity Assessment: Generalized weakness;RUE deficits/detail RUE Deficits / Details: R wrist pain after fall, especially with ulnar deviation. Swollen. Minimal AROM - 10 degrees of flex/ex, 5 degrees ulnar/radial deviation RUE Coordination: decreased fine motor      Cervical / Trunk  Assessment Cervical / Trunk Assessment: Normal   Communication Communication Communication: No difficulties   Cognition Arousal/Alertness: Awake/alert Behavior During Therapy: Flat affect Overall Cognitive Status: Within Functional Limits for tasks assessed                                    General  Comments  R wrist edema from fall    Exercises     Shoulder Instructions      Home Living Family/patient expects to be discharged to:: Private residence Living Arrangements: Other relatives(Sister, though sister has had a cva and cannot help ) Available Help at Discharge: Personal care attendant(1 hour a day) Type of Home: Mobile home Home Access: Stairs to enter Entrance Stairs-Number of Steps: 3 Entrance Stairs-Rails: Can reach both Home Layout: One level     Bathroom Shower/Tub: Teacher, early years/pre: Standard     Home Equipment: Bedside commode;Wheelchair - [employer] - 4 wheels   Additional Comments: lives with sister and her friend, they do not help him. Has Aide for 1 hour/day, helps with bathing only; normally sleeps in recliner       Prior Functioning/Environment Level of Independence: Needs assistance  Gait / Transfers Assistance Needed: pt reports very minimal walking after d/c from SNF setting on 5/14, states being at home by himself was a struggle ADL's / [place] Needed: pt reports sponge bathing, does not get into shower. States he was doing things for self PTA but was having a very difficult time   Comments: Pt lost his wife 06/2019 due to covid, states it has been a difficult year        OT Problem List: Decreased strength;Decreased activity tolerance;Impaired balance (sitting and/or standing);Decreased range of motion;Decreased coordination;Obesity;Pain;Increased edema      OT Treatment/Interventions: Self-care/ADL training;Therapeutic exercise;Energy conservation;Modalities;Therapeutic activities;Patient/family education;Balance training    OT Goals(Current goals can be found in the care plan section) Acute Rehab OT Goals Patient Stated Goal: start walking again OT Goal Formulation: With patient Time For Goal Achievement: 10/25/19 Potential to Achieve Goals: Good  OT Frequency: Min 2X/week   Barriers to D/C: Decreased  caregiver support          Co-evaluation              AM-PAC OT "6 Clicks" Daily Activity     Outcome Measure Help from another person eating meals?: A Little Help from another person taking care of personal grooming?: A Little Help from another person toileting, which includes using toliet, bedpan, or urinal?: A Lot Help from another person bathing (including washing, rinsing, drying)?: A Lot Help from another person to put on and taking off regular upper body clothing?: A Little Help from another person to put on and taking off regular lower body clothing?: A Lot 6 Click Score: 15   End of Session Equipment Utilized During Treatment: Gait belt Nurse Communication: Mobility status  Activity Tolerance: Patient tolerated treatment well;Patient limited by pain Patient left: in chair;with call bell/phone within reach  OT Visit Diagnosis: Unsteadiness on feet (R26.81);History of falling (Z91.81);Muscle weakness (generalized) (M62.81);Pain Pain - Right/Left: Right Pain - part of body: Hand                Time: 1110-1149 OT Time Calculation (min): 39 min Charges:  OT General Charges $OT Visit: 1 Visit OT Evaluation $OT Eval Moderate Complexity: 1 Mod OT Treatments $Self Care/Home Management : 23-37 mins  August Luz, OTR/L  Phylliss Bob 10/11/2019, 1:23 PM

## 2019-10-11 NOTE — Progress Notes (Signed)
Paged to discuss CODE STATUS with patient.  Patient would like to change from full code to DNR.  Patient is alert and oriented x4.  When asked why patient would like to change CODE STATUS he states he feels his health has deteriorated over the past 2 years and if need to be coded he will be in worse condition after code.  He would not want to be a burden to his family. He was evaluated on admission and met criteria for MDD. I asked patient to wait until the morning to make a decision to give him some more time for his depression to be treated. He says he is certain he would like to have his code status changed. Patient denies HI/SI.   In addition he would like some medication for his wrist and knee pain already evaluated on admission. Guarding wrist on exam, appears to have mild erythema and swelling of right wrist. Unable to flex or extend wrist secondary to pain.  I reviewed Imaging and they were negative for acute fracture.  - Change code status to DNR - Ibuprofen PRN q6hrs for mild pain

## 2019-10-11 NOTE — Evaluation (Signed)
Physical Therapy Evaluation Patient Details Name: Allen Gould MRN: [id number] DOB: [birth year] Today's Date: 10/11/2019   History of Present Illness  54 y.o. 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 Xrays negative for fracture, CXR reveals pulmonary edema vs viral infection.  Clinical Impression   Pt presents with LE weakness, R knee and wrist pain secondary to fall, difficulty performing mobility tasks, unsteadiness in standing with recent history of falls, and decreased activity tolerance. Pt to benefit from acute PT to address deficits. Pt ambulated very short distance from EOB to recliner only this day, limited by pain, fatigue, and nausea. Pt states being at home after d/c from SNF on 5/14 has been extremely difficult for him, pt lives alone and PT feels pt cannot care for self and mobilize safely. PT recommending SNF level of care post-acutely. PT to progress mobility as tolerated, and will continue to follow acutely.      Follow Up Recommendations SNF;Supervision/Assistance - 24 hour    Equipment Recommendations  None recommended by PT    Recommendations for Other Services       Precautions / Restrictions Precautions Precautions: Fall Restrictions Weight Bearing Restrictions: No      Mobility  Bed Mobility Overal bed mobility: Needs Assistance Bed Mobility: Rolling;Supine to Sit Rolling: Min guard   Supine to sit: Mod assist;HOB elevated     General bed mobility comments: min guard for roll to L, initially pt going to roll and push up to sit but unable. Mod assist for supine to sit for trunk elevation with assist of PT HHA  Transfers Overall transfer level: Needs assistance Equipment used: Rolling walker (2 wheeled) Transfers: Sit to/from Stand Sit to Stand: Min assist;From elevated surface          General transfer comment: Min assist for steadying upon standing, VERY elevated bed height to stand per pt request.  Ambulation/Gait Ambulation/Gait assistance: Min assist Gait Distance (Feet): 3 Feet Assistive device: Rolling walker (2 wheeled) Gait Pattern/deviations: Step-through pattern;Decreased stride length;Trunk flexed;Wide base of support;Shuffle Gait velocity: decr   General Gait Details: Min assist to steady, guide pt and RW. Pt taking small, shuffling steps to reach recliner  Stairs            Wheelchair Mobility    Modified Rankin (Stroke Patients Only)       Balance Overall balance assessment: Needs assistance;History of Falls Sitting-balance support: No upper extremity supported;Feet supported Sitting balance-Leahy Scale: Fair     Standing balance support: Bilateral upper extremity supported Standing balance-Leahy Scale: Poor Standing balance comment: reliant on external assist                             Pertinent Vitals/Pain Pain Assessment: Faces Faces Pain Scale: Hurts little more Pain Location: perineal area due to "rash", R knee and wrist s/p fall Pain Descriptors / Indicators: Sore;Discomfort Pain Intervention(s): Limited activity within patient's tolerance;Monitored during session;Repositioned    Home Living Family/patient expects to be discharged to:: Private residence Living Arrangements: Alone Available Help at Discharge: Personal care attendant;Available PRN/intermittently;Family(sister lives within driving distance) Type of Home: Mobile home Home Access: Stairs to enter Entrance Stairs-Rails: None Entrance Stairs-Number of Steps: 3 Home Layout: One level Home Equipment: [place] - 2 wheels;Bedside commode;Wheelchair - manual      Prior Function Level  of Independence: Needs assistance   Gait / Transfers Assistance Needed: pt reports very minimal walking after d/c from SNF setting on 5/14, states being at home by himself  was a struggle  ADL's / [place] Needed: pt reports sponge bathing, does not get into shower. States he was doing things for self PTA but was having a very difficult time  Comments: Pt lost his wife 06/2019 due to covid, states it has been a difficult year     Hand Dominance   Dominant Hand: Right    Extremity/Trunk Assessment   Upper Extremity Assessment Upper Extremity Assessment: Defer to OT evaluation    Lower Extremity Assessment Lower Extremity Assessment: Generalized weakness;LLE deficits/detail LLE Deficits / Details: able to perform ankle pump, limited ROM heel slide, quad set, Limited by pain LLE: Unable to fully assess due to pain    Cervical / Trunk Assessment Cervical / Trunk Assessment: Normal  Communication   Communication: No difficulties  Cognition Arousal/Alertness: Awake/alert Behavior During Therapy: Flat affect Overall Cognitive Status: Within Functional Limits for tasks assessed                                 General Comments: Pt A&Ox4, flat affect and appears disinterested suspect due to pt feeling down.      General Comments General comments (skin integrity, edema, etc.): R wrist edema noted s/p fall    Exercises     Assessment/Plan    PT Assessment Patient needs continued PT services  PT Problem List Decreased strength;Decreased mobility;Decreased activity tolerance;Decreased range of motion;Decreased balance;Decreased knowledge of use of DME;Pain;Obesity       PT Treatment Interventions DME instruction;Therapeutic activities;Gait training;Therapeutic exercise;Patient/family education;Balance training;Stair training;Functional mobility training;Neuromuscular re-education    PT Goals (Current goals can be found in the Care Plan section)  Acute Rehab PT Goals Patient Stated Goal: start walking again PT Goal Formulation: With patient Time For Goal Achievement: 10/25/19 Potential to Achieve Goals: Good     Frequency Min 2X/week   Barriers to discharge        Co-evaluation               AM-PAC PT "6 Clicks" Mobility  Outcome Measure Help needed turning from your back to your side while in a flat bed without using bedrails?: A Lot Help needed moving from lying on your back to sitting on the side of a flat bed without using bedrails?: A Lot Help needed moving to and from a bed to a chair (including a wheelchair)?: A Lot Help needed standing up from a chair using your arms (e.g., wheelchair or bedside chair)?: A Little Help needed to walk in hospital room?: A Lot Help needed climbing 3-5 steps with a railing? : Total 6 Click Score: 12    End of Session Equipment Utilized During Treatment: Gait belt Activity Tolerance: Patient tolerated treatment well;Patient limited by fatigue Patient left: in chair;with call bell/phone within reach;with chair alarm set Nurse Communication: Mobility status PT Visit Diagnosis: Other abnormalities of gait and mobility (R26.89);Muscle weakness (generalized) (M62.81);History of falling (Z91.81)    Time: 7672-0947 PT Time Calculation (min) (ACUTE ONLY): 18 min   Charges:   PT Evaluation $PT Eval Low Complexity: 1 Low          Letina Luckett E, PT Acute Rehabilitation Services Pager [phone]  Office [phone]  Makayia Duplessis D Elonda Husky 10/11/2019, 10:28 AM

## 2019-10-11 NOTE — Progress Notes (Signed)
Subjective: Pt is a 54-yr-old man with PMH of OSA, PAF, HTN, and T2DM who presented to the hospital complaining of a fall and generalized weakness. He reports nausea and heaving this morning but no vomiting. Pt has been unable to eat due to nausea. Pt had a BM last night. Able to walk minimally  Objective:  Vital signs in last 24 hours: Vitals:   10/11/19 0500 10/11/19 0825 10/11/19 0900 10/11/19 0938  BP:    123/68  Pulse:      Resp: 15 18  17   Temp:    98.6 F (37 C)  TempSrc:    Oral  SpO2:   99% 100%  Weight:      Height:       General: Pt is well-appearing and in NAD HEENT: [place]/AT. Grossly EOMI Cardiac: RRR. No cardiac heaves. No thrills palpated Respiratory: Lungs are CTA bilaterally. No wheezes, rales, or rhonchi Abdomen: Abdomen is soft and non-tender Extremities: spontaneously moving all extremities Neuro: No gross neurologic abnormalities noted. Normal affect Skin: warm and dry  CMP Latest Ref Rng & Units 10/11/2019 10/10/2019 10/10/2019  Glucose 70 - 99 mg/dL 124(H) 83 77  BUN 6 - 20 mg/dL 8 11 12   Creatinine 0.61 - 1.24 mg/dL 0.93 1.05 0.91  Sodium 135 - 145 mmol/L 130(L) 131(L) 131(L)  Potassium 3.5 - 5.1 mmol/L 3.8 4.3 4.3  Chloride 98 - 111 mmol/L 95(L) 94(L) 94(L)  CO2 22 - 32 mmol/L 21(L) 16(L) 14(L)  Calcium 8.9 - 10.3 mg/dL 8.7(L) 8.9 9.2  Total Protein 6.5 - 8.1 g/dL - - 9.2(H)  Total Bilirubin 0.3 - 1.2 mg/dL - - 1.2  Alkaline Phos 38 - 126 U/L - - 82  AST 15 - 41 U/L - - 17  ALT 0 - 44 U/L - - 14    Ref Range & Units 1 d ago  Phosphorus 2.5 - 4.6 mg/dL 3.6     Ref Range & Units 1 d ago  Acetone, blood 0.000 - 0.010 g/dL 0.022 High      Ref Range & Units 1 d ago 1 mo ago  Salicylate Lvl 7.0 - [id number] mg/dL <7.0 Low   <7.0Low    Assessment/Plan:  Active Problems:   Weakness  Crosby Gara is a 4-yr-old man with PMH of OSA, PAF, HTN, and T2DM who presented to the hospital after a fall. He was found to have an abnormal EKG and HAGMA. Pt is able  to ambulate minimally, and X-rays are negative for fracture.  Fall with right knee and wrist pain: Pt able to walk minimally, but ambulation difficult. X-rays negative for fracture. PT recommending SNF level of care as pt's home situation is difficult. -Ibuprofen 600 mg q6h PRN  HAGMA Nausea: On admission, lactic acid was 1.8, glucose 77, urine positive for ketones 20 mg/dL, and anion gap was 23 with appropriate delta delta. Today, beta-hydroxybutyric acid 2.86 from 6.48 yesterday. Salicylates < 7.0. Labs support diagnosis of ketoacidosis. Given initial elevated anion gap metabolic acidosis, diagnoses high on the differential are starvation ketoacidosis, alcoholic ketoacidosis, and euglycemic DKA. Normal lactic acid and normal blood EtOH make alcoholic ketoacidosis unlikley. Pt is not on an SGLT-2 inhibitor, making euglycemia DKA less likely. Starvation ketoacidosis is the most likely cause of HAGMA given pt's decreased appetite and poor oral intake. Anion gap has improved to 14 today. Poor appetite and history of diabetes concerning for gastroparesis. -Recheck CMP -Treat empirically with metoclopramide -Outpatient follow up for gastric emptying study  Generalized  weakness EKG abnormalities: Pt reports that generalized weakness is chronic and began after Afib diagnosis. On EKG, multiple PVCs and PACs and constant PR interval prologation were noted, suspicious for first degree AV block. Echocardiogram indicated to assess for cardiac structural abnormalities -Avoid nodal blocking agents  -Follow up echo results   Hyponatremia: Sodium low at 130. Low sodium may be attributable to poor appetite. -Recheck CMP  Protein gap: Last protein gap 30. 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 Calcium low-normal at 8.7. -Recheck CMP  Major depressive disorder: On admission, pt reported insomnia, anhedonia, and 20 lb weight loss over the past month. Pt requested switch from full code to DNR because he did not want to be a burden  to his family, suggesting depressed mood and thoughts of death. He meets criteria for MDD. Pt has multiple life stressors including recent loss of fiance to COVID-19 and disability that are likely contributing to this issue. -Fluoxetine 20 mg QD  HTN: -Amlodipine 10 mg QD -Lisinopril 20 mg QD  T2DM: -SSI  FEN/GI:  -Carb modified diet  DVT prophylaxis:  -IV Lovenox 70 mg SQ  CODE STATUS: DNR  Prior to Admission Living Arrangement: Home Anticipated Discharge Location: Home Barriers to Discharge: Poor living situation Dispo: Anticipated discharge in greater than 2 midnights  Josepha Pigg, Medical Student 10/11/2019, 1:51 PM Pager: [phone]

## 2019-10-12 DIAGNOSIS — E785 Hyperlipidemia, unspecified: Secondary | ICD-10-CM | POA: Diagnosis present

## 2019-10-12 DIAGNOSIS — I441 Atrioventricular block, second degree: Secondary | ICD-10-CM | POA: Diagnosis present

## 2019-10-12 DIAGNOSIS — D539 Nutritional anemia, unspecified: Secondary | ICD-10-CM | POA: Diagnosis present

## 2019-10-12 DIAGNOSIS — K3184 Gastroparesis: Secondary | ICD-10-CM | POA: Diagnosis present

## 2019-10-12 DIAGNOSIS — E872 Acidosis: Secondary | ICD-10-CM | POA: Diagnosis not present

## 2019-10-12 DIAGNOSIS — Z6841 Body Mass Index (BMI) 40.0 and over, adult: Secondary | ICD-10-CM | POA: Diagnosis not present

## 2019-10-12 DIAGNOSIS — K5900 Constipation, unspecified: Secondary | ICD-10-CM | POA: Diagnosis present

## 2019-10-12 DIAGNOSIS — I480 Paroxysmal atrial fibrillation: Secondary | ICD-10-CM | POA: Diagnosis present

## 2019-10-12 DIAGNOSIS — Z20822 Contact with and (suspected) exposure to covid-19: Secondary | ICD-10-CM | POA: Diagnosis present

## 2019-10-12 DIAGNOSIS — G4733 Obstructive sleep apnea (adult) (pediatric): Secondary | ICD-10-CM | POA: Diagnosis present

## 2019-10-12 DIAGNOSIS — Z66 Do not resuscitate: Secondary | ICD-10-CM | POA: Diagnosis not present

## 2019-10-12 DIAGNOSIS — E878 Other disorders of electrolyte and fluid balance, not elsewhere classified: Secondary | ICD-10-CM | POA: Diagnosis present

## 2019-10-12 DIAGNOSIS — E1143 Type 2 diabetes mellitus with diabetic autonomic (poly)neuropathy: Secondary | ICD-10-CM | POA: Diagnosis present

## 2019-10-12 DIAGNOSIS — I493 Ventricular premature depolarization: Secondary | ICD-10-CM | POA: Diagnosis present

## 2019-10-12 DIAGNOSIS — E871 Hypo-osmolality and hyponatremia: Secondary | ICD-10-CM | POA: Diagnosis present

## 2019-10-12 DIAGNOSIS — R110 Nausea: Secondary | ICD-10-CM | POA: Diagnosis present

## 2019-10-12 DIAGNOSIS — I440 Atrioventricular block, first degree: Secondary | ICD-10-CM | POA: Diagnosis present

## 2019-10-12 DIAGNOSIS — I10 Essential (primary) hypertension: Secondary | ICD-10-CM | POA: Diagnosis present

## 2019-10-12 DIAGNOSIS — F329 Major depressive disorder, single episode, unspecified: Secondary | ICD-10-CM | POA: Diagnosis present

## 2019-10-12 DIAGNOSIS — I491 Atrial premature depolarization: Secondary | ICD-10-CM | POA: Diagnosis present

## 2019-10-12 DIAGNOSIS — K7030 Alcoholic cirrhosis of liver without ascites: Secondary | ICD-10-CM | POA: Diagnosis present

## 2019-10-12 DIAGNOSIS — J45909 Unspecified asthma, uncomplicated: Secondary | ICD-10-CM | POA: Diagnosis present

## 2019-10-12 DIAGNOSIS — R634 Abnormal weight loss: Secondary | ICD-10-CM | POA: Diagnosis present

## 2019-10-12 LAB — COMPREHENSIVE METABOLIC PANEL
ALT: 8 U/L (ref 0–44)
AST: 10 U/L — ABNORMAL LOW (ref 15–41)
Albumin: 2.6 g/dL — ABNORMAL LOW (ref 3.5–5.0)
Alkaline Phosphatase: 61 U/L (ref 38–126)
Anion gap: 9 (ref 5–15)
BUN: 11 mg/dL (ref 6–20)
CO2: 25 mmol/L (ref 22–32)
Calcium: 8.7 mg/dL — ABNORMAL LOW (ref 8.9–10.3)
Chloride: 96 mmol/L — ABNORMAL LOW (ref 98–111)
Creatinine, Ser: 0.99 mg/dL (ref 0.61–1.24)
GFR calc Af Amer: >60 mL/min (ref >60)
GFR calc non Af Amer: >60 mL/min (ref >60)
Glucose, Bld: 157 mg/dL — ABNORMAL HIGH (ref 70–99)
Potassium: 3.6 mmol/L (ref 3.5–5.1)
Sodium: 130 mmol/L — ABNORMAL LOW (ref 135–145)
Total Bilirubin: 0.6 mg/dL (ref 0.3–1.2)
Total Protein: 6.8 g/dL (ref 6.5–8.1)

## 2019-10-12 LAB — URINE CULTURE: Culture: 20000 — AB

## 2019-10-12 LAB — GLUCOSE, CAPILLARY
Glucose-Capillary: 127 mg/dL — ABNORMAL HIGH (ref 70–99)
Glucose-Capillary: 139 mg/dL — ABNORMAL HIGH (ref 70–99)
Glucose-Capillary: 167 mg/dL — ABNORMAL HIGH (ref 70–99)

## 2019-10-12 MED ORDER — POLYETHYLENE GLYCOL 3350 17 G PO PACK: 17.0000 g | PACK | Freq: Every day | ORAL | 0 refills | Status: DC | PRN

## 2019-10-12 MED ORDER — FLUOXETINE HCL 20 MG PO CAPS: 20.0000 mg | ORAL_CAPSULE | Freq: Every day | ORAL | 0 refills | Status: DC

## 2019-10-12 MED ORDER — PROMETHAZINE HCL 12.5 MG PO TABS: 12.5000 mg | ORAL_TABLET | Freq: Four times a day (QID) | ORAL | 0 refills | Status: DC | PRN

## 2019-10-12 MED FILL — POLYETHYLENE GLYCOL 3350 PO: 17 | 14 days supply | Qty: 238 | Fill #0

## 2019-10-12 MED FILL — FLUoxetine HCL 20 MG CAPS: 20 | 30 days supply | Qty: 30 | Fill #0

## 2019-10-12 MED FILL — PROMETHAZINE 12.5 MG TABLET: 12.5 | 7 days supply | Qty: 30 | Fill #0

## 2019-10-12 NOTE — Discharge Summary (Signed)
Name: Allen Gould MRN: [id number] DOB: 08-Oct-1965 54 y.o. PCP: [name], MD  Date of Admission: 10/10/2019  6:56 AM Date of Discharge: 10/12/2019 Attending Physician: Velna Ochs, MD  Discharge Diagnosis: 1. High Anion Gap Metabolic Acidosis Secondary to Starvation Ketosis 2. 1st Degree AV Block  3. Major Depressive Disorder 4. Type II Diabetes Mellitis   Discharge Medications: Allergies as of 10/12/2019      Reactions   Shellfish Allergy Hives      Medication List    STOP taking these medications   carvedilol 12.5 MG tablet Commonly known as: COREG   metFORMIN 1000 MG tablet Commonly known as: GLUCOPHAGE     TAKE these medications   amLODipine 10 MG tablet Commonly known as: NORVASC Take 10 mg by mouth daily.   aspirin 325 MG tablet Take 325 mg by mouth daily as needed for moderate pain.   atorvastatin 40 MG tablet Commonly known as: LIPITOR Take 1 tablet (40 mg total) by mouth daily at 6 PM.   famotidine 10 MG tablet Commonly known as: PEPCID Take 1 tablet (10 mg total) by mouth 2 (two) times daily. What changed:   how much to take  when to take this   FLUoxetine 20 MG capsule Commonly known as: PROZAC Take 1 capsule (20 mg total) by mouth daily. Start taking on: Oct 12, 7844   folic acid 1 MG tablet Commonly known as: FOLVITE Take 1 tablet (1 mg total) by mouth daily.   gabapentin 300 MG capsule Commonly known as: NEURONTIN Take 1 capsule by mouth in the morning, at noon, and at bedtime.   Glucagon Emergency 1 MG/ML Solr Inject 1 mg as directed daily as needed. Give for cbg less than 60 and unable to consume glucose replacment   insulin aspart protamine- aspart (70-30) 100 UNIT/ML injection Commonly known as: NOVOLOG MIX 70/30 Inject 0.2 mLs (20 Units total) into the skin 2 (two) times daily with a meal.   polyethylene glycol 17 g packet Commonly known as: MIRALAX / GLYCOLAX Take 17 g by mouth daily as needed for mild  constipation.   promethazine 12.5 MG tablet Commonly known as: PHENERGAN Take 1 tablet (12.5 mg total) by mouth every 6 (six) hours as needed for nausea.   vitamin B-12 1000 MCG tablet Commonly known as: CYANOCOBALAMIN Take 1,000 mcg by mouth daily.   Voltaren 1 % Gel Generic drug: diclofenac Sodium Apply 2 g topically 4 (four) times daily.       Disposition and follow-up:   Allen Gould was discharged from [facility] in Stable condition.  At the hospital follow up visit please address:  1.  Diabetes Medication compliance, Blood pressure management, Major Depression Disorder medication optimization, Anemia Workup.  2.  Labs / imaging needed at time of follow-up: None  3.  Pending labs/ test needing follow-up: None   Follow-up Appointments:  Contact information for follow-up providers    [name], MD. Schedule an appointment as soon as possible for a visit in 1 week(s).   Specialty: Cardiology Contact information: [address] [address] [phone]            Contact information for after-discharge care    Destination    [facility] .   Service: Skilled Nursing Contact information: [address] [place] [facility] Course  by problem list:  HAGMA 2/2 to Starvation Ketosis Patient, with history of diabetes and not on an SGLT-2 inhibitor, presented to the emergency department with generalized weakness and nausea.He had a fall the previous Friday and called EMS, who helped him into his recliner. Since Saturday, the patient had not moved from his recliner, and ate a handful of nuts and "red hot" sausages that were close to his recliner. He was found to have a high anion gap, increased beta-hydroxybutyric acid, hypochloremia, and hyponatremia  during his ED course and was consulted for admission. His high anion gap was likely caused by  starvation ketosis. After eating his anion gap resolved, patient endorsed feeling more energized, and his nausea abated. Patient discharged in stable condition.   1st Degree AV Block:  Patient reports that generalized weakness is chronic and began after Afib diagnosis. On EKG, patient had premature ventricular contractions and premature atrial contractions with PR interval prologation suspicious for first degree AV block. Echocardiogram indicated to assess for cardiac structural abnormalities. It was recommended that he avoid nodal blocking agents.   Major Depressive Disorder:  On admission, patient reported decreased energy, insomnia, anhedonia, feelings of worthlessness, 20 pound weight loss, and depressed mood. These feelings began in 07/17/19 after the death of his fiance, and socioeconomic hardships. Patient states that he was placed on Zoloft during a recent hospitalization which made his see and hear things that were not present in the room, and was discontinued. He was started on Prozac during his hospitalization. He was discharged in stable condition with Prozac.   T2DM: On admission, patient with a history of diabetes, on insulin, presented with fatigue, generalized weakness, and has been holding off his home insulin dose. Patient endorses early satiety and having feelings that food sits in his stomach for prolonged periods of time. Due to his symptoms of early satiety and weight loss, it is concerning for gastroparesis, but patient does endorse weight loss, is anemic, and history of GERD which is also concerning for malignancy. It is recommended that he follow up in the outpatient setting for possible EGD and gastric emptying study. He was discharged with a referral to gastroenterology.   Discharge Vitals:   BP (!) 145/60 (BP Location: Left Arm)   Pulse (!) 51   Temp 98.6 F (37 C)   Resp 20   Ht 5' 10"  (1.778 m)   Wt (!) 140.2 kg   SpO2 97%   BMI 44.35 kg/m   Pertinent  Labs, Studies, and Procedures:    Ref Range & Units 1 d ago  (10/11/19) 2 d ago  (10/10/19) 2 d ago  (10/10/19) 2 wk ago  (09/26/19)  Sodium 135 - 145 mmol/L 130Low   131Low   131Low   132Low    Potassium 3.5 - 5.1 mmol/L 3.8  4.3  4.3  4.1   Chloride 98 - 111 mmol/L 95Low   94Low   94Low   100   CO2 22 - 32 mmol/L 21Low   16Low   14Low   24   Glucose, Bld 70 - 99 mg/dL 124High   83 CM  77 CM  91 CM   Comment: Glucose reference range applies only to samples taken after fasting for at least 8 hours.  BUN 6 - 20 mg/dL 8  11  12  9    Creatinine, Ser 0.61 - 1.24 mg/dL 0.93  1.05  0.91  0.84   Calcium 8.9 - 10.3 mg/dL 8.7Low   8.9  9.2  7.9Low    GFR calc non Af Amer >60 mL/min >60  >60  >60  >60   GFR calc Af Amer >60 mL/min >60  >60  >60  >60   Anion gap 5 - 15 14  21High  CM  23High  CM      Ref Range & Units 1 d ago 2 d ago  Beta-Hydroxybutyric Acid 0.05 - 0.27 mmol/L 2.86High   6.48High    Echocardiogram Complete 10/11/2019 FINDINGS  Left Ventricle: Left ventricular ejection fraction, by estimation, is 60  to 65%. The left ventricle has normal function. The left ventricle has no  regional wall motion abnormalities. Definity contrast agent was given IV  to delineate the left ventricular  endocardial borders. The left ventricular internal cavity size was normal  in size. There is moderate left ventricular hypertrophy. Left ventricular  diastolic parameters are indeterminate.   Right Ventricle: The right ventricular size is normal. No increase in  right ventricular wall thickness. Right ventricular systolic function is  normal. Tricuspid regurgitation signal is inadequate for assessing PA  pressure.   Left Atrium: Left atrial size was mildly dilated.   Right Atrium: Right atrial size was normal in size.   Pericardium: There is no evidence of pericardial effusion.   Mitral Valve: Severe calcification of anterior mitral leaflet along  aortomitral continuity. The mitral valve is  abnormal. There is severe  calcification of the mitral valve leaflet(s). Trivial mitral valve  regurgitation. Mild mitral valve stenosis.   Tricuspid Valve: The tricuspid valve is normal in structure. Tricuspid  valve regurgitation is not demonstrated. No evidence of tricuspid  stenosis.   Aortic Valve: The aortic valve is abnormal. Aortic valve regurgitation is  not visualized. Mild aortic stenosis is present. There is moderate  calcification of the aortic valve. Aortic valve mean gradient measures  12.0 mmHg. Aortic valve peak gradient  measures 21.3 mmHg. Aortic valve area, by VTI measures 1.78 cm.   Pulmonic Valve: The pulmonic valve was normal in structure. Pulmonic valve  regurgitation is mild. No evidence of pulmonic stenosis.   Aorta: Aortic dilatation noted. There is borderline dilatation of the  aortic root measuring 39 mm.   Venous: The inferior vena cava is normal in size with greater than 50%  respiratory variability, suggesting right atrial pressure of 3 mmHg.   IAS/Shunts: No atrial level shunt detected by color flow Doppler.   Discharge Instructions: Discharge Instructions    Ambulatory referral to Gastroenterology   Complete by: As directed    What is the reason for referral?: Other Comment - Gastric Emptying Study/EGD   Diet - low sodium heart healthy   Complete by: As directed    Increase activity slowly   Complete by: As directed       Signed: Maudie Mercury, MD 10/12/2019, 2:38 PM   Pager: [phone]

## 2019-10-12 NOTE — Discharge Instructions (Signed)
To [name],  It was a pleasure taking care of you during your hospitalization. During your stay, your electrolyte abnormalities were due to inadequate diet. Due to your feeling full after meals, please follow up with gastroenterology for a gastric emptying study to see if your symptoms are due to your diabetes. You have also been started on Prozac during your stay for your depression. Please follow up with your primary care provider to adjust your medications as needed. Due to your generalized weakness, which may be due to poor conditioning, you will be placed in a rehab center to continue to gain your strength. Please come back for reevaluation if you experience sudden weakness, or changes in your mental status.    Deconditioning Deconditioning refers to the changes in the body that occur during a period of inactivity. The changes happen in the heart, lungs, and muscles. They make you feel tired and weak (fatigued) and decrease your ability to be active. The three stages of deconditioning include:  Mild deconditioning. This is a change in your ability to do your usual exercise activities, such as running, biking, or swimming.  Moderate deconditioning. This is a change in your ability to do normal everyday activities, such as walking, shopping for groceries, and doing chores.  Severe deconditioning. In this stage, you may not be able to do minimal activity or usual self-care. What are the causes? Deconditioning can occur after only a few days of inactivity. The longer the period of inactivity, the more severe the deconditioning will be, and the longer it will take to return to your previous level of functioning. Deconditioning is often caused by inactivity due to:  Illnesses, such as cancer, stroke, heart attack, fibromyalgia, and chronic fatigue syndrome.  Injuries, especially back injuries, broken bones, and injuries to soft tissues, such as ligaments and tendons.  A long stay in the  hospital.  Pregnancy, especially if long periods of bed rest are needed. What increases the risk? The following factors may make you more likely to develop this condition:  Staying in the hospital or being on bed rest.  Obesity.  Poor nutrition.  Being an older adult.  Having an injury or illness that affects your movement and activity. What are the signs or symptoms? Symptoms of this condition include:  Weakness and tiredness.  Shortness of breath with minor physical effort (exertion).  A heartbeat that is faster than normal. You may not notice this without taking your pulse.  Pain or discomfort with activity.  Decreased strength, endurance, and balance.  Difficulty doing your usual forms of exercise.  Difficulty doing activities of daily living, such as grocery shopping or chores. You may also have problems walking around the house and doing basic self-care, such as getting to the bathroom, preparing meals, or doing laundry. How is this diagnosed? This condition is diagnosed based on your medical history and a physical exam. During the physical exam, your health care provider will check for signs of deconditioning, such as:  Decreased size of muscles.  Decreased strength.  Trouble with balance.  Shortness of breath or a heart rate that is faster than normal after minor exertion. How is this treated? Treatment for this condition involves an exercise program in which activity is increased slowly. Your health care provider will tell you which exercises are right for you. The exercise program will likely include:  Aerobic exercise. This type of exercise helps improve the functioning of the heart, lungs, and muscles.  Strength training. This type of  exercise helps increase muscle size and strength. Both of these types of exercise will improve your endurance. You may be referred to a physical therapist who can create a safe strengthening program for you to follow. Follow  these instructions at home: Eating and drinking   Eat a healthy, well-balanced diet. This includes: ? Proteins, such as lean meats and fish, to build muscles. ? Fresh fruits and vegetables. ? Carbohydrates, such as whole grains, to boost energy.  Drink enough fluid to keep your urine pale yellow. Activity   Follow the exercise program that is recommended by your health care provider or physical therapist.  Do not increase your exercise any faster than directed. General instructions  Take over-the-counter and prescription medicines only as told by your health care provider.  Do not use any products that contain nicotine or tobacco, such as cigarettes, e-cigarettes, and chewing tobacco. If you need help quitting, ask your health care provider.  Keep all follow-up visits as told by your health care provider. This is important. Contact a health care provider if:  You are not able to do the recommended exercise program.  You are becoming more and more tired and weak.  You become light-headed when rising to a sitting or standing position.  Your level of endurance decreases after it has improved. Get help right away if you:  Have chest pain.  Are very short of breath.  Have any episodes of fainting. Summary  Deconditioning refers to the changes in the body that occur during a period of inactivity.  Deconditioning happens in the heart, lungs, and muscles. The changes make you feel tired and weak and decrease your ability to be active.  Treatment for deconditioning involves an exercise program in which activity is increased slowly. This information is not intended to replace advice given to you by your health care provider. Make sure you discuss any questions you have with your health care provider. Document Revised: 10/05/2018 Document Reviewed: 10/05/2018 Elsevier Patient Education  [place].    Fatigue If you have fatigue, you feel tired all the time and have  a lack of energy or a lack of motivation. Fatigue may make it difficult to start or complete tasks because of exhaustion. In general, occasional or mild fatigue is often a normal response to activity or life. However, long-lasting (chronic) or extreme fatigue may be a symptom of a medical condition. Follow these instructions at home: General instructions  Watch your fatigue for any changes.  Go to bed and get up at the same time every day.  Avoid fatigue by pacing yourself during the day and getting enough sleep at night.  Maintain a healthy weight. Medicines  Take over-the-counter and prescription medicines only as told by your health care provider.  Take a multivitamin, if told by your health care provider.  Do not use herbal or dietary supplements unless they are approved by your health care provider. Activity   Exercise regularly, as told by your health care provider.  Use or practice techniques to help you relax, such as yoga, tai chi, meditation, or massage therapy. Eating and drinking   Avoid heavy meals in the evening.  Eat a well-balanced diet, which includes lean proteins, whole grains, plenty of fruits and vegetables, and low-fat dairy products.  Avoid consuming too much caffeine.  Avoid the use of alcohol.  Drink enough fluid to keep your urine pale yellow. Lifestyle  Change situations that cause you stress. Try to keep your work and personal  schedule in balance.  Do not use any products that contain nicotine or tobacco, such as cigarettes and e-cigarettes. If you need help quitting, ask your health care provider.  Do not use drugs. Contact a health care provider if:  Your fatigue does not get better.  You have a fever.  You suddenly lose or gain weight.  You have headaches.  You have trouble falling asleep or sleeping through the night.  You feel angry, guilty, anxious, or sad.  You are unable to have a bowel movement (constipation).  Your skin  is dry.  You have swelling in your legs or another part of your body. Get help right away if:  You feel confused.  Your vision is blurry.  You feel faint or you pass out.  You have a severe headache.  You have severe pain in your abdomen, your back, or the area between your waist and hips (pelvis).  You have chest pain, shortness of breath, or an irregular or fast heartbeat.  You are unable to urinate, or you urinate less than normal.  You have abnormal bleeding, such as bleeding from the rectum, vagina, nose, lungs, or nipples.  You vomit blood.  You have thoughts about hurting yourself or others. If you ever feel like you may hurt yourself or others, or have thoughts about taking your own life, get help right away. You can go to your nearest emergency department or call:  Your local emergency services (911 in the [place]  A suicide crisis helpline, such as the [facility] at (726)795-7345. This is open 24 hours a day. Summary  If you have fatigue, you feel tired all the time and have a lack of energy or a lack of motivation.  Fatigue may make it difficult to start or complete tasks because of exhaustion.  Long-lasting (chronic) or extreme fatigue may be a symptom of a medical condition.  Exercise regularly, as told by your health care provider.  Change situations that cause you stress. Try to keep your work and personal schedule in balance. This information is not intended to replace advice given to you by your health care provider. Make sure you discuss any questions you have with your health care provider. Document Revised: 11/29/2018 Document Reviewed: 02/02/2017 Elsevier Patient Education  [place].    Metabolic Acidosis Metabolic acidosis is a condition in which there is too much acid in the blood. It happens when certain chemicals in the body's cells are too high or too low. This condition can happen at any age, and there are  many different causes. It may be a symptom of a sudden, short-term (acute) condition, or the result of a long-term (chronic) condition. Metabolic acidosis can be mild or it can be severe and life-threatening, depending on the cause. It can be reversed if the cause is identified and properly treated. This condition is a medical emergency. What are the causes? This condition may be caused by:  The body producing too much acid.  Losing too much of a chemical that balances acid levels (bicarbonate). This can result from diarrhea or from certain surgeries on the stomach and intestines.  Extra buildup of acidic chemicals (ketone bodies) in the blood due to untreated type 1 diabetes.  Extra buildup of a chemical (lactic acid) that forms when the body is low on oxygen. This can result from: ? The heart, lungs, liver, kidneys, or brain not getting enough blood, oxygen, and nutrients (shock). ? Intense physical activity. ?  Certain diseases. ? Serious infections.  Exposure to a poisonous substance (toxin), such as: ? Carbon monoxide. ? Cyanide. ? Antifreeze (ethylene glycol). ? Methanol.  Certain medicines, such as acetazolamide or large doses of aspirin.  Kidney disease or kidney infection.  Too much iron in the body.  Excessive alcohol use.  Conditions that cause you to have a low level of red blood cells (anemia). What are the signs or symptoms? Symptoms of this condition vary depending on the cause and severity. Common symptoms include:  Rapid breathing.  Difficulty breathing.  Nausea or vomiting.  Headache.  Confusion.  Weakness.  Feeling restless.  Feeling drowsy and emotionless (lethargy). Mild acidosis may not cause any symptoms. Severe acidosis can result in shock, coma, or death. How is this diagnosed? This condition may be diagnosed based on:  A physical exam.  Your medical history.  Blood tests. These may include: ? An arterial blood gas (ABG) test or a  venous blood gas (VBG) test. These tests measure the acidity levels (pH balance) of your blood. ? A serum electrolytes test. This test measures the amounts of minerals in your blood.  Urine tests. How is this treated? Treatment for metabolic acidosis depends on the underlying condition and the severity of symptoms. The goal of treatment is to balance the amount of acid in the body and to treat the underlying cause. Mild metabolic acidosis may be treated with:  Fluids given through an IV.  Medicines.  Close monitoring with blood tests. Severe forms of metabolic acidosis may require:  Hospitalization and close monitoring with blood tests.  Medicines or procedures.  Bicarbonates. These may be given through an IV if your condition is very severe. Follow these instructions at home:   Take over-the-counter and prescription medicines only as told by your health care provider.  If you were prescribed an antibiotic medicine, take it as told by your health care provider. Do not stop taking the antibiotic even if you start to feel better.  Drink enough fluid to keep your urine pale yellow.  Do not drink alcohol.  Follow instructions from your health care provider about eating or drinking restrictions.  If you have diabetes, check your urine for ketones when you are ill and as told by your health care provider.  Pay attention to your symptoms for any changes.  Keep all follow-up visits as told by your health care provider. This is important. Contact a health care provider if you have:  Any new symptoms.  Side effects from medicines you are taking.  Nausea, vomiting, or diarrhea that does not get better with medicine or that gets worse.  Body aches or lethargy that gets worse.  Dark urine or you urinate less often than you usually do.  A decreased appetite.  A fever. Get help right away if you:  Have shortness of breath, chest pain, or a fast heartbeat  (palpitations).  Feel like you are losing consciousness, or you faint.  Feel drowsy or confused.  Have severe pain in your joints and limbs.  Have severe abdominal pain. These symptoms may represent a serious problem that is an emergency. Do not wait to see if the symptoms will go away. Get medical help right away. Call your local emergency services (911 in the [place] Do not drive yourself to the hospital. Summary  Metabolic acidosis is a condition in which there is too much acid in the blood. It happens because of a chemical imbalance in your cells in the body.  Metabolic acidosis can be mild or it can be severe and life-threatening, depending on the cause. Metabolic acidosis can be corrected if the cause is identified and properly treated.  If you are at risk for this condition, be aware of symptoms of metabolic acidosis.  Metabolic acidosis is a medical emergency. This information is not intended to replace advice given to you by your health care provider. Make sure you discuss any questions you have with your health care provider. Document Revised: 06/28/2017 Document Reviewed: 06/28/2017 Elsevier Patient Education  [place].    Nausea, Adult Nausea is feeling sick to your stomach or feeling that you are about to throw up (vomit). Feeling sick to your stomach is usually not serious, but it may be an early sign of a more serious medical problem. As you feel sicker to your stomach, you may throw up. If you throw up, or if you are not able to drink enough fluids, there is a risk that you may lose too much water in your body (get dehydrated). If you lose too much water in your body, you may:  Feel tired.  Feel thirsty.  Have a dry mouth.  Have cracked lips.  Go pee (urinate) less often. Older adults and people who have other diseases or a weak body defense system (immune system) have a higher risk of losing too much water in the body. The main goals of treating this  condition are:  To relieve your nausea.  To ensure your nausea occurs less often.  To prevent throwing up and losing too much fluid. Follow these instructions at home: Watch your symptoms for any changes. Tell your doctor about them. Follow these instructions as told by your doctor. Eating and drinking      Take an ORS (oral rehydration solution). This is a drink that is sold at pharmacies and stores.  Drink clear fluids in small amounts as you are able. These include: ? Water. ? Ice chips. ? Fruit juice that has water added (diluted fruit juice). ? Low-calorie sports drinks.  Eat bland, easy-to-digest foods in small amounts as you are able, such as: ? Bananas. ? Applesauce. ? Rice. ? Low-fat (lean) meats. ? Toast. ? Crackers.  Avoid drinking fluids that have a lot of sugar or caffeine in them. This includes energy drinks, sports drinks, and soda.  Avoid alcohol.  Avoid spicy or fatty foods. General instructions  Take over-the-counter and prescription medicines only as told by your doctor.  Rest at home while you get better.  Drink enough fluid to keep your pee (urine) pale yellow.  Take slow and deep breaths when you feel sick to your stomach.  Avoid food or things that have strong smells.  Wash your hands often with soap and water. If you cannot use soap and water, use hand sanitizer.  Make sure that all people in your home wash their hands well and often.  Keep all follow-up visits as told by your doctor. This is important. Contact a doctor if:  You feel sicker to your stomach.  You feel sick to your stomach for more than 2 days.  You throw up.  You are not able to drink fluids without throwing up.  You have new symptoms.  You have a fever.  You have a headache.  You have muscle cramps.  You have a rash.  You have pain while peeing.  You feel light-headed or dizzy. Get help right away if:  You have pain in your chest,  neck, arm, or  jaw.  You feel very weak or you pass out (faint).  You have throw up that is bright red or looks like coffee grounds.  You have bloody or black poop (stools) or poop that looks like tar.  You have a very bad headache, a stiff neck, or both.  You have very bad pain, cramping, or bloating in your belly (abdomen).  You have trouble breathing or you are breathing very quickly.  Your heart is beating very quickly.  Your skin feels cold and clammy.  You feel confused.  You have signs of losing too much water in your body, such as: ? Dark pee, very little pee, or no pee. ? Cracked lips. ? Dry mouth. ? Sunken eyes. ? Sleepiness. ? Weakness. These symptoms may be an emergency. Do not wait to see if the symptoms will go away. Get medical help right away. Call your local emergency services (911 in the [place] Do not drive yourself to the hospital. Summary  Nausea is feeling sick to your stomach or feeling that you are about to throw up (vomit).  If you throw up, or if you are not able to drink enough fluids, there is a risk that you may lose too much water in your body (get dehydrated).  Eat and drink what your doctor tells you. Take over-the-counter and prescription medicines only as told by your doctor.  Contact a doctor right away if your symptoms get worse or you have new symptoms.  Keep all follow-up visits as told by your doctor. This is important. This information is not intended to replace advice given to you by your health care provider. Make sure you discuss any questions you have with your health care provider. Document Revised: 10/18/2017 Document Reviewed: 10/18/2017 Elsevier Patient Education  [place].

## 2019-10-12 NOTE — TOC Transition Note (Signed)
Transition of Care [facility]) - CM/SW Discharge Note   Patient Details  Name: Allen Gould MRN: [id number] Date of Birth: [birth year]  [facility] CM/SW Contact:  Angelita Ingles, RN Phone Number: [phone]  10/12/2019, 2:38 PM   Clinical Narrative:    Patient choose bed at [facility]. MD has been made aware and orders will be entered. [facility] able to accept patient today. Patient has been updated and does not want CM to call his sister or family to update them. Patient states that he will update family. Bedside nurse has been made aware of discharge. [facility] notified for transportation.Transportation set up. Discharge packet placed in patients chart.   Please call report to : [facility]  [phone] Room # 206   Final next level of care: Skilled Nursing Facility Barriers to Discharge: No Barriers Identified   Patient Goals and CMS Choice Patient states their goals for this hospitalization and ongoing recovery are:: To get better CMS Medicare.gov Compare Post Acute Care list provided to:: Patient Choice offered to / list presented to : Patient  Discharge Placement   Existing PASRR number confirmed : 10/11/19          Patient chooses bed at: [facility] Patient to be transferred to facility by: [place] Name of family member notified: Patient made aware but declined to allow CM notify family    Discharge Plan and Services                DME Arranged: N/A DME Agency: NA       HH Arranged: NA HH Agency: NA        Social Determinants of Health (SDOH) Interventions     Readmission Risk Interventions Readmission Risk Prevention Plan 09/03/2019  Post Dischage Appt Not Complete  Appt Comments pt states he switched his Medicaid to [facility], was seeing [facility]  Medication Screening Complete  Transportation Screening Complete  Some recent data might be hidden

## 2019-10-12 NOTE — Progress Notes (Addendum)
Subjective:  O/N: None   Patient resting comfortably in bed. He states that he is tolerating food but has not had a bowel movement recently. He denies any worsening abdominal pain, nausea or vomiting. He states he feeling better. We discussed recommendations from PT and OT on SNF placement. Patient voices agreement.    Objective:  Vital signs in last 24 hours: Vitals:   10/11/19 1542 10/11/19 2100 10/12/19 0001 10/12/19 0429  BP: 123/70 (!) 111/45 (!) 122/59 132/69  Pulse: (!) 53 (!) 47 (!) 50 60  Resp: 19 17 19    Temp: 99 F (37.2 C) 98 F (36.7 C)  98 F (36.7 C)  TempSrc:  Oral  Oral  SpO2: 100% 97% 100% 98%  Weight:      Height:       Physical Exam Constitutional:      General: He is not in acute distress.    Appearance: He is obese. He is not ill-appearing or toxic-appearing.  HENT:     Head: Normocephalic and atraumatic.  Cardiovascular:     Rate and Rhythm: Regular rhythm. Bradycardia present.     Heart sounds: No murmur. No friction rub. No gallop.   Pulmonary:     Effort: Pulmonary effort is normal.     Breath sounds: Normal breath sounds. No wheezing, rhonchi or rales.  Abdominal:     General: Abdomen is flat. Bowel sounds are normal.     Tenderness: There is no abdominal tenderness. There is no guarding.  Skin:    General: Skin is warm and dry.  Neurological:     Mental Status: He is alert and oriented to person, place, and time.  Psychiatric:        Mood and Affect: Mood normal.        Behavior: Behavior normal.     CMP Latest Ref Rng & Units 10/12/2019 10/11/2019 10/10/2019  Glucose 70 - 99 mg/dL 157(H) 124(H) 83  BUN 6 - 20 mg/dL 11 8 11   Creatinine 0.61 - 1.24 mg/dL 0.99 0.93 1.05  Sodium 135 - 145 mmol/L 130(L) 130(L) 131(L)  Potassium 3.5 - 5.1 mmol/L 3.6 3.8 4.3  Chloride 98 - 111 mmol/L 96(L) 95(L) 94(L)  CO2 22 - 32 mmol/L 25 21(L) 16(L)  Calcium 8.9 - 10.3 mg/dL 8.7(L) 8.7(L) 8.9  Total Protein 6.5 - 8.1 g/dL 6.8 - -  Total Bilirubin 0.3  - 1.2 mg/dL 0.6 - -  Alkaline Phos 38 - 126 U/L 61 - -  AST 15 - 41 U/L 10(L) - -  ALT 0 - 44 U/L 8 - -    Ref Range & Units 1 d ago  Phosphorus 2.5 - 4.6 mg/dL 3.6     Ref Range & Units 1 d ago  Acetone, blood 0.000 - 0.010 g/dL 0.022 High      Ref Range & Units 1 d ago 1 mo ago  Salicylate Lvl 7.0 - 65.9 mg/dL <7.0 Low   <7.0Low    FINDINGS  Left Ventricle: Left ventricular ejection fraction, by estimation, is 60  to 65%. The left ventricle has normal function. The left ventricle has no  regional wall motion abnormalities. Definity contrast agent was given IV  to delineate the left ventricular  endocardial borders. The left ventricular internal cavity size was normal  in size. There is moderate left ventricular hypertrophy. Left ventricular  diastolic parameters are indeterminate.   Right Ventricle: The right ventricular size is normal. No increase in  right ventricular wall thickness.  Right ventricular systolic function is  normal. Tricuspid regurgitation signal is inadequate for assessing PA  pressure.   Left Atrium: Left atrial size was mildly dilated.   Right Atrium: Right atrial size was normal in size.   Pericardium: There is no evidence of pericardial effusion.   Mitral Valve: Severe calcification of anterior mitral leaflet along  aortomitral continuity. The mitral valve is abnormal. There is severe  calcification of the mitral valve leaflet(s). Trivial mitral valve  regurgitation. Mild mitral valve stenosis.   Tricuspid Valve: The tricuspid valve is normal in structure. Tricuspid  valve regurgitation is not demonstrated. No evidence of tricuspid  stenosis.   Aortic Valve: The aortic valve is abnormal. Aortic valve regurgitation is  not visualized. Mild aortic stenosis is present. There is moderate  calcification of the aortic valve. Aortic valve mean gradient measures  12.0 mmHg. Aortic valve peak gradient  measures 21.3 mmHg. Aortic valve area, by VTI  measures 1.78 cm.   Pulmonic Valve: The pulmonic valve was normal in structure. Pulmonic valve  regurgitation is mild. No evidence of pulmonic stenosis.   Aorta: Aortic dilatation noted. There is borderline dilatation of the  aortic root measuring 39 mm.   Venous: The inferior vena cava is normal in size with greater than 50%  respiratory variability, suggesting right atrial pressure of 3 mmHg.   IAS/Shunts: No atrial level shunt detected by color flow Doppler.   Assessment/Plan:  Active Problems:   Weakness   Nausea  Allen Gould is a 54-yr-old man with PMH of OSA, PAF, HTN, and T2DM who presented to the hospital after a fall. He was found to have an abnormal EKG and HAGMA. Pt is able to ambulate minimally, and X-rays are negative for fracture.  Fall with right knee and wrist pain:  Pt able to walk minimally, but ambulation difficult. X-rays negative for fracture. PT recommending SNF level of care as pt's home situation is difficult. -Ibuprofen 600 mg q6h PRN  HAGMA 2/2 to Starvation Ketosis Patient, with history of diabetes and not on an SGLT-2 inhibitor, presented to the emergency department with generalized weakness and nausea.He had a fall the previous Friday and called EMS, who helped him into his recliner. Since Saturday, the patient had not moved from his recliner, and ate a handful of nuts and "red hot" sausages that were close to his recliner. He was found to have a high anion gap, increased beta-hydroxybutyric acid, hypochloremia, and hyponatremia  during his ED course and was consulted for admission. His high anion gap was likely caused by starvation ketosis. After eating his anion gap resolved, patient endorsed feeling more energized, and his nausea abated.   Starvation ketoacidosis is the most likely cause of HAGMA given pt's decreased appetite and poor oral intake. Labs have improved overnight.  Anion gap has resolved. Poor appetite and history of diabetes concerning for  gastroparesis. -Trend BMP -Treat empirically with metoclopramide -Outpatient follow up for gastric emptying study  1st Degree AV Block:  Patient reports that generalized weakness is chronic and began after Afib diagnosis. On EKG, patient had premature ventricular contractions and premature atrial contractions with PR interval prologation suspicious for first degree AV block. Echocardiogram indicated to assess for cardiac structural abnormalities. It was recommended that he avoid nodal blocking agents.   - Avoid nodal blocking agents  - Follow up echo results   Major Depressive Disorder:  On admission, patient reported decreased energy, insomnia, anhedonia, feelings of worthlessness, 20 pound weight loss, and depressed mood. These  feelings began in January of 2021 after the death of his fiance, and socioeconomic hardships. Patient states that he was placed on Zoloft during a recent hospitalization which made his see and hear things that were not present in the room, and was discontinued. He was started on Prozac during his hospitalization.   On admission, pt reported insomnia, anhedonia, and 20 lb weight loss over the past month. Patient full code. -Fluoxetine 20 mg QD  HTN: -Amlodipine 10 mg QD -Lisinopril 20 mg QD  T2DM: On admission, patient with a history of diabetes, on insulin, presented with fatigue, generalized weakness, and has been holding off his home insulin dose. Patient endorses early satiety and having feelings that food sits in his stomach for prolonged periods of time. Due to his symptoms of early satiety and weight loss, it is concerning for possible malignancy versus gastroparesis. It is recommended that he follow up in the outpatient setting for possible EGD and gastric emptying study.   -SSI  FEN/GI:  -Carb modified diet  DVT prophylaxis:  -IV Lovenox 70 mg SQ  CODE STATUS: FULL  Prior to Admission Living Arrangement: Home Anticipated Discharge Location:  SNF Barriers to Discharge: Poor living situation Dispo: Anticipated discharge in greater than 2 midnights  Maudie Mercury, MD 10/12/2019, 7:52 AM Pager: [phone]

## 2019-10-12 NOTE — Progress Notes (Signed)
Report called to [facility] at [place]

## 2019-10-15 LAB — CULTURE, BLOOD (ROUTINE X 2)
Culture: NO GROWTH
Culture: NO GROWTH

## 2019-11-16 ENCOUNTER — Encounter: Payer: Self-pay | Admitting: Physician Assistant

## 2019-12-19 ENCOUNTER — Ambulatory Visit: Payer: Medicaid Other | Admitting: Physician Assistant

## 2019-12-26 ENCOUNTER — Other Ambulatory Visit: Payer: Self-pay

## 2019-12-26 ENCOUNTER — Ambulatory Visit: Payer: Medicaid Other | Admitting: Physician Assistant

## 2019-12-26 ENCOUNTER — Encounter: Payer: Self-pay | Admitting: Physician Assistant

## 2019-12-26 VITALS — BP 142/102 | HR 82 | Ht 70.0 in | Wt 312.0 lb

## 2019-12-26 DIAGNOSIS — I480 Paroxysmal atrial fibrillation: Secondary | ICD-10-CM | POA: Diagnosis not present

## 2019-12-26 DIAGNOSIS — I441 Atrioventricular block, second degree: Secondary | ICD-10-CM | POA: Diagnosis not present

## 2019-12-26 DIAGNOSIS — I452 Bifascicular block: Secondary | ICD-10-CM | POA: Diagnosis not present

## 2019-12-26 DIAGNOSIS — I2510 Atherosclerotic heart disease of native coronary artery without angina pectoris: Secondary | ICD-10-CM

## 2019-12-26 DIAGNOSIS — E119 Type 2 diabetes mellitus without complications: Secondary | ICD-10-CM

## 2019-12-26 DIAGNOSIS — I10 Essential (primary) hypertension: Secondary | ICD-10-CM

## 2019-12-26 DIAGNOSIS — F1010 Alcohol abuse, uncomplicated: Secondary | ICD-10-CM

## 2019-12-26 DIAGNOSIS — Z794 Long term (current) use of insulin: Secondary | ICD-10-CM

## 2019-12-26 MED ORDER — LOSARTAN POTASSIUM 50 MG PO TABS
50.0000 mg | ORAL_TABLET | Freq: Every day | ORAL | 1 refills | Status: DC
Start: 2019-12-26 — End: 2020-04-24

## 2019-12-26 NOTE — Progress Notes (Signed)
Cardiology Office Note:    Date:  12/26/2019   ID:  Allen Gould, DOB [birth year], MRN [id number]  PCP:  No primary care provider on file.  Cardiologist:  Fransico Him, MD   Electrophysiologist:  None   Referring MD: [name], MD   Chief Complaint:  Hospitalization Follow-up (admx in 4/21 for UTI, 5/21 for ketoacidosis >> noted 2nd degree AVB Type I and 1st degree AVB (seen by Card))    Patient Profile:    Allen Gould is a 54 y.o. male with:   Coronary artery disease  Mild to mod non-obs Dz by cath at [facility] in 2018   Paroxysmal atrial fibrillation  CHA2DS2-VASc=3 (HTN, Diab, CAD)   admx in 11/2018 at [facility] w scrotal abscess >> AF w RVR  Prior anticoagulation with Apixaban, Rivaroxaban >> not continued after 4/21 admit due to hx of ETOH abuse, non-adherence   RBBB, LAFB  Mobitz I  1st degree AVB  Alcohol abuse  Morbid obesity  Hypertension   Diabetes mellitus   Macrocytic anemia   Prior CV studies: Echocardiogram 10/11/2019 EF 60-65, no RWMA, moderate LVH, normal RVSF, mild LAE, severe calcification of anterior mitral valve leaflet, trivial MR, mild MS, mild AS (mean gradient 12 mmHg), borderline dilatation of aortic root (39 mm)  Echocardiogram 11/30/2018 ([facility]) EF 60-65, BAE, mod LVH  Echocardiogram 07/01/2018 ([facility]) EF 60-65  Echocardiogram 05/30/2017 Moderate concentric LVH, EF 55-60, normal wall motion, GR 1 DD, aortic root 39 mm, mild MR  Cardiac catheterization 10/27/16 ([facility]) Report states:  "mild to mod non-obstructive dz"  Echocardiogram 10/25/16 ([facility]) EF 55-60, mod LVH, Gr 1 DD, mild LAE   History of Present Illness:    Allen Gould was initially evaluated by Dr. Radford Pax in 4/21 during an admission for a UTI.  He was noted to have 2nd degree AVB Type I.  There was no indication for pacemaker.  He had not been adherent with anticoagulation for hx of PAF.  It was decided to not resume anticoagulation due to hx of ETOH abuse and non-adherence which can  result in increased bleeding risk.  He was then admitted in May with high anion gap metabolic acidosis secondary to starvation ketosis.  He had fallen and was helped to his recliner by EMS but had not been able to move from his recliner for several days.  He had very little to eat and once he ate, his anion gap resolved.  1st degree AVB was noted.  He was noted seen by Cardiology.  An echocardiogram demonstrated normal EF, mild MS, mild AS and mod LVH.  He returns for follow up.   He currently resides in [facility] assisted [facility] facility.   He has been working with physical therapy.  He feels that he is doing well.  He has not had chest discomfort, significant shortness of breath, orthopnea, leg swelling.  He has not had syncope, near syncope or exercise intolerance.   Past Medical History:  Diagnosis Date   Acute kidney injury ([place]) 09/01/2019   Asthma    Balanitis 08/09/2014   Bifascicular block    Cough 08/09/2014   Diabetes mellitus without complication ([place])    DM (diabetes mellitus) type 2, uncontrolled, without ketoacidosis 08/10/2013   Dyslipidemia 03/01/2014   Financial difficulties 08/09/2014   GERD (gastroesophageal reflux disease)    Health care maintenance 03/01/2014   Hypertension    Hypertensive urgency 05/29/2017   Hypomagnesemia 05/29/2017   Insomnia 08/29/2013   Morbid obesity ([place]) 08/29/2013  Near syncope 05/29/2017   Odontogenic infection of jaw 05/29/2017   PAC (premature atrial contraction)    PAF (paroxysmal atrial fibrillation) ([facility])    Pressure injury of skin 09/02/2019   PVCs (premature ventricular contractions)    Right shoulder pain 08/29/2013   Second degree AV block, Mobitz type I    Sepsis ([place]) 05/29/2017   Sleep apnea    uses cpap    Current Medications: Current Meds  Medication Sig   amLODipine (NORVASC) 10 MG tablet Take 10 mg by mouth daily.   aspirin 81 MG EC tablet Take 81 mg by mouth daily. Swallow whole.   atorvastatin  (LIPITOR) 40 MG tablet Take 1 tablet (40 mg total) by mouth daily at 6 PM.   famotidine (PEPCID) 10 MG tablet Take 1 tablet (10 mg total) by mouth 2 (two) times daily.   FLUoxetine (PROZAC) 20 MG capsule Take 1 capsule (20 mg total) by mouth daily.   folic acid (FOLVITE) 1 MG tablet Take 1 tablet (1 mg total) by mouth daily.   gabapentin (NEURONTIN) 300 MG capsule Take 1 capsule by mouth in the morning, at noon, and at bedtime.   insulin aspart protamine- aspart (NOVOLOG MIX 70/30) (70-30) 100 UNIT/ML injection Inject 4 Units into the skin.   metFORMIN (GLUCOPHAGE) 500 MG tablet Take 500 mg by mouth daily with breakfast.   promethazine (PHENERGAN) 12.5 MG tablet Take 1 tablet (12.5 mg total) by mouth every 6 (six) hours as needed for nausea.   vitamin B-12 (CYANOCOBALAMIN) 1000 MCG tablet Take 1,000 mcg by mouth daily.     Allergies:   Shellfish allergy   Social History   Tobacco Use   Smoking status: Former Smoker    Types: Cigarettes   Smokeless tobacco: Never Used   Tobacco comment: 1 cigerette a dayb 2-3 cigerettes per week  Vaping Use   Vaping Use: Never used  Substance Use Topics   Alcohol use: Yes    Alcohol/week: 2.0 standard drinks    Types: 2 Cans of beer per week    Comment: every other day   Drug use: No     Family Hx: The patient's family history includes Diabetes in his sister; Heart disease in an other family member; Hyperlipidemia in his sister; Kidney disease in his mother.  ROS   EKGs/Labs/Other Test Reviewed:    EKG:  EKG is  ordered today.  The ekg ordered today demonstrates sinus rhythm, HR 82, left anterior fascicular block, right bundle branch block, first-degree AV block (PR 392), QTC 468,?  Nonconducted PAC  Recent Labs: 10/10/2019: TSH 2.150 10/11/2019: Hemoglobin 8.9; Magnesium 1.7; Platelets 274 10/12/2019: ALT 8; BUN 11; Creatinine, Ser 0.99; Potassium 3.6; Sodium 130   Recent Lipid Panel Lab Results  Component Value Date/Time    CHOL 246 (H) 02/28/2014 03:08 PM   TRIG 588 (H) 02/28/2014 03:08 PM   HDL 41 02/28/2014 03:08 PM   CHOLHDL 6.0 02/28/2014 03:08 PM   LDLCALC NOT CALC 02/28/2014 03:08 PM   LDLDIRECT 108 (H) 03/01/2014 04:09 AM    Physical Exam:    VS:  BP (!) 142/102    Pulse 82    Ht 5' 10"  (1.778 m)    Wt (!) 312 lb (141.5 kg)    SpO2 98%    BMI 44.77 kg/m     Wt Readings from Last 3 Encounters:  12/26/19 (!) 312 lb (141.5 kg)  10/10/19 (!) 309 lb 1.4 oz (140.2 kg)  09/26/19 (!) 309 lb (140.2  kg)     Constitutional:      Appearance: Healthy appearance. Not in distress.  Pulmonary:     Effort: Pulmonary effort is normal.     Breath sounds: No wheezing. No rales.  Cardiovascular:     Normal rate. Regular rhythm. Normal S1. Normal S2.     Murmurs: There is no murmur.  Edema:    Peripheral edema absent.  Abdominal:     Palpations: Abdomen is soft.  Musculoskeletal:     Cervical back: Neck supple. Skin:    General: Skin is warm and dry.  Neurological:     General: No focal deficit present.     Mental Status: Alert and oriented to person, place and time.     Cranial Nerves: Cranial nerves are intact.       ASSESSMENT & PLAN:    1. Bifascicular block 2. Second degree AV block, Mobitz type I He has evidence of conduction system disease.  I reviewed this with him today.  He does not have evidence of symptomatic bradycardia.  There is no indication for pacemaker at this point.  We discussed symptoms that would potentially indicate worsening rhythm.  Avoid AV nodal blocking agents.  3. Coronary artery disease involving native coronary artery of native heart without angina pectoris History of nonobstructive disease by cardiac catheterization in 2018.  He is not having anginal symptoms.  Continue aspirin, statin.  4. PAF (paroxysmal atrial fibrillation) ([place]) He has a history of atrial fibrillation in the setting of an admission for infection.  He was previously on Apixaban.  He was  nonadherent to this and it was not continued given his history of alcohol abuse.  He is currently living in [facility] assisted [facility] facility.  He is not sure when he will be discharged.  I explained him the importance of anticoagulation.  However, at this point, I do not think we should resume anticoagulation yet.  Once he returns home, we can reassess how he is doing and whether or not he has resumed alcohol abuse.  If he is doing well, we can certainly discuss resuming anticoagulation at that time.  5. Essential hypertension Blood pressure uncontrolled.  Continue current dose of amlodipine.  Increase losartan to 50 mg daily.  Obtain BMET in 2 weeks.  Consider adding hydralazine or HCTZ if blood pressure remains uncontrolled.  6. Type 2 diabetes mellitus without complication, with long-term current use of insulin ([place]) Continue follow-up with primary care.  Consider empagliflozin given CV benefit.  7. Alcohol abuse He is currently not drinking as he is in assisted living facility.  He notes that he had significant difficulty after his fiance passed away.  She died from [id number] complications.  He has been unable to work for the past 2 years and has a difficult living situation.  I have encouraged him to reach out to [place] after discharge from assisted living if he needs further assistance.  We can certainly refer him to our social work staff to see if there is any assistance that can be provided.    Dispo:  Return in about 3 months (around 03/27/2020) for Routine Follow Up w/ Dr. Radford Pax, or Richardson Dopp, PA-C, in person.   Medication Adjustments/Labs and Tests Ordered: Current medicines are reviewed at length with the patient today.  Concerns regarding medicines are outlined above.  Tests Ordered: Orders Placed This Encounter  Procedures   EKG 12-Lead   Medication Changes: Meds ordered this encounter  Medications   losartan (COZAAR) 50  MG tablet    Sig: Take 1 tablet (50 mg total) by  mouth daily.    Dispense:  90 tablet    Refill:  1    Signed, Richardson Dopp, PA-C  12/26/2019 3:44 PM    [facility] [place], [place],   [address] Phone: [phone]; Fax: [phone]

## 2019-12-26 NOTE — Patient Instructions (Signed)
Medication Instructions:  Your physician has recommended you make the following change in your medication:   1) Increase Losartan to 50 mg, 1 tablet by mouth once a day  *If you need a refill on your cardiac medications before your next appointment, please call your pharmacy*  Lab Work: BMET to be done at facility  Testing/Procedures: None ordered today  Follow-Up: On 04/02/20 at 2:20PM with Fransico Him, MD

## 2020-02-14 ENCOUNTER — Ambulatory Visit: Payer: Medicaid Other | Admitting: Internal Medicine

## 2020-02-14 ENCOUNTER — Encounter: Payer: Self-pay | Admitting: Internal Medicine

## 2020-02-14 ENCOUNTER — Other Ambulatory Visit: Payer: Self-pay

## 2020-02-14 ENCOUNTER — Telehealth: Payer: Self-pay

## 2020-02-14 VITALS — BP 122/95 | HR 99 | Temp 97.9°F | Ht 70.0 in | Wt 303.7 lb

## 2020-02-14 DIAGNOSIS — I10 Essential (primary) hypertension: Secondary | ICD-10-CM | POA: Diagnosis not present

## 2020-02-14 DIAGNOSIS — F329 Major depressive disorder, single episode, unspecified: Secondary | ICD-10-CM | POA: Diagnosis not present

## 2020-02-14 DIAGNOSIS — E119 Type 2 diabetes mellitus without complications: Secondary | ICD-10-CM | POA: Diagnosis present

## 2020-02-14 DIAGNOSIS — R5383 Other fatigue: Secondary | ICD-10-CM | POA: Diagnosis not present

## 2020-02-14 DIAGNOSIS — E669 Obesity, unspecified: Secondary | ICD-10-CM

## 2020-02-14 DIAGNOSIS — Z599 Problem related to housing and economic circumstances, unspecified: Secondary | ICD-10-CM

## 2020-02-14 DIAGNOSIS — E1110 Type 2 diabetes mellitus with ketoacidosis without coma: Secondary | ICD-10-CM

## 2020-02-14 DIAGNOSIS — R531 Weakness: Secondary | ICD-10-CM

## 2020-02-14 NOTE — Telephone Encounter (Signed)
  Chronic Care Management   Outreach Note  02/14/2020 Name: Allen Gould MRN: [id number] DOB: 08-May-1966   Thurston Hole Admissions Coordinator, [place], to ensure that patient can be readmitted to facility on 02/15/20.  [name] did confirm this and requested that provider complete/[phone]) updated FL 2.  Filled out portion of form and forwarded to Dr. Darrick Meigs for completion and signature.   Asked [name] to fax medication list for patient.  Met with patient to ensure that he has transportation to [employer].  Patient states that his niece has agreed to take him.  [name], [place] Coordination Social Worker [place] [phone]

## 2020-02-15 NOTE — Progress Notes (Signed)
Office Visit   Patient ID: Allen Gould, male    DOB: May 08, 1966, 54 y.o.   MRN: [id number]  Subjective:  CC: hospital follow up for starvation ketosis, major depressive disorder, type 2 diabetes  HPI 54 y.o. presents today for the above.  Hospital discharge note reviewed. Admitted to our inpatient service 5/19-5/21. He had presented to the ED with generalized weakness and nausea. He had fallen a few days prior and had been sitting in his recliner since then, eating a handful of nuts and "red hot" sausages he had next to the chair.  --Upon arrival to the ED, he was found to have HAGMA, elevated BHB and it was determined that this was the result of starvation ketosis. --He had also had symptoms of major depression on admission which were believed to be situational due to the recent loss of his fiance due to [place] and socioeconomic hardships. On a prior admission, he had been placed on zoloft which made him have hallucinations, and was therefore discontinued. On this most recent admission, he was started on prozac and did not have symptoms leading up to the time of discharge. --Pt also noted symptoms concerning for gastroparesis during this admission and it was recommended that he follow up with an EGD and gastric emptying study. --He was discharged to [facility] for rehabilitation.  Upon arrival to the clinic today, patient notes that he was discharged from [facility] last week to live with his sister. He relays that, since arriving at his sister's house, living conditions have been terrible. He additionally notes that he is left alone for hours at a time to care for some children. He tells me he hasn't ate or drank anything for 3 or 4 days. Given the living conditions, he is planning to return to [name]. He initially told me he was unsure how he was going to get there but upon speaking with SW, he noted that his niece is going to take him.      ACTIVE MEDICATIONS   Current  Outpatient Medications on File Prior to Visit  Medication Sig Dispense Refill  . amLODipine (NORVASC) 10 MG tablet Take 10 mg by mouth daily.    [name] aspirin 81 MG EC tablet Take 81 mg by mouth daily. Swallow whole.    [name] atorvastatin (LIPITOR) 40 MG tablet Take 1 tablet (40 mg total) by mouth daily at 6 PM. 30 tablet 2  . famotidine (PEPCID) 10 MG tablet Take 1 tablet (10 mg total) by mouth 2 (two) times daily. 60 tablet 0  . FLUoxetine (PROZAC) 20 MG capsule Take 1 capsule (20 mg total) by mouth daily. 30 capsule 0  . folic acid (FOLVITE) 1 MG tablet Take 1 tablet (1 mg total) by mouth daily. 360 tablet 0  . gabapentin (NEURONTIN) 300 MG capsule Take 1 capsule by mouth in the morning, at noon, and at bedtime.    . insulin aspart protamine- aspart (NOVOLOG MIX 70/30) (70-30) 100 UNIT/ML injection Inject 4 Units into the skin.    [name] losartan (COZAAR) 50 MG tablet Take 1 tablet (50 mg total) by mouth daily. 90 tablet 1  . metFORMIN (GLUCOPHAGE) 500 MG tablet Take 500 mg by mouth daily with breakfast.    . promethazine (PHENERGAN) 12.5 MG tablet Take 1 tablet (12.5 mg total) by mouth every 6 (six) hours as needed for nausea. 30 tablet 0  . vitamin B-12 (CYANOCOBALAMIN) 1000 MCG tablet Take 1,000 mcg by mouth daily.     Current  Facility-Administered Medications on File Prior to Visit  Medication Dose Route Frequency Provider Last Rate Last Admin  . lisinopril (PRINIVIL,ZESTRIL) tablet 20 mg  20 mg Oral Once Francesca Oman, DO        ROS  Review of Systems  Constitutional: Positive for appetite change and fatigue. Negative for chills and fever.  Respiratory: Negative.   Cardiovascular: Negative.   Psychiatric/Behavioral: Positive for agitation, decreased concentration and sleep disturbance. Negative for self-injury and suicidal ideas. The patient is nervous/anxious.     Objective:   BP (!) 122/95 (Cuff Size: Large)   Pulse 99   Temp 97.9 F (36.6 C) (Oral)   Ht 5' 10"  (1.778 m)   Wt (!) 303  lb 11.2 oz (137.8 kg)   SpO2 99% Comment: room air  BMI 43.58 kg/m  Wt Readings from Last 3 Encounters:  02/14/20 (!) 303 lb 11.2 oz (137.8 kg)  12/26/19 (!) 312 lb (141.5 kg)  10/10/19 (!) 309 lb 1.4 oz (140.2 kg)   BP Readings from Last 3 Encounters:  02/14/20 (!) 122/95  12/26/19 (!) 142/102  10/12/19 117/66   Physical Exam Vitals and nursing note reviewed.  Constitutional:      Appearance: He is obese.     Comments: Chronically ill appearing  HENT:     Mouth/Throat:     Mouth: Mucous membranes are moist.  Cardiovascular:     Rate and Rhythm: Normal rate.  Pulmonary:     Effort: Pulmonary effort is normal.  Skin:    General: Skin is warm and dry.     Findings: No rash.  Neurological:     General: No focal deficit present.     Mental Status: He is oriented to person, place, and time.  Psychiatric:     Comments: Depressed affect. No suicidal intentions noted.     Health Maintenance:   Health Maintenance  Topic Date Due  . COVID-19 Vaccine (1) Never done  . TETANUS/TDAP  Never done  . COLONOSCOPY  Never done  . FOOT EXAM  08/30/2014  . OPHTHALMOLOGY EXAM  03/01/2015  . LIPID PANEL  03/02/2015  . HEMOGLOBIN A1C  12/02/2019  . INFLUENZA VACCINE  12/23/2019  . PNEUMOCOCCAL POLYSACCHARIDE VACCINE AGE 78-64 HIGH RISK  Completed  . Hepatitis C Screening  Completed  . HIV Screening  Completed     Assessment & Plan:   Pt presented to the clinic today for hospital follow up. This appointment was arranged prior to him deciding to return to [facility] for ongoing rehabilitation due to poor living conditions at home. He notes that he has been out of all of his medications for at least 5 days due to his sister not picking them up.  Pt reports no oral intake in the past 4-5d. I question this as he does not exhibit signs of dehydration in the clinic. He is actually hypertensive and mucous membranes appear moist. I am doubtful that these things would be present if he had not  had at least some degree of fluid intake.  Chronic hypertension. Blood pressure is above goal at today's visit which is likely 2/2 him being out of medication for the past 5d.  Initial blood pressure today was 155/112.  Plan: prescriptions should already be at the pharmacy and I will not make further interventions today since he will be returning to [facility] tomorrow.  T2DM. Pt denies any s/s of hypo/hyperglycemia today.  Plan: pt will need to get back on his medications once back at  [facility] tomorrow.  Depression. Pt has been out of prozac for 5d. I am somewhat worried about him being on this based on his prior hallucinations with zoloft  Plan: he may continue prozac at [facility] but I recommend close monitoring for adverse effects   Pt discussed with Dr. Adolm Joseph, MD Internal Medicine Resident PGY-2 [facility] Pager: [phone] 02/15/2020 1:56 PM

## 2020-02-18 NOTE — Progress Notes (Signed)
Internal Medicine Clinic Attending  Case discussed with Dr. [name]  At the time of the visit.  We reviewed the resident's history and exam and pertinent patient test results.  I agree with the assessment, diagnosis, and plan of care documented in the resident's note.  

## 2020-02-28 ENCOUNTER — Telehealth: Payer: Self-pay

## 2020-02-28 NOTE — Telephone Encounter (Signed)
Received a TC from a nurse at [facility], [employer].  She is requesting medication orders for this patient, states he is back at home.  Per chart review, [facility] is not listed as PCP.  Patient has only been seen at [facility] once on 02/14/20 for a HFU.  There is also a telephone note from 02/14/20 from [employer], [place] that patient is being readmitted to [facility].   No HH orders are noted in chart after patient was discharged from [facility]. Will forward to [employer], BSW to assist. Thank you, SChaplin, RN,BSN

## 2020-02-28 NOTE — Telephone Encounter (Signed)
  Chronic Care Management   Outreach Note  02/28/2020 Name: Allen Gould MRN: [id number] DOB: 1966-02-14    CCM BSW was requested to consult with patient on 02/14/20 regarding transition back to [facility].  Spoke to [name] that day who confirmed that patient could be readmitted due to discharge of less than 30 days.  Readmission was planned for 02/15/20.  Requested that [name] SW fax medication list to [name].    [name], [place] Coordination Social Worker [name] [phone]

## 2020-03-12 ENCOUNTER — Other Ambulatory Visit: Payer: Self-pay | Admitting: *Deleted

## 2020-03-12 DIAGNOSIS — E785 Hyperlipidemia, unspecified: Secondary | ICD-10-CM

## 2020-03-12 NOTE — Telephone Encounter (Signed)
Benjamine Mola, RN Publishing copy at [facility], called stating patient has no meds in his home other than ASA and vitamins. Today's BP was 190/110 and 180/90 at recheck. Requesting all meds be refilled at [facility] on [address]. Patient has refill for losartan already at [facility].  Also, per med rec with [facility], dosage for metformin is 750 mg. Med list has 500 mg. L. Marshell Rieger, BSN, RN-BC

## 2020-03-13 MED ORDER — GABAPENTIN 300 MG PO CAPS: 300.0000 mg | ORAL_CAPSULE | Freq: Three times a day (TID) | ORAL | 1 refills | Status: DC

## 2020-03-13 MED ORDER — AMLODIPINE BESYLATE 10 MG PO TABS: 10.0000 mg | ORAL_TABLET | Freq: Every day | ORAL | 3 refills | Status: DC

## 2020-03-13 MED ORDER — FLUOXETINE HCL 20 MG PO CAPS: 20.0000 mg | ORAL_CAPSULE | Freq: Every day | ORAL | 0 refills | Status: DC

## 2020-03-13 MED ORDER — ATORVASTATIN CALCIUM 40 MG PO TABS: 40.0000 mg | ORAL_TABLET | Freq: Every day | ORAL | 2 refills | Status: DC

## 2020-03-13 NOTE — Telephone Encounter (Signed)
Left detailed message on Allen Gould's self-identified VM that meds have been refilled. [name], BSN, RN-BC

## 2020-03-14 ENCOUNTER — Other Ambulatory Visit: Payer: Self-pay

## 2020-03-14 MED ORDER — METFORMIN HCL 500 MG PO TABS: 750.0000 mg | ORAL_TABLET | Freq: Every day | ORAL | 3 refills | Status: DC

## 2020-03-14 MED ORDER — METFORMIN HCL 500 MG PO TABS: 500.0000 mg | ORAL_TABLET | Freq: Every day | ORAL | 3 refills | Status: DC

## 2020-03-14 NOTE — Addendum Note (Signed)
Addended by: [name] on: 03/14/2020 09:31 AM   Modules accepted: Orders

## 2020-03-14 NOTE — Telephone Encounter (Signed)
Need refill on all medicine  ; pt contact [facility] ([facility]), [place] [facility] [place]

## 2020-03-19 ENCOUNTER — Telehealth: Payer: Self-pay | Admitting: Internal Medicine

## 2020-03-19 NOTE — Telephone Encounter (Signed)
LPN from [facility] requesting a call back  About the patient's medications  norvasc 10 mg  vitamin d 50,0000 units   atorvastatin (LIPITOR) 40 MG tablet  vitamin B-12 (CYANOCOBALAMIN) 1000 MCG tablet  losartan (COZAAR) 50 MG tablet  Pt also requesting to change pharmacy   [facility] Address: [address] [place], [place] [address] Phone: [phone]

## 2020-03-19 NOTE — Telephone Encounter (Signed)
Return call to [facility] - no answer; left message to call the office.

## 2020-03-19 NOTE — Telephone Encounter (Signed)
LPN from [facility] requesting a call back  About the patient's medications  norvasc 10 mg  vitamin d 50,0000 units   atorvastatin (LIPITOR) 40 MG tablet  vitamin B-12 (CYANOCOBALAMIN) 1000 MCG tablet  losartan (COZAAR) 50 MG tablet  Pt also requesting to change pharmacy   [facility] Address: [address], [place], [place] [address] Phone: [phone]

## 2020-04-02 ENCOUNTER — Ambulatory Visit: Payer: Medicaid Other | Admitting: Cardiology

## 2020-04-02 ENCOUNTER — Encounter: Payer: Self-pay | Admitting: Cardiology

## 2020-04-02 ENCOUNTER — Telehealth: Payer: Self-pay | Admitting: Radiology

## 2020-04-02 ENCOUNTER — Other Ambulatory Visit: Payer: Self-pay

## 2020-04-02 VITALS — BP 128/79 | HR 89 | Ht 70.0 in | Wt 314.6 lb

## 2020-04-02 DIAGNOSIS — I480 Paroxysmal atrial fibrillation: Secondary | ICD-10-CM | POA: Diagnosis not present

## 2020-04-02 DIAGNOSIS — I441 Atrioventricular block, second degree: Secondary | ICD-10-CM | POA: Diagnosis not present

## 2020-04-02 DIAGNOSIS — F1010 Alcohol abuse, uncomplicated: Secondary | ICD-10-CM

## 2020-04-02 DIAGNOSIS — I10 Essential (primary) hypertension: Secondary | ICD-10-CM

## 2020-04-02 DIAGNOSIS — I2510 Atherosclerotic heart disease of native coronary artery without angina pectoris: Secondary | ICD-10-CM

## 2020-04-02 DIAGNOSIS — E785 Hyperlipidemia, unspecified: Secondary | ICD-10-CM

## 2020-04-02 DIAGNOSIS — E7800 Pure hypercholesterolemia, unspecified: Secondary | ICD-10-CM

## 2020-04-02 MED ORDER — ATORVASTATIN CALCIUM 80 MG PO TABS
80.0000 mg | ORAL_TABLET | Freq: Every day | ORAL | 3 refills | Status: DC
Start: 2020-04-02 — End: 2020-04-02

## 2020-04-02 MED ORDER — APIXABAN 5 MG PO TABS: 5.0000 mg | ORAL_TABLET | Freq: Two times a day (BID) | ORAL | 3 refills | Status: DC

## 2020-04-02 MED ORDER — ATORVASTATIN CALCIUM 40 MG PO TABS: 40.0000 mg | ORAL_TABLET | Freq: Every day | ORAL | 2 refills | Status: DC

## 2020-04-02 NOTE — Addendum Note (Signed)
Addended by: Antonieta Iba on: 04/02/2020 03:42 PM   Modules accepted: Orders

## 2020-04-02 NOTE — Addendum Note (Signed)
Addended by: Antonieta Iba on: 04/02/2020 03:37 PM   Modules accepted: Orders

## 2020-04-02 NOTE — Telephone Encounter (Signed)
Enrolled patient for a 14 day Zio XT  monitor to be mailed to patients home  °

## 2020-04-02 NOTE — Patient Instructions (Addendum)
Medication Instructions:  Your physician has recommended you make the following change in your medication:  1) START taking Eliquis (apixaban) 5 mg twice daily   *If you need a refill on your cardiac medications before your next appointment, please call your pharmacy*   Lab Work: Fasting lipids, ALT, and CMET  If you have labs (blood work) drawn today and your tests are completely normal, you will receive your results only by: [name] MyChart Message (if you have MyChart) OR . A paper copy in the mail If you have any lab test that is abnormal or we need to change your treatment, we will call you to review the results.  Follow-Up: At [facility], you and your health needs are our priority.  As part of our continuing mission to provide you with exceptional heart care, we have created designated Provider Care Teams.  These Care Teams include your primary Cardiologist (physician) and Advanced Practice Providers (APPs -  Physician Assistants and Nurse Practitioners) who all work together to provide you with the care you need, when you need it.  Your next appointment:   6 month(s)  The format for your next appointment:   In Person  Provider:   You may see Fransico Him, MD or one of the following Advanced Practice Providers on your designated Care Team:    Melina Copa, PA-C  Ermalinda Barrios, PA-C  Other Instructions: Bryn Gull[facility]- Long Term Monitor Instructions   Your physician has requested you wear your ZIO patch monitor__14__days.   This is a single patch monitor.  [employer] supplies one patch monitor per enrollment.  Additional stickers are not available.   Please do not apply patch if you will be having a Nuclear Stress Test, Echocardiogram, Cardiac CT, MRI, or Chest Xray during the time frame you would be wearing the monitor. The patch cannot be worn during these tests.  You cannot remove and re-apply the ZIO XT patch monitor.   Your ZIO patch monitor will be sent USPS Priority mail from  [facility] directly to your home address. The monitor may also be mailed to a PO BOX if home delivery is not available.   It may take 3-5 days to receive your monitor after you have been enrolled.   Once you have received you monitor, please review enclosed instructions.  Your monitor has already been registered assigning a specific monitor serial # to you.   Applying the monitor   Shave hair from upper left chest.   Hold abrader disc by orange tab.  Rub abrader in 40 strokes over left upper chest as indicated in your monitor instructions.   Clean area with 4 enclosed alcohol pads .  Use all pads to assure are is cleaned thoroughly.  Let dry.   Apply patch as indicated in monitor instructions.  Patch will be place under collarbone on left side of chest with arrow pointing upward.   Rub patch adhesive wings for 2 minutes.Remove white label marked "1".  Remove white label marked "2".  Rub patch adhesive wings for 2 additional minutes.   While looking in a mirror, press and release button in center of patch.  A small green light will flash 3-4 times .  This will be your only indicator the monitor has been turned on.     Do not shower for the first 24 hours.  You may shower after the first 24 hours.   Press button if you feel a symptom. You will hear a small click.  Record Date, Time and Symptom in the Patient Log Book.   When you are ready to remove patch, follow instructions on last 2 pages of Patient Log Book.  Stick patch monitor onto last page of Patient Log Book.   Place Patient Log Book in [place] box.  Use locking tab on box and tape box closed securely.  The [employer] and [employer] has [employer] on it.  Please place in mailbox as soon as possible.  Your physician should have your test results approximately 7 days after the monitor has been mailed back to [facility].   Call [facility] at [phone] if you have questions regarding your ZIO XT patch  monitor.  Call them immediately if you see an orange light blinking on your monitor.   If your monitor falls off in less than 4 days contact our Monitor department at [phone].  If your monitor becomes loose or falls off after 4 days call [employer] at [phone] for suggestions on securing your monitor.

## 2020-04-02 NOTE — Progress Notes (Signed)
Cardiology Office Note:    Date:  04/02/2020   ID:  [name], DOB 21-Aug-1965, MRN [id number]  PCP:  Patient, No Pcp Per  Cardiologist:  Fransico Him, MD   Electrophysiologist:  None   Referring MD: No ref. provider found   Chief Complaint:  Follow-up (Mobitz type 1 AV block, PAF, HTN)    Patient Profile:    Allen Gould is a 54 y.o. 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  Alcohol abuse  Morbid obesity  Hypertension   Diabetes mellitus   Macrocytic anemia   Prior CV studies: Echocardiogram 10/11/2019 EF 60-65, no RWMA, moderate LVH, normal RVSF, mild LAE, severe calcification of anterior mitral valve leaflet, trivial MR, mild MS, mild AS (mean gradient 12 mmHg), borderline dilatation of aortic root (39 mm)  Echocardiogram 11/30/2018 ([facility]) EF 60-65, BAE, mod LVH  Echocardiogram 07/01/2018 ([facility]) EF 60-65  Echocardiogram 05/30/2017 Moderate concentric LVH, EF 55-60, normal wall motion, GR 1 DD, aortic root 39 mm, mild MR  Cardiac catheterization 10/27/16 ([facility]) Report states:  "mild to mod non-obstructive dz"  Echocardiogram 10/25/16 ([facility]) EF 55-60, mod LVH, Gr 1 DD, mild LAE   History of Present Illness:    Allen Gould was initially evaluated by me in 4/21 during an admission for a UTI.  He was noted to have 2nd degree AVB Type I.  There was no indication for pacemaker.  He had not been adherent with anticoagulation for hx of PAF.  It was decided to not resume anticoagulation due to hx of ETOH abuse and non-adherence which can result in increased bleeding risk.  He was then admitted in May with high anion gap metabolic acidosis  secondary to starvation ketosis.  He had fallen and was helped to his recliner by EMS but had not been able to move from his recliner for several days.  He had very little to eat and once he ate, his anion gap resolved.  1st degree AVB was noted.  He was noted seen by Cardiology.  An echocardiogram demonstrated normal EF, mild MS, mild AS and mod LVH.  He was last seen in August by Richardson Dopp, PA and resided in [facility].  He was doing well at that time. His BP was poorly controlled at last OV and his Losartan was increased. Since then he has moved back home.    He is here today for followup and is doing well.  he denies any chest pain or pressure, SOB, DOE (except with extreme exertion), PND, orthopnea, LE edema, dizziness, palpitations or syncope. He does get sleepy during the evening and uses CPAP but does not use it much.  He says that the nasal mask bothers him when he opens his mouth.  He has not been compliant with his meds in the past but is tolerating meds with no SE.    Past Medical History:  Diagnosis Date  . Acute kidney injury ([place]) 09/01/2019  . Asthma   . Balanitis 08/09/2014  . Bifascicular block   . Cough 08/09/2014  . Diabetes mellitus without complication ([place])   . DM (  diabetes mellitus) type 2, uncontrolled, without ketoacidosis 08/10/2013  . Dyslipidemia 03/01/2014  . Financial difficulties 08/09/2014  . GERD (gastroesophageal reflux disease)   . Health care maintenance 03/01/2014  . Hypertension   . Hypertensive urgency 05/29/2017  . Hypomagnesemia 05/29/2017  . Insomnia 08/29/2013  . Morbid obesity ([place]) 08/29/2013  . Near syncope 05/29/2017  . Odontogenic infection of jaw 05/29/2017  . PAC (premature atrial contraction)   . PAF (paroxysmal atrial fibrillation) ([place])   . Pressure injury of skin 09/02/2019  . PVCs (premature ventricular contractions)   . Right shoulder pain 08/29/2013  . Second degree AV block, Mobitz type I   . Sepsis ([place]) 05/29/2017  .  Sleep apnea    uses cpap    Current Medications: Current Meds  Medication Sig  . amLODipine (NORVASC) 10 MG tablet Take 1 tablet (10 mg total) by mouth daily.  [name] aspirin 81 MG EC tablet Take 81 mg by mouth daily. Swallow whole.  . Ergocalciferol (VITAMIN D2 PO) Take 1 capsule by mouth as directed.  . famotidine (PEPCID) 10 MG tablet Take 1 tablet (10 mg total) by mouth 2 (two) times daily.  [name] FLUoxetine (PROZAC) 20 MG capsule Take 1 capsule (20 mg total) by mouth daily.  [name] gabapentin (NEURONTIN) 300 MG capsule Take 1 capsule (300 mg total) by mouth in the morning, at noon, and at bedtime.  [name] ibuprofen (ADVIL) 600 MG tablet Take 600 mg by mouth every 6 (six) hours as needed for mild pain or moderate pain.  [name] insulin aspart protamine- aspart (NOVOLOG MIX 70/30) (70-30) 100 UNIT/ML injection Inject 4 Units into the skin.  [name] losartan (COZAAR) 50 MG tablet Take 1 tablet (50 mg total) by mouth daily.  . metFORMIN (GLUCOPHAGE) 500 MG tablet Take 1.5 tablets (750 mg total) by mouth daily with breakfast.  . vitamin B-12 (CYANOCOBALAMIN) 1000 MCG tablet Take 1,000 mcg by mouth daily.  . [DISCONTINUED] atorvastatin (LIPITOR) 40 MG tablet Take 1 tablet (40 mg total) by mouth daily at 6 PM.     Allergies:   Shellfish allergy   Social History   Tobacco Use  . Smoking status: Former Smoker    Types: Cigarettes  . Smokeless tobacco: Never Used  . Tobacco comment: 1 cigerette a dayb 2-3 cigerettes per week  Vaping Use  . Vaping Use: Never used  Substance Use Topics  . Alcohol use: Yes    Alcohol/week: 2.0 standard drinks    Types: 2 Cans of beer per week    Comment: every other day  . Drug use: No     Family Hx: The patient's family history includes Diabetes in his sister; Heart disease in an other family member; Hyperlipidemia in his sister; Kidney disease in his mother.  ROS   EKGs/Labs/Other Test Reviewed:    EKG:  EKG is  ordered today.  The ekg ordered today demonstrates sinus  rhythm, HR 82, left anterior fascicular block, right bundle branch block, first-degree AV block (PR 392), QTC 468,?  Nonconducted PAC  Recent Labs: 10/10/2019: TSH 2.150 10/11/2019: Hemoglobin 8.9; Magnesium 1.7; Platelets 274 10/12/2019: ALT 8; BUN 11; Creatinine, Ser 0.99; Potassium 3.6; Sodium 130   Recent Lipid Panel Lab Results  Component Value Date/Time   CHOL 246 (H) 02/28/2014 03:08 PM   TRIG 588 (H) 02/28/2014 03:08 PM   HDL 41 02/28/2014 03:08 PM   CHOLHDL 6.0 02/28/2014 03:08 PM   LDLCALC NOT CALC 02/28/2014 03:08 PM   LDLDIRECT 108 (H) 03/01/2014 04:09 AM  Physical Exam:    VS:  BP 128/79   Pulse 89   Ht 5' 10"  (1.778 m)   Wt (!) 314 lb 9.6 oz (142.7 kg)   BMI 45.14 kg/m     Wt Readings from Last 3 Encounters:  04/02/20 (!) 314 lb 9.6 oz (142.7 kg)  02/14/20 (!) 303 lb 11.2 oz (137.8 kg)  12/26/19 (!) 312 lb (141.5 kg)    GEN: Well nourished, well developed in no acute distress HEENT: Normal NECK: No JVD; No carotid bruits LYMPHATICS: No lymphadenopathy CARDIAC:irregularly irregular, no murmurs, rubs, gallops RESPIRATORY:  Clear to auscultation without rales, wheezing or rhonchi  ABDOMEN: Soft, non-tender, non-distended MUSCULOSKELETAL:  No edema; No deformity  SKIN: Warm and dry NEUROLOGIC:  Alert and oriented x 3 PSYCHIATRIC:  Normal affect     EKG was performed in the office today and showed NSR with sinus arrhythmia, Mobitz type 1 second degree AVB, RBBB  ASSESSMENT & PLAN:    1. Bifascicular block/Second degree AV block, Mobitz type I -He has evidence of conduction system disease.  -He does not have evidence of symptomatic bradycardia.   -There remains no indication for pacemaker at this point.   -We discussed symptoms that would potentially indicate worsening rhythm.   -Avoid AV nodal blocking agents.  2. Coronary artery disease involving native coronary artery of native heart without angina pectoris -he has a hx of nonobstructive disease by  cardiac catheterization in 2018.   -he denies any anginal sx - Continue aspirin, statin.  3. PAF (paroxysmal atrial fibrillation) ([place]) -He has a history of atrial fibrillation in the setting of an admission for infection.   -He was previously on Apixaban.   -He was nonadherent to this and it was not continued given his history of alcohol abuse.   -He has moved back home from [facility] assisted living facility.  He has completed rehab -He has not fallen any and is not having to use a walker -he tells me he his not drinking ETOH -I think we should restart his apixaban given his CHADS2VASC score of 3 -I have stressed the importance of abstaining from ETOH when on blood thinners and the risks involved with blood thinner if he resumes drinking including falling and hitting his head resulting in head bleed -start Apixaban 31m BID -check CMET and CBC -2 week ziopatch to assess for higher degree AVB    :[id number]} CHA2DS2-VASc Score = 3  This indicates a 3.2% annual risk of stroke.  4. Essential hypertension -BP controlled on exam today -continue Losartan 569mdaily, amlodipine 1030maily  5. Alcohol abuse -He is currently not drinking as he is in assisted living facility.   -He notes that he had significant difficulty after his fiance passed away.  She died from [id number]-67mplications.   -He has been unable to work for the past 2 years and has a difficult living situation.     6.  HLD -LDL goal < 70 -check FLP and ALT -continue atorvastatin 50m50mily  Dispo: 6 months  Medication Adjustments/Labs and Tests Ordered: Current medicines are reviewed at length with the patient today.  Concerns regarding medicines are outlined above.  Tests Ordered: No orders of the defined types were placed in this encounter.  Medication Changes: Meds ordered this encounter  Medications  . DISCONTD: atorvastatin (LIPITOR) 80 MG tablet    Sig: Take 1 tablet (80 mg total) by mouth daily.     Dispense:  90 tablet    Refill:  3    Dose increased    Signed, Fransico Him, MD  04/02/2020 3:13 PM    [facility] [address], [place], [place]  [address] Phone: [phone]; Fax: [phone]

## 2020-04-04 ENCOUNTER — Other Ambulatory Visit: Payer: Medicaid Other

## 2020-04-07 ENCOUNTER — Other Ambulatory Visit (INDEPENDENT_AMBULATORY_CARE_PROVIDER_SITE_OTHER): Payer: Medicaid Other

## 2020-04-07 DIAGNOSIS — I441 Atrioventricular block, second degree: Secondary | ICD-10-CM | POA: Diagnosis not present

## 2020-04-08 ENCOUNTER — Other Ambulatory Visit: Payer: Medicaid Other

## 2020-04-08 ENCOUNTER — Other Ambulatory Visit: Payer: Self-pay

## 2020-04-08 DIAGNOSIS — I480 Paroxysmal atrial fibrillation: Secondary | ICD-10-CM

## 2020-04-08 DIAGNOSIS — E7800 Pure hypercholesterolemia, unspecified: Secondary | ICD-10-CM

## 2020-04-08 LAB — CBC
Hematocrit: 38.7 % (ref 37.5–51.0)
Hemoglobin: 13.2 g/dL (ref 13.0–17.7)
MCH: 33.4 pg — ABNORMAL HIGH (ref 26.6–33.0)
MCHC: 34.1 g/dL (ref 31.5–35.7)
MCV: 98 fL — ABNORMAL HIGH (ref 79–97)
Platelets: 252 10*3/uL (ref 150–450)
RBC: 3.95 x10E6/uL — ABNORMAL LOW (ref 4.14–5.80)
RDW: 13.3 % (ref 11.6–15.4)
WBC: 8.6 10*3/uL (ref 3.4–10.8)

## 2020-04-08 LAB — COMPREHENSIVE METABOLIC PANEL
ALT: 5 IU/L (ref 0–44)
AST: 8 IU/L (ref 0–40)
Albumin/Globulin Ratio: 1.3 (ref 1.2–2.2)
Albumin: 4.3 g/dL (ref 3.8–4.9)
Alkaline Phosphatase: 70 IU/L (ref 44–121)
BUN/Creatinine Ratio: 18 (ref 9–20)
BUN: 20 mg/dL (ref 6–24)
Bilirubin Total: 0.5 mg/dL (ref 0.0–1.2)
CO2: 24 mmol/L (ref 20–29)
Calcium: 9.3 mg/dL (ref 8.7–10.2)
Chloride: 99 mmol/L (ref 96–106)
Creatinine, Ser: 1.09 mg/dL (ref 0.76–1.27)
GFR calc Af Amer: 87 mL/min/{1.73_m2} (ref >59)
GFR calc non Af Amer: 75 mL/min/{1.73_m2} (ref >59)
Globulin, Total: 3.3 g/dL (ref 1.5–4.5)
Glucose: 155 mg/dL — ABNORMAL HIGH (ref 65–99)
Potassium: 4.7 mmol/L (ref 3.5–5.2)
Sodium: 137 mmol/L (ref 134–144)
Total Protein: 7.6 g/dL (ref 6.0–8.5)

## 2020-04-08 LAB — LIPID PANEL
Chol/HDL Ratio: 2.3 ratio (ref 0.0–5.0)
Cholesterol, Total: 92 mg/dL — ABNORMAL LOW (ref 100–199)
HDL: 40 mg/dL (ref >39)
LDL Chol Calc (NIH): 35 mg/dL (ref 0–99)
Triglycerides: 87 mg/dL (ref 0–149)
VLDL Cholesterol Cal: 17 mg/dL (ref 5–40)

## 2020-04-24 ENCOUNTER — Ambulatory Visit: Payer: Medicaid Other | Admitting: Internal Medicine

## 2020-04-24 ENCOUNTER — Encounter: Payer: Self-pay | Admitting: Internal Medicine

## 2020-04-24 ENCOUNTER — Other Ambulatory Visit: Payer: Self-pay

## 2020-04-24 VITALS — BP 136/84 | HR 76 | Temp 98.1°F | Ht 70.0 in | Wt 316.1 lb

## 2020-04-24 DIAGNOSIS — L03818 Cellulitis of other sites: Secondary | ICD-10-CM | POA: Diagnosis not present

## 2020-04-24 DIAGNOSIS — E785 Hyperlipidemia, unspecified: Secondary | ICD-10-CM | POA: Diagnosis not present

## 2020-04-24 DIAGNOSIS — I480 Paroxysmal atrial fibrillation: Secondary | ICD-10-CM | POA: Diagnosis not present

## 2020-04-24 DIAGNOSIS — F322 Major depressive disorder, single episode, severe without psychotic features: Secondary | ICD-10-CM | POA: Insufficient documentation

## 2020-04-24 DIAGNOSIS — N492 Inflammatory disorders of scrotum: Secondary | ICD-10-CM | POA: Diagnosis not present

## 2020-04-24 DIAGNOSIS — R5381 Other malaise: Secondary | ICD-10-CM | POA: Diagnosis not present

## 2020-04-24 DIAGNOSIS — I441 Atrioventricular block, second degree: Secondary | ICD-10-CM

## 2020-04-24 DIAGNOSIS — I10 Essential (primary) hypertension: Secondary | ICD-10-CM

## 2020-04-24 DIAGNOSIS — E1110 Type 2 diabetes mellitus with ketoacidosis without coma: Secondary | ICD-10-CM | POA: Diagnosis not present

## 2020-04-24 MED ORDER — METFORMIN HCL 500 MG PO TABS: 750.0000 mg | ORAL_TABLET | Freq: Every day | ORAL | 3 refills | Status: DC

## 2020-04-24 MED ORDER — FLUOXETINE HCL 20 MG PO CAPS: 20.0000 mg | ORAL_CAPSULE | Freq: Every day | ORAL | 3 refills | Status: DC

## 2020-04-24 MED ORDER — ATORVASTATIN CALCIUM 80 MG PO TABS: 80.0000 mg | ORAL_TABLET | Freq: Every day | ORAL | Status: DC

## 2020-04-24 MED ORDER — AMLODIPINE BESYLATE 10 MG PO TABS: 10.0000 mg | ORAL_TABLET | Freq: Every day | ORAL | 3 refills | Status: DC

## 2020-04-24 MED ORDER — AMOXICILLIN-POT CLAVULANATE 875-125 MG PO TABS: 1.0000 | ORAL_TABLET | Freq: Two times a day (BID) | ORAL | 0 refills | Status: AC

## 2020-04-24 MED ORDER — LOSARTAN POTASSIUM 50 MG PO TABS: 50.0000 mg | ORAL_TABLET | Freq: Every day | ORAL | 1 refills | Status: DC

## 2020-04-24 MED ORDER — GABAPENTIN 300 MG PO CAPS: 300.0000 mg | ORAL_CAPSULE | Freq: Three times a day (TID) | ORAL | 1 refills | Status: DC

## 2020-04-24 NOTE — Assessment & Plan Note (Signed)
He notes that he had received home health following his discharge from [place] however was recently discharged from it. He requires assistance with most ADLs and iADLs.  He is also requesting a rollater. He is currently renting a walker and some other DMEs.  DME order placed for walker: Pt has a mobility limitation that significantly impairs their ability to participate in mobility related ADLs. Pt is able to safely use a walker. The functional mobility deficit can be sufficiently resolved by the use of a walker.  Paperwork for [employer] completed

## 2020-04-24 NOTE — Assessment & Plan Note (Addendum)
Follows with [facility] cardiology. Zio patch ordered at last OV on 11/10 to assess for higher degree AVBs. HR is 76 in the office today. Rhythm is irregularly irregular. No change in management at this time. Continue to follow with cardiology.

## 2020-04-24 NOTE — Assessment & Plan Note (Addendum)
Current prescribed medications: amlodipine 53m, losartan 557mDenies adverse medication effects. Has not been taking losartan. Blood pressure is reasonable in the clinic today Plan: continue amlodipine. Resume losartan.

## 2020-04-24 NOTE — Assessment & Plan Note (Signed)
Current medications: metformin 712m daily, novolog mix 70/30 4U daily Unable to check his glucoses at home recently due to loosing his meter. Spoke with DButch Pennytoday who was able to provide one to him in the clinic today.  Denies having any recent feelings of hypoglycemia.  Plan -referral placed to ophthalmology -A1C ordered however does not appear that it was collected prior to him leaving.  -due to the number of other issues he had at today's appointment, will have him come back for a separate appointment to discuss diabetes and labs -metformin refilled

## 2020-04-24 NOTE — Assessment & Plan Note (Signed)
Pt notes that he has required I&D in the past for similar issues. He is unsure when this started but he notes that it started draining about a week ago. He notes that he was able to pop it like a pimple, releasing a bunch of purulent fluid and improving the associated pain. He denies fevers, chills or lightheadedness. No known inciting event, no trauma.  Assessment: no systemic symptoms at todays visit. Exam does reveal skin thickening and a small wound involving the left scrotum. I do not think he requires inpatient treatment based on today's visit however he is high risk for poor wound healing due to his mobillity issues affecting hygeine and type 2 diabetes. Plan:  -referral placed to urology -10d course of augmentin sent to pharmacy -instructed him to follow up with me for a wound recheck if he has not been able to be seen by urology by the time antibiotics are completed. -return precautions discussed

## 2020-04-24 NOTE — Progress Notes (Signed)
Office Visit   Patient ID: Allen Gould, male    DOB: [birth year], 54 y.o.   MRN: [id number]  Subjective:  CC: PCS needs, DME needs, chronic diabetes, chronic hypertension, chronic depression, scrotal wound   54 y.o. presents today for the above.  LOV was 9/23 at which time he had been really struggling at home after a recent hospital discharge for starvation ketosis. I had helped arrange for him to be admitted back to [facility] at that time. At today's appointment, he notes that he ended up not going but things have been going better at home.  He is actually looking a lot better than he had on our previous appointment.  Scrotal wound Pt notes that he has required I&D in the past for similar issues. He is unsure when this started but he notes that it started draining about a week ago. He notes that he was able to pop it like a pimple, releasing a bunch of purulent fluid and improving the associated pain. He denies fevers, chills or lightheadedness. No known inciting event, no trauma.   PCS needs He also notes that he had received home health following his discharge from [facility] however was recently discharged from it. He requires assistance with most ADLs and iADLs.  He is also requesting a rollater. He is currently renting a walker and some other DMEs.  Type 2 DM He reports good compliance with his medications. He notes that his glucometer was lost a while ago and has not been able to check his glucoses at home. He denies any issues with feeling hypoglycemic. He has difficulty complying with a diabetic diet due to mobility and financial issues.  2nd degree AVB/ Hypertension/ paroxysmal atrial fibrillation/ Hyperlipidemia He follows with [facility] cardiology with Dr. Lajoyce Lauber. LOV was 04/02/20. EKG at that office visit revealed he was in Mobitz type 1 AVB. There was no indication for a pacemaker on that visit.  He also has a history of paroxysmal atrial fibrillation however was not a  candidate for eliquis due to concurrent alcohol use and medication non-compliance. On the 11/10 OV, eliquis was started.  He reports that he has not been taking losartan, as previously prescribed, due to being out. He has been complaint with his other antihypertensives and denies issues with lightheadedness or falls. His lipitor was increased to 32m at 11/10 OV.  Depression He notes that he has been out of prozac for a while now and has been feeling his mood become more depressed. He feels like his symptoms were pretty well controlled on the prozac. Denies suicidal thoughts or plans.    ACTIVE MEDICATIONS   Current Outpatient Medications on File Prior to Visit  Medication Sig Dispense Refill  . apixaban (ELIQUIS) 5 MG TABS tablet Take 1 tablet (5 mg total) by mouth 2 (two) times daily. 180 tablet 3  . aspirin 81 MG EC tablet Take 81 mg by mouth daily. Swallow whole.    . Ergocalciferol (VITAMIN D2 PO) Take 1 capsule by mouth as directed.    . famotidine (PEPCID) 10 MG tablet Take 1 tablet (10 mg total) by mouth 2 (two) times daily. 60 tablet 0  . insulin aspart protamine- aspart (NOVOLOG MIX 70/30) (70-30) 100 UNIT/ML injection Inject 4 Units into the skin.    .Marland Kitchenvitamin B-12 (CYANOCOBALAMIN) 1000 MCG tablet Take 1,000 mcg by mouth daily.     No current facility-administered medications on file prior to visit.    ROS  Review of Systems  Constitutional: Negative for chills and fever.  Respiratory: Negative for cough and shortness of breath.   Cardiovascular: Negative for chest pain.  Genitourinary: Positive for genital sores and scrotal swelling.  Musculoskeletal: Positive for arthralgias.  Skin: Positive for wound.  Neurological: Negative for dizziness, syncope, light-headedness and headaches.  Psychiatric/Behavioral: Negative for self-injury and suicidal ideas.    Objective:   BP 136/84 (BP Location: Left Arm, Patient Position: Sitting, Cuff Size: Large)   Pulse 76   Temp 98.1  F (36.7 C) (Oral)   Ht 5' 10"  (1.778 m)   Wt (!) 316 lb 1.6 oz (143.4 kg)   SpO2 99%   BMI 45.36 kg/m  Wt Readings from Last 3 Encounters:  04/24/20 (!) 316 lb 1.6 oz (143.4 kg)  04/02/20 (!) 314 lb 9.6 oz (142.7 kg)  02/14/20 (!) 303 lb 11.2 oz (137.8 kg)   BP Readings from Last 3 Encounters:  04/24/20 136/84  04/02/20 128/79  02/14/20 (!) 122/95   Physical Exam Constitutional:      Appearance: Normal appearance. He is obese. He is not ill-appearing.  Cardiovascular:     Rate and Rhythm: Normal rate. Rhythm irregular.  Pulmonary:     Effort: Pulmonary effort is normal.  Genitourinary:    Comments: Chaperoned by Dr. Jimmye Norman. There is skin thickening of the left scrotum. There is a small wound present on the left scrotum. No discharge present at time of exam. Mild tenderness to palpation. No discernable fluid collection.  Musculoskeletal:     Right lower leg: No edema.     Left lower leg: No edema.     Comments: Sitting in a wheelchair. Slow gate. Difficulty sitting up from wheelchair to transfer to bed.  Neurological:     Mental Status: He is alert and oriented to person, place, and time.  Psychiatric:        Mood and Affect: Mood normal.     Health Maintenance:   Health Maintenance  Topic Date Due  . COVID-19 Vaccine (1) Never done  . TETANUS/TDAP  Never done  . COLONOSCOPY  Never done  . FOOT EXAM  08/30/2014  . OPHTHALMOLOGY EXAM  03/01/2015  . HEMOGLOBIN A1C  12/02/2019  . INFLUENZA VACCINE  08/21/2020 (Originally 12/23/2019)  . LIPID PANEL  04/08/2021  . PNEUMOCOCCAL POLYSACCHARIDE VACCINE AGE 26-64 HIGH RISK  Completed  . Hepatitis C Screening  Completed  . HIV Screening  Completed     Assessment & Plan:   Problem List Items Addressed This Visit      Cardiovascular and Mediastinum   Hypertension - Primary    Current prescribed medications: amlodipine 44m, losartan 565mDenies adverse medication effects. Has not been taking losartan. Blood pressure  is reasonable in the clinic today Plan: continue amlodipine. Resume losartan.       Relevant Medications   atorvastatin (LIPITOR) 80 MG tablet   losartan (COZAAR) 50 MG tablet   amLODipine (NORVASC) 10 MG tablet   Other Relevant Orders   Basic metabolic panel (Completed)   Second degree AV block, Mobitz type I    [facility] Zio patch ordered at last OV on 11/10 to assess for higher degree AVBs. HR is 76 in the office today. Rhythm is irregularly irregular. No change in management at this time. Continue to follow with cardiology.      Relevant Medications   atorvastatin (LIPITOR) 80 MG tablet   losartan (COZAAR) 50 MG tablet   amLODipine (NORVASC) 10 MG tablet   PAF (  paroxysmal atrial fibrillation) ([facility])    Rate controlled He has a history of paroxysmal atrial fibrillation however was not a candidate for eliquis in the past due to concurrent alcohol use and medication non-compliance. On the 11/10 OV, he had reported cessation from alcohol, so eliquis was started.  Plan: Continue eliquis 62m BID Monitor closely for increased falls or resuming alcohol use      Relevant Medications   atorvastatin (LIPITOR) 80 MG tablet   losartan (COZAAR) 50 MG tablet   amLODipine (NORVASC) 10 MG tablet     Endocrine   DM (diabetes mellitus) type 2, uncontrolled, without ketoacidosis    Current medications: metformin 7543mdaily, novolog mix 70/30 4U daily Unable to check his glucoses at home recently due to loosing his meter. Spoke with DoButch Pennyoday who was able to provide one to him in the clinic today.  Denies having any recent feelings of hypoglycemia.  Plan -referral placed to ophthalmology -A1C ordered however does not appear that it was collected prior to him leaving.  -due to the number of other issues he had at today's appointment, will have him come back for a separate appointment to discuss diabetes and labs -metformin refilled      Relevant Medications    atorvastatin (LIPITOR) 80 MG tablet   losartan (COZAAR) 50 MG tablet   gabapentin (NEURONTIN) 300 MG capsule   metFORMIN (GLUCOPHAGE) 500 MG tablet   Other Relevant Orders   POC Hbg A1C   Ambulatory referral to Ophthalmology     Genitourinary   Cellulitis of scrotum    Pt notes that he has required I&D in the past for similar issues. He is unsure when this started but he notes that it started draining about a week ago. He notes that he was able to pop it like a pimple, releasing a bunch of purulent fluid and improving the associated pain. He denies fevers, chills or lightheadedness. No known inciting event, no trauma.  Assessment: no systemic symptoms at todays visit. Exam does reveal skin thickening and a small wound involving the left scrotum. I do not think he requires inpatient treatment based on today's visit however he is high risk for poor wound healing due to his mobillity issues affecting hygeine and type 2 diabetes. Plan:  -referral placed to urology -10d course of augmentin sent to pharmacy -instructed him to follow up with me for a wound recheck if he has not been able to be seen by urology by the time antibiotics are completed. -return precautions discussed      Relevant Medications   amoxicillin-clavulanate (AUGMENTIN) 875-125 MG tablet     Other   Dyslipidemia   Relevant Medications   atorvastatin (LIPITOR) 80 MG tablet   Severe depression ([place]   He notes that he has been out of prozac for a while now and has been feeling his mood become more depressed. He feels like his symptoms were pretty well controlled on the prozac. Denies suicidal thoughts or plans. Prozac refilled at today's visit. F/u in 6 mo or sooner if symptoms do not improve.      Relevant Medications   FLUoxetine (PROZAC) 20 MG capsule   Physical deconditioning    He notes that [facility]. He requires  assistance with most ADLs and iADLs.  He is also requesting a rollater. He is currently renting a walker and some other DMEs.  DME order placed for  walker: Pt has a mobility limitation that significantly impairs their ability to participate in mobility related ADLs. Pt is able to safely use a walker. The functional mobility deficit can be sufficiently resolved by the use of a walker.  Paperwork for [employer] completed      Relevant Orders   For home use only DME 4 wheeled rolling walker with seat ([id number])    Other Visit Diagnoses    Cellulitis of other specified site       Relevant Orders   Ambulatory referral to Urology        Pt seen in conjunction with Dr. Marty Heck, MD Internal Medicine Resident PGY-2 [facility] Pager: [phone] 04/25/2020 11:49 AM

## 2020-04-24 NOTE — Assessment & Plan Note (Addendum)
Rate controlled He has a history of paroxysmal atrial fibrillation however was not a candidate for eliquis in the past due to concurrent alcohol use and medication non-compliance. On the 11/10 OV, he had reported cessation from alcohol, so eliquis was started.  Plan: Continue eliquis 40m BID Monitor closely for increased falls or resuming alcohol use

## 2020-04-24 NOTE — Patient Instructions (Signed)
I am happy to see you are doing better since our last visit. I have placed a referral to urology for ongoing management of your scrotal wound.  I will also order a rollator walker for you as well as personal care services. I have refilled your prescriptions.  I am checking an A1C and your renal function and electrolytes. I will call you if we need to change anything.  If you have not seen urology in 2 weeks, I would like to see you again to recheck that wound.

## 2020-04-24 NOTE — Assessment & Plan Note (Signed)
He notes that he has been out of prozac for a while now and has been feeling his mood become more depressed. He feels like his symptoms were pretty well controlled on the prozac. Denies suicidal thoughts or plans. Prozac refilled at today's visit. F/u in 6 mo or sooner if symptoms do not improve.

## 2020-04-25 ENCOUNTER — Telehealth: Payer: Self-pay | Admitting: *Deleted

## 2020-04-25 LAB — BASIC METABOLIC PANEL
BUN/Creatinine Ratio: 24 — ABNORMAL HIGH (ref 9–20)
BUN: 29 mg/dL — ABNORMAL HIGH (ref 6–24)
CO2: 20 mmol/L (ref 20–29)
Calcium: 9.7 mg/dL (ref 8.7–10.2)
Chloride: 99 mmol/L (ref 96–106)
Creatinine, Ser: 1.22 mg/dL (ref 0.76–1.27)
GFR calc Af Amer: 76 mL/min/{1.73_m2} (ref >59)
GFR calc non Af Amer: 66 mL/min/{1.73_m2} (ref >59)
Glucose: 90 mg/dL (ref 65–99)
Potassium: 5.1 mmol/L (ref 3.5–5.2)
Sodium: 135 mmol/L (ref 134–144)

## 2020-04-25 MED ORDER — ACCU-CHEK GUIDE VI STRP: ORAL_STRIP | 12 refills | Status: DC

## 2020-04-25 MED ORDER — ACCU-CHEK SOFTCLIX LANCETS MISC: 12 refills | Status: DC

## 2020-04-25 NOTE — Progress Notes (Signed)
Internal Medicine Clinic Attending  I saw and evaluated the patient.  I personally confirmed the key portions of the history and exam documented by Dr. Darrick Meigs and I reviewed pertinent patient test results.  The assessment, diagnosis, and plan were formulated together and I agree with the documentation in the resident's note. On my exam there was a discrete thickening in the scrotal skin, and scarring where he has had previous draining abscesses.  Further attention best guided by a dermatologist as Dr. Darrick Meigs has noted.  I agree with broad spectrum antibiotics.

## 2020-04-25 NOTE — Telephone Encounter (Signed)
CM sent to Skeet Latch at [facility] for Bariatric Rollator. F2F was 04/24/2020. [name], BSN, RN-BC

## 2020-04-25 NOTE — Addendum Note (Signed)
Addended by: [name] on: 04/25/2020 05:10 PM   Modules accepted: Orders

## 2020-04-30 NOTE — Telephone Encounter (Signed)
Samples, Matthias Hughs, Orvis Brill, RN; Skeet Latch; [place], [place]; [place], [place]; Samples, [place]; 1 other  Got it and reviewing now. Thanks.

## 2020-04-30 NOTE — Telephone Encounter (Signed)
Samples, [employer], [place]; [place], Orvis Brill, Therapist, sports; Skeet Latch; [place], [place]; [place], [place]; 1 other  Order has been created and shipped per patient request. They are on back order at this time but he is okay to wait until they are in stock.   Thanks.

## 2020-05-13 ENCOUNTER — Other Ambulatory Visit: Payer: Self-pay | Admitting: Internal Medicine

## 2020-05-13 ENCOUNTER — Telehealth: Payer: Self-pay

## 2020-05-13 NOTE — Telephone Encounter (Signed)
I had completed PCS paperwork at his LOV.  Amber or Janet--do you know where we are at in that process?

## 2020-05-13 NOTE — Telephone Encounter (Signed)
Patient has Medicaid/$3 copay....does not qualify for pt. Assistance for most programs.

## 2020-05-13 NOTE — Telephone Encounter (Signed)
Pls contact pt [phone]

## 2020-05-13 NOTE — Telephone Encounter (Signed)
  Chronic Care Management   Outreach Note  05/13/2020 Name: Allen Gould MRN: [id number] DOB: 1966-01-19  Referred by: [name], MD Reason for referral : Advice Only   [name] is enrolled in a Managed Medicaid Health Plan: Yes   He is not a CCM patient so I cannot speak to where things are with PCS. A CCM referral can be placed but please note that my last day at [facility] is on 05/21/20 which does not give much time to address multiple needs of patient.       [name], [place] Coordination Social Worker [place] [phone]

## 2020-05-13 NOTE — Telephone Encounter (Signed)
Amber, please disregard. I had his paperwork completed but had not given it to [employer], Therapist, sports. She now has the paperwork so we will be able to move forward with trying to get him PCS. Thank you!

## 2020-05-13 NOTE — Telephone Encounter (Signed)
Pt needs assist w/ a PCS worker Pt needs financial assist Pt needs help w/ med costs Nurse sending to [employer] [email or web address] Georgina Peer, lauren, [facility], dr christian Please call him ASAP he is in a rough spot

## 2020-05-14 NOTE — Telephone Encounter (Signed)
Completed and signed Request for Independent Assessment for [place] of Medical Need faxed to [place] at [phone] on 05/13/2020. Fax confirmation receipt received.  Patient made aware of reason for delay and to expect call from [facility] for assessment of needs. [name], BSN, RN-BC

## 2020-05-15 NOTE — Telephone Encounter (Addendum)
Received fax from Allen Gould at [facility] stating Beneficiary is enrolled in a [place] so he needs to contact Allen Gould for [facility]. Call placed to patient. Explained he will need to contact his insurance company. He will call back after he speaks with them.  Patient also requests no information regarding his well being be discussed with his sister, Allen Gould. Patient does not have an alternate contact he wishes to name. Will forward to [place] staff to have sister removed from his profile. Allen Gould, BSN, RN-BC

## 2020-05-19 ENCOUNTER — Telehealth: Payer: Self-pay

## 2020-05-19 DIAGNOSIS — G4733 Obstructive sleep apnea (adult) (pediatric): Secondary | ICD-10-CM

## 2020-05-19 DIAGNOSIS — I441 Atrioventricular block, second degree: Secondary | ICD-10-CM

## 2020-05-19 DIAGNOSIS — I480 Paroxysmal atrial fibrillation: Secondary | ICD-10-CM

## 2020-05-19 NOTE — Telephone Encounter (Signed)
Please send to Gae Bon to set up appt with DME to get a new mask fitting

## 2020-05-19 NOTE — Telephone Encounter (Signed)
-----   Message from Shellia Cleverly, RN sent at 05/19/2020 11:43 AM EST -----  ----- Message ----- From: [name], MD Sent: 05/14/2020  12:04 PM EST To: [name], MD, Cv [facility] Triage  Patient has significant atrial arrhythmias on monitor as well as significant heart block during sleep likely related to OSA.  Please find out if he has gone back on his CPAP and using nightly. Also refer to EP for further evaluation of Rx of PAT in setting of nocturnal AV block

## 2020-05-19 NOTE — Telephone Encounter (Signed)
The patient has been notified of the result and verbalized understanding.  All questions (if any) were answered. Referral placed for EP  Patient states that he has been using his CPAP nightly. He would like a new mask - he states that his current one is drying him out.

## 2020-05-28 ENCOUNTER — Telehealth: Payer: Self-pay

## 2020-05-28 NOTE — Telephone Encounter (Signed)
Patient has been made aware that Allen Gould has denied PCS as patient has managed medicaid and he must contact his insurance company for [employer]. [name], BSN, RN-BC

## 2020-05-28 NOTE — Telephone Encounter (Signed)
Requesting PCS, please call pt back.  

## 2020-05-28 NOTE — Telephone Encounter (Signed)
Order placed to [employer].

## 2020-06-02 ENCOUNTER — Inpatient Hospital Stay (HOSPITAL_COMMUNITY)
Admission: EM | Admit: 2020-06-02 | Discharge: 2020-06-04 | DRG: 291 | Disposition: A | Discharge status: Home / Self Care | Payer: Medicaid Other | Attending: Internal Medicine | Admitting: Internal Medicine

## 2020-06-02 ENCOUNTER — Emergency Department (HOSPITAL_COMMUNITY): Payer: Medicaid Other

## 2020-06-02 ENCOUNTER — Encounter (HOSPITAL_COMMUNITY): Payer: Self-pay | Admitting: Emergency Medicine

## 2020-06-02 DIAGNOSIS — Z7982 Long term (current) use of aspirin: Secondary | ICD-10-CM | POA: Diagnosis not present

## 2020-06-02 DIAGNOSIS — I5033 Acute on chronic diastolic (congestive) heart failure: Secondary | ICD-10-CM | POA: Diagnosis present

## 2020-06-02 DIAGNOSIS — I4510 Unspecified right bundle-branch block: Secondary | ICD-10-CM | POA: Diagnosis present

## 2020-06-02 DIAGNOSIS — I110 Hypertensive heart disease with heart failure: Secondary | ICD-10-CM | POA: Diagnosis present

## 2020-06-02 DIAGNOSIS — Z833 Family history of diabetes mellitus: Secondary | ICD-10-CM

## 2020-06-02 DIAGNOSIS — Z91013 Allergy to seafood: Secondary | ICD-10-CM

## 2020-06-02 DIAGNOSIS — I441 Atrioventricular block, second degree: Secondary | ICD-10-CM | POA: Diagnosis present

## 2020-06-02 DIAGNOSIS — E119 Type 2 diabetes mellitus without complications: Secondary | ICD-10-CM | POA: Diagnosis present

## 2020-06-02 DIAGNOSIS — I480 Paroxysmal atrial fibrillation: Secondary | ICD-10-CM | POA: Diagnosis present

## 2020-06-02 DIAGNOSIS — G4733 Obstructive sleep apnea (adult) (pediatric): Secondary | ICD-10-CM | POA: Diagnosis present

## 2020-06-02 DIAGNOSIS — Z7901 Long term (current) use of anticoagulants: Secondary | ICD-10-CM

## 2020-06-02 DIAGNOSIS — Z841 Family history of disorders of kidney and ureter: Secondary | ICD-10-CM | POA: Diagnosis not present

## 2020-06-02 DIAGNOSIS — K219 Gastro-esophageal reflux disease without esophagitis: Secondary | ICD-10-CM | POA: Diagnosis present

## 2020-06-02 DIAGNOSIS — I10 Essential (primary) hypertension: Secondary | ICD-10-CM | POA: Diagnosis present

## 2020-06-02 DIAGNOSIS — R0602 Shortness of breath: Secondary | ICD-10-CM | POA: Diagnosis not present

## 2020-06-02 DIAGNOSIS — F32A Depression, unspecified: Secondary | ICD-10-CM | POA: Diagnosis present

## 2020-06-02 DIAGNOSIS — Z7984 Long term (current) use of oral hypoglycemic drugs: Secondary | ICD-10-CM

## 2020-06-02 DIAGNOSIS — Z87891 Personal history of nicotine dependence: Secondary | ICD-10-CM

## 2020-06-02 DIAGNOSIS — J9601 Acute respiratory failure with hypoxia: Secondary | ICD-10-CM | POA: Diagnosis present

## 2020-06-02 DIAGNOSIS — Z20822 Contact with and (suspected) exposure to covid-19: Secondary | ICD-10-CM | POA: Diagnosis present

## 2020-06-02 DIAGNOSIS — Z83438 Family history of other disorder of lipoprotein metabolism and other lipidemia: Secondary | ICD-10-CM

## 2020-06-02 DIAGNOSIS — Z79899 Other long term (current) drug therapy: Secondary | ICD-10-CM | POA: Diagnosis not present

## 2020-06-02 DIAGNOSIS — R0789 Other chest pain: Secondary | ICD-10-CM

## 2020-06-02 DIAGNOSIS — E1110 Type 2 diabetes mellitus with ketoacidosis without coma: Secondary | ICD-10-CM | POA: Diagnosis present

## 2020-06-02 DIAGNOSIS — R0902 Hypoxemia: Secondary | ICD-10-CM

## 2020-06-02 DIAGNOSIS — I5030 Unspecified diastolic (congestive) heart failure: Secondary | ICD-10-CM | POA: Diagnosis present

## 2020-06-02 DIAGNOSIS — E785 Hyperlipidemia, unspecified: Secondary | ICD-10-CM | POA: Diagnosis present

## 2020-06-02 LAB — BASIC METABOLIC PANEL
Anion gap: 14 (ref 5–15)
BUN: 21 mg/dL — ABNORMAL HIGH (ref 6–20)
CO2: 20 mmol/L — ABNORMAL LOW (ref 22–32)
Calcium: 9.7 mg/dL (ref 8.9–10.3)
Chloride: 99 mmol/L (ref 98–111)
Creatinine, Ser: 1.05 mg/dL (ref 0.61–1.24)
GFR, Estimated: >60 mL/min (ref >60)
Glucose, Bld: 181 mg/dL — ABNORMAL HIGH (ref 70–99)
Potassium: 3.9 mmol/L (ref 3.5–5.1)
Sodium: 133 mmol/L — ABNORMAL LOW (ref 135–145)

## 2020-06-02 LAB — I-STAT ARTERIAL BLOOD GAS, ED
Acid-Base Excess: 0 mmol/L (ref 0.0–2.0)
Bicarbonate: 23.6 mmol/L (ref 20.0–28.0)
Calcium, Ion: 1.29 mmol/L (ref 1.15–1.40)
HCT: 35 % — ABNORMAL LOW (ref 39.0–52.0)
Hemoglobin: 11.9 g/dL — ABNORMAL LOW (ref 13.0–17.0)
O2 Saturation: 89 %
Patient temperature: 99.1
Potassium: 3.9 mmol/L (ref 3.5–5.1)
Sodium: 137 mmol/L (ref 135–145)
TCO2: 25 mmol/L (ref 22–32)
pCO2 arterial: 36.3 mmHg (ref 32.0–48.0)
pH, Arterial: 7.422 (ref 7.350–7.450)
pO2, Arterial: 56 mmHg — ABNORMAL LOW (ref 83.0–108.0)

## 2020-06-02 LAB — CBC
HCT: 37.8 % — ABNORMAL LOW (ref 39.0–52.0)
Hemoglobin: 12 g/dL — ABNORMAL LOW (ref 13.0–17.0)
MCH: 32.3 pg (ref 26.0–34.0)
MCHC: 31.7 g/dL (ref 30.0–36.0)
MCV: 101.6 fL — ABNORMAL HIGH (ref 80.0–100.0)
Platelets: 238 10*3/uL (ref 150–400)
RBC: 3.72 MIL/uL — ABNORMAL LOW (ref 4.22–5.81)
RDW: 13.5 % (ref 11.5–15.5)
WBC: 8.5 10*3/uL (ref 4.0–10.5)
nRBC: 0 % (ref 0.0–0.2)

## 2020-06-02 LAB — HEPATIC FUNCTION PANEL
ALT: 16 U/L (ref 0–44)
AST: 13 U/L — ABNORMAL LOW (ref 15–41)
Albumin: 3.6 g/dL (ref 3.5–5.0)
Alkaline Phosphatase: 66 U/L (ref 38–126)
Bilirubin, Direct: 0.2 mg/dL (ref 0.0–0.2)
Indirect Bilirubin: 0.1 mg/dL — ABNORMAL LOW (ref 0.3–0.9)
Total Bilirubin: 0.3 mg/dL (ref 0.3–1.2)
Total Protein: 7.5 g/dL (ref 6.5–8.1)

## 2020-06-02 LAB — BRAIN NATRIURETIC PEPTIDE: B Natriuretic Peptide: 93.9 pg/mL (ref 0.0–100.0)

## 2020-06-02 LAB — RESP PANEL BY RT-PCR (FLU A&B, COVID) ARPGX2
Influenza A by PCR: NEGATIVE
Influenza B by PCR: NEGATIVE
SARS Coronavirus 2 by RT PCR: NEGATIVE

## 2020-06-02 LAB — CBG MONITORING, ED: Glucose-Capillary: 138 mg/dL — ABNORMAL HIGH (ref 70–99)

## 2020-06-02 LAB — TROPONIN I (HIGH SENSITIVITY)
Troponin I (High Sensitivity): 24 ng/L — ABNORMAL HIGH (ref <18)
Troponin I (High Sensitivity): 25 ng/L — ABNORMAL HIGH (ref <18)

## 2020-06-02 MED ORDER — GABAPENTIN 300 MG PO CAPS
300.0000 mg | ORAL_CAPSULE | Freq: Every day | ORAL | Status: DC
  Administered 2020-06-03: 300 mg via ORAL
  Filled 2020-06-02: qty 3

## 2020-06-02 MED ORDER — ASPIRIN 81 MG PO CHEW
81.0000 mg | CHEWABLE_TABLET | Freq: Once | ORAL | Status: AC
  Administered 2020-06-02: 81 mg via ORAL
  Filled 2020-06-02: qty 1

## 2020-06-02 MED ORDER — FUROSEMIDE 10 MG/ML IJ SOLN: 60.0000 mg | Freq: Once | INTRAMUSCULAR | Status: DC

## 2020-06-02 MED ORDER — IOHEXOL 350 MG/ML SOLN
75.0000 mL | Freq: Once | INTRAVENOUS | Status: AC | PRN
  Administered 2020-06-02: 75 mL via INTRAVENOUS

## 2020-06-02 MED ORDER — VITAMIN B-12 1000 MCG PO TABS
1000.0000 ug | ORAL_TABLET | Freq: Every day | ORAL | Status: DC
  Administered 2020-06-03 – 2020-06-04 (×2): 1000 ug via ORAL
  Filled 2020-06-02 (×2): qty 1

## 2020-06-02 MED ORDER — FLUOXETINE HCL 20 MG PO CAPS
20.0000 mg | ORAL_CAPSULE | Freq: Every day | ORAL | Status: DC
  Administered 2020-06-03 – 2020-06-04 (×2): 20 mg via ORAL
  Filled 2020-06-02 (×2): qty 1

## 2020-06-02 MED ORDER — INSULIN ASPART 100 UNIT/ML ~~LOC~~ SOLN
0.0000 [IU] | Freq: Three times a day (TID) | SUBCUTANEOUS | Status: DC
  Administered 2020-06-03: 2 [IU] via SUBCUTANEOUS
  Administered 2020-06-03 (×2): 3 [IU] via SUBCUTANEOUS

## 2020-06-02 MED ORDER — FAMOTIDINE 20 MG PO TABS
10.0000 mg | ORAL_TABLET | Freq: Two times a day (BID) | ORAL | Status: DC
  Administered 2020-06-02 – 2020-06-04 (×4): 10 mg via ORAL
  Filled 2020-06-02 (×4): qty 1

## 2020-06-02 MED ORDER — LOSARTAN POTASSIUM 50 MG PO TABS
50.0000 mg | ORAL_TABLET | Freq: Every day | ORAL | Status: DC
  Administered 2020-06-03 – 2020-06-04 (×2): 50 mg via ORAL
  Filled 2020-06-02 (×2): qty 1

## 2020-06-02 MED ORDER — ATORVASTATIN CALCIUM 80 MG PO TABS
80.0000 mg | ORAL_TABLET | Freq: Every day | ORAL | Status: DC
  Administered 2020-06-03: 80 mg via ORAL
  Filled 2020-06-02: qty 1

## 2020-06-02 MED ORDER — AMLODIPINE BESYLATE 10 MG PO TABS
10.0000 mg | ORAL_TABLET | Freq: Every day | ORAL | Status: DC
Start: 2020-06-03 — End: 2020-06-04
  Administered 2020-06-03 – 2020-06-04 (×2): 10 mg via ORAL
  Filled 2020-06-02 (×2): qty 1

## 2020-06-02 MED ORDER — ALBUTEROL SULFATE HFA 108 (90 BASE) MCG/ACT IN AERS
1.0000 | INHALATION_SPRAY | Freq: Four times a day (QID) | RESPIRATORY_TRACT | Status: DC | PRN
  Filled 2020-06-02: qty 6.7

## 2020-06-02 MED ORDER — APIXABAN 5 MG PO TABS
5.0000 mg | ORAL_TABLET | Freq: Two times a day (BID) | ORAL | Status: DC
  Administered 2020-06-02 – 2020-06-04 (×4): 5 mg via ORAL
  Filled 2020-06-02 (×4): qty 1

## 2020-06-02 MED ORDER — POLYETHYLENE GLYCOL 3350 17 G PO PACK: 17.0000 g | PACK | Freq: Every day | ORAL | Status: DC | PRN

## 2020-06-02 MED ORDER — FUROSEMIDE 10 MG/ML IJ SOLN
40.0000 mg | INTRAMUSCULAR | Status: AC
  Administered 2020-06-02: 40 mg via INTRAVENOUS
  Filled 2020-06-02: qty 4

## 2020-06-02 NOTE — ED Provider Notes (Signed)
3:55 PM Care assumed from Dr. Rex Kras.  At time of transfer of care, patient is awaiting a CT PE study to rule out pulmonary embolism as cause of his new hypoxia, chest tightness, and shortness of breath.  Patient had concerns for fluid overload with some edematous legs and his hypoxia in the low 80s on arrival and is now on 4 L however his BNP was not significant elevated.  As the patient is somewhat overweight, this may be falsely low BNP however, we do need to rule out pulmonary embolism as cause of his symptoms.  You will need admission regardless for this new hypoxia as he does not take oxygen at home but we will rule out pulmonary believes him first.  His COVID test is negative.  Troponin is minimally elevated.  Patient will be admitted after work-up.  6:30 PM CT PE study returned showing no evidence of pulmonary embolism but does show some pulmonary edema, effusions, and likely evidence suggesting diastolic heart failure.  Will call medicine for admission for further work-up.   Allen Gould, [name], MD 06/02/20 2240

## 2020-06-02 NOTE — H&P (Cosign Needed Addendum)
Date: 06/02/2020               Patient Name:  Allen Gould MRN: [id number]  DOB: 10-16-1965 Age / Sex: 56 y.o., male   PCP: [name], MD         Medical Service: Internal Medicine Teaching Service         Attending Physician: Dr. Lucious Groves, DO    First Contact: Dr. Sanjuana Letters  Pager: [phone]  Second Contact: Dr. Maudie Mercury Pager: [phone]       After Hours (After 5p/  First Contact Pager: [phone]  weekends / holidays): Second Contact Pager: [phone]   Chief Complaint: Chest pain and shortness of breath  History of Present Illness:   55 year old male with past medical history of A. fib on Eliquis, hypertension, type 2 diabetes, OSA, second-degree AV block type I, who presents the ED for 3 days of chest pain and worsening shortness of breath.  Patient states that chest pain started 3 days ago when he was laying on bed, located at central chest, described pain as tightness/squeezing/pressure, associated with shortness of breath, last 30 minutes - 1 hour, nothing helps with the pain.  Also has chest pain with exertion.  Currently reports feeling sore as a central chest.  Patient has had shortness of breath for couple of years but has gotten worse.  Shortness of breath is worse with exertion and better with rest.  Patient can feel short of breath with walking 10 steps and is limited his ADLs.  He also has to climb a few stairs to get into the house which is difficult for him.  Patient denies fever, chills, sore throat and denies sick contact.[name]  He endorses very mild cough with wheezing that he has to use his inhaler.  He is not using oxygen at home.  He states that he has lower extremity edema for 2-year but has gotten worse recently.  Also endorses orthopnea with 3 pillows.  He reports adherence with his medications and saw Dr. Radford Pax with cardiology recently.  He also saw his PCP Dr. Darrick Meigs in December.  In the ED, oxygen sat initially was 84% and  improved to mid 90s on 4 L.  He was bradycardic and EKG shows junctional rhythm.  Troponins flat 24-25.  BNP normal at 93.  CBC unremarkable.  BMP shows normal creatinine.  CTA rules out PE but shows interstitial pulmonary edema with small bilateral pleural effusion.  Covid test negative  Meds:  Current Meds  Medication Sig  . amLODipine (NORVASC) 10 MG tablet Take 1 tablet (10 mg total) by mouth daily.  [name] apixaban (ELIQUIS) 5 MG TABS tablet Take 1 tablet (5 mg total) by mouth 2 (two) times daily.  [name] aspirin 81 MG EC tablet Take 81 mg by mouth daily. Swallow whole.  [name] atorvastatin (LIPITOR) 80 MG tablet Take 1 tablet (80 mg total) by mouth daily at 6 PM. (Patient taking differently: Take 40 mg by mouth daily at 6 PM.)  . Ergocalciferol (VITAMIN D2 PO) Take 1,250 mg by mouth once a week.  . famotidine (PEPCID) 10 MG tablet Take 1 tablet (10 mg total) by mouth 2 (two) times daily.  [name] FLUoxetine (PROZAC) 20 MG capsule Take 1 capsule (20 mg total) by mouth daily.  [name] gabapentin (NEURONTIN) 300 MG capsule Take 1 capsule (300 mg total) by mouth in the morning, at noon, and at bedtime.  [name] ibuprofen (ADVIL) 600 MG tablet Take 600 mg  by mouth as needed for mild pain.  [name] losartan (COZAAR) 50 MG tablet Take 1 tablet (50 mg total) by mouth daily.  . metFORMIN (GLUCOPHAGE) 500 MG tablet Take 1.5 tablets (750 mg total) by mouth daily with breakfast.  . vitamin B-12 (CYANOCOBALAMIN) 1000 MCG tablet Take 1,000 mcg by mouth daily.     Allergies: Allergies as of 06/02/2020 - Review Complete 06/02/2020  Allergen Reaction Noted  . Shellfish allergy Hives 08/10/2013   Past Medical History:  Diagnosis Date  . Acute kidney injury ([place]) 09/01/2019  . Asthma   . Balanitis 08/09/2014  . Bifascicular block   . Cough 08/09/2014  . Diabetes mellitus without complication ([place])   . DM (diabetes mellitus) type 2, uncontrolled, without ketoacidosis 08/10/2013  . Dyslipidemia 03/01/2014  . Financial difficulties  08/09/2014  . GERD (gastroesophageal reflux disease)   . Health care maintenance 03/01/2014  . Hypertension   . Hypertensive urgency 05/29/2017  . Hypomagnesemia 05/29/2017  . Insomnia 08/29/2013  . Morbid obesity ([place]) 08/29/2013  . Near syncope 05/29/2017  . Odontogenic infection of jaw 05/29/2017  . PAC (premature atrial contraction)   . PAF (paroxysmal atrial fibrillation) ([place])   . Pressure injury of skin 09/02/2019  . PVCs (premature ventricular contractions)   . Right shoulder pain 08/29/2013  . Second degree AV block, Mobitz type I   . Sepsis ([place]) 05/29/2017  . Sleep apnea    uses cpap    Family History:  Father, mother have heart conditions   Social History:  Denies smoking.  Quit smoking 15 years ago, used to smoke 1-2 pack a day. Drink 1/5 of liquor bottle a week Denies other recreational drug use  Review of Systems: A complete ROS was negative except as per HPI.   Physical Exam: Blood pressure (!) 159/80, pulse 66, temperature 98 F (36.7 C), temperature source Axillary, resp. rate 18, height 5\' 10"  (1.778 m), weight (!) 153.5 kg, SpO2 97 %.  Physical Exam Constitutional:      Appearance: He is obese. He is ill-appearing.  HENT:     Head: Normocephalic.  Eyes:     General:        Right eye: No discharge.        Left eye: No discharge.  Cardiovascular:     Rate and Rhythm: Regular rhythm. Bradycardia present.     Comments: 3/6 systolic murmur heard best at left lower sternal border and apex. Could not appreciate JVD given body habitus Pulmonary:     Breath sounds: Wheezing (Mild wheezes) present.  Abdominal:     General: Bowel sounds are normal.     Tenderness: There is no abdominal tenderness.  Musculoskeletal:     Right lower leg: Edema (+1) present.     Left lower leg: Edema (+1) present.  Skin:    General: Skin is warm.  Neurological:     Mental Status: He is alert.  Psychiatric:        Mood and Affect: Mood normal.     EKG: personally reviewed my  interpretation is junctional rhythm   CXR: personally reviewed my interpretation is bilateral basilar atelectasis  Assessment & Plan by Problem: Principal Problem:   Acute respiratory failure with hypoxia and hypoxemia (HCC) Active Problems:   DM (diabetes mellitus) type 2, uncontrolled, without ketoacidosis   Hypertension   Second degree AV block, Mobitz type I   PAF (paroxysmal atrial fibrillation) ([place])   55 year old male with past medical history of A. fib on Eliquis, hypertension,  type 2 diabetes, OSA, second-degree AV block type I, who presents the ED for 3 days of chest pain and worsening shortness of breath, currently being worked up for an acute heart failure exacerbation versus cardiac amyloidosis.   Acute respiratory failure with hypoxia Acute heart failure exacerbation versus cardiac amyloidosis  Patient presented with hypoxia that requires 4 L of oxygen.  He complains of worsening shortness of breath, LE edema with orthopnea.  Also had approximately 20 pounds weight gain since last visit with Dr. Darrick Meigs in December.  CTA shows bilateral pleural effusion and interstitial pulmonary edema.  This is consistent with an acute heart failure exacerbation.  BNP normal at 93 however can be falsely low with high BMI.  Echocardiogram in May show EF 60-65% with severe calcification of anterior MV leaflet.  Patient received 40 mg of IV Lasix in the ED.  Will monitor urine output.  Can give more IV Lasix in a.m. if have good diuresis with improvement of respiratory status.  Can consider obtaining a repeat echo given his symptoms of chest pain, and evidence of CAD on CTA.  Also suspicions for cardiac amyloidosis given finding of LVH on echocardiogram, conduction issue on EKG and worsening shortness of breath.  Will obtain LFT, if there is a protein gap, will order SPEP, IFE, QIG, Cardiac amyloid scan.  CTA also showed multiple enlarged mediastinal and bilateral hilar lymph nodes which could  suggest sarcoidosis.  Patient's calcium is WNL.  Patient has not had a PFT done in the past.  Could pursue this if rule out other diagnosis. Plan -Status post 40 mg IV Lasix -Strict I/O -Daily weights -Continue oxygen supplement, trend down if tolerable.  ABG unremarkable with low O2 level. -Pending LFT, if there is a protein gap, will order SPEP, IFE, QIG, Cardiac amyloid scan. -Can consider repeating echocardiogram -Albuterol as needed for wheezing   Chest pain His presentation of chest typical with onset at rest.  However also complained of chest pain with exertion and better with rest, associated with shortness of breath, which limits his ADLs.  His presentation is consistent with unstable angina.  Per cardiology notes, patient has a heart cath in 2018 which showed nonobstructive coronary arteries.  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 Troponins are flat which rules out ACS, no indication for urgent cath.  Given his stable condition, patient can follow-up with his cardiology outpatient for further diagnostic testing. -Patient has a follow-up appointment with his cardiologist on 13th January. -Resume atorvastatin 80 mg -Resume aspirin 81 mg   Hypertension Resume amlodipine and losartan   Second-degree AV block No indication for pacemaker per cardiology   Atrial fibrillation Started in 11/2018 when hospitalized for scrotal abscess.  Currently not in A. Fib. -Continue Eliquis   Type 2 diabetes Sliding scale insulin Monitor CBG   Depression -Fluoxetine  Code: Full DVT: Eliquis  Dispo: Admit patient to Inpatient with expected length of stay greater than 2 midnights.  SignedGaylan Gerold, DO 06/02/2020, 9:48 PM  Pager: [phone] After 5pm on weekdays and 1pm on weekends: On Call pager: [phone]

## 2020-06-02 NOTE — ED Triage Notes (Signed)
Pt here from home with c/ocheat pain and sob , sats were 84 % on room air , placed on 4 liters n/c by ems sats up to 94%

## 2020-06-02 NOTE — ED Provider Notes (Signed)
[facility] EMERGENCY DEPARTMENT Provider Note   CSN: [id number] Arrival date & time: 06/02/20  3818     History No chief complaint on file.   Allen Gould is a 55 y.o. male.  55 year old male with extensive past medical history including atrial fibrillation on Eliquis, hypertension, morbid obesity, type 2 diabetes mellitus, OSA on CPAP who presents with chest pain and shortness of breath.  Patient states that he has had a few days of constant, central chest pain that he describes as a squeezing pressure.  Pain is not related to exertion and nothing seems to make it worse.  He reports having shortness of breath for a long time, for the past year, but has been worse recently especially with exertion.  He endorses orthopnea and increased bilateral lower extremity edema recently.  He states he is compliant with medications and follows with Dr. Radford Pax with cardiology.  He reports mild cough recently but no fevers or sick contacts.  He is fully vaccinated against COVID-19.  The history is provided by the patient.       Past Medical History:  Diagnosis Date  . Acute kidney injury ([place]) 09/01/2019  . Asthma   . Balanitis 08/09/2014  . Bifascicular block   . Cough 08/09/2014  . Diabetes mellitus without complication ([place])   . DM (diabetes mellitus) type 2, uncontrolled, without ketoacidosis 08/10/2013  . Dyslipidemia 03/01/2014  . Financial difficulties 08/09/2014  . GERD (gastroesophageal reflux disease)   . Health care maintenance 03/01/2014  . Hypertension   . Hypertensive urgency 05/29/2017  . Hypomagnesemia 05/29/2017  . Insomnia 08/29/2013  . Morbid obesity ([place]) 08/29/2013  . Near syncope 05/29/2017  . Odontogenic infection of jaw 05/29/2017  . PAC (premature atrial contraction)   . PAF (paroxysmal atrial fibrillation) ([place])   . Pressure injury of skin 09/02/2019  . PVCs (premature ventricular contractions)   . Right shoulder pain 08/29/2013  . Second degree AV block,  Mobitz type I   . Sepsis ([place]) 05/29/2017  . Sleep apnea    uses cpap    Patient Active Problem List   Diagnosis Date Noted  . Severe depression ([place]) 04/24/2020  . Physical deconditioning 04/24/2020  . Cellulitis of scrotum 04/24/2020  . Weakness 10/10/2019  . Metabolic acidosis   . Second degree AV block, Mobitz type I   . Bifascicular block   . PAC (premature atrial contraction)   . PVCs (premature ventricular contractions)   . PAF (paroxysmal atrial fibrillation) ([place])   . Acute kidney injury ([place]) 09/01/2019  . Hypomagnesemia 05/29/2017  . Financial difficulties 08/09/2014  . Health care maintenance 03/01/2014  . Dyslipidemia 03/01/2014  . Morbid obesity ([place]) 08/29/2013  . Right shoulder pain 08/29/2013  . Insomnia 08/29/2013  . DM (diabetes mellitus) type 2, uncontrolled, without ketoacidosis 08/10/2013  . Hypertension   . Asthma   . GERD (gastroesophageal reflux disease)     Past Surgical History:  Procedure Laterality Date  . APPENDECTOMY         Family History  Problem Relation Age of Onset  . Kidney disease Mother   . Diabetes Sister   . Hyperlipidemia Sister   . Heart disease Other     Social History   Tobacco Use  . Smoking status: Former Smoker    Types: Cigarettes    Quit date: 04/24/2005    Years since quitting: 15.1  . Smokeless tobacco: Never Used  Vaping Use  . Vaping Use: Never used  Substance Use  Topics  . Alcohol use: Yes    Alcohol/week: 2.0 standard drinks    Types: 2 Cans of beer per week    Comment: every other day  . Drug use: No    Home Medications Prior to Admission medications   Medication Sig Start Date End Date Taking? Authorizing Provider  amLODipine (NORVASC) 10 MG tablet Take 1 tablet (10 mg total) by mouth daily. 04/24/20 04/19/21 Yes [name], [name], MD  apixaban (ELIQUIS) 5 MG TABS tablet Take 1 tablet (5 mg total) by mouth 2 (two) times daily. 04/02/20  Yes [name], MD  aspirin 81 MG EC tablet Take 81 mg  by mouth daily. Swallow whole.   Yes [provider]  atorvastatin (LIPITOR) 80 MG tablet Take 1 tablet (80 mg total) by mouth daily at 6 PM. Patient taking differently: Take 40 mg by mouth daily at 6 PM. 04/24/20  Yes [name], [name], MD  Ergocalciferol (VITAMIN D2 PO) Take 1,250 mg by mouth once a week.   Yes [provider]  famotidine (PEPCID) 10 MG tablet Take 1 tablet (10 mg total) by mouth 2 (two) times daily. 09/05/19  Yes Mosetta Anis, MD  FLUoxetine (PROZAC) 20 MG capsule Take 1 capsule (20 mg total) by mouth daily. 04/24/20  Yes [name], [name], MD  gabapentin (NEURONTIN) 300 MG capsule Take 1 capsule (300 mg total) by mouth in the morning, at noon, and at bedtime. 04/24/20 10/21/20 Yes [name], [name], MD  ibuprofen (ADVIL) 600 MG tablet Take 600 mg by mouth as needed for mild pain.   Yes [provider]  losartan (COZAAR) 50 MG tablet Take 1 tablet (50 mg total) by mouth daily. 04/24/20  Yes [name], [name], MD  metFORMIN (GLUCOPHAGE) 500 MG tablet Take 1.5 tablets (750 mg total) by mouth daily with breakfast. 04/24/20 08/22/20 Yes [name], [name], MD  vitamin B-12 (CYANOCOBALAMIN) 1000 MCG tablet Take 1,000 mcg by mouth daily.   Yes [provider]  Accu-Chek Softclix Lancets lancets Check blood sugar 3 times a day 04/25/20   Darrick Meigs, [name], MD  glucose blood (ACCU-CHEK GUIDE) test strip Check blood sugar 3 times a day 04/25/20   [name], MD    Allergies    Shellfish allergy  Review of Systems   Review of Systems All other systems reviewed and are negative except that which was mentioned in HPI  Physical Exam Updated Vital Signs BP 138/79   Pulse (!) 47   Temp 98.9 F (37.2 C) (Oral)   Resp 17   Wt (!) 143.3 kg   SpO2 95%   BMI 45.34 kg/m   Physical Exam Vitals and nursing note reviewed.  Constitutional:      General: He is not in acute distress.    Appearance: He is well-developed and well-nourished. He is obese.  HENT:      Head: Normocephalic and atraumatic.      Comments: Moist mucous membranesEyes:     Conjunctiva/sclera: Conjunctivae normal.  Cardiovascular:     Rate and Rhythm: Regular rhythm. Bradycardia present.     Heart sounds: Normal heart sounds. No murmur heard.   Pulmonary:     Comments: Mildly dyspneic but able to speak in full sentences, no distress; diminished BS b/l Abdominal:     General: There is no distension.     Palpations: Abdomen is soft.     Tenderness: There is no abdominal tenderness.  Musculoskeletal:     Cervical back: Neck supple.     Right lower  leg: Edema present.     Left lower leg: Edema present.  Skin:    General: Skin is warm and dry.  Neurological:     Mental Status: He is alert and oriented to person, place, and time.     Comments: Fluent speech  Psychiatric:        Mood and Affect: Mood and affect normal.        Judgment: Judgment normal.     ED Results / Procedures / Treatments   Labs (all labs ordered are listed, but only abnormal results are displayed) Labs Reviewed  BASIC METABOLIC PANEL - Abnormal; Notable for the following components:      Result Value   Sodium 133 (*)    CO2 20 (*)    Glucose, Bld 181 (*)    BUN 21 (*)    All other components within normal limits  CBC - Abnormal; Notable for the following components:   RBC 3.72 (*)    Hemoglobin 12.0 (*)    HCT 37.8 (*)    MCV 101.6 (*)    All other components within normal limits  TROPONIN I (HIGH SENSITIVITY) - Abnormal; Notable for the following components:   Troponin I (High Sensitivity) 24 (*)    All other components within normal limits  TROPONIN I (HIGH SENSITIVITY) - Abnormal; Notable for the following components:   Troponin I (High Sensitivity) 25 (*)    All other components within normal limits  RESP PANEL BY RT-PCR (FLU A&B, COVID) [id number]  BRAIN NATRIURETIC PEPTIDE    EKG EKG Interpretation  Date/Time:  Monday June 02 2020 13:31:16 EST Ventricular Rate:  48 PR  Interval:    QRS Duration: 149 QT Interval:  471 QTC Calculation: 421 R Axis:   -61 Text Interpretation: Junctional rhythm IVCD, consider atypical RBBB Inferior infarct, old Probable anterior infarct, age indeterminate No significant change since last tracing Confirmed by Theotis Burrow [phone]) on 06/02/2020 2:07:37 PM   Radiology DG Chest Port 1 View  Result Date: 06/02/2020 CLINICAL DATA:  Chest pain, shortness of breath. EXAM: PORTABLE CHEST 1 VIEW COMPARISON:  Oct 10, 2019. FINDINGS: Stable cardiomediastinal silhouette. No pneumothorax or pleural effusion is noted. Minimal bibasilar subsegmental atelectasis is noted. The visualized skeletal structures are unremarkable. IMPRESSION: Minimal bibasilar subsegmental atelectasis. Electronically Signed   By: Marijo Conception M.D.   On: 06/02/2020 13:11    Procedures Procedures (including critical care time)  Medications Ordered in ED Medications  furosemide (LASIX) injection 40 mg (has no administration in time range)    ED Course  I have reviewed the triage vital signs and the nursing notes.  Pertinent labs & imaging results that were available during my care of the patient were reviewed by me and considered in my medical decision making (see chart for details).    MDM Rules/Calculators/A&P                          Pt without distress on exam, requiring 2-3L O2 for hypoxia. On eliquis and compliant making PE unlikely. DDx includes CHF w/ volume overload, PNA, COVID-19.  Labs show normal Cr, reassuring CBC, trop 24-->25, COVID negative, CXR w/ atelectasis. BNP is pending and I am signing out to oncoming provider pending results and admission for new hypoxia.  Final Clinical Impression(s) / ED Diagnoses Final diagnoses:  None    Rx / DC Orders ED Discharge Orders    None       Little,  Wenda Overland, MD 06/02/20 [phone]

## 2020-06-02 NOTE — ED Notes (Signed)
Received report from [facility].

## 2020-06-02 NOTE — ED Notes (Signed)
Pt in CT.

## 2020-06-03 ENCOUNTER — Other Ambulatory Visit: Payer: Self-pay

## 2020-06-03 ENCOUNTER — Telehealth: Payer: Self-pay | Admitting: Cardiology

## 2020-06-03 DIAGNOSIS — J9601 Acute respiratory failure with hypoxia: Secondary | ICD-10-CM | POA: Diagnosis not present

## 2020-06-03 LAB — CBC
HCT: 37.1 % — ABNORMAL LOW (ref 39.0–52.0)
Hemoglobin: 12.1 g/dL — ABNORMAL LOW (ref 13.0–17.0)
MCH: 32.4 pg (ref 26.0–34.0)
MCHC: 32.6 g/dL (ref 30.0–36.0)
MCV: 99.2 fL (ref 80.0–100.0)
Platelets: 238 10*3/uL (ref 150–400)
RBC: 3.74 MIL/uL — ABNORMAL LOW (ref 4.22–5.81)
RDW: 13.4 % (ref 11.5–15.5)
WBC: 6.4 10*3/uL (ref 4.0–10.5)
nRBC: 0 % (ref 0.0–0.2)

## 2020-06-03 LAB — BASIC METABOLIC PANEL
Anion gap: 13 (ref 5–15)
BUN: 14 mg/dL (ref 6–20)
CO2: 22 mmol/L (ref 22–32)
Calcium: 9.5 mg/dL (ref 8.9–10.3)
Chloride: 98 mmol/L (ref 98–111)
Creatinine, Ser: 0.98 mg/dL (ref 0.61–1.24)
GFR, Estimated: >60 mL/min (ref >60)
Glucose, Bld: 142 mg/dL — ABNORMAL HIGH (ref 70–99)
Potassium: 3.7 mmol/L (ref 3.5–5.1)
Sodium: 133 mmol/L — ABNORMAL LOW (ref 135–145)

## 2020-06-03 LAB — GLUCOSE, CAPILLARY
Glucose-Capillary: 176 mg/dL — ABNORMAL HIGH (ref 70–99)
Glucose-Capillary: 142 mg/dL — ABNORMAL HIGH (ref 70–99)
Glucose-Capillary: 153 mg/dL — ABNORMAL HIGH (ref 70–99)
Glucose-Capillary: 143 mg/dL — ABNORMAL HIGH (ref 70–99)

## 2020-06-03 MED ORDER — FUROSEMIDE 10 MG/ML IJ SOLN
40.0000 mg | Freq: Once | INTRAMUSCULAR | Status: AC
  Administered 2020-06-03: 40 mg via INTRAVENOUS
  Filled 2020-06-03: qty 4

## 2020-06-03 MED ORDER — ATORVASTATIN CALCIUM 40 MG PO TABS: 40.0000 mg | ORAL_TABLET | Freq: Every day | ORAL | Status: DC

## 2020-06-03 MED ORDER — ASPIRIN 81 MG PO CHEW
81.0000 mg | CHEWABLE_TABLET | Freq: Every day | ORAL | Status: DC
  Administered 2020-06-03 – 2020-06-04 (×2): 81 mg via ORAL
  Filled 2020-06-03 (×2): qty 1

## 2020-06-03 NOTE — Progress Notes (Signed)
HD#1 Subjective:   Overnight Events: Admitted yesterday evening  During evaluation this morning, patient states he is feeling better this morning in terms of his breathing, and he is not having chest pain. Reports he has been having intermittent symptoms like these over the last few months. Reports his cardiologist told him he has PVCs. Does not think he is on a diuretic at home.   States his current weight is ~326. He does not weigh himself daily.  Objective:   Vital signs in last 24 hours: Vitals:   06/02/20 2200 06/03/20 0106 06/03/20 0354 06/03/20 0812  BP: 136/77 (!) 142/93 (!) 142/89 (!) 148/90  Pulse: (!) 29 75 79 77  Resp: 17 15 20 18   Temp:   98.4 F (36.9 C) 98.4 F (36.9 C)  TempSrc:   Oral Oral  SpO2: 95% 96% 100% 99%  Weight:   (!) 148.2 kg   Height:   5' 10"  (1.778 m)    Supplemental O2: Nasal Cannula SpO2: 99 % O2 Flow Rate (L/min): 3 L/min FiO2 (%): 100 %  Physical Exam Constitutional: well-appearing sitting in chair, in no acute distress HENT: normocephalic atraumatic Eyes: conjunctiva non-erythematous Neck: supple. JVD to the mid neck.  Cardiovascular: regular rate and rhythm, 2/6 systolic murmur present Pulmonary/Chest: Supplemental oxygen via [place]. CTA Abdominal: soft, non-tender, non-distended MSK: normal bulk and tone Neurological: alert & oriented x 3  Skin: +1 lower extremity edema Psych: Normal mood and affect  Filed Weights   06/02/20 0956 06/02/20 1602 06/03/20 0354  Weight: (!) 143.3 kg (!) 153.5 kg (!) 148.2 kg     Intake/Output Summary (Last 24 hours) at 06/03/2020 0943 Last data filed at 06/03/2020 0811 Gross per 24 hour  Intake 580 ml  Output 2095 ml  Net -1515 ml   Net IO Since Admission: -1,515 mL [06/03/20 0943]  Pertinent Labs: CBC Latest Ref Rng & Units 06/03/2020 06/02/2020 06/02/2020  WBC 4.0 - 10.5 K/uL 6.4 - 8.5  Hemoglobin 13.0 - 17.0 g/dL 12.1(L) 11.9(L) 12.0(L)  Hematocrit 39.0 - 52.0 % 37.1(L) 35.0(L) 37.8(L)   Platelets 150 - 400 K/uL 238 - 238    CMP Latest Ref Rng & Units 06/03/2020 06/02/2020 06/02/2020  Glucose 70 - 99 mg/dL 142(H) - -  BUN 6 - 20 mg/dL 14 - -  Creatinine 0.61 - 1.24 mg/dL 0.98 - -  Sodium 135 - 145 mmol/L 133(L) - 137  Potassium 3.5 - 5.1 mmol/L 3.7 - 3.9  Chloride 98 - 111 mmol/L 98 - -  CO2 22 - 32 mmol/L 22 - -  Calcium 8.9 - 10.3 mg/dL 9.5 - -  Total Protein 6.5 - 8.1 g/dL - 7.5 -  Total Bilirubin 0.3 - 1.2 mg/dL - 0.3 -  Alkaline Phos 38 - 126 U/L - 66 -  AST 15 - 41 U/L - 13(L) -  ALT 0 - 44 U/L - 16 -    Imaging: CT Angio Chest PE W and/or Wo Contrast  Result Date: 06/02/2020 CLINICAL DATA:  55 year old male with history of unexplained chest pain and shortness of breath. Evaluate for potential pulmonary embolism. EXAM: CT ANGIOGRAPHY CHEST WITH CONTRAST TECHNIQUE: Multidetector CT imaging of the chest was performed using the standard protocol during bolus administration of intravenous contrast. Multiplanar CT image reconstructions and MIPs were obtained to evaluate the vascular anatomy. CONTRAST:  72m OMNIPAQUE IOHEXOL 350 MG/ML SOLN COMPARISON:  No priors. FINDINGS: Cardiovascular: No filling defects within the pulmonary arterial tree to suggest underlying pulmonary embolism.  Heart size is borderline enlarged. There is no significant pericardial fluid, thickening or pericardial calcification. There is aortic atherosclerosis, as well as atherosclerosis of the great vessels of the mediastinum and the coronary arteries, including calcified atherosclerotic plaque in the left anterior descending, left circumflex and right coronary arteries. Calcifications of the aortic valve, mitral valve and mitral annulus. Mediastinum/Nodes: Multiple borderline enlarged and mildly enlarged mediastinal and bilateral hilar lumen lymph nodes (right greater than left), largest of which measures up to 2.2 cm in short axis in the right hilar nodal station (axial image 93 of series 7). Esophagus  is unremarkable in appearance. No axillary lymphadenopathy. Lungs/Pleura: Patchy multifocal areas of ground-glass attenuation and interlobular septal thickening throughout the lungs bilaterally, most compatible with interstitial pulmonary edema. Small bilateral pleural effusions lying dependently with some associated passive subsegmental atelectasis in the lower lobes of the lungs bilaterally. No definite suspicious appearing pulmonary nodules or masses are noted. Upper Abdomen: Aortic atherosclerosis. Diffuse low attenuation throughout the visualized hepatic parenchyma, indicative of severe hepatic steatosis. Musculoskeletal: There are no aggressive appearing lytic or blastic lesions noted in the visualized portions of the skeleton. Review of the MIP images confirms the above findings. IMPRESSION: 1. No evidence of pulmonary embolism. 2. The appearance of the lungs suggests interstitial pulmonary edema. There is also a small bilateral pleural effusions. However, the heart does not appear dilated. Clinical correlation for signs and symptoms of diastolic heart failure is suggested. 3. Aortic atherosclerosis, in addition to 3 vessel coronary artery disease. Please note that although the presence of coronary artery calcium documents the presence of coronary artery disease, the severity of this disease and any potential stenosis cannot be assessed on this non-gated CT examination. Assessment for potential risk factor modification, dietary therapy or pharmacologic therapy may be warranted, if clinically indicated. 4. There are calcifications of the aortic valve and mitral annulus/valve. Echocardiographic correlation for evaluation of potential valvular dysfunction may be warranted if clinically indicated. 5. Multiple prominent borderline enlarged and mildly enlarged mediastinal and bilateral hilar lymph nodes are noted (right greater than left). Clinical correlation for systemic disease such as sarcoidosis or  lymphoproliferative disorder is recommended. 6. Hepatic steatosis. Electronically Signed   By: Vinnie Langton M.D.   On: 06/02/2020 18:22   DG Chest Port 1 View  Result Date: 06/02/2020 CLINICAL DATA:  Chest pain, shortness of breath. EXAM: PORTABLE CHEST 1 VIEW COMPARISON:  Oct 10, 2019. FINDINGS: Stable cardiomediastinal silhouette. No pneumothorax or pleural effusion is noted. Minimal bibasilar subsegmental atelectasis is noted. The visualized skeletal structures are unremarkable. IMPRESSION: Minimal bibasilar subsegmental atelectasis. Electronically Signed   By: Marijo Conception M.D.   On: 06/02/2020 13:11    Assessment/Plan:   Principal Problem:   Acute respiratory failure with hypoxia and hypoxemia (HCC) Active Problems:   DM (diabetes mellitus) type 2, uncontrolled, without ketoacidosis   Hypertension   Second degree AV block, Mobitz type I   PAF (paroxysmal atrial fibrillation) [facility])   Patient Summary: 55 year old male with past medical history of A. fib on Eliquis, hypertension, type 2 diabetes, OSA, second-degree AV block type I, who presents the ED for 3 days of chest pain and worsening shortness of breath for further evaluation of suspected CHF exacerbation  Acute Hypoxic Respiratory Failure 2/2 Acute CHF Exacerbation Diuresing well overnight, with I/O of - 1.8 L. Shortness of breath improving, denies episodes of chest pain. Suspect etiology is associated with patient's conduction disease. He has an appointmetn with EP in two days. If patient  continues to improve, plan for discharge and for him to follow up with EP.  -continue lasix 40 mg daily -daily I/O, strict weights -recent echo 10/11/19, do not see indication to repeat at this time  Second-degree AV block Patient followed by Dr. Radford Pax of cardiology. No indication for pacemaker placement in the past. Rhythm upon initial evaluation was junctional rhythm.  -F/U with EP in two days -Avoid AV nodal blocking  agents  Atrial fibrillation Started in 11/2018 when hospitalized for scrotal abscess. No episodes of AFIB since hospitalization -Continue Eliquis   Diet: Heart Healthy IVF: None,None VTE: Eliquis Code: Full PT/OT recs: None  Dispo: Anticipated discharge to Home in 1-2 days pending further evaluation of heart failure  Please contact the on call pager after 5 pm and on weekends at [phone].  Sanjuana Letters, DO PGY-1 Internal Medicine Teaching Service Pager: [phone] 06/03/2020

## 2020-06-03 NOTE — Progress Notes (Signed)
CCMD notified RN of patient experiencing 2nd degree type 2 heart block.  MD notified. 12-lead EKG performed and in chart. Patient asymptomatic. Will continue to monitor.

## 2020-06-03 NOTE — Progress Notes (Signed)
declines to have a password, all calls must go through him, per pt

## 2020-06-03 NOTE — Telephone Encounter (Signed)
New Message:      Pt says he is in the hospital and would like for Dr Radford Pax or her nurse to give him a call please.

## 2020-06-03 NOTE — Telephone Encounter (Signed)
Spoke with the patient who is currently in the hospital. He states that they may be discharging him tomorrow but he is unsure. He has a consult with Dr. Quentin Ore scheduled for Thursday 1/13. I advised him that I would let Dr. Mardene Speak scheduler and nurse know so that they could reschedule him if needed.

## 2020-06-03 NOTE — Plan of Care (Signed)
  Problem: Education: Goal: Knowledge of General Education information will improve Description Including pain rating scale, medication(s)/side effects and non-pharmacologic comfort measures Outcome: Progressing   

## 2020-06-04 ENCOUNTER — Telehealth: Payer: Self-pay

## 2020-06-04 LAB — GLUCOSE, CAPILLARY
Glucose-Capillary: 134 mg/dL — ABNORMAL HIGH (ref 70–99)
Glucose-Capillary: 165 mg/dL — ABNORMAL HIGH (ref 70–99)
Glucose-Capillary: 132 mg/dL — ABNORMAL HIGH (ref 70–99)

## 2020-06-04 LAB — CBC
HCT: 37 % — ABNORMAL LOW (ref 39.0–52.0)
Hemoglobin: 12.4 g/dL — ABNORMAL LOW (ref 13.0–17.0)
MCH: 33.2 pg (ref 26.0–34.0)
MCHC: 33.5 g/dL (ref 30.0–36.0)
MCV: 98.9 fL (ref 80.0–100.0)
Platelets: 250 10*3/uL (ref 150–400)
RBC: 3.74 MIL/uL — ABNORMAL LOW (ref 4.22–5.81)
RDW: 13.2 % (ref 11.5–15.5)
WBC: 6.5 10*3/uL (ref 4.0–10.5)
nRBC: 0 % (ref 0.0–0.2)

## 2020-06-04 LAB — BASIC METABOLIC PANEL
Anion gap: 14 (ref 5–15)
BUN: 18 mg/dL (ref 6–20)
CO2: 22 mmol/L (ref 22–32)
Calcium: 9.6 mg/dL (ref 8.9–10.3)
Chloride: 96 mmol/L — ABNORMAL LOW (ref 98–111)
Creatinine, Ser: 1.05 mg/dL (ref 0.61–1.24)
GFR, Estimated: >60 mL/min (ref >60)
Glucose, Bld: 128 mg/dL — ABNORMAL HIGH (ref 70–99)
Potassium: 4 mmol/L (ref 3.5–5.1)
Sodium: 132 mmol/L — ABNORMAL LOW (ref 135–145)

## 2020-06-04 MED ORDER — IPRATROPIUM-ALBUTEROL 0.5-2.5 (3) MG/3ML IN SOLN
3.0000 mL | Freq: Once | RESPIRATORY_TRACT | Status: AC
  Administered 2020-06-04: 3 mL via RESPIRATORY_TRACT
  Filled 2020-06-04: qty 3

## 2020-06-04 MED ORDER — FUROSEMIDE 10 MG/ML IJ SOLN
40.0000 mg | Freq: Once | INTRAMUSCULAR | Status: AC
  Administered 2020-06-04: 40 mg via INTRAVENOUS
  Filled 2020-06-04: qty 4

## 2020-06-04 MED ORDER — INSULIN ASPART 100 UNIT/ML ~~LOC~~ SOLN
0.0000 [IU] | Freq: Three times a day (TID) | SUBCUTANEOUS | Status: DC
  Administered 2020-06-04: 2 [IU] via SUBCUTANEOUS
  Administered 2020-06-04: 3 [IU] via SUBCUTANEOUS

## 2020-06-04 NOTE — Progress Notes (Signed)
SATURATION QUALIFICATIONS: (This note is used to comply with regulatory documentation for home oxygen)  Patient Saturations on Room Air at Rest = 95%  Patient Saturations on Room Air while Ambulating = 93%   

## 2020-06-04 NOTE — Plan of Care (Signed)

## 2020-06-04 NOTE — Progress Notes (Signed)
D/C instructions given and reviewed. No questions asked at this time but encouraged to call for any concerns. Tele and IV removed, tolerated well.

## 2020-06-04 NOTE — Discharge Instructions (Signed)
Thank you for allowing [place] to care for you today. You were admitted for heart failure exacerbation. You initially required oxygen but improved with diuretics. Please follow up with your cardiologist, primary care doctor, and new electrophysiologist (EP cardiologist)

## 2020-06-04 NOTE — Progress Notes (Signed)
HD#2 Subjective:   Overnight Events: Yesterday afternoon CCMD informed nursing staff patient having frequent episodes of bradycardia as well as two episodes of mobitz type II. EKG performed at this time revealing Mobitz type I. LAD. RBBB.   During evaluation this morning, patient states he is tired. Reporting some chest pain and shortness of breath which started when he laid himself back in bed. States he has not been sleeping well. When head of bed raised up, he appears more comfortable. Discussed plan of discharge home today with plan to see EP physician tomorrow. Patient states, "I just want to feel better."   Reports he found out that his niece who lives with him has tested positive for COVID. Last saw her 06/02/20, the day of his admission. Received two doses of Moderna but no booster.   Objective:   Vital signs in last 24 hours: Vitals:   06/03/20 1928 06/03/20 2100 06/03/20 2117 06/04/20 0434  BP:   134/73 (!) 136/94  Pulse:   (!) 51 71  Resp: 13 20 20 20   Temp:   98.7 F (37.1 C) 98.4 F (36.9 C)  TempSrc:   Oral Oral  SpO2:   100% 100%  Weight:    (!) 145.6 kg  Height:       Supplemental O2: Nasal Cannula SpO2: 100 % O2 Flow Rate (L/min): 4 L/min FiO2 (%): 100 %  Physical Exam Constitutional: well-appearing sitting in chair, in no acute distress HENT: normocephalic atraumatic Eyes: conjunctiva non-erythematous Neck: supple. JVD improved Cardiovascular: regular rate and rhythm, 2/6 systolic murmur present Pulmonary/Chest: Supplemental oxygen via [place]. Diffuse wheezing Abdominal: soft, non-tender, non-distended MSK: normal bulk and tone Neurological: alert & oriented x 3  Skin: +1 lower extremity edema Psych: tired appearing, flat affect.   Filed Weights   06/02/20 1602 06/03/20 0354 06/04/20 0434  Weight: (!) 153.5 kg (!) 148.2 kg (!) 145.6 kg     Intake/Output Summary (Last 24 hours) at 06/04/2020 0625 Last data filed at 06/04/2020 0600 Gross per 24 hour   Intake 1440 ml  Output 3875 ml  Net -2435 ml   Net IO Since Admission: -3,810 mL [06/04/20 0625]  Pertinent Labs: CBC Latest Ref Rng & Units 06/04/2020 06/03/2020 06/02/2020  WBC 4.0 - 10.5 K/uL 6.5 6.4 -  Hemoglobin 13.0 - 17.0 g/dL 12.4(L) 12.1(L) 11.9(L)  Hematocrit 39.0 - 52.0 % 37.0(L) 37.1(L) 35.0(L)  Platelets 150 - 400 K/uL 250 238 -    CMP Latest Ref Rng & Units 06/04/2020 06/03/2020 06/02/2020  Glucose 70 - 99 mg/dL 128(H) 142(H) -  BUN 6 - 20 mg/dL 18 14 -  Creatinine 0.61 - 1.24 mg/dL 1.05 0.98 -  Sodium 135 - 145 mmol/L 132(L) 133(L) -  Potassium 3.5 - 5.1 mmol/L 4.0 3.7 -  Chloride 98 - 111 mmol/L 96(L) 98 -  CO2 22 - 32 mmol/L 22 22 -  Calcium 8.9 - 10.3 mg/dL 9.6 9.5 -  Total Protein 6.5 - 8.1 g/dL - - 7.5  Total Bilirubin 0.3 - 1.2 mg/dL - - 0.3  Alkaline Phos 38 - 126 U/L - - 66  AST 15 - 41 U/L - - 13(L)  ALT 0 - 44 U/L - - 16    Imaging: No results found.  Assessment/Plan:   Principal Problem:   Acute respiratory failure with hypoxia and hypoxemia (HCC) Active Problems:   DM (diabetes mellitus) type 2, uncontrolled, without ketoacidosis   Hypertension   Second degree AV block, Mobitz type I  PAF (paroxysmal atrial fibrillation) [facility])   Patient Summary: 55 year old male with past medical history of A. fib on Eliquis, hypertension, type 2 diabetes, OSA, second-degree AV block type I, who presents the ED for 3 days of chest pain and worsening shortness of breath for further evaluation of suspected CHF exacerbation.  Acute Hypoxic Respiratory Failure 2/2 Acute CHF Exacerbation Diuresing well. No longer requiring supplemental oxygen.  Plan to give additional dose of 40 mg lasix prior to discharge.   -attend EP appointment tomorrow -F/U appointment scheduled to see Dr. Radford Pax Jan 26th 09:15 -40 mg lasix prior to discharge  #Wheezing Patient with diffuse wheezing on examination. Will order breathing treatment prior to discharge. Will reassess this  afternoon  Second-degree AV block Patient followed by Dr. Radford Pax of cardiology. No indication for pacemaker placement in the past. Rhythm upon initial evaluation was junctional rhythm. Telemetry with episodes of second degree AV block type I and bradycardia. Patient endorsed weakness, but he notes this has been occurring for the past few months. Follow up with cardiologist as per scheduling above.  -F/U with EP in two days -Avoid AV nodal blocking agents  Atrial fibrillation Started in 11/2018 when hospitalized for scrotal abscess. No episodes of AFIB since hospitalization -Continue Eliquis  Diet: Heart Healthy IVF: None,None VTE: Eliquis Code: Full PT/OT recs: None  Dispo: Anticipated discharge to Home in 1-2 days pending further evaluation of heart failure  Please contact the on call pager after 5 pm and on weekends at [phone].  Sanjuana Letters, DO PGY-1 Internal Medicine Teaching Service Pager: [phone] 06/04/2020

## 2020-06-04 NOTE — Telephone Encounter (Signed)
Hospital TOC per patient, discharge 06/04/2020, appt 06/11/2020.

## 2020-06-05 ENCOUNTER — Ambulatory Visit: Payer: Medicaid Other | Admitting: Cardiology

## 2020-06-05 ENCOUNTER — Other Ambulatory Visit: Payer: Self-pay

## 2020-06-05 ENCOUNTER — Encounter: Payer: Self-pay | Admitting: Cardiology

## 2020-06-05 VITALS — BP 122/80 | HR 67 | Ht 70.0 in | Wt 323.2 lb

## 2020-06-05 DIAGNOSIS — I495 Sick sinus syndrome: Secondary | ICD-10-CM | POA: Diagnosis not present

## 2020-06-05 DIAGNOSIS — I4510 Unspecified right bundle-branch block: Secondary | ICD-10-CM

## 2020-06-05 DIAGNOSIS — I440 Atrioventricular block, first degree: Secondary | ICD-10-CM

## 2020-06-05 DIAGNOSIS — I498 Other specified cardiac arrhythmias: Secondary | ICD-10-CM | POA: Diagnosis not present

## 2020-06-05 DIAGNOSIS — I480 Paroxysmal atrial fibrillation: Secondary | ICD-10-CM

## 2020-06-05 DIAGNOSIS — I444 Left anterior fascicular block: Secondary | ICD-10-CM

## 2020-06-05 DIAGNOSIS — I453 Trifascicular block: Secondary | ICD-10-CM | POA: Diagnosis not present

## 2020-06-05 NOTE — Progress Notes (Deleted)
CARDIOLOGY OFFICE NOTE  Date:  06/05/2020    Allen Gould Date of Birth: 04/04/1966 Medical Record #[id number]  PCP:  [name], MD  Cardiologist:  Gweneth Dimitri    No chief complaint on file.   History of Present Illness: Allen Gould is a 55 y.o. male who presents today for a follow up visit. Seen for Dr. Radford Pax - he also sees Dr. Quentin Ore for EP.   He has a known history of CAD with mild to moderate non obstructive disease by cath at [facility] in 2018, PAF, CHADSVASC of 3, chronic anticoagulation with Eliquis & Xarelto, RBBB, LAFB, Mobitz 1 and 1st degree AV block, ongoing alcohol abuse, morbid obesity, DM, HTN and HLD.   Admitted with scrotal absceess in 2020 at [facility] - was in AF with RVR at that time - apparently has had his anticoagulation stopped due to alcohol abuse and non adherence.   Last seen by Dr. Radford Pax this past November. Noted on prior OVs non adherence with his anticoagulation. He has had 2nd degree AV block - type I. BP has been poorly controlled. He was previously in a AL facility but had returned home. Apparently had stopped drinking - Eliquis was resumed.   Recent ER visit for chest pain and cough - fully vaccinated for COVID 19. He had a junctional rhythm at that time. Was referred to EP - saw Dr. Quentin Ore earlier this month - patient endorsed lots of dizziness. No syncope. Abnormal monitor noted - was referred for PPM implant.   Comes in today. Here with   Past Medical History:  Diagnosis Date  . Acute kidney injury ([place]) 09/01/2019  . Asthma   . Balanitis 08/09/2014  . Bifascicular block   . Cough 08/09/2014  . Diabetes mellitus without complication ([place])   . DM (diabetes mellitus) type 2, uncontrolled, without ketoacidosis 08/10/2013  . Dyslipidemia 03/01/2014  . Financial difficulties 08/09/2014  . GERD (gastroesophageal reflux disease)   . Health care maintenance 03/01/2014  . Hypertension   . Hypertensive urgency 05/29/2017  . Hypomagnesemia  05/29/2017  . Insomnia 08/29/2013  . Morbid obesity ([place]) 08/29/2013  . Near syncope 05/29/2017  . Odontogenic infection of jaw 05/29/2017  . PAC (premature atrial contraction)   . PAF (paroxysmal atrial fibrillation) ([place])   . Pressure injury of skin 09/02/2019  . PVCs (premature ventricular contractions)   . Right shoulder pain 08/29/2013  . Second degree AV block, Mobitz type I   . Sepsis ([place]) 05/29/2017  . Sleep apnea    uses cpap    Past Surgical History:  Procedure Laterality Date  . APPENDECTOMY       Medications: No outpatient medications have been marked as taking for the 06/18/20 encounter (Appointment) with Burtis Junes, NP.     Allergies: Allergies  Allergen Reactions  . Shellfish Allergy Hives    Social History: The patient  reports that he quit smoking about 15 years ago. His smoking use included cigarettes. He has never used smokeless tobacco. He reports current alcohol use of about 2.0 standard drinks of alcohol per week. He reports that he does not use drugs.   Family History: The patient's ***family history includes Diabetes in his sister; Heart disease in an other family member; Hyperlipidemia in his sister; Kidney disease in his mother.   Review of Systems: Please see the history of present illness.   All other systems are reviewed and negative.   Physical Exam: VS:  There were  no vitals taken for this visit. [name]  BMI There is no height or weight on file to calculate BMI.  Wt Readings from Last 3 Encounters:  06/04/20 (!) 321 lb (145.6 kg)  04/24/20 (!) 316 lb 1.6 oz (143.4 kg)  04/02/20 (!) 314 lb 9.6 oz (142.7 kg)    General: Pleasant. Well developed, well nourished and in no acute distress.   HEENT: Normal.  Neck: Supple, no JVD, carotid bruits, or masses noted.  Cardiac: ***Regular rate and rhythm. No murmurs, rubs, or gallops. No edema.  Respiratory:  Lungs are clear to auscultation bilaterally with normal work of breathing.  GI: Soft and nontender.   MS: No deformity or atrophy. Gait and ROM intact.  Skin: Warm and dry. Color is normal.  Neuro:  Strength and sensation are intact and no gross focal deficits noted.  Psych: Alert, appropriate and with normal affect.   LABORATORY DATA:  EKG:  EKG {ACTION; IS/IS WUX:32440102} ordered today.  Personally reviewed by me. This demonstrates ***.  Lab Results  Component Value Date   WBC 6.5 06/04/2020   HGB 12.4 (L) 06/04/2020   HCT 37.0 (L) 06/04/2020   PLT 250 06/04/2020   GLUCOSE 128 (H) 06/04/2020   CHOL 92 (L) 04/08/2020   TRIG 87 04/08/2020   HDL 40 04/08/2020   LDLDIRECT 108 (H) 03/01/2014   LDLCALC 35 04/08/2020   ALT 16 06/02/2020   AST 13 (L) 06/02/2020   NA 132 (L) 06/04/2020   K 4.0 06/04/2020   CL 96 (L) 06/04/2020   CREATININE 1.05 06/04/2020   BUN 18 06/04/2020   CO2 22 06/04/2020   TSH 2.150 10/10/2019   INR 1.1 10/10/2019   HGBA1C 5.7 (H) 09/02/2019   MICROALBUR 5.4 (H) 02/28/2014     BNP (last 3 results) Recent Labs    06/02/20 1007  BNP 93.9    ProBNP (last 3 results) No results for input(s): PROBNP in the last 8760 hours.    Other Studies Reviewed Today:  May 07, 2020 ZIO  There were many episodes of high degree AV block noted during the ZIO monitoring.  The longest episode of high degree AV block lasted almost 6 hours.  Episodes of AV block occurred during awake and sleep hours.  There were also episodes of SVT lasting seconds at a time.     June 02, 2020 EKG shows junctional rhythm    June 03, 2020 EKG shows first-degree AV delay, right bundle branch block and a left anterior fascicular block    Echocardiogram 10/11/2019 EF 60-65, no RWMA, moderate LVH, normal RVSF, mild LAE, severe calcification of anterior mitral valve leaflet, trivial MR, mild MS, mild AS (mean gradient 12 mmHg), borderline dilatation of aortic root (39 mm)     Cardiac catheterization 10/27/16 ([facility]) Report states:  "mild to mod non-obstructive  dz"      ASSESSMENT & PLAN:     1. Symptomatic paroxysmal high degree AV block and Tachy-brady syndrome - s/p PPM implant  2. Known CAD - prior cath in 2018 showing non obstructive disease  3. HTN  4. HLD  5. PAF  6. Chronic anticoagulation  7. Alcohol abuse     Current medicines are reviewed with the patient today.  The patient does not have concerns regarding medicines other than what has been noted above.  The following changes have been made:  See above.  Labs/ tests ordered today include:   No orders of the defined types were placed in this encounter.  Disposition:   FU with *** in {gen number 5-84:835075} {Days to years:10300}.   Patient is agreeable to this plan and will call if any problems develop in the interim.   SignedTruitt Merle, NP  06/05/2020 7:37 AM  [facility] [address] [place], [place]  [address] Phone: [phone] Fax: [phone]

## 2020-06-05 NOTE — H&P (View-Only) (Signed)
Electrophysiology Office Note:    Date:  06/05/2020   ID:  Allen Gould, DOB 12/05/1965, MRN [id number]  PCP:  [name], [name], MD  [facility] Cardiologist:  Traci Turner, MD  [facility] Electrophysiologist:  Fynlee Rowlands T Torre Pikus, MD  Referring MD: Turner, Traci R, MD   Chief Complaint: Tachycardia-bradycardia syndrome  History of Present Illness:    Allen Gould is a 55 y.o. male who presents for an evaluation of tachycardia-bradycardia, atrial fibrillation, intermittent high degree AV block at the request of Dr. Turner. Their medical history includes morbid obesity, sleep apnea, hypertension, diabetes.  He was just released from the hospital after presenting January 10 for several days of worsening shortness of breath and chest pain.  He reported dyspnea on exertion.  He had significant edema on exam.  Interestingly, when he presented to the emergency department he was found to be in a junctional rhythm.  During our visit today he tells me he has had multiple episodes where he has felt lightheaded and dizzy at home and checked his pulse and found it to be very low.  Past Medical History:  Diagnosis Date  . Acute kidney injury (HCC) 09/01/2019  . Asthma   . Balanitis 08/09/2014  . Bifascicular block   . Cough 08/09/2014  . Diabetes mellitus without complication (HCC)   . DM (diabetes mellitus) type 2, uncontrolled, without ketoacidosis 08/10/2013  . Dyslipidemia 03/01/2014  . Financial difficulties 08/09/2014  . GERD (gastroesophageal reflux disease)   . Health care maintenance 03/01/2014  . Hypertension   . Hypertensive urgency 05/29/2017  . Hypomagnesemia 05/29/2017  . Insomnia 08/29/2013  . Morbid obesity (HCC) 08/29/2013  . Near syncope 05/29/2017  . Odontogenic infection of jaw 05/29/2017  . PAC (premature atrial contraction)   . PAF (paroxysmal atrial fibrillation) (HCC)   . Pressure injury of skin 09/02/2019  . PVCs (premature ventricular contractions)   . Right shoulder pain  08/29/2013  . Second degree AV block, Mobitz type I   . Sepsis (HCC) 05/29/2017  . Sleep apnea    uses cpap    Past Surgical History:  Procedure Laterality Date  . APPENDECTOMY      Current Medications: Current Meds  Medication Sig  . Accu-Chek Softclix Lancets lancets Check blood sugar 3 times a day  . amLODipine (NORVASC) 10 MG tablet Take 1 tablet (10 mg total) by mouth daily.  . apixaban (ELIQUIS) 5 MG TABS tablet Take 1 tablet (5 mg total) by mouth 2 (two) times daily.  . aspirin 81 MG EC tablet Take 81 mg by mouth daily. Swallow whole.  . atorvastatin (LIPITOR) 40 MG tablet Take 40 mg by mouth daily.  . famotidine (PEPCID) 10 MG tablet Take 1 tablet (10 mg total) by mouth 2 (two) times daily.  . FLUoxetine (PROZAC) 20 MG capsule Take 1 capsule (20 mg total) by mouth daily.  . gabapentin (NEURONTIN) 300 MG capsule Take 1 capsule (300 mg total) by mouth in the morning, at noon, and at bedtime.  . glucose blood (ACCU-CHEK GUIDE) test strip Check blood sugar 3 times a day  . ibuprofen (ADVIL) 600 MG tablet Take 600 mg by mouth as needed for mild pain.  . losartan (COZAAR) 50 MG tablet Take 1 tablet (50 mg total) by mouth daily.  . metFORMIN (GLUCOPHAGE) 500 MG tablet Take 1.5 tablets (750 mg total) by mouth daily with breakfast.  . vitamin B-12 (CYANOCOBALAMIN) 1000 MCG tablet Take 1,000 mcg by mouth daily.       Allergies:   Shellfish allergy   Social History   Socioeconomic History  . Marital status: Single    Spouse name: Not on file  . Number of children: Not on file  . Years of education: Not on file  . Highest education level: Not on file  Occupational History  . Not on file  Tobacco Use  . Smoking status: Former Smoker    Types: Cigarettes    Quit date: 04/24/2005    Years since quitting: 15.1  . Smokeless tobacco: Never Used  Vaping Use  . Vaping Use: Never used  Substance and Sexual Activity  . Alcohol use: Yes    Alcohol/week: 2.0 standard drinks    Types: 2  Cans of beer per week    Comment: every other day  . Drug use: No  . Sexual activity: Not on file  Other Topics Concern  . Not on file  Social History Narrative  . Not on file   Social Determinants of Health   Financial Resource Strain: Not on file  Food Insecurity: Not on file  Transportation Needs: Not on file  Physical Activity: Not on file  Stress: Not on file  Social Connections: Not on file     Family History: The patient's family history includes Diabetes in his sister; Heart disease in an other family member; Hyperlipidemia in his sister; Kidney disease in his mother.  ROS:   Please see the history of present illness.    All other systems reviewed and are negative.  EKGs/Labs/Other Studies Reviewed:    The following studies were reviewed today:  May 07, 2020 ZIO personally reviewed There were many episodes of high degree AV block noted during the ZIO monitoring.  The longest episode of high degree AV block lasted almost 6 hours.  Episodes of AV block occurred during awake and sleep hours.  There were also episodes of SVT lasting seconds at a time.     June 02, 2020 EKG personally reviewed shows junctional rhythm    June 03, 2020 EKG personally reviewed shows first-degree AV delay, right bundle branch block and a left anterior fascicular block   Oct 11, 2019 echo personally reviewed Left ventricular function normal Left ventricular hypertrophy  Recent Labs: 10/10/2019: TSH 2.150 10/11/2019: Magnesium 1.7 06/02/2020: ALT 16; B Natriuretic Peptide 93.9 06/04/2020: BUN 18; Creatinine, Ser 1.05; Hemoglobin 12.4; Platelets 250; Potassium 4.0; Sodium 132  Recent Lipid Panel    Component Value Date/Time   CHOL 92 (L) 04/08/2020 0817   TRIG 87 04/08/2020 0817   HDL 40 04/08/2020 0817   CHOLHDL 2.3 04/08/2020 0817   CHOLHDL 6.0 02/28/2014 1508   VLDL NOT CALC 02/28/2014 1508   LDLCALC 35 04/08/2020 0817   LDLDIRECT 108 (H) 03/01/2014 0409     Physical Exam:    VS:  BP 122/80   Pulse 67   Ht 5' 10" (1.778 m)   Wt (!) 323 lb 3.2 oz (146.6 kg)   SpO2 95%   BMI 46.37 kg/m     Wt Readings from Last 3 Encounters:  06/05/20 (!) 323 lb 3.2 oz (146.6 kg)  06/04/20 (!) 321 lb (145.6 kg)  04/24/20 (!) 316 lb 1.6 oz (143.4 kg)     GEN:  Well nourished, well developed in no acute distress.  Morbidly obese.  Using a Rollator. HEENT: Normal NECK: No JVD; No carotid bruits LYMPHATICS: No lymphadenopathy CARDIAC: RRR, no murmurs, rubs, gallops RESPIRATORY:  Clear to auscultation without rales, wheezing or rhonchi  ABDOMEN:   Soft, non-tender, non-distended MUSCULOSKELETAL:  No edema; No deformity  SKIN: Warm and dry NEUROLOGIC:  Alert and oriented x 3 PSYCHIATRIC:  Normal affect   ASSESSMENT:    1. Junctional rhythm   2. Tachycardia-bradycardia syndrome (HCC)   3. Trifascicular block   4. Right bundle branch block   5. LAFB (left anterior fascicular block)   6. First degree AV block    PLAN:    In order of problems listed above:  1. Symptomatic paroxysmal high degree AV block and Tachy-brady syndrome Patient with history of significant conduction system disease on prior EKGs.  Recently presented to the hospital with worsening shortness of breath and fatigue with some heart failure symptoms.  He was found to be in a junctional rhythm when he arrived to the hospital.  This the first time that I see he has had this rhythm.  He tells me that recently when he was at home he felt lightheaded and very poor so he checked his pulse and found it to be very low.  I suspect that he has been having episodes of higher degree AV block with a junctional rhythm that is taking over.  These are likely very symptomatic episodes and may be contributing to fluid retention. After reviewing all of his information I do think he would benefit from a permanent pacemaker.  I suspect he has progressively worsening conduction system disease and some of  his symptoms may be related to episodes of paroxysmal AV block.  I suspect when he goes into AV block he is left in an escape rhythm which we captured on twelve-lead on the day of his recent admission.    He will need to hold his Eliquis for 2 days prior to the pacemaker implant and restarted 5 days after implant.  2. paroxysmal atrial fibrillation Takes Eliquis for stroke prophylaxis   Medication Adjustments/Labs and Tests Ordered: Current medicines are reviewed at length with the patient today.  Concerns regarding medicines are outlined above.  No orders of the defined types were placed in this encounter.  No orders of the defined types were placed in this encounter.    Signed, Kevon Tench, MD, FACC  06/05/2020 8:58 PM    Electrophysiology [facility] 

## 2020-06-05 NOTE — Patient Instructions (Addendum)
Medication Instructions:  Your physician recommends that you continue on your current medications as directed. Please refer to the Current Medication list given to you today.  Labwork: None ordered.  Testing/Procedures: None ordered.  Follow-Up:  SEE INSTRUCTION LETTER  Any Other Special Instructions Will Be Listed Below (If Applicable).  If you need a refill on your cardiac medications before your next appointment, please call your pharmacy.    Pacemaker Implantation, Adult Pacemaker implantation is a procedure to place a pacemaker inside the chest. A pacemaker is a small computer that sends electrical signals to the heart and helps the heart beat normally. A pacemaker also stores information about heart rhythms. You may need pacemaker implantation if you have:  A slow heartbeat (bradycardia).  Loss of consciousness that happens repeatedly (syncope) or repeated episodes of dizziness or light-headedness because of an irregular heart rate.  Shortness of breath (dyspnea) due to heart problems. The pacemaker usually attaches to your heart through a wire called a lead. One or two leads may be needed. There are different types of pacemakers:  Transvenous pacemaker. This type is placed under the skin or muscle of your upper chest area. The lead goes through a vein in the chest area to reach the inside of the heart.  Epicardial pacemaker. This type is placed under the skin or muscle of your chest or abdomen. The lead goes through your chest to the outside of the heart. Tell a health care provider about:  Any allergies you have.  All medicines you are taking, including vitamins, herbs, eye drops, creams, and over-the-counter medicines.  Any problems you or family members have had with anesthetic medicines.  Any blood or bone disorders you have.  Any surgeries you have had.  Any medical conditions you have.  Whether you are pregnant or may be pregnant. What are the  risks? Generally, this is a safe procedure. However, problems may occur, including:  Infection.  Bleeding.  Failure of the pacemaker or the lead.  Collapse of a lung or bleeding into a lung.  Blood clot inside a blood vessel with a lead.  Damage to the heart.  Infection inside the heart (endocarditis).  Allergic reactions to medicines. What happens before the procedure? Staying hydrated Follow instructions from your health care provider about hydration, which may include:  Up to 2 hours before the procedure - you may continue to drink clear liquids, such as water, clear fruit juice, black coffee, and plain tea.   Eating and drinking restrictions Follow instructions from your health care provider about eating and drinking, which may include:  8 hours before the procedure - stop eating heavy meals or foods, such as meat, fried foods, or fatty foods.  6 hours before the procedure - stop eating light meals or foods, such as toast or cereal.  6 hours before the procedure - stop drinking milk or drinks that contain milk.  2 hours before the procedure - stop drinking clear liquids. Medicines Ask your health care provider about:  Changing or stopping your regular medicines. This is especially important if you are taking diabetes medicines or blood thinners.  Taking medicines such as aspirin and ibuprofen. These medicines can thin your blood. Do not take these medicines unless your health care provider tells you to take them.  Taking over-the-counter medicines, vitamins, herbs, and supplements. Tests You may have:  A heart evaluation. This may include: ? An electrocardiogram (ECG). This involves placing patches on your skin to check your heart rhythm. ?  A chest X-ray. ? An echocardiogram. This is a test that uses sound waves (ultrasound) to produce an image of the heart. ? A cardiac rhythm monitor. This is used to record your heart rhythm and any events for a longer period of  time.  Blood tests.  Genetic testing. General instructions  Do not use any products that contain nicotine or tobacco for at least 4 weeks before the procedure. These products include cigarettes, e-cigarettes, and chewing tobacco. If you need help quitting, ask your health care provider.  Ask your health care provider: ? How your surgery site will be marked. ? What steps will be taken to help prevent infection. These steps may include:  Removing hair at the surgery site.  Washing skin with a germ-killing soap.  Receiving antibiotic medicine.  Plan to have someone take you home from the hospital or clinic.  If you will be going home right after the procedure, plan to have someone with you for 24 hours. What happens during the procedure?  An IV will be inserted into one of your veins.  You will be given one or more of the following: ? A medicine to help you relax (sedative). ? A medicine to numb the area (local anesthetic). ? A medicine to make you fall asleep (general anesthetic).  The next steps vary depending on the type of pacemaker you will be getting. ? If you are getting a transvenous pacemaker:  An incision will be made in your upper chest.  A pocket will be made for the pacemaker. It may be placed under the skin or between layers of muscle.  The lead will be inserted into a blood vessel that goes to the heart.  While X-rays are taken by an imaging machine (fluoroscopy), the lead will be advanced through the vein to the inside of your heart.  The other end of the lead will be tunneled under the skin and attached to the pacemaker. ? If you are getting an epicardial pacemaker:  An incision will be made near your ribs or breastbone (sternum) for the lead.  The lead will be attached to the outside of your heart.  Another incision will be made in your chest or upper abdomen to create a pocket for the pacemaker.  The free end of the lead will be tunneled under the  skin and attached to the pacemaker.  The transvenous or epicardial pacemaker will be tested. Imaging studies may be done to check the lead position.  The incisions will be closed with stitches (sutures), adhesive strips, or skin glue.  Bandages (dressings) will be placed over the incisions. The procedure may vary among health care providers and hospitals. What happens after the procedure?  Your blood pressure, heart rate, breathing rate, and blood oxygen level will be monitored until you leave the hospital or clinic.  You may be given antibiotics.  You will be given pain medicine.  An ECG and chest X-rays will be done.  You may need to wear a continuous type of ECG (Holter monitor) to check your heart rhythm.  Your health care provider will program the pacemaker.  If you were given a sedative during the procedure, it can affect you for several hours. Do not drive or operate machinery until your health care provider says that it is safe.  You will be given a pacemaker identification card. This card lists the implant date, device model, and manufacturer of your pacemaker. Summary  A pacemaker is a small computer that sends  electrical signals to the heart and helps the heart beat normally.  There are different types of pacemakers. A pacemaker may be placed under the skin or muscle of your chest or abdomen.  Follow instructions from your health care provider about eating and drinking and about taking medicines before the procedure. This information is not intended to replace advice given to you by your health care provider. Make sure you discuss any questions you have with your health care provider. Document Revised: 04/11/2019 Document Reviewed: 04/11/2019 Elsevier Patient Education  2021 [employer].

## 2020-06-05 NOTE — Progress Notes (Signed)
Electrophysiology Office Note:    Date:  06/05/2020   ID:  Allen Gould, DOB 05-11-1966, MRN [id number]  PCP:  [name], MD  [facility] Cardiologist:  Fransico Him, MD  [facility] Electrophysiologist:  Vickie Epley, MD  Referring MD: [name], MD   Chief Complaint: Tachycardia-bradycardia syndrome  History of Present Illness:    Allen Gould is a 55 y.o. male who presents for an evaluation of tachycardia-bradycardia, atrial fibrillation, intermittent high degree AV block at the request of Dr. Radford Pax. Their medical history includes morbid obesity, sleep apnea, hypertension, diabetes.  He was just released from the hospital after presenting January 10 for several days of worsening shortness of breath and chest pain.  He reported dyspnea on exertion.  He had significant edema on exam.  Interestingly, when he presented to the emergency department he was found to be in a junctional rhythm.  During our visit today he tells me he has had multiple episodes where he has felt lightheaded and dizzy at home and checked his pulse and found it to be very low.  Past Medical History:  Diagnosis Date  . Acute kidney injury ([place]) 09/01/2019  . Asthma   . Balanitis 08/09/2014  . Bifascicular block   . Cough 08/09/2014  . Diabetes mellitus without complication ([place])   . DM (diabetes mellitus) type 2, uncontrolled, without ketoacidosis 08/10/2013  . Dyslipidemia 03/01/2014  . Financial difficulties 08/09/2014  . GERD (gastroesophageal reflux disease)   . Health care maintenance 03/01/2014  . Hypertension   . Hypertensive urgency 05/29/2017  . Hypomagnesemia 05/29/2017  . Insomnia 08/29/2013  . Morbid obesity ([place]) 08/29/2013  . Near syncope 05/29/2017  . Odontogenic infection of jaw 05/29/2017  . PAC (premature atrial contraction)   . PAF (paroxysmal atrial fibrillation) ([place])   . Pressure injury of skin 09/02/2019  . PVCs (premature ventricular contractions)   . Right shoulder pain  08/29/2013  . Second degree AV block, Mobitz type I   . Sepsis ([place]) 05/29/2017  . Sleep apnea    uses cpap    Past Surgical History:  Procedure Laterality Date  . APPENDECTOMY      Current Medications: Current Meds  Medication Sig  . Accu-Chek Softclix Lancets lancets Check blood sugar 3 times a day  . amLODipine (NORVASC) 10 MG tablet Take 1 tablet (10 mg total) by mouth daily.  [name] apixaban (ELIQUIS) 5 MG TABS tablet Take 1 tablet (5 mg total) by mouth 2 (two) times daily.  [name] aspirin 81 MG EC tablet Take 81 mg by mouth daily. Swallow whole.  [name] atorvastatin (LIPITOR) 40 MG tablet Take 40 mg by mouth daily.  . famotidine (PEPCID) 10 MG tablet Take 1 tablet (10 mg total) by mouth 2 (two) times daily.  [name] FLUoxetine (PROZAC) 20 MG capsule Take 1 capsule (20 mg total) by mouth daily.  [name] gabapentin (NEURONTIN) 300 MG capsule Take 1 capsule (300 mg total) by mouth in the morning, at noon, and at bedtime.  [name] glucose blood (ACCU-CHEK GUIDE) test strip Check blood sugar 3 times a day  . ibuprofen (ADVIL) 600 MG tablet Take 600 mg by mouth as needed for mild pain.  [name] losartan (COZAAR) 50 MG tablet Take 1 tablet (50 mg total) by mouth daily.  . metFORMIN (GLUCOPHAGE) 500 MG tablet Take 1.5 tablets (750 mg total) by mouth daily with breakfast.  . vitamin B-12 (CYANOCOBALAMIN) 1000 MCG tablet Take 1,000 mcg by mouth daily.  Allergies:   Shellfish allergy   Social History   Socioeconomic History  . Marital status: Single    Spouse name: Not on file  . Number of children: Not on file  . Years of education: Not on file  . Highest education level: Not on file  Occupational History  . Not on file  Tobacco Use  . Smoking status: Former Smoker    Types: Cigarettes    Quit date: 04/24/2005    Years since quitting: 15.1  . Smokeless tobacco: Never Used  Vaping Use  . Vaping Use: Never used  Substance and Sexual Activity  . Alcohol use: Yes    Alcohol/week: 2.0 standard drinks    Types: 2  Cans of beer per week    Comment: every other day  . Drug use: No  . Sexual activity: Not on file  Other Topics Concern  . Not on file  Social History Narrative  . Not on file   Social Determinants of Health   Financial Resource Strain: Not on file  Food Insecurity: Not on file  Transportation Needs: Not on file  Physical Activity: Not on file  Stress: Not on file  Social Connections: Not on file     Family History: The patient's family history includes Diabetes in his sister; Heart disease in an other family member; Hyperlipidemia in his sister; Kidney disease in his mother.  ROS:   Please see the history of present illness.    All other systems reviewed and are negative.  EKGs/Labs/Other Studies Reviewed:    The following studies were reviewed today:  May 07, 2020 ZIO personally reviewed There were many episodes of high degree AV block noted during the ZIO monitoring.  The longest episode of high degree AV block lasted almost 6 hours.  Episodes of AV block occurred during awake and sleep hours.  There were also episodes of SVT lasting seconds at a time.     June 02, 2020 EKG personally reviewed shows junctional rhythm    June 03, 2020 EKG personally reviewed shows first-degree AV delay, right bundle branch block and a left anterior fascicular block   Oct 11, 2019 echo personally reviewed Left ventricular function normal Left ventricular hypertrophy  Recent Labs: 10/10/2019: TSH 2.150 10/11/2019: Magnesium 1.7 06/02/2020: ALT 16; B Natriuretic Peptide 93.9 06/04/2020: BUN 18; Creatinine, Ser 1.05; Hemoglobin 12.4; Platelets 250; Potassium 4.0; Sodium 132  Recent Lipid Panel    Component Value Date/Time   CHOL 92 (L) 04/08/2020 0817   TRIG 87 04/08/2020 0817   HDL 40 04/08/2020 0817   CHOLHDL 2.3 04/08/2020 0817   CHOLHDL 6.0 02/28/2014 1508   VLDL NOT CALC 02/28/2014 1508   LDLCALC 35 04/08/2020 0817   LDLDIRECT 108 (H) 03/01/2014 0409     Physical Exam:    VS:  BP 122/80   Pulse 67   Ht 5' 10"  (1.778 m)   Wt (!) 323 lb 3.2 oz (146.6 kg)   SpO2 95%   BMI 46.37 kg/m     Wt Readings from Last 3 Encounters:  06/05/20 (!) 323 lb 3.2 oz (146.6 kg)  06/04/20 (!) 321 lb (145.6 kg)  04/24/20 (!) 316 lb 1.6 oz (143.4 kg)     GEN:  Well nourished, well developed in no acute distress.  Morbidly obese.  Using a Rollator. HEENT: Normal NECK: No JVD; No carotid bruits LYMPHATICS: No lymphadenopathy CARDIAC: RRR, no murmurs, rubs, gallops RESPIRATORY:  Clear to auscultation without rales, wheezing or rhonchi  ABDOMEN:  Soft, non-tender, non-distended MUSCULOSKELETAL:  No edema; No deformity  SKIN: Warm and dry NEUROLOGIC:  Alert and oriented x 3 PSYCHIATRIC:  Normal affect   ASSESSMENT:    1. Junctional rhythm   2. Tachycardia-bradycardia syndrome ([place])   3. Trifascicular block   4. Right bundle branch block   5. LAFB (left anterior fascicular block)   6. First degree AV block    PLAN:    In order of problems listed above:  1. Symptomatic paroxysmal high degree AV block and Tachy-brady syndrome Patient with history of significant conduction system disease on prior EKGs.  Recently presented to the hospital with worsening shortness of breath and fatigue with some heart failure symptoms.  He was found to be in a junctional rhythm when he arrived to the hospital.  This the first time that I see he has had this rhythm.  He tells me that recently when he was at home he felt lightheaded and very poor so he checked his pulse and found it to be very low.  I suspect that he has been having episodes of higher degree AV block with a junctional rhythm that is taking over.  These are likely very symptomatic episodes and may be contributing to fluid retention. After reviewing all of his information I do think he would benefit from a permanent pacemaker.  I suspect he has progressively worsening conduction system disease and some of  his symptoms may be related to episodes of paroxysmal AV block.  I suspect when he goes into AV block he is left in an escape rhythm which we captured on twelve-lead on the day of his recent admission.    He will need to hold his Eliquis for 2 days prior to the pacemaker implant and restarted 5 days after implant.  2. paroxysmal atrial fibrillation Takes Eliquis for stroke prophylaxis   Medication Adjustments/Labs and Tests Ordered: Current medicines are reviewed at length with the patient today.  Concerns regarding medicines are outlined above.  No orders of the defined types were placed in this encounter.  No orders of the defined types were placed in this encounter.    Signed, Lars Mage, MD, [facility]  06/05/2020 8:58 PM    Electrophysiology [place]

## 2020-06-08 NOTE — Discharge Summary (Addendum)
Name: GOHAN COLLISTER MRN: [id number] DOB: 05-13-1966 55 y.o. PCP: [name], MD  Date of Admission: 06/02/2020  9:48 AM Date of Discharge: 06/04/2020 Attending Physician: Dr. Angelia Mould  Discharge Diagnosis: Principal Problem:   Acute respiratory failure with hypoxia and hypoxemia (HCC) Active Problems:   DM (diabetes mellitus) type 2, uncontrolled, without ketoacidosis   Hypertension   Second degree AV block, Mobitz type I   PAF (paroxysmal atrial fibrillation) ([place])    Discharge Medications: Allergies as of 06/04/2020       Reactions   Shellfish Allergy Hives        Medication List     STOP taking these medications    VITAMIN D2 PO       TAKE these medications    Accu-Chek Guide test strip Generic drug: glucose blood Check blood sugar 3 times a day   Accu-Chek Softclix Lancets lancets Check blood sugar 3 times a day   amLODipine 10 MG tablet Commonly known as: NORVASC Take 1 tablet (10 mg total) by mouth daily.   apixaban 5 MG Tabs tablet Commonly known as: ELIQUIS Take 1 tablet (5 mg total) by mouth 2 (two) times daily.   aspirin 81 MG EC tablet Take 81 mg by mouth daily. Swallow whole.   atorvastatin 40 MG tablet Commonly known as: LIPITOR Take 40 mg by mouth daily.   famotidine 10 MG tablet Commonly known as: PEPCID Take 1 tablet (10 mg total) by mouth 2 (two) times daily.   FLUoxetine 20 MG capsule Commonly known as: PROZAC Take 1 capsule (20 mg total) by mouth daily.   gabapentin 300 MG capsule Commonly known as: NEURONTIN Take 1 capsule (300 mg total) by mouth in the morning, at noon, and at bedtime.   ibuprofen 600 MG tablet Commonly known as: ADVIL Take 600 mg by mouth as needed for mild pain.   losartan 50 MG tablet Commonly known as: COZAAR Take 1 tablet (50 mg total) by mouth daily.   metFORMIN 500 MG tablet Commonly known as: GLUCOPHAGE Take 1.5 tablets (750 mg total) by mouth daily with breakfast.   vitamin  B-12 1000 MCG tablet Commonly known as: CYANOCOBALAMIN Take 1,000 mcg by mouth daily.        Disposition and follow-up:   Mr.Daymian A Williamsen was discharged from [facility] in Good condition.  At the hospital follow up visit please address:  1.  Follow-up:  A. HFpEF Exacerbation    B. Cardiac Conduction Abnormalities  2.  Labs / imaging needed at time of follow-up: None  3.  Pending labs/ test needing follow-up: None  4.  Medication Changes  Started: None  Stopped: Ergocalciferol  Abx - None  Follow-up Appointments:  Follow-up Information     [name], MD.   Specialty: Internal Medicine Why: GO: Jan 19 at 1:15pm Contact information: [address] [place] [address] [phone]         [name], MD.   Specialty: Cardiology Why: GO: CONSULT JAN. 13 AT 2:15PM Contact information: [address] [place] [address] [phone]                 Hospital Course by problem list:  Acute hypoxic respiratory failure. Acute on Chronic HFpEF Exacerbation Patient presented with three days of centralized chest pain described as tightness/squeezing/pressure with associated shortness of breath. Patient notes he has had shortness of breath for the past few years that has worsened  over the past year. Denied any other systemic symptoms. Initial O2 sat of 84% on room air that improved with O2 supplementation. Initial exam consistent with CHF exacerbation - JVD, bilateral lower extremity edema. EKG with junctional rhythm. Troponins flat 24-25. BNP 93. CTA r/o PE. Exacerbation thought to be secondary to his new junctional rhythm. A repeat echocardiogram was not repeated as patient had recent echo 09/2019. He was started on IV lasix with improvement of his shortness of breath, oxygen supplementation, and lower extremity edema. He was discharged home with EP follow up the following day, follow up with his  cardiologist Dr. Radford Pax, and follow up in the [facility] clinic.   Cardiac Conduction Abnormalities  Paroxsymal A fib Patient with history of second-degree AV block, A. fib found to be ina  junctional rhythm on admission. He was following with his cardiologist, Dr. Radford Pax, for this and due to him not being symptomatic in the past, there was no need for pacemaker placement. During hospitalization patient found to have episodes of mobitz type I AV block, he remained hemodynamically stable and there was no evidence of progression to complete heart block during his admission.[name] He was scheduled to see Dr. Quentin Ore of EP and discharged a day prior to his appointment.    Discharge Vitals:   BP 120/86 (BP Location: Right Arm)   Pulse 64   Temp 98.4 F (36.9 C) (Oral)   Resp 20   Ht 5' 10"  (1.778 m)   Wt (!) 145.6 kg Comment: scale b  SpO2 95%   BMI 46.06 kg/m   Pertinent Labs, Studies, and Procedures:  CBC Latest Ref Rng & Units 06/04/2020 06/03/2020 06/02/2020  WBC 4.0 - 10.5 K/uL 6.5 6.4 -  Hemoglobin 13.0 - 17.0 g/dL 12.4(L) 12.1(L) 11.9(L)  Hematocrit 39.0 - 52.0 % 37.0(L) 37.1(L) 35.0(L)  Platelets 150 - 400 K/uL 250 238 -    CMP Latest Ref Rng & Units 06/04/2020 06/03/2020 06/02/2020  Glucose 70 - 99 mg/dL 128(H) 142(H) -  BUN 6 - 20 mg/dL 18 14 -  Creatinine 0.61 - 1.24 mg/dL 1.05 0.98 -  Sodium 135 - 145 mmol/L 132(L) 133(L) -  Potassium 3.5 - 5.1 mmol/L 4.0 3.7 -  Chloride 98 - 111 mmol/L 96(L) 98 -  CO2 22 - 32 mmol/L 22 22 -  Calcium 8.9 - 10.3 mg/dL 9.6 9.5 -  Total Protein 6.5 - 8.1 g/dL - - 7.5  Total Bilirubin 0.3 - 1.2 mg/dL - - 0.3  Alkaline Phos 38 - 126 U/L - - 66  AST 15 - 41 U/L - - 13(L)  ALT 0 - 44 U/L - - 16    CT Angio Chest PE W and/or Wo Contrast  Result Date: 06/02/2020 CLINICAL DATA:  55 year old male with history of unexplained chest pain and shortness of breath. Evaluate for potential pulmonary embolism. EXAM: CT ANGIOGRAPHY CHEST WITH CONTRAST TECHNIQUE:  Multidetector CT imaging of the chest was performed using the standard protocol during bolus administration of intravenous contrast. Multiplanar CT image reconstructions and MIPs were obtained to evaluate the vascular anatomy. CONTRAST:  64m OMNIPAQUE IOHEXOL 350 MG/ML SOLN COMPARISON:  No priors. FINDINGS: Cardiovascular: No filling defects within the pulmonary arterial tree to suggest underlying pulmonary embolism. Heart size is borderline enlarged. There is no significant pericardial fluid, thickening or pericardial calcification. There is aortic atherosclerosis, as well as atherosclerosis of the great vessels of the mediastinum and the coronary arteries, including calcified atherosclerotic plaque in the left anterior descending, left circumflex and  right coronary arteries. Calcifications of the aortic valve, mitral valve and mitral annulus. Mediastinum/Nodes: Multiple borderline enlarged and mildly enlarged mediastinal and bilateral hilar lumen lymph nodes (right greater than left), largest of which measures up to 2.2 cm in short axis in the right hilar nodal station (axial image 93 of series 7). Esophagus is unremarkable in appearance. No axillary lymphadenopathy. Lungs/Pleura: Patchy multifocal areas of ground-glass attenuation and interlobular septal thickening throughout the lungs bilaterally, most compatible with interstitial pulmonary edema. Small bilateral pleural effusions lying dependently with some associated passive subsegmental atelectasis in the lower lobes of the lungs bilaterally. No definite suspicious appearing pulmonary nodules or masses are noted. Upper Abdomen: Aortic atherosclerosis. Diffuse low attenuation throughout the visualized hepatic parenchyma, indicative of severe hepatic steatosis. Musculoskeletal: There are no aggressive appearing lytic or blastic lesions noted in the visualized portions of the skeleton. Review of the MIP images confirms the above findings. IMPRESSION: 1. No  evidence of pulmonary embolism. 2. The appearance of the lungs suggests interstitial pulmonary edema. There is also a small bilateral pleural effusions. However, the heart does not appear dilated. Clinical correlation for signs and symptoms of diastolic heart failure is suggested. 3. Aortic atherosclerosis, in addition to 3 vessel coronary artery disease. Please note that although the presence of coronary artery calcium documents the presence of coronary artery disease, the severity of this disease and any potential stenosis cannot be assessed on this non-gated CT examination. Assessment for potential risk factor modification, dietary therapy or pharmacologic therapy may be warranted, if clinically indicated. 4. There are calcifications of the aortic valve and mitral annulus/valve. Echocardiographic correlation for evaluation of potential valvular dysfunction may be warranted if clinically indicated. 5. Multiple prominent borderline enlarged and mildly enlarged mediastinal and bilateral hilar lymph nodes are noted (right greater than left). Clinical correlation for systemic disease such as sarcoidosis or lymphoproliferative disorder is recommended. 6. Hepatic steatosis. Electronically Signed   By: Vinnie Langton M.D.   On: 06/02/2020 18:22   DG Chest Port 1 View  Result Date: 06/02/2020 CLINICAL DATA:  Chest pain, shortness of breath. EXAM: PORTABLE CHEST 1 VIEW COMPARISON:  Oct 10, 2019. FINDINGS: Stable cardiomediastinal silhouette. No pneumothorax or pleural effusion is noted. Minimal bibasilar subsegmental atelectasis is noted. The visualized skeletal structures are unremarkable. IMPRESSION: Minimal bibasilar subsegmental atelectasis. Electronically Signed   By: Marijo Conception M.D.   On: 06/02/2020 13:11     Discharge Instructions: Discharge Instructions     (HEART FAILURE PATIENTS) Call MD:  Anytime you have any of the following symptoms: 1) 3 pound weight gain in 24 hours or 5 pounds in 1 week 2)  shortness of breath, with or without a dry hacking cough 3) swelling in the hands, feet or stomach 4) if you have to sleep on extra pillows at night in order to breathe.   Complete by: As directed    Call MD for:  extreme fatigue   Complete by: As directed    Call MD for:  persistant dizziness or light-headedness   Complete by: As directed    Call MD for:  persistant nausea and vomiting   Complete by: As directed    Call MD for:  redness, tenderness, or signs of infection (pain, swelling, redness, odor or green/yellow discharge around incision site)   Complete by: As directed    Call MD for:  severe uncontrolled pain   Complete by: As directed    Call MD for:  temperature >100.4   Complete by: As  directed    Diet - low sodium heart healthy   Complete by: As directed    Increase activity slowly   Complete by: As directed        Signed: Riesa Pope, MD 06/08/2020, 1:01 PM   Pager: [phone]

## 2020-06-10 NOTE — Telephone Encounter (Signed)
Transition Care Management Follow-up Telephone Call  Date of discharge and from where: Discharged home on1/12/22.  How have you been since you were released from the hospital?  Stated he has been doing ok. Any questions or concerns? No questions. Items Reviewed:  Did the pt receive and understand the discharge instructions provided? Stated yes.  Medications obtained and verified? Yes.    Any new allergies since your discharge? No  Dietary orders reviewed? Yes sated he's on fluid restrictions.               Do you have support at home? Stated he lives at home with his sister who's handicapped.  Home Care and Equipment/Supplies: N/A.  Functional Questionnaire: (I = Independent and D = Dependent) Stated he lives with his sister; he does the best he can do. Independent of ADL's.  Follow up appointments reviewed:   PCP Hospital f/u appt confirmed? Yes  Scheduled to see Dr Lisabeth Devoid  on 06/11/20 @1315PM  .   Are transportation arrangements needed? No stated he uses his sister's car.  If their condition worsens, is the pt aware to call PCP or go to the Emergency Dept.? Yes  Was the patient provided with contact information for the PCP's office or ED? Yes  Was to pt encouraged to call back with questions or concerns? Yes

## 2020-06-11 ENCOUNTER — Other Ambulatory Visit: Payer: Self-pay

## 2020-06-11 ENCOUNTER — Ambulatory Visit (HOSPITAL_COMMUNITY)
Admission: RE | Admit: 2020-06-11 | Discharge: 2020-06-11 | Disposition: A | Discharge status: Home / Self Care | Payer: Medicaid Other | Source: Ambulatory Visit | Attending: Internal Medicine | Admitting: Internal Medicine

## 2020-06-11 ENCOUNTER — Ambulatory Visit: Payer: Medicaid Other | Admitting: Student

## 2020-06-11 ENCOUNTER — Encounter: Payer: Self-pay | Admitting: Student

## 2020-06-11 VITALS — BP 155/71 | HR 76 | Temp 98.1°F | Wt 335.2 lb

## 2020-06-11 DIAGNOSIS — M25551 Pain in right hip: Secondary | ICD-10-CM

## 2020-06-11 DIAGNOSIS — M25552 Pain in left hip: Secondary | ICD-10-CM | POA: Diagnosis not present

## 2020-06-11 DIAGNOSIS — M25559 Pain in unspecified hip: Secondary | ICD-10-CM | POA: Insufficient documentation

## 2020-06-11 DIAGNOSIS — E1165 Type 2 diabetes mellitus with hyperglycemia: Secondary | ICD-10-CM | POA: Diagnosis present

## 2020-06-11 DIAGNOSIS — J9601 Acute respiratory failure with hypoxia: Secondary | ICD-10-CM

## 2020-06-11 DIAGNOSIS — J9602 Acute respiratory failure with hypercapnia: Secondary | ICD-10-CM

## 2020-06-11 DIAGNOSIS — E1110 Type 2 diabetes mellitus with ketoacidosis without coma: Secondary | ICD-10-CM

## 2020-06-11 LAB — POCT GLYCOSYLATED HEMOGLOBIN (HGB A1C): Hemoglobin A1C: 6.7 % — AB (ref 4.0–5.6)

## 2020-06-11 LAB — GLUCOSE, CAPILLARY: Glucose-Capillary: 148 mg/dL — ABNORMAL HIGH (ref 70–99)

## 2020-06-11 MED ORDER — METFORMIN HCL 500 MG PO TABS: 750.0000 mg | ORAL_TABLET | Freq: Every day | ORAL | 3 refills | Status: DC

## 2020-06-11 MED ORDER — ACCU-CHEK GUIDE VI STRP
ORAL_STRIP | 12 refills | Status: AC
Start: 2020-06-11 — End: ?

## 2020-06-11 MED ORDER — METFORMIN HCL 500 MG PO TABS: 750.0000 mg | ORAL_TABLET | Freq: Two times a day (BID) | ORAL | 3 refills | Status: DC

## 2020-06-11 MED ORDER — ACCU-CHEK SOFTCLIX LANCETS MISC: 12 refills | Status: AC

## 2020-06-11 MED ORDER — FUROSEMIDE 40 MG PO TABS: 40.0000 mg | ORAL_TABLET | Freq: Every day | ORAL | 11 refills | Status: DC

## 2020-06-11 NOTE — Progress Notes (Signed)
   CC: hospital follow up for CHF exacerbation secondary to conduction abnormalities   HPI:  [name] is a 55 y.o. with history below presents for hospital follow up. He was hospitalized between 06/02/2020-06/04/2020 for Acute on chronic HFpEF exacerbation secondary to cardiac conduction abnormalities. Please refer to problem based charting for further details and assessment and plan of current problem and chronic medical conditions.  Past Medical History:  Diagnosis Date  . Acute kidney injury ([place]) 09/01/2019  . Asthma   . Balanitis 08/09/2014  . Bifascicular block   . Cough 08/09/2014  . Diabetes mellitus without complication ([place])   . DM (diabetes mellitus) type 2, uncontrolled, without ketoacidosis 08/10/2013  . Dyslipidemia 03/01/2014  . Financial difficulties 08/09/2014  . GERD (gastroesophageal reflux disease)   . Health care maintenance 03/01/2014  . Hypertension   . Hypertensive urgency 05/29/2017  . Hypomagnesemia 05/29/2017  . Insomnia 08/29/2013  . Morbid obesity ([place]) 08/29/2013  . Near syncope 05/29/2017  . Odontogenic infection of jaw 05/29/2017  . PAC (premature atrial contraction)   . PAF (paroxysmal atrial fibrillation) ([place])   . Pressure injury of skin 09/02/2019  . PVCs (premature ventricular contractions)   . Right shoulder pain 08/29/2013  . Second degree AV block, Mobitz type I   . Sepsis ([place]) 05/29/2017  . Sleep apnea    uses cpap   Review of Systems:  Negative as per HPI  Physical Exam:  Vitals:   06/11/20 1318  BP: (!) 155/71  Pulse: 76  Temp: 98.1 F (36.7 C)  TempSrc: Oral  SpO2: 98%  Weight: (!) 335 lb 3.2 oz (152 kg)   Physical Exam Constitutional:      Appearance: Normal appearance.  HENT:     Head: Normocephalic and atraumatic.     Mouth/Throat:     Mouth: Mucous membranes are moist.     Pharynx: Oropharynx is clear.  Eyes:     Extraocular Movements: Extraocular movements intact.     Pupils: Pupils are equal, round, and reactive to light.   Cardiovascular:     Rate and Rhythm: Normal rate. Rhythm irregular.  Pulmonary:     Effort: Pulmonary effort is normal.     Breath sounds: Normal breath sounds.  Abdominal:     General: Bowel sounds are normal. There is distension.     Palpations: There is no mass.     Tenderness: There is no abdominal tenderness.  Musculoskeletal:     Right lower leg: Edema present.     Left lower leg: Edema present.     Comments: Pain with internal rotation of bilateral hips with reduce ROM secondary to pain  Neurological:     General: No focal deficit present.     Mental Status: He is alert. Mental status is at baseline.      Assessment & Plan:   See Encounters Tab for problem based charting.  Patient discussed with Dr. Evette Doffing

## 2020-06-11 NOTE — Patient Instructions (Addendum)
It was a pleasure seeing you in clinic. Today we discussed:   Difficulty breathing: Please start taking lasix 40 mg once a day  Hip pain: I have ordered xrays of your hips  Diabetes: I have refilled you diabetes medications   Please follow up in 1-2 weeks   If you have any questions or concerns, please call our clinic at [phone] between 9am-5pm and after hours call [phone] and ask for the internal medicine resident on call. If you feel you are having a medical emergency please call 911.   Thank you, we look forward to helping you remain healthy!

## 2020-06-12 DIAGNOSIS — M25559 Pain in unspecified hip: Secondary | ICD-10-CM | POA: Insufficient documentation

## 2020-06-12 NOTE — Assessment & Plan Note (Signed)
Patient states he his metformin was switched from 740m twice daily to 500 mg daily when he was at a nursing home recently. States he has been checking glucose about twice a day and rung between 90-150s. Denies episodes of hypoglycemia. States he need refill on meter supplies. A1c of 6.7 day may be related to him taking lower dose of metformin than prior.   Plan  Will refill metformin 750 mg twice daily and meter supplies Patient to bring glucose meter to next appointment

## 2020-06-12 NOTE — Progress Notes (Signed)
Internal Medicine Clinic Attending ? ?Case discussed with Dr. Liang  At the time of the visit.  We reviewed the resident?s history and exam and pertinent patient test results.  I agree with the assessment, diagnosis, and plan of care documented in the resident?s note. ? ?

## 2020-06-12 NOTE — Assessment & Plan Note (Signed)
Patient presents for follow up of acute respiratory failure due to acute on chronic HFpEF exacerbation during recent admission. On EKG, found to have higher degree AV block with a junctional rhythm admission which was thought to be reason for the acute CHF exacerbation. He was treated with IV furosemide during admission with improvement of chest pain and symptoms of SOB.   Since discharge patient has been noting worsening SOB and orthopnea. Reports he has been needed to sleep with many pillows and has been using his nighttime CPAP occassionally during the day. Also notes about a 12 lb increase in weight since discharge with worsening abdominal distention. On exam patient appears fluid overloaded, with bilateral pitting edema, and abdominal distention. He is not currently on diuretics since discharge from hospitalization. Suspect his continued episodes high degree AV block is contributing to worsening fluid overload. He has seen EP Dr. Quentin Ore recently and planned to have pacemaker placement for this planned for 06/24/2020. In the short, term patient will benefit from continue diuresis to avoid exacerbation of his CHF. Will plan to start on Lasix 40 mg daily and evaluate if he will continue to need this following pacemaker placement.  Plan  Start Furosemide 40 mg daily Follow up in 2 weeks

## 2020-06-12 NOTE — Assessment & Plan Note (Addendum)
Patient notes he has had bilateral hip pain for years but recently has been worsening. State pain makes it difficult for him to complete tasks such as grocery shopping and is concerned he will fall due to the pain. He was able to get a walker with a seat which has help him with his mobility at home and allows him to sit when feeling unstable. However states he has significant bilateral hip pain that worsens with standing and activity. He states Ibuprofen 600 mg about twice a week when he need to leave the house for errands but generally has pain lasting about 2 days after this making it difficult for him to leave the house. Denies any recent falls but is concerned he may fall due to pain. On exam he has pain in bilateral hip joint that worse with internal rotation of the hip with limited ROM due to pain. Given his weight and symptoms suspect his may be due to OA. Will order xrays of bilateral hips for this. He can continue to take ibuprofen on bad pain days. Discussed weight loss may help with the pain and would be necessary should he need hip replacement in future.   Plan Bilateral hip xrays May consider starting on switching his fluoxetine to duloxetine to help with pain  Encourage weight loss

## 2020-06-18 ENCOUNTER — Ambulatory Visit: Payer: Medicaid Other | Admitting: Nurse Practitioner

## 2020-06-21 ENCOUNTER — Other Ambulatory Visit (HOSPITAL_COMMUNITY): Payer: Medicaid Other

## 2020-06-22 NOTE — Progress Notes (Unsigned)
Virtual Visit via Video Note   This visit type was conducted due to national recommendations for restrictions regarding the COVID-19 Pandemic (e.g. social distancing) in an effort to limit this patient's exposure and mitigate transmission in our community.  Due to his co-morbid illnesses, this patient is at least at moderate risk for complications without adequate follow up.  This format is felt to be most appropriate for this patient at this time.  All issues noted in this document were discussed and addressed.  A limited physical exam was performed with this format.  Please refer to the patient's chart for his consent to telehealth for [facility].       Date:  06/23/2020   ID:  Allen Gould, DOB 05-02-1966, MRN [id number] The patient was identified using 2 identifiers.  Patient Location: Home Provider Location: Office/Clinic  PCP:  [name], MD  Cardiologist:  Fransico Him, MD   Electrophysiologist:  Vickie Epley, MD   Evaluation Performed:  Follow-Up Visit  Chief Complaint:  Hospitalization Follow-up (CHF)    Patient Profile: Allen Gould is a 55 y.o. male with:  Coronary artery disease ? Mild to mod non-obs Dz by cath at [facility] in 2018   Paroxysmal atrial fibrillation ? CHA2DS2-VASc=3 (HTN, Diab, CAD)  ? admx in 11/2018 at [facility] w scrotal abscess >> AF w RVR ? 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; junctional rhythm   Monitor 04/2020   Alcohol abuse  Morbid obesity  OSA   Hypertension   Diabetes mellitus   Macrocytic anemia  Admitted in 5/21 with starvation ketosis   Hepatic steatosis   Aortic atherosclerosis (CT in 05/2020)   Prior CV studies: Event monitor 12/21 NSR, nonsustained WCT (6 beats); nonsustained ATach (148 bpm), 2nd  degree Type 1 and Type 2 AVB; 3rd degree AVB (2.2 sec) occurring during sleep  Echocardiogram 10/11/2019 EF 60-65, no RWMA, moderate LVH, normal RVSF, mild LAE, severe calcification of anterior mitral valve leaflet, trivial MR, mild MS, mild AS (mean gradient 12 mmHg), borderline dilatation of aortic root (39 mm)  Echocardiogram 11/30/2018 ([facility]) EF 60-65, BAE, mod LVH  Echocardiogram 07/01/2018 ([facility]) EF 60-65  Echocardiogram 05/30/2017 Moderate concentric LVH, EF 55-60, normal wall motion, GR 1 DD, aortic root 39 mm, mild MR  Cardiac catheterization 10/27/16 ([facility]) Report states:  "mild to mod non-obstructive dz"  Echocardiogram 10/25/16 ([facility]) EF 55-60, mod LVH, Gr 1 DD, mild LAE    History of Present Illness:   Allen Gould was last seen by Dr. Radford Pax in 11/21.  A f/u event monitor showed episodes of ATach as well as Type I and II 2nd degree AVB and nocturnal 3rd degree AVB.  He was to be referred to EP for evaluation but was admitted 1/10-1/12 with acute heart failure with preserved EF.  He had evidence of interstitial edema on CT.  He was also noted to be in junctional rhythm.  He had f/u with Dr. Quentin Ore after DC and is actually scheduled for a pacemaker implantation tomorrow (06/24/20).  He is seen for f/u.  Overall, he is doing well.  After seeing Dr. Quentin Ore, he saw his PCP who placed him on furosemide.  His weight was up 12 pounds and he was noting worsening shortness of breath.  He notes that his  breathing is somewhat improved.  It is definitely not getting any worse.  He has not had any leg edema, syncope.  He does note decreased exercise tolerance.  He does have chest discomfort after activity.  This has been ongoing for several months without change.  He does not have a blood pressure cuff or scale.   Past Medical History:  Diagnosis Date  . Acute kidney injury ([place]) 09/01/2019  . Asthma   . Balanitis 08/09/2014  . Bifascicular block   . Cough 08/09/2014  . Diabetes mellitus without  complication ([place])   . DM (diabetes mellitus) type 2, uncontrolled, without ketoacidosis 08/10/2013  . Dyslipidemia 03/01/2014  . Financial difficulties 08/09/2014  . GERD (gastroesophageal reflux disease)   . Health care maintenance 03/01/2014  . Hypertension   . Hypertensive urgency 05/29/2017  . Hypomagnesemia 05/29/2017  . Insomnia 08/29/2013  . Morbid obesity ([place]) 08/29/2013  . Near syncope 05/29/2017  . Odontogenic infection of jaw 05/29/2017  . PAC (premature atrial contraction)   . PAF (paroxysmal atrial fibrillation) ([place])   . Pressure injury of skin 09/02/2019  . PVCs (premature ventricular contractions)   . Right shoulder pain 08/29/2013  . Second degree AV block, Mobitz type I   . Sepsis ([place]) 05/29/2017  . Sleep apnea    uses cpap   Past Surgical History:  Procedure Laterality Date  . APPENDECTOMY       Current Meds  Medication Sig  . Accu-Chek Softclix Lancets lancets Check blood sugar 3 times a day  . amLODipine (NORVASC) 10 MG tablet Take 1 tablet (10 mg total) by mouth daily.  [name] apixaban (ELIQUIS) 5 MG TABS tablet Take 1 tablet (5 mg total) by mouth 2 (two) times daily.  [name] aspirin 81 MG EC tablet Take 81 mg by mouth daily. Swallow whole.  [name] atorvastatin (LIPITOR) 80 MG tablet Take 80 mg by mouth daily.  . famotidine (PEPCID) 10 MG tablet Take 1 tablet (10 mg total) by mouth 2 (two) times daily.  [name] FLUoxetine (PROZAC) 20 MG capsule Take 1 capsule (20 mg total) by mouth daily.  . furosemide (LASIX) 40 MG tablet Take 1 tablet (40 mg total) by mouth daily.  [name] gabapentin (NEURONTIN) 300 MG capsule Take 1 capsule (300 mg total) by mouth in the morning, at noon, and at bedtime.  [name] glucose blood (ACCU-CHEK GUIDE) test strip Check blood sugar 3 times a day  . HUMALOG 100 UNIT/ML injection Inject into the skin as needed.  [name] ibuprofen (ADVIL) 600 MG tablet Take 600 mg by mouth every 8 (eight) hours as needed for mild pain.  [name] losartan (COZAAR) 50 MG tablet Take 1 tablet (50 mg total) by  mouth daily.  . metFORMIN (GLUCOPHAGE-XR) 750 MG 24 hr tablet Take 750 mg by mouth in the morning and at bedtime.  . vitamin B-12 (CYANOCOBALAMIN) 1000 MCG tablet Take 1,000 mcg by mouth daily.     Allergies:   Shellfish allergy   Social History   Tobacco Use  . Smoking status: Former Smoker    Types: Cigarettes    Quit date: 04/24/2005    Years since quitting: 15.1  . Smokeless tobacco: Never Used  Vaping Use  . Vaping Use: Never used  Substance Use Topics  . Alcohol use: Yes    Alcohol/week: 2.0 standard drinks    Types: 2 Cans of beer per week    Comment: every other day  . Drug use: No     Family Hx: The patient's  family history includes Diabetes in his sister; Heart disease in an other family member; Hyperlipidemia in his sister; Kidney disease in his mother.  ROS:   Please see the history of present illness.    He has not had any melena or hematochezia or hematuria.   Labs/Other Tests and Data Reviewed:    EKG:  No ECG reviewed.  Recent Labs: 10/10/2019: TSH 2.150 10/11/2019: Magnesium 1.7 06/02/2020: ALT 16; B Natriuretic Peptide 93.9 06/04/2020: BUN 18; Creatinine, Ser 1.05; Hemoglobin 12.4; Platelets 250; Potassium 4.0; Sodium 132   Recent Lipid Panel Lab Results  Component Value Date/Time   CHOL 92 (L) 04/08/2020 08:17 AM   TRIG 87 04/08/2020 08:17 AM   HDL 40 04/08/2020 08:17 AM   CHOLHDL 2.3 04/08/2020 08:17 AM   CHOLHDL 6.0 02/28/2014 03:08 PM   LDLCALC 35 04/08/2020 08:17 AM   LDLDIRECT 108 (H) 03/01/2014 04:09 AM    Wt Readings from Last 3 Encounters:  06/23/20 (!) 335 lb (152 kg)  06/11/20 (!) 335 lb 3.2 oz (152 kg)  06/05/20 (!) 323 lb 3.2 oz (146.6 kg)     Risk Assessment/Calculations:    CHA2DS2-VASc Score = 4  This indicates a 4.8% annual risk of stroke. The patient's score is based upon: CHF History: Yes HTN History: Yes Diabetes History: Yes Stroke History: No Vascular Disease History: Yes Age Score: 0 Gender Score: 0      Objective:    Vital Signs:  Ht 5' 10"  (1.778 m)   Wt (!) 335 lb (152 kg)   BMI 48.07 kg/m    VITAL SIGNS:  reviewed GEN:  no acute distress EYES:  Sclera nonicteric RESPIRATORY:  Normal respiratory effort PSYCH:  normal affect  ASSESSMENT & PLAN:    1. Chronic heart failure with preserved ejection fraction [facility]) The patient was admitted in December with volume excess.  He was also noted have junctional rhythm.  He has seen EP and the plan is for pacemaker implantation tomorrow.  He was placed on furosemide several weeks ago by his PCP.  Unfortunately, he does not have a scale or blood pressure cuff.  He does note some improvement in his shortness of breath.  He does not feel like anything is really worsened.  At this point, it is difficult to know if he needs further diuresis or not.  Overall he seems stable and therefore, I will keep him on his current dose of furosemide.  He does have follow-up soon with his PCP and a BMET can be obtained at that visit to reassess his potassium.  I will see if we can arrange a scale and blood pressure cuff through our patient care fund for him.  Follow-up with Dr. Radford Pax or me in 4 weeks.  2. PAF (paroxysmal atrial fibrillation) ([place]) He is now back on anticoagulation.  He seems to be tolerating this well.  His hemoglobin earlier this month was normal.  3. Junctional rhythm 4. Tachycardia-bradycardia syndrome ([place]) He has symptomatic bradycardia.  He is scheduled for pacemaker implantation tomorrow with Dr. Quentin Ore.  5. Coronary artery disease involving native coronary artery of native heart without angina pectoris He notes chest discomfort after activity.  This has been ongoing for several months now without significant change.  He has a history of nonobstructive disease on cardiac catheterization in 2018.  He does have diabetes and would be at risk for worsening coronary artery disease.  However, I question if his current symptoms are all related  to his bradycardia.  If  these continue after his pacemaker, we may need to consider further ischemic evaluation.  Given his size, a Myoview would likely not be helpful.  We may need to consider coronary CTA.  Continue statin therapy.  6. Essential hypertension He is unable to obtain a blood pressure reading today.  We will try to get him a blood pressure cuff to use at home.      Time:   Today, I have spent 15 minutes with the patient with telehealth technology discussing the above problems.     Medication Adjustments/Labs and Tests Ordered: Current medicines are reviewed at length with the patient today.  Concerns regarding medicines are outlined above.   Tests Ordered: No orders of the defined types were placed in this encounter.   Medication Changes: No orders of the defined types were placed in this encounter.   Follow Up:  In Person in 4 week(s)  Signed, Richardson Dopp, PA-C  06/23/2020 10:10 AM    [place]

## 2020-06-23 ENCOUNTER — Telehealth (INDEPENDENT_AMBULATORY_CARE_PROVIDER_SITE_OTHER): Payer: Medicaid Other | Admitting: Physician Assistant

## 2020-06-23 ENCOUNTER — Other Ambulatory Visit: Payer: Self-pay

## 2020-06-23 ENCOUNTER — Other Ambulatory Visit (HOSPITAL_COMMUNITY)
Admission: RE | Admit: 2020-06-23 | Discharge: 2020-06-23 | Disposition: A | Discharge status: Home / Self Care | Payer: Medicaid Other | Source: Ambulatory Visit | Attending: Cardiology | Admitting: Cardiology

## 2020-06-23 ENCOUNTER — Telehealth: Payer: Self-pay | Admitting: *Deleted

## 2020-06-23 ENCOUNTER — Encounter: Payer: Self-pay | Admitting: Physician Assistant

## 2020-06-23 VITALS — Ht 70.0 in | Wt 335.0 lb

## 2020-06-23 DIAGNOSIS — I480 Paroxysmal atrial fibrillation: Secondary | ICD-10-CM

## 2020-06-23 DIAGNOSIS — Z20822 Contact with and (suspected) exposure to covid-19: Secondary | ICD-10-CM | POA: Insufficient documentation

## 2020-06-23 DIAGNOSIS — Z01812 Encounter for preprocedural laboratory examination: Secondary | ICD-10-CM | POA: Diagnosis present

## 2020-06-23 DIAGNOSIS — I495 Sick sinus syndrome: Secondary | ICD-10-CM

## 2020-06-23 DIAGNOSIS — I498 Other specified cardiac arrhythmias: Secondary | ICD-10-CM | POA: Diagnosis not present

## 2020-06-23 DIAGNOSIS — I5032 Chronic diastolic (congestive) heart failure: Secondary | ICD-10-CM

## 2020-06-23 DIAGNOSIS — I2510 Atherosclerotic heart disease of native coronary artery without angina pectoris: Secondary | ICD-10-CM

## 2020-06-23 DIAGNOSIS — I10 Essential (primary) hypertension: Secondary | ICD-10-CM

## 2020-06-23 LAB — SARS CORONAVIRUS 2 (TAT 6-24 HRS): SARS Coronavirus 2: NEGATIVE

## 2020-06-23 NOTE — Progress Notes (Signed)
Instructed patient on the following items: Arrival time 1030 Nothing to eat or drink after midnight No meds AM of procedure Responsible person to drive you home and stay with you for 24 hrs Wash with special soap night before and morning of procedure If on anti-coagulant drug instructions Eliquis- last dose was 1/29

## 2020-06-23 NOTE — Patient Instructions (Signed)
Medication Instructions:  None *If you need a refill on your cardiac medications before your next appointment, please call your pharmacy*   Lab Work: None If you have labs (blood work) drawn today and your tests are completely normal, you will receive your results only by: [name] MyChart Message (if you have MyChart) OR . A paper copy in the mail If you have any lab test that is abnormal or we need to change your treatment, we will call you to review the results.   Testing/Procedures: None   Follow-Up: At [facility], you and your health needs are our priority.  As part of our continuing mission to provide you with exceptional heart care, we have created designated Provider Care Teams.  These Care Teams include your primary Cardiologist (physician) and Advanced Practice Providers (APPs -  Physician Assistants and Nurse Practitioners) who all work together to provide you with the care you need, when you need it.  We recommend signing up for the patient portal called "MyChart".  Sign up information is provided on this After Visit Summary.  MyChart is used to connect with patients for Virtual Visits (Telemedicine).  Patients are able to view lab/test results, encounter notes, upcoming appointments, etc.  Non-urgent messages can be sent to your provider as well.   To learn more about what you can do with MyChart, go to [email or web address].    Your next appointment:   4 week(s)  The format for your next appointment:   In Person  Provider:   Fransico Him, MD or Richardson Dopp, PA-C   Other Instructions I will leave a BP cuff and a scale at the front desk as we discussed.

## 2020-06-23 NOTE — Telephone Encounter (Signed)
  Patient Consent for Virtual Visit         Allen Gould has provided verbal consent on 06/23/2020 for a virtual visit (video or telephone).   CONSENT FOR VIRTUAL VISIT FOR:  Allen Gould  By participating in this virtual visit I agree to the following:  I hereby voluntarily request, consent and authorize [facility] [place] [facility] and its employed or contracted physicians, physician assistants, nurse practitioners or other licensed health care professionals (the Practitioner), to provide me with telemedicine health care services (the "Services") as deemed necessary by the treating Practitioner. I acknowledge and consent to receive the Services by the Practitioner via telemedicine. I understand that the telemedicine visit will involve communicating with the Practitioner through live audiovisual communication technology and the disclosure of certain medical information by electronic transmission. I acknowledge that I have been given the opportunity to request an in-person assessment or other available alternative prior to the telemedicine visit and am voluntarily participating in the telemedicine visit.  I understand that I have the right to withhold or withdraw my consent to the use of telemedicine in the course of my care at any time, without affecting my right to future care or treatment, and that the Practitioner or I may terminate the telemedicine visit at any time. I understand that I have the right to inspect all information obtained and/or recorded in the course of the telemedicine visit and may receive copies of available information for a reasonable fee.  I understand that some of the potential risks of receiving the Services via telemedicine include:  [name] Delay or interruption in medical evaluation due to technological equipment failure or disruption; . Information transmitted may not be sufficient (e.g. poor resolution of images) to allow for appropriate medical decision making by the Practitioner;  and/or  . In rare instances, security protocols could fail, causing a breach of personal health information.  Furthermore, I acknowledge that it is my responsibility to provide information about my medical history, conditions and care that is complete and accurate to the best of my ability. I acknowledge that Practitioner's advice, recommendations, and/or decision may be based on factors not within their control, such as incomplete or inaccurate data provided by me or distortions of diagnostic images or specimens that may result from electronic transmissions. I understand that the practice of medicine is not an exact science and that Practitioner makes no warranties or guarantees regarding treatment outcomes. I acknowledge that a copy of this consent can be made available to me via my patient portal ([place]), or I can request a printed copy by calling the office of [facility].    I understand that my insurance will be billed for this visit.   I have read or had this consent read to me. . I understand the contents of this consent, which adequately explains the benefits and risks of the Services being provided via telemedicine.  . I have been provided ample opportunity to ask questions regarding this consent and the Services and have had my questions answered to my satisfaction. . I give my informed consent for the services to be provided through the use of telemedicine in my medical care

## 2020-06-24 ENCOUNTER — Ambulatory Visit (HOSPITAL_COMMUNITY)
Admission: RE | Disposition: A | Discharge status: Home / Self Care | Payer: Self-pay | Source: Home / Self Care | Attending: Cardiology

## 2020-06-24 ENCOUNTER — Ambulatory Visit (HOSPITAL_COMMUNITY)
Admission: RE | Admit: 2020-06-24 | Discharge: 2020-06-24 | Disposition: A | Discharge status: Home / Self Care | Payer: Medicare Other | Attending: Cardiology | Admitting: Cardiology

## 2020-06-24 ENCOUNTER — Ambulatory Visit (HOSPITAL_COMMUNITY): Payer: Medicare Other

## 2020-06-24 ENCOUNTER — Other Ambulatory Visit: Payer: Self-pay

## 2020-06-24 DIAGNOSIS — Z7901 Long term (current) use of anticoagulants: Secondary | ICD-10-CM | POA: Insufficient documentation

## 2020-06-24 DIAGNOSIS — Z91013 Allergy to seafood: Secondary | ICD-10-CM | POA: Diagnosis not present

## 2020-06-24 DIAGNOSIS — Z79899 Other long term (current) drug therapy: Secondary | ICD-10-CM | POA: Insufficient documentation

## 2020-06-24 DIAGNOSIS — Z7984 Long term (current) use of oral hypoglycemic drugs: Secondary | ICD-10-CM | POA: Insufficient documentation

## 2020-06-24 DIAGNOSIS — Z87891 Personal history of nicotine dependence: Secondary | ICD-10-CM | POA: Insufficient documentation

## 2020-06-24 DIAGNOSIS — I442 Atrioventricular block, complete: Secondary | ICD-10-CM | POA: Insufficient documentation

## 2020-06-24 DIAGNOSIS — Z7982 Long term (current) use of aspirin: Secondary | ICD-10-CM | POA: Diagnosis not present

## 2020-06-24 DIAGNOSIS — Z950 Presence of cardiac pacemaker: Secondary | ICD-10-CM

## 2020-06-24 DIAGNOSIS — I495 Sick sinus syndrome: Secondary | ICD-10-CM | POA: Insufficient documentation

## 2020-06-24 DIAGNOSIS — I480 Paroxysmal atrial fibrillation: Secondary | ICD-10-CM | POA: Insufficient documentation

## 2020-06-24 HISTORY — PX: PACEMAKER IMPLANT: EP1218

## 2020-06-24 LAB — GLUCOSE, CAPILLARY: Glucose-Capillary: 126 mg/dL — ABNORMAL HIGH (ref 70–99)

## 2020-06-24 SURGERY — PACEMAKER IMPLANT

## 2020-06-24 MED ORDER — FENTANYL CITRATE (PF) 100 MCG/2ML IJ SOLN
INTRAMUSCULAR | Status: DC | PRN
  Administered 2020-06-24 (×2): 25 ug via INTRAVENOUS

## 2020-06-24 MED ORDER — APIXABAN 5 MG PO TABS: 5.0000 mg | ORAL_TABLET | Freq: Two times a day (BID) | ORAL | 3 refills | Status: DC

## 2020-06-24 MED ORDER — HEPARIN (PORCINE) IN NACL 1000-0.9 UT/500ML-% IV SOLN
INTRAVENOUS | Status: AC
  Filled 2020-06-24: qty 500

## 2020-06-24 MED ORDER — ONDANSETRON HCL 4 MG/2ML IJ SOLN: 4.0000 mg | Freq: Four times a day (QID) | INTRAMUSCULAR | Status: DC | PRN

## 2020-06-24 MED ORDER — FENTANYL CITRATE (PF) 100 MCG/2ML IJ SOLN
INTRAMUSCULAR | Status: AC
  Filled 2020-06-24: qty 2

## 2020-06-24 MED ORDER — SODIUM CHLORIDE 0.9 % IV SOLN
80.0000 mg | INTRAVENOUS | Status: AC
  Administered 2020-06-24: 80 mg
  Filled 2020-06-24: qty 2

## 2020-06-24 MED ORDER — HEPARIN (PORCINE) IN NACL 1000-0.9 UT/500ML-% IV SOLN
INTRAVENOUS | Status: DC | PRN
  Administered 2020-06-24: 500 mL

## 2020-06-24 MED ORDER — MIDAZOLAM HCL 5 MG/5ML IJ SOLN
INTRAMUSCULAR | Status: DC | PRN
  Administered 2020-06-24 (×2): 1 mg via INTRAVENOUS

## 2020-06-24 MED ORDER — MIDAZOLAM HCL 5 MG/5ML IJ SOLN
INTRAMUSCULAR | Status: AC
  Filled 2020-06-24: qty 5

## 2020-06-24 MED ORDER — SODIUM CHLORIDE 0.9 % IV SOLN: INTRAVENOUS | Status: DC

## 2020-06-24 MED ORDER — NITROGLYCERIN 1 MG/10 ML FOR IR/CATH LAB
INTRA_ARTERIAL | Status: AC
  Filled 2020-06-24: qty 10

## 2020-06-24 MED ORDER — ACETAMINOPHEN 325 MG PO TABS: 325.0000 mg | ORAL_TABLET | ORAL | Status: DC | PRN

## 2020-06-24 MED ORDER — SODIUM CHLORIDE 0.9 % IV SOLN
INTRAVENOUS | Status: AC
  Filled 2020-06-24: qty 2

## 2020-06-24 MED ORDER — CEFAZOLIN SODIUM-DEXTROSE 2-4 GM/100ML-% IV SOLN
INTRAVENOUS | Status: AC
  Filled 2020-06-24: qty 100

## 2020-06-24 MED ORDER — DEXTROSE 5 % IV SOLN
3.0000 g | INTRAVENOUS | Status: AC
  Administered 2020-06-24: 3 g via INTRAVENOUS
  Filled 2020-06-24 (×2): qty 3000

## 2020-06-24 MED ORDER — CHLORHEXIDINE GLUCONATE 4 % EX LIQD
4.0000 "application " | Freq: Once | CUTANEOUS | Status: DC
  Filled 2020-06-24: qty 60

## 2020-06-24 MED ORDER — LIDOCAINE HCL (PF) 1 % IJ SOLN
INTRAMUSCULAR | Status: AC
  Filled 2020-06-24: qty 60

## 2020-06-24 MED ORDER — LIDOCAINE HCL (PF) 1 % IJ SOLN
INTRAMUSCULAR | Status: DC | PRN
  Administered 2020-06-24: 50 mL
  Administered 2020-06-24: 10 mL

## 2020-06-24 MED ORDER — POVIDONE-IODINE 10 % EX SWAB: 2.0000 "application " | Freq: Once | CUTANEOUS | Status: DC

## 2020-06-24 SURGICAL SUPPLY — 7 items
CABLE SURGICAL S-101-97-12 (CABLE) ×2 IMPLANT
LEAD TENDRIL MRI 52CM LPA1200M (Lead) ×1 IMPLANT
LEAD TENDRIL MRI 58CM LPA1200M (Lead) ×1 IMPLANT
PACEMAKER ASSURITY DR-RF (Pacemaker) ×1 IMPLANT
PAD PRO RADIOLUCENT 2001M-C (PAD) ×2 IMPLANT
SHEATH 8FR PRELUDE SNAP 13 (SHEATH) ×2 IMPLANT
TRAY PACEMAKER INSERTION (PACKS) ×2 IMPLANT

## 2020-06-24 NOTE — Discharge Instructions (Addendum)
After Your Pacemaker   . You have a [employer] Pacemaker  ACTIVITY . Do not lift your arm above shoulder height for 1 week after your procedure. After 7 days, you may progress as below.  . You should remove your sling 24 hours after your procedure, unless otherwise instructed by your provider.     Tuesday July 01, 2020  Wednesday July 02, 2020 Thursday July 03, 2020 Friday July 04, 2020   . Do not lift, push, pull, or carry anything over 10 pounds with the affected arm until 6 weeks (Tuesday August 05, 2020 ) after your procedure.   . Do NOT DRIVE until you have been seen for your wound check, or as long as instructed by your healthcare provider.   . Ask your healthcare provider when you can go back to work   INCISION/Dressing . DO NOT restart your Eliquis until 06/29/20  . If large square, outer bandage is left in place, this can be removed after 24 hours from your procedure. Do not remove steri-strips or glue as below.   . Monitor your Pacemaker site for redness, swelling, and drainage. Call the device clinic at [phone] if you experience these symptoms or fever/chills.  . Your incision is sealed with Steri-strips or staples, you may shower 10 days after your procedure or when told by your provider. Do not remove the steri-strips or let the shower hit directly on your site. You may wash around your site with soap and water.    [name] Avoid lotions, ointments, or perfumes over your incision until it is well-healed.  . You may use a hot tub or a pool AFTER your wound check appointment if the incision is completely closed.  [name] PAcemaker Alerts:  Some alerts are vibratory and others beep. These are NOT emergencies. Please call our office to let us know. If this occurs at night or on weekends, it can wait until the next business day. Send a remote transmission.  . If your device is capable of reading fluid status (for heart failure), you will be offered monthly monitoring to  review this with you.   DEVICE MANAGEMENT . Remote monitoring is used to monitor your pacemaker from home. This monitoring is scheduled every 91 days by our office. It allows [place] to keep an eye on the functioning of your device to ensure it is working properly. You will routinely see your Electrophysiologist annually (more often if necessary).   . You should receive your ID card for your new device in 4-8 weeks. Keep this card with you at all times once received. Consider wearing a medical alert bracelet or necklace.  . Your Pacemaker may be MRI compatible. This will be discussed at your next office visit/wound check.  You should avoid contact with strong electric or magnetic fields.    Do not use amateur (ham) radio equipment or electric (arc) welding torches. MP3 player headphones with magnets should not be used. Some devices are safe to use if held at least 12 inches (30 cm) from your Pacemaker. These include power tools, lawn mowers, and speakers. If you are unsure if something is safe to use, ask your health care provider.   When using your cell phone, hold it to the ear that is on the opposite side from the Pacemaker. Do not leave your cell phone in a pocket over the Pacemaker.   You may safely use electric blankets, heating pads, computers, and microwave ovens.  Call the office right away if:  You have chest pain.  You feel more short of breath than you have felt before.  You feel more light-headed than you have felt before.  Your incision starts to open up.  This information is not intended to replace advice given to you by your health care provider. Make sure you discuss any questions you have with your health care provider.    PLEASE RESTART YOUR ELIQUIS ON June 29, 2020.

## 2020-06-24 NOTE — Interval H&P Note (Signed)
History and Physical Interval Note:  06/24/2020 1:52 PM  Allen Gould  has presented today for surgery, with the diagnosis of tachy brady syndrome.  The various methods of treatment have been discussed with the patient and family. After consideration of risks, benefits and other options for treatment, the patient has consented to  Procedure(s): PACEMAKER IMPLANT (N/A) as a surgical intervention.  The patient's history has been reviewed, patient examined, no change in status, stable for surgery.  I have reviewed the patient's chart and labs.  Questions were answered to the patient's satisfaction.     [name]

## 2020-06-25 ENCOUNTER — Encounter: Payer: Medicaid Other | Admitting: Internal Medicine

## 2020-06-25 ENCOUNTER — Encounter (HOSPITAL_COMMUNITY): Payer: Self-pay | Admitting: Cardiology

## 2020-06-25 ENCOUNTER — Telehealth: Payer: Self-pay

## 2020-06-25 MED FILL — Cefazolin Sodium-Dextrose IV Solution 2 GM/100ML-4%: INTRAVENOUS | Qty: 100 | Status: AC

## 2020-06-25 MED FILL — Nitroglycerin IV Soln 100 MCG/ML in D5W: INTRA_ARTERIAL | Qty: 10 | Status: AC

## 2020-06-25 NOTE — Telephone Encounter (Signed)
-----   Message from [facility], [place] sent at 06/25/2020 10:23 AM EST ----- Same day d/c YESTERDAY  SJM PPM  CL

## 2020-06-25 NOTE — Telephone Encounter (Signed)
Follow-up after same day discharge: Implant date: 06/24/20 MD: Dr. Quentin Ore Device: SJM PPM Location: Left Chest   Wound check visit: 07/08/20  90 day MD follow-up: 10/07/20  Remote Transmission received:Assisted pt with sending activation transmission.    Dressing removed: Pressure dressing applied/ Scheduled for DC appt on 2/3.    Left arm activity instructions reviewed.    Pt reports some pain that is tolerable, has not required tylenol.  Reports some itch associated with pressure dressing.

## 2020-06-25 NOTE — Progress Notes (Deleted)
Established Patient Office Visit  Subjective:  Patient ID: Allen Gould, male    DOB: 1965/06/19  Age: 55 y.o. MRN: [id number]  CC: No chief complaint on file.   HPI Allen Gould presents for ***f/u of HF.  Started on lasix 40 mg QD 2 weeks ago in [facility].    PMHx: HTN, PAF, Bifascicular block, second degree AV block s/p pacemekare placement yesterday, DM2, depression  Medications: ASA, Amlodipine, losartan 50 mg QD, Metfromin 750 mg QD, Humolog?, Pepcid, lipitor, Eliquis   Past Medical History:  Diagnosis Date  . Acute kidney injury ([place]) 09/01/2019  . Asthma   . Balanitis 08/09/2014  . Bifascicular block   . Cough 08/09/2014  . Diabetes mellitus without complication ([place])   . DM (diabetes mellitus) type 2, uncontrolled, without ketoacidosis 08/10/2013  . Dyslipidemia 03/01/2014  . Financial difficulties 08/09/2014  . GERD (gastroesophageal reflux disease)   . Health care maintenance 03/01/2014  . Hypertension   . Hypertensive urgency 05/29/2017  . Hypomagnesemia 05/29/2017  . Insomnia 08/29/2013  . Morbid obesity ([place]) 08/29/2013  . Near syncope 05/29/2017  . Odontogenic infection of jaw 05/29/2017  . PAC (premature atrial contraction)   . PAF (paroxysmal atrial fibrillation) ([place])   . Pressure injury of skin 09/02/2019  . PVCs (premature ventricular contractions)   . Right shoulder pain 08/29/2013  . Second degree AV block, Mobitz type I   . Sepsis ([place]) 05/29/2017  . Sleep apnea    uses cpap    Past Surgical History:  Procedure Laterality Date  . APPENDECTOMY    . PACEMAKER IMPLANT N/A 06/24/2020   Procedure: PACEMAKER IMPLANT;  Surgeon: Vickie Epley, MD;  Location: [facility];  Service: Cardiovascular;  Laterality: N/A;    Family History  Problem Relation Age of Onset  . Kidney disease Mother   . Diabetes Sister   . Hyperlipidemia Sister   . Heart disease Other     Social History   Socioeconomic History  . Marital status: Single    Spouse name: Not on  file  . Number of children: Not on file  . Years of education: Not on file  . Highest education level: Not on file  Occupational History  . Not on file  Tobacco Use  . Smoking status: Former Smoker    Types: Cigarettes    Quit date: 04/24/2005    Years since quitting: 15.1  . Smokeless tobacco: Never Used  Vaping Use  . Vaping Use: Never used  Substance and Sexual Activity  . Alcohol use: Yes    Alcohol/week: 2.0 standard drinks    Types: 2 Cans of beer per week    Comment: every other day  . Drug use: No  . Sexual activity: Not on file  Other Topics Concern  . Not on file  Social History Narrative  . Not on file   Social Determinants of Health   Financial Resource Strain: Not on file  Food Insecurity: Not on file  Transportation Needs: Not on file  Physical Activity: Not on file  Stress: Not on file  Social Connections: Not on file  Intimate Partner Violence: Not on file    Outpatient Medications Prior to Visit  Medication Sig Dispense Refill  . Accu-Chek Softclix Lancets lancets Check blood sugar 3 times a day 100 each 12  . amLODipine (NORVASC) 10 MG tablet Take 1 tablet (10 mg total) by mouth daily. 90 tablet 3  . [START ON 06/29/2020] apixaban (ELIQUIS) 5  MG TABS tablet Take 1 tablet (5 mg total) by mouth 2 (two) times daily. 180 tablet 3  . aspirin 81 MG EC tablet Take 81 mg by mouth daily. Swallow whole.    [name] atorvastatin (LIPITOR) 80 MG tablet Take 80 mg by mouth daily.    . famotidine (PEPCID) 10 MG tablet Take 1 tablet (10 mg total) by mouth 2 (two) times daily. 60 tablet 0  . FLUoxetine (PROZAC) 20 MG capsule Take 1 capsule (20 mg total) by mouth daily. 30 capsule 3  . furosemide (LASIX) 40 MG tablet Take 1 tablet (40 mg total) by mouth daily. 30 tablet 11  . gabapentin (NEURONTIN) 300 MG capsule Take 1 capsule (300 mg total) by mouth in the morning, at noon, and at bedtime. 270 capsule 1  . glucose blood (ACCU-CHEK GUIDE) test strip Check blood sugar 3 times a  day 100 each 12  . HUMALOG 100 UNIT/ML injection Inject into the skin as needed.    [name] ibuprofen (ADVIL) 600 MG tablet Take 600 mg by mouth every 8 (eight) hours as needed for mild pain.    [name] losartan (COZAAR) 50 MG tablet Take 1 tablet (50 mg total) by mouth daily. 90 tablet 1  . metFORMIN (GLUCOPHAGE-XR) 750 MG 24 hr tablet Take 750 mg by mouth in the morning and at bedtime.    . vitamin B-12 (CYANOCOBALAMIN) 1000 MCG tablet Take 1,000 mcg by mouth daily.     No facility-administered medications prior to visit.    Allergies  Allergen Reactions  . Shellfish Allergy Hives    ROS Review of Systems    Objective:    Physical Exam  There were no vitals taken for this visit. Wt Readings from Last 3 Encounters:  06/24/20 (!) 335 lb (152 kg)  06/23/20 (!) 335 lb (152 kg)  06/11/20 (!) 335 lb 3.2 oz (152 kg)     Health Maintenance Due  Topic Date Due  . COVID-19 Vaccine (1) Never done  . TETANUS/TDAP  Never done  . COLONOSCOPY (Pts 45-101yr Insurance coverage will need to be confirmed)  Never done  . FOOT EXAM  08/30/2014  . OPHTHALMOLOGY EXAM  03/01/2015    There are no preventive care reminders to display for this patient.  Lab Results  Component Value Date   TSH 2.150 10/10/2019   Lab Results  Component Value Date   WBC 6.5 06/04/2020   HGB 12.4 (L) 06/04/2020   HCT 37.0 (L) 06/04/2020   MCV 98.9 06/04/2020   PLT 250 06/04/2020   Lab Results  Component Value Date   NA 132 (L) 06/04/2020   K 4.0 06/04/2020   CO2 22 06/04/2020   GLUCOSE 128 (H) 06/04/2020   BUN 18 06/04/2020   CREATININE 1.05 06/04/2020   BILITOT 0.3 06/02/2020   ALKPHOS 66 06/02/2020   AST 13 (L) 06/02/2020   ALT 16 06/02/2020   PROT 7.5 06/02/2020   ALBUMIN 3.6 06/02/2020   CALCIUM 9.6 06/04/2020   ANIONGAP 14 06/04/2020   Lab Results  Component Value Date   CHOL 92 (L) 04/08/2020   Lab Results  Component Value Date   HDL 40 04/08/2020   Lab Results  Component Value Date    LDLCALC 35 04/08/2020   Lab Results  Component Value Date   TRIG 87 04/08/2020   Lab Results  Component Value Date   CHOLHDL 2.3 04/08/2020   Lab Results  Component Value Date   HGBA1C 6.7 (A) 06/11/2020  Assessment & Plan:   Problem List Items Addressed This Visit   None     No orders of the defined types were placed in this encounter.   Follow-up: No follow-ups on file.    Dewayne Hatch, MD

## 2020-06-26 ENCOUNTER — Ambulatory Visit: Payer: Medicaid Other | Admitting: Emergency Medicine

## 2020-06-26 ENCOUNTER — Other Ambulatory Visit: Payer: Self-pay

## 2020-06-26 DIAGNOSIS — I495 Sick sinus syndrome: Secondary | ICD-10-CM

## 2020-06-26 NOTE — Progress Notes (Signed)
Patient seen today in the device clinic to have pressure dressing removed. Pressure dressing and transparent/non-adhesive dressing removed. No swelling noted to site. Patient denies any current complaints, states it has went better than he thought it would. Wound care and lifting restrictions discussed with patient.  Advised to call if he sees increased swelling, drainage, bleeding, redness, warmth, fever or chills. Verbalized understanding. Patient aware of wound recheck on 07/08/20 @ 2:15. Consent obtained to attach picture to medical record.

## 2020-06-26 NOTE — Patient Instructions (Signed)
Please call if you have any increased swelling, drainage, fever or chills.   [facility]  [phone]

## 2020-06-27 NOTE — Telephone Encounter (Signed)
Spoke with patient at [facility] on 06/11/2020 and again today. Explained again that [employer] is unable to process his request for PCS because he is enrolled in a [place]. He must contact his Health Plan for PCS. Also, gave patient number to [facility] [phone]) so he can discuss matter further with them. [name], BSN, RN-BC

## 2020-07-08 ENCOUNTER — Other Ambulatory Visit: Payer: Self-pay

## 2020-07-08 ENCOUNTER — Ambulatory Visit (INDEPENDENT_AMBULATORY_CARE_PROVIDER_SITE_OTHER): Payer: Medicaid Other | Admitting: Emergency Medicine

## 2020-07-08 DIAGNOSIS — I452 Bifascicular block: Secondary | ICD-10-CM | POA: Diagnosis not present

## 2020-07-08 LAB — CUP PACEART INCLINIC DEVICE CHECK
Battery Remaining Longevity: 64 mo
Battery Voltage: 3.04 V
Brady Statistic RA Percent Paced: 54 %
Brady Statistic RV Percent Paced: 99.12 %
Date Time Interrogation Session: 20220215122748
Implantable Lead Implant Date: 20220201
Implantable Lead Implant Date: 20220201
Implantable Lead Location: 753859
Implantable Lead Location: 753860
Implantable Pulse Generator Implant Date: 20220201
Lead Channel Impedance Value: 412.5 Ohm
Lead Channel Impedance Value: 512.5 Ohm
Lead Channel Pacing Threshold Amplitude: 0.75 V
Lead Channel Pacing Threshold Amplitude: 0.75 V
Lead Channel Pacing Threshold Amplitude: 1 V
Lead Channel Pacing Threshold Amplitude: 1 V
Lead Channel Pacing Threshold Pulse Width: 0.4 ms
Lead Channel Pacing Threshold Pulse Width: 0.4 ms
Lead Channel Pacing Threshold Pulse Width: 0.4 ms
Lead Channel Pacing Threshold Pulse Width: 0.4 ms
Lead Channel Sensing Intrinsic Amplitude: 10.3 mV
Lead Channel Sensing Intrinsic Amplitude: 2.3 mV
Lead Channel Setting Pacing Amplitude: 3.5 V
Lead Channel Setting Pacing Amplitude: 3.5 V
Lead Channel Setting Pacing Pulse Width: 0.4 ms
Lead Channel Setting Sensing Sensitivity: 2 mV
Pulse Gen Model: 2272
Pulse Gen Serial Number: 3895834

## 2020-07-08 NOTE — Progress Notes (Signed)
Wound check appointment. Steri-strips removed. Wound without redness or edema. Incision edges approximated, wound well healed. Normal device function. Thresholds, sensing, and impedances consistent with implant measurements. Device programmed at 3.5V for extra safety margin until 3 month visit. Histogram distribution appropriate for patient and level of activity. 3 mode switches (<1% burden), longesst episode 35 seconds with peak A/V rate of 229/70, +OAC.  No high ventricular rates noted. Patient educated about wound care, arm mobility, lifting restrictions. Patient is enrolled in remote monitoring, next scheduled check 09/23/20.  ROV with Dr. Quentin Ore 10/07/20

## 2020-07-10 ENCOUNTER — Other Ambulatory Visit: Payer: Self-pay

## 2020-07-10 ENCOUNTER — Ambulatory Visit: Payer: Medicaid Other | Admitting: Internal Medicine

## 2020-07-10 ENCOUNTER — Other Ambulatory Visit: Payer: Self-pay | Admitting: Internal Medicine

## 2020-07-10 ENCOUNTER — Encounter: Payer: Self-pay | Admitting: Internal Medicine

## 2020-07-10 VITALS — BP 123/66 | HR 60 | Temp 97.6°F | Wt 323.4 lb

## 2020-07-10 DIAGNOSIS — F322 Major depressive disorder, single episode, severe without psychotic features: Secondary | ICD-10-CM

## 2020-07-10 DIAGNOSIS — I441 Atrioventricular block, second degree: Secondary | ICD-10-CM

## 2020-07-10 DIAGNOSIS — M25559 Pain in unspecified hip: Secondary | ICD-10-CM

## 2020-07-10 DIAGNOSIS — I5032 Chronic diastolic (congestive) heart failure: Secondary | ICD-10-CM | POA: Diagnosis not present

## 2020-07-10 DIAGNOSIS — I10 Essential (primary) hypertension: Secondary | ICD-10-CM | POA: Diagnosis present

## 2020-07-10 MED ORDER — DULOXETINE HCL 20 MG PO CPEP: 20.0000 mg | ORAL_CAPSULE | Freq: Every day | ORAL | 3 refills | Status: DC

## 2020-07-10 NOTE — Telephone Encounter (Signed)
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 They will not pay until he enrolls. I will send refill request to [facility]  Thanks

## 2020-07-10 NOTE — Assessment & Plan Note (Signed)
#  Severe osteoarthritis of the hip: He continues to endorse hip pain with ambulation which sometimes limits his activities of daily living. He uses a rolling walker to get around. He has embarked on a weight loss adventure and states that he used to weigh 450 pounds but currently weighs 315 pounds. Hip x-rays were performed during his last clinic visit which showed DJD and osteoarthritis of the hip, left greater than right.  Plan: -Refer to orthopedic surgery for evaluation

## 2020-07-10 NOTE — Assessment & Plan Note (Signed)
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 He feels well and asymptomatic.

## 2020-07-10 NOTE — Patient Instructions (Signed)
Mr. Pruss,  It was a pleasure taking care of you here in the clinic today.  Here my recommendations: 1.  I am checking your blood work today and will call you with the results, continue the Lasix 2.  Please continue checking your weights daily 3.  I want you to wait another week before starting duloxetine 4.  I will refer you to orthopedic surgery  Take care! Dr. Eileen Stanford  Please call the internal medicine center clinic if you have any questions or concerns, we may be able to help and keep you from a long and expensive emergency room wait. Our clinic and after hours phone number is [phone], the best time to call is Monday through Friday 9 am to 4 pm but there is always someone available 24/7 if you have an emergency. If you need medication refills please notify your pharmacy one week in advance and they will send [place] a request.   If you have not gotten the COVID vaccine, I recommend doing so:  You may get it at your local [facility] or [facility] OR To schedule an appointment for a COVID vaccine or be added to the vaccine wait list: Go to [email or web address]   OR Go to [email or web address]                  OR Call [phone]                                     OR Call [phone] and select Option 2

## 2020-07-10 NOTE — Assessment & Plan Note (Addendum)
#  Heart failure with preserved ejection fraction: He was last seen in the clinic on June 11, 2020 after recent hospitalization for which he was complaining of shortness of breath and orthopnea.  During that visit, he was discovered to have about a 12 pound weight increase with bilateral pitting edema.  He was started on Lasix 40 mg daily.  Today, he states that he feels much better since starting Lasix and has been weighing himself consistently. His weight yesterday was 315 pounds (discharge weight from the hospital was 320 pounds). He denies lower extremity edema. Does report of some fatigue and feeling tired as he walked down a long hallway from the entrance of the hospital to the clinic.      Plan: -BMP today -Continue Lasix 40 mg daily -Continue daily weight, diet restriction -Consideration could be given to start an SGLT2 inhibitor in the future (diabetes is under control with last A1c of 6.7% on Metformin)

## 2020-07-10 NOTE — Progress Notes (Signed)
   CC: F/u HFpEF  HPI:  Mr.Breyer A Matus is a 55 y.o. 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  Please see problem based charting for further details.  Past Medical History:  Diagnosis Date  . Acute kidney injury ([place]) 09/01/2019  . Asthma   . Balanitis 08/09/2014  . Bifascicular block   . Cough 08/09/2014  . Diabetes mellitus without complication ([place])   . DM (diabetes mellitus) type 2, uncontrolled, without ketoacidosis 08/10/2013  . Dyslipidemia 03/01/2014  . Financial difficulties 08/09/2014  . GERD (gastroesophageal reflux disease)   . Health care maintenance 03/01/2014  . Hypertension   . Hypertensive urgency 05/29/2017  . Hypomagnesemia 05/29/2017  . Insomnia 08/29/2013  . Morbid obesity ([place]) 08/29/2013  . Near syncope 05/29/2017  . Odontogenic infection of jaw 05/29/2017  . PAC (premature atrial contraction)   . PAF (paroxysmal atrial fibrillation) ([place])   . Pressure injury of skin 09/02/2019  . PVCs (premature ventricular contractions)   . Right shoulder pain 08/29/2013  . Second degree AV block, Mobitz type I   . Sepsis ([place]) 05/29/2017  . Sleep apnea    uses cpap   Review of Systems:  As per HPI  Physical Exam:  Vitals:   07/10/20 0847  BP: 123/66  Pulse: 60  Temp: 97.6 F (36.4 C)  TempSrc: Oral  SpO2: 100%  Weight: (!) 323 lb 6.4 oz (146.7 kg)   Physical Exam Vitals and nursing note reviewed.  Constitutional:      Appearance: He is obese.  HENT:     Head: Normocephalic and atraumatic.  Eyes:     Conjunctiva/sclera: Conjunctivae normal.  Cardiovascular:     Rate and Rhythm: Normal rate.     Heart sounds: Normal heart sounds.  Pulmonary:     Breath sounds: Normal breath sounds.  Musculoskeletal:     Right lower leg: No edema.     Left lower leg: No edema.  Neurological:      Mental Status: He is alert.     Assessment & Plan:   See Encounters Tab for problem based charting.  Patient discussed with Dr. Dareen Piano

## 2020-07-10 NOTE — Assessment & Plan Note (Signed)
#  Severe depression: Was previously on fluoxetine 20 mg but will switch to duloxetine 20 mg after a 2-week washout which will help with his depression and chronic pain.

## 2020-07-11 LAB — BMP8+ANION GAP
Anion Gap: 19 mmol/L — ABNORMAL HIGH (ref 10.0–18.0)
BUN/Creatinine Ratio: 23 — ABNORMAL HIGH (ref 9–20)
BUN: 25 mg/dL — ABNORMAL HIGH (ref 6–24)
CO2: 17 mmol/L — ABNORMAL LOW (ref 20–29)
Calcium: 9.7 mg/dL (ref 8.7–10.2)
Chloride: 99 mmol/L (ref 96–106)
Creatinine, Ser: 1.1 mg/dL (ref 0.76–1.27)
GFR calc Af Amer: 86 mL/min/{1.73_m2} (ref >59)
GFR calc non Af Amer: 75 mL/min/{1.73_m2} (ref >59)
Glucose: 135 mg/dL — ABNORMAL HIGH (ref 65–99)
Potassium: 4.9 mmol/L (ref 3.5–5.2)
Sodium: 135 mmol/L (ref 134–144)

## 2020-07-14 ENCOUNTER — Other Ambulatory Visit: Payer: Self-pay | Admitting: Internal Medicine

## 2020-07-14 DIAGNOSIS — E872 Acidosis, unspecified: Secondary | ICD-10-CM

## 2020-07-14 DIAGNOSIS — E1110 Type 2 diabetes mellitus with ketoacidosis without coma: Secondary | ICD-10-CM

## 2020-07-14 NOTE — Progress Notes (Signed)
Internal Medicine Clinic Attending  Case discussed with Dr. Agyei  At the time of the visit.  We reviewed the resident's history and exam and pertinent patient test results.  I agree with the assessment, diagnosis, and plan of care documented in the resident's note.  

## 2020-07-15 ENCOUNTER — Other Ambulatory Visit (INDEPENDENT_AMBULATORY_CARE_PROVIDER_SITE_OTHER): Payer: Medicaid Other

## 2020-07-15 ENCOUNTER — Telehealth: Payer: Self-pay | Admitting: *Deleted

## 2020-07-15 ENCOUNTER — Other Ambulatory Visit: Payer: Self-pay | Admitting: Internal Medicine

## 2020-07-15 DIAGNOSIS — E1110 Type 2 diabetes mellitus with ketoacidosis without coma: Secondary | ICD-10-CM

## 2020-07-15 DIAGNOSIS — E872 Acidosis, unspecified: Secondary | ICD-10-CM

## 2020-07-15 DIAGNOSIS — I10 Essential (primary) hypertension: Secondary | ICD-10-CM

## 2020-07-15 LAB — URINALYSIS, ROUTINE W REFLEX MICROSCOPIC
Bilirubin Urine: NEGATIVE
Glucose, UA: NEGATIVE mg/dL
Hgb urine dipstick: NEGATIVE
Ketones, ur: NEGATIVE mg/dL
Leukocytes,Ua: NEGATIVE
Nitrite: NEGATIVE
Protein, ur: NEGATIVE mg/dL
Specific Gravity, Urine: 1.01 (ref 1.005–1.030)
pH: 5 (ref 5.0–8.0)

## 2020-07-15 LAB — CBC WITH DIFFERENTIAL/PLATELET
Abs Immature Granulocytes: 0.01 10*3/uL (ref 0.00–0.07)
Basophils Absolute: 0 10*3/uL (ref 0.0–0.1)
Basophils Relative: 0 %
Eosinophils Absolute: 0.4 10*3/uL (ref 0.0–0.5)
Eosinophils Relative: 5 %
HCT: 41.2 % (ref 39.0–52.0)
Hemoglobin: 12.8 g/dL — ABNORMAL LOW (ref 13.0–17.0)
Immature Granulocytes: 0 %
Lymphocytes Relative: 13 %
Lymphs Abs: 1 10*3/uL (ref 0.7–4.0)
MCH: 30.8 pg (ref 26.0–34.0)
MCHC: 31.1 g/dL (ref 30.0–36.0)
MCV: 99.3 fL (ref 80.0–100.0)
Monocytes Absolute: 1.1 10*3/uL — ABNORMAL HIGH (ref 0.1–1.0)
Monocytes Relative: 14 %
Neutro Abs: 5.1 10*3/uL (ref 1.7–7.7)
Neutrophils Relative %: 68 %
Platelets: 248 10*3/uL (ref 150–400)
RBC: 4.15 MIL/uL — ABNORMAL LOW (ref 4.22–5.81)
RDW: 12.4 % (ref 11.5–15.5)
WBC: 7.6 10*3/uL (ref 4.0–10.5)
nRBC: 0 % (ref 0.0–0.2)

## 2020-07-15 LAB — BASIC METABOLIC PANEL
Anion gap: 12 (ref 5–15)
BUN: 18 mg/dL (ref 6–20)
CO2: 23 mmol/L (ref 22–32)
Calcium: 9.9 mg/dL (ref 8.9–10.3)
Chloride: 101 mmol/L (ref 98–111)
Creatinine, Ser: 1.26 mg/dL — ABNORMAL HIGH (ref 0.61–1.24)
GFR, Estimated: >60 mL/min (ref >60)
Glucose, Bld: 201 mg/dL — ABNORMAL HIGH (ref 70–99)
Potassium: 4.2 mmol/L (ref 3.5–5.1)
Sodium: 136 mmol/L (ref 135–145)

## 2020-07-15 LAB — LACTIC ACID, PLASMA: Lactic Acid, Venous: 2.1 mmol/L (ref 0.5–1.9)

## 2020-07-15 NOTE — Telephone Encounter (Signed)
Allen Gould with [facility] lab called in to report critical lab value for lactic acid at 2.1. Reported in person to Dr. Eileen Stanford at 1047. He was already aware. Confirmed with Dr. Eileen Stanford that no further blood work is needed at this time and BMP does not need to be run as STAT. [name], BSN, RN-BC

## 2020-07-15 NOTE — Telephone Encounter (Signed)
Pharmacy changed in [place].  TC to patient to inform him he can call [facility] and request his RX's be transferred from [facility].  Pt states he has been trying to do this since last Friday and they haven't been able to reach [facility].  Will send refill request to MD. Laurence Compton, RN,BSN

## 2020-07-15 NOTE — Addendum Note (Signed)
Addended by: Truddie Crumble on: 07/15/2020 02:25 PM   Modules accepted: Orders

## 2020-07-15 NOTE — Telephone Encounter (Signed)
Refill Request- Pt requesting to change his  Pharmacy from      [facility] ([address]), [place] [facility] [place] (Ph: [phone])  To   [facility] in [place], [place] in: [facility] Address: [address], [place], [place] [address]  Phone: [phone]   amLODipine (NORVASC) 10 MG tablet  losartan (COZAAR) 50 MG tablet

## 2020-07-15 NOTE — Addendum Note (Signed)
Addended by: Yvette Rack on: 07/15/2020 10:55 AM   Modules accepted: Orders

## 2020-07-16 ENCOUNTER — Telehealth: Payer: Self-pay

## 2020-07-16 ENCOUNTER — Telehealth: Payer: Self-pay | Admitting: *Deleted

## 2020-07-16 ENCOUNTER — Other Ambulatory Visit: Payer: Self-pay | Admitting: Internal Medicine

## 2020-07-16 DIAGNOSIS — I10 Essential (primary) hypertension: Secondary | ICD-10-CM

## 2020-07-16 MED ORDER — GABAPENTIN 300 MG PO CAPS: 300.0000 mg | ORAL_CAPSULE | Freq: Three times a day (TID) | ORAL | 1 refills | Status: DC

## 2020-07-16 MED ORDER — AMLODIPINE BESYLATE 10 MG PO TABS: 10.0000 mg | ORAL_TABLET | Freq: Every day | ORAL | 3 refills | Status: DC

## 2020-07-16 MED ORDER — LOSARTAN POTASSIUM 50 MG PO TABS: 50.0000 mg | ORAL_TABLET | Freq: Every day | ORAL | 1 refills | Status: DC

## 2020-07-16 NOTE — Telephone Encounter (Signed)
Call from [facility] requesting surgical clearance; I asked if they would fax use the form, stated she will. Fax # given.

## 2020-07-16 NOTE — Telephone Encounter (Signed)
I faxed completed form for PCS to [facility] phone number is [phone] [place], [place] C2/23/20223:14 PM

## 2020-07-17 ENCOUNTER — Other Ambulatory Visit: Payer: Self-pay

## 2020-07-17 ENCOUNTER — Ambulatory Visit (INDEPENDENT_AMBULATORY_CARE_PROVIDER_SITE_OTHER): Payer: Medicaid Other

## 2020-07-17 ENCOUNTER — Encounter: Payer: Self-pay | Admitting: Orthopaedic Surgery

## 2020-07-17 ENCOUNTER — Ambulatory Visit: Payer: Medicaid Other | Admitting: Orthopaedic Surgery

## 2020-07-17 VITALS — Ht 70.0 in | Wt 316.0 lb

## 2020-07-17 DIAGNOSIS — M160 Bilateral primary osteoarthritis of hip: Secondary | ICD-10-CM

## 2020-07-17 DIAGNOSIS — Z6841 Body Mass Index (BMI) 40.0 and over, adult: Secondary | ICD-10-CM

## 2020-07-17 NOTE — Progress Notes (Signed)
Office Visit Note   Patient: Allen Gould           Date of Birth: 1965/06/15           MRN: [id number] Visit Date: 07/17/2020              Requested by: Aldine Contes, MD [address], [place],  [place] [address] PCP: [name], MD   Assessment & Plan: Visit Diagnoses:  1. Primary osteoarthritis of both hips   2. Body mass index 45.0-49.9, adult ([place])   3. Morbid obesity ([place])     Plan: Impression is advanced degenerative joint disease bilateral hips left greater than right.  We discussed various treatment options to include cortisone injection and total hip arthroplasty.  He currently has a BMI of 45 and will need to get to a weight of 275 pounds in order to get to under a BMI of 40 to be able to proceed with hip arthroplasty.  He would like to try cortisone injections for now.  He will follow up with Dr. Junius Roads for this.  The patient meets the AMA guidelines for Morbid (severe) obesity with a BMI > 40.0 and I have recommended weight loss.  Follow-Up Instructions: Return if symptoms worsen or fail to improve.   Orders:  Orders Placed This Encounter  Procedures  . XR Lumbar Spine 2-3 Views   No orders of the defined types were placed in this encounter.     Procedures: No procedures performed   Clinical Data: No additional findings.   Subjective: Chief Complaint  Patient presents with  . Left Hip - Pain  . Right Hip - Pain    HPI patient is a very pleasant 55 year old gentleman who comes in today with bilateral hip pain left greater than right for the past several years.  No known injury or change in activity.  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  The pain he has is to the groin.  He has increased pain with ambulation as well as with flexion of the hip.  He has been taking Tylenol and ibuprofen without significant relief.  He has not  previously had cortisone injections to either hip.  He has been to physical therapy for this without relief.  Of note, he is a type II diabetic with last hemoglobin A1c of 6.7 and has A. fib on Eliquis.  Review of Systems as detailed in HPI.  All others reviewed and are negative.   Objective: Vital Signs: Ht 5' 10"  (1.778 m)   Wt (!) 316 lb (143.3 kg)   BMI 45.34 kg/m   Physical Exam well-developed well-nourished gentleman in no acute distress.  Alert and oriented x3.  Ortho Exam bilateral hip exam shows a markedly positive logroll and FADIR.  He is neurovascularly intact distally.  Specialty Comments:  No specialty comments available.  Imaging: XR Lumbar Spine 2-3 Views  Result Date: 07/17/2020 Moderate degenerative changes L4-5 and L5-S1    PMFS History: Patient Active Problem List   Diagnosis Date Noted  . Hip pain 2/2 osteoarthritis 06/12/2020  . Tachycardia-bradycardia syndrome ([place]) 06/05/2020  . (HFpEF) heart failure with preserved ejection fraction ([employer]) 06/02/2020  . Severe depression ([place]) 04/24/2020  . Physical deconditioning 04/24/2020  . Cellulitis of scrotum 04/24/2020  . Weakness 10/10/2019  . Metabolic acidosis   . Second degree AV block, Mobitz type I   .  Bifascicular block   . PAC (premature atrial contraction)   . PVCs (premature ventricular contractions)   . PAF (paroxysmal atrial fibrillation) ([place])   . Acute kidney injury ([place]) 09/01/2019  . Hypomagnesemia 05/29/2017  . Financial difficulties 08/09/2014  . Health care maintenance 03/01/2014  . Dyslipidemia 03/01/2014  . Morbid obesity ([place]) 08/29/2013  . Right shoulder pain 08/29/2013  . Insomnia 08/29/2013  . DM (diabetes mellitus) type 2, uncontrolled, without ketoacidosis 08/10/2013  . Hypertension   . Asthma   . GERD (gastroesophageal reflux disease)    Past Medical History:  Diagnosis Date  . Acute kidney injury ([place]) 09/01/2019  . Asthma   . Balanitis 08/09/2014  . Bifascicular  block   . Cough 08/09/2014  . Diabetes mellitus without complication ([place])   . DM (diabetes mellitus) type 2, uncontrolled, without ketoacidosis 08/10/2013  . Dyslipidemia 03/01/2014  . Financial difficulties 08/09/2014  . GERD (gastroesophageal reflux disease)   . Health care maintenance 03/01/2014  . Hypertension   . Hypertensive urgency 05/29/2017  . Hypomagnesemia 05/29/2017  . Insomnia 08/29/2013  . Morbid obesity ([place]) 08/29/2013  . Near syncope 05/29/2017  . Odontogenic infection of jaw 05/29/2017  . PAC (premature atrial contraction)   . PAF (paroxysmal atrial fibrillation) ([place])   . Pressure injury of skin 09/02/2019  . PVCs (premature ventricular contractions)   . Right shoulder pain 08/29/2013  . Second degree AV block, Mobitz type I   . Sepsis ([place]) 05/29/2017  . Sleep apnea    uses cpap    Family History  Problem Relation Age of Onset  . Kidney disease Mother   . Diabetes Sister   . Hyperlipidemia Sister   . Heart disease Other     Past Surgical History:  Procedure Laterality Date  . APPENDECTOMY    . PACEMAKER IMPLANT N/A 06/24/2020   Procedure: PACEMAKER IMPLANT;  Surgeon: Vickie Epley, MD;  Location: [facility];  Service: Cardiovascular;  Laterality: N/A;   Social History   Occupational History  . Not on file  Tobacco Use  . Smoking status: Former Smoker    Types: Cigarettes    Quit date: 04/24/2005    Years since quitting: 15.2  . Smokeless tobacco: Never Used  Vaping Use  . Vaping Use: Never used  Substance and Sexual Activity  . Alcohol use: Yes    Alcohol/week: 2.0 standard drinks    Types: 2 Cans of beer per week    Comment: every other day  . Drug use: No  . Sexual activity: Not on file

## 2020-07-22 ENCOUNTER — Ambulatory Visit (INDEPENDENT_AMBULATORY_CARE_PROVIDER_SITE_OTHER): Payer: Medicaid Other | Admitting: Family Medicine

## 2020-07-22 ENCOUNTER — Encounter: Payer: Self-pay | Admitting: Family Medicine

## 2020-07-22 ENCOUNTER — Ambulatory Visit: Payer: Self-pay

## 2020-07-22 ENCOUNTER — Other Ambulatory Visit (INDEPENDENT_AMBULATORY_CARE_PROVIDER_SITE_OTHER): Payer: Medicaid Other

## 2020-07-22 ENCOUNTER — Other Ambulatory Visit: Payer: Self-pay

## 2020-07-22 ENCOUNTER — Other Ambulatory Visit: Payer: Self-pay | Admitting: *Deleted

## 2020-07-22 DIAGNOSIS — E872 Acidosis, unspecified: Secondary | ICD-10-CM

## 2020-07-22 DIAGNOSIS — M160 Bilateral primary osteoarthritis of hip: Secondary | ICD-10-CM

## 2020-07-22 LAB — LACTIC ACID, PLASMA: Lactic Acid, Venous: 1.9 mmol/L (ref 0.5–1.9)

## 2020-07-22 NOTE — Progress Notes (Signed)
Subjective: Patient is here for ultrasound-guided intra-articular bilateral hip injection.   Pain for 3 years due to DJD.  Objective:  Pain with IR bilaterally.  Procedure: Ultrasound guided injection is preferred based studies that show increased duration, increased effect, greater accuracy, decreased procedural pain, increased response rate, and decreased cost with ultrasound guided versus blind injection.   Verbal informed consent obtained.  Time-out conducted.  Noted no overlying erythema, induration, or other signs of local infection. Ultrasound-guided bilateral hip injection: After sterile prep with Betadine, injected 4 cc 0.25% bupivacaine without epinephrine and 6 mg betamethasone using a 22-gauge spinal needle, passing the needle through the iliofemoral ligament into the femoral head/neck junction.  Injectate seen filling both joint capsules.  Good immediate relief on the left, not as good on the right.

## 2020-07-28 ENCOUNTER — Encounter: Payer: Self-pay | Admitting: Internal Medicine

## 2020-07-28 ENCOUNTER — Other Ambulatory Visit: Payer: Self-pay

## 2020-07-28 ENCOUNTER — Ambulatory Visit: Payer: Medicaid Other | Admitting: Internal Medicine

## 2020-07-28 DIAGNOSIS — Z01818 Encounter for other preprocedural examination: Secondary | ICD-10-CM

## 2020-07-28 DIAGNOSIS — I10 Essential (primary) hypertension: Secondary | ICD-10-CM

## 2020-07-28 MED ORDER — ATORVASTATIN CALCIUM 80 MG PO TABS: 80.0000 mg | ORAL_TABLET | Freq: Every day | ORAL | 3 refills | Status: DC

## 2020-07-28 MED ORDER — FAMOTIDINE 10 MG PO TABS: 10.0000 mg | ORAL_TABLET | Freq: Two times a day (BID) | ORAL | 0 refills | Status: DC

## 2020-07-28 NOTE — Patient Instructions (Signed)
Please ask your cardiologist to do a pre-op evaluation from their perspective. Otherwise, I will see you in 1-2 weeks at which time we can evaluate your other medical conditions.

## 2020-07-28 NOTE — Progress Notes (Signed)
CC: pre-op evaluation  HPI:  [name] is a 55 y.o. male who presents for pre-operative evaluation. Please refer to problem based charting for details, assessment and plan   Past Medical History:  Diagnosis Date  . Acute kidney injury (HCC) 09/01/2019  . Asthma   . Balanitis 08/09/2014  . Bifascicular block   . Cough 08/09/2014  . Diabetes mellitus without complication (HCC)   . DM (diabetes mellitus) type 2, uncontrolled, without ketoacidosis 08/10/2013  . Dyslipidemia 03/01/2014  . Financial difficulties 08/09/2014  . GERD (gastroesophageal reflux disease)   . Health care maintenance 03/01/2014  . Hypertension   . Hypertensive urgency 05/29/2017  . Hypomagnesemia 05/29/2017  . Insomnia 08/29/2013  . Morbid obesity (HCC) 08/29/2013  . Near syncope 05/29/2017  . Odontogenic infection of jaw 05/29/2017  . PAC (premature atrial contraction)   . PAF (paroxysmal atrial fibrillation) (HCC)   . Pressure injury of skin 09/02/2019  . PVCs (premature ventricular contractions)   . Right shoulder pain 08/29/2013  . Second degree AV block, Mobitz type I   . Sepsis (HCC) 05/29/2017  . Sleep apnea    uses cpap    Review of Systems:  Review of Systems - General ROS: negative for - chills or fever Respiratory ROS: no cough, shortness of breath, or wheezing Cardiovascular ROS: no chest pain or dyspnea on exertion Gastrointestinal ROS: no abdominal pain, change in bowel habits, or black or bloody stools Musculoskeletal ROS: positive for - joint pain negative for - muscular weakness Neurological ROS: negative   Physical Exam:  Vitals:   07/28/20 1322  BP: (!) 146/94  Pulse: 72  Temp: 98.4 F (36.9 C)  TempSrc: Oral  Weight: (!) 314 lb 11.2 oz (142.7 kg)    GENERAL: well appearing, in no apparent distress HEENT: no conjunctival injection. Nares patent.  CARDIAC: heart regular rate and rhythm, no peripheral edema appreciated PULMONARY: lung sounds clear to auscultation ABDOMEN: bowel  sounds active.  SKIN: no rash or lesion on limited exam NEURO: CN II-XII grossly intact   Assessment & Plan:   Preoperative risk assessment . Assessment: Request by Dr. Arita Miss for preoperative clearance prior to scrotal cyst exision. Surgery will be scheduled after completion of this assessment..   ID - Patient denies recent illness, fevers, chills, cough, congestion, sick contacts.  Functional capacity - admits to ability to take care of self, such as eat, dress or use the toilet. He is not able to walk up a flight of steps or a hill. He does not have the ability to do heavy work around the house such as scrubbing floors or lifting or moving heavy furniture. He does not have the ability to participate in strenuous sports such as swimming, singles tennis, football, basketball, and skiing. Cardiovascular status - He has heart failure, hypertension, PAF, and  Tachy-brady syndrome s/p pacemaker implantation 2/1. Denies chest pain, shortness of breath, palpitations, tachycardia.  Anesthesia - Patient denies past history of adverse reaction to anesthesia, denies known family history of reaction to anesthesia.  Seizure history - Patient denies prior history of seizure disorder.  Pulmonary function - Patient does not not have ongoing tobacco use. The patient currently denies acute pulmonary complaints including, but not limited to shortness of breath, difficulty breathing, cough.  Hematologic status - patient denies prior easy bruising, easy bleeding. No known bleeding disorders. denies have history of anemia, with baseline hemoglobin of 13. Cardiac functional status : <4 METS (Can take care of self, such as eat,  dress, or use the toilet (1 MET) Using the [facility] preoperative cardiac risk calculator, the patient's estimated risk probability for perioperative MI or cardiac arrest is 0.6%.  The preoperative mortality predictor score is 7 points = 0.6% risk of perioperative mortality  Plan:  -plan to proceed  with surgery. He has a cardiology appointment 3/8--appreciate their input -recommend holding eliquis 5-7 days preceding the procedure -if no anesthesia is required, do not recommend pre-operative medication adjustments.   Hypertension Current medications: amlodipine 10mg  daily, lasix 40mg  daily, losartan 50mg  daily Blood pressure was initially above goal in the office however improved to 130/86 on recheck. Plan -continue current management for now -he will follow up with me in 1-2w for re-evaluation  Morbid obesity (BMI 45) Wt is down 335lb>314lb since the beginning of February. He has been working on increasing physical activity and diet adherence. I congratulated him on this 21# weight loss.  I considered semagluamide to assist with weight loss and glycemic control however would consider his risk for metabolic acidosis too high at this time. Plan -continue current management with lifestyle modifications   Patient is in agreement with the plan and endorses no further questions at this time.  Patient discussed with Dr. Cleda Daub  Elige Radon, MD Internal Medicine Resident PGY-2 [name] Pager: [phone] 07/29/2020 9:21 AM

## 2020-07-29 ENCOUNTER — Ambulatory Visit: Payer: Medicaid Other | Admitting: Cardiology

## 2020-07-29 ENCOUNTER — Encounter: Payer: Self-pay | Admitting: Internal Medicine

## 2020-07-29 ENCOUNTER — Encounter: Payer: Self-pay | Admitting: Cardiology

## 2020-07-29 VITALS — BP 124/78 | HR 79 | Ht 70.0 in | Wt 314.0 lb

## 2020-07-29 DIAGNOSIS — I10 Essential (primary) hypertension: Secondary | ICD-10-CM

## 2020-07-29 DIAGNOSIS — I441 Atrioventricular block, second degree: Secondary | ICD-10-CM

## 2020-07-29 DIAGNOSIS — G4733 Obstructive sleep apnea (adult) (pediatric): Secondary | ICD-10-CM

## 2020-07-29 DIAGNOSIS — I480 Paroxysmal atrial fibrillation: Secondary | ICD-10-CM

## 2020-07-29 DIAGNOSIS — Z01818 Encounter for other preprocedural examination: Secondary | ICD-10-CM | POA: Insufficient documentation

## 2020-07-29 DIAGNOSIS — I2510 Atherosclerotic heart disease of native coronary artery without angina pectoris: Secondary | ICD-10-CM

## 2020-07-29 DIAGNOSIS — E7800 Pure hypercholesterolemia, unspecified: Secondary | ICD-10-CM

## 2020-07-29 DIAGNOSIS — I77810 Thoracic aortic ectasia: Secondary | ICD-10-CM | POA: Insufficient documentation

## 2020-07-29 DIAGNOSIS — I350 Nonrheumatic aortic (valve) stenosis: Secondary | ICD-10-CM | POA: Insufficient documentation

## 2020-07-29 NOTE — Progress Notes (Signed)
Cardiology Office Note:    Date:  07/29/2020   ID:  [name], DOB Feb 08, 1966, MRN [id number]  PCP:  [name], MD  Cardiologist:  Fransico Him, MD   Electrophysiologist:  Vickie Epley, MD   Referring MD: [name], MD   Chief Complaint:  Coronary Artery Disease, Hypertension, Atrial Fibrillation, Hyperlipidemia, and Congestive Heart Failure    Patient Profile:    Allen Gould is a 55 y.o. male with:   Coronary artery disease  Mild to mod non-obs Dz by cath at [facility] in 2018   Paroxysmal atrial fibrillation  CHA2DS2-VASc=3 (HTN, Diab, CAD)   admx in 11/2018 at [facility] w scrotal abscess >> AF w RVR  Prior anticoagulation with Apixaban, Rivaroxaban >> not continued after 4/21 admit due to hx of ETOH abuse, non-adherence   RBBB, LAFB  High grade AVB s/p PPM followed in device clinic  1st degree AVB  Alcohol abuse  Morbid obesity  Hypertension   Diabetes mellitus   Macrocytic anemia   Prior CV studies: Echocardiogram 10/11/2019 EF 60-65, no RWMA, moderate LVH, normal RVSF, mild LAE, severe calcification of anterior mitral valve leaflet, trivial MR, mild MS, mild AS (mean gradient 12 mmHg), borderline dilatation of aortic root (39 mm)  Echocardiogram 11/30/2018 ([facility]) EF 60-65, BAE, mod LVH  Echocardiogram 07/01/2018 ([facility]) EF 60-65  Echocardiogram 05/30/2017 Moderate concentric LVH, EF 55-60, normal wall motion, GR 1 DD, aortic root 39 mm, mild MR  Cardiac catheterization 10/27/16 ([facility]) Report states:  "mild to mod non-obstructive dz"  Echocardiogram 10/25/16 ([facility]) EF 55-60, mod LVH, Gr 1 DD, mild LAE   History of Present Illness:    Allen Gould was initially evaluated by me in 4/21 during an admission for a UTI.  He was noted to have 2nd degree AVB Type I.  There was no indication for pacemaker.  He had not been adherent with anticoagulation for hx of PAF.  It was decided to not resume anticoagulation due to hx of ETOH abuse and non-adherence  which can result in increased bleeding risk.  He was then admitted in May with high anion gap metabolic acidosis secondary to starvation ketosis.  He had fallen and was helped to his recliner by EMS but had not been able to move from his recliner for several days.  He had very little to eat and once he ate, his anion gap resolved.  1st degree AVB was noted.  He was noted seen by Cardiology.  An echocardiogram demonstrated normal EF, mild MS, mild AS and mod LVH.  He was last seen in August by Richardson Dopp, PA and resided in [facility] assisted living facility.  He was doing well at that time. His BP was poorly controlled at last OV and his Losartan was increased. Since then he has moved back home.    He is here today for followup and is doing well.  He tells me that he has been having a pain over his device with a pain down the inside of his left arm since his pacemaker implant.  This is worse when he lays in bed and moves his arm and feels a pulling sensation.  He has not had any arm swelling.  He has chronic DOE that is stable and unchanged.  He denies any chest pain or pressure, PND, orthopnea, LE edema, dizziness, palpitations or syncope. He is compliant with his meds and is tolerating meds with no SE.    Past Medical History:  Diagnosis Date  .  Acute kidney injury ([place]) 09/01/2019  . Asthma   . Balanitis 08/09/2014  . Bifascicular block   . Cough 08/09/2014  . Diabetes mellitus without complication ([place])   . DM (diabetes mellitus) type 2, uncontrolled, without ketoacidosis 08/10/2013  . Dyslipidemia 03/01/2014  . Financial difficulties 08/09/2014  . GERD (gastroesophageal reflux disease)   . Health care maintenance 03/01/2014  . Hypertension   . Hypertensive urgency 05/29/2017  . Hypomagnesemia 05/29/2017  . Insomnia 08/29/2013  . Morbid obesity ([place]) 08/29/2013  . Near syncope 05/29/2017  . Odontogenic infection of jaw 05/29/2017  . PAC (premature atrial contraction)   . PAF (paroxysmal atrial  fibrillation) ([place])   . Pressure injury of skin 09/02/2019  . PVCs (premature ventricular contractions)   . Right shoulder pain 08/29/2013  . Second degree AV block, Mobitz type I   . Sepsis ([place]) 05/29/2017  . Sleep apnea    uses cpap    Current Medications: Current Meds  Medication Sig  . Accu-Chek Softclix Lancets lancets Check blood sugar 3 times a day  . amLODipine (NORVASC) 10 MG tablet Take 1 tablet (10 mg total) by mouth daily.  [name] apixaban (ELIQUIS) 5 MG TABS tablet Take 1 tablet (5 mg total) by mouth 2 (two) times daily.  [name] aspirin 81 MG EC tablet Take 81 mg by mouth daily. Swallow whole.  [name] atorvastatin (LIPITOR) 80 MG tablet Take 1 tablet (80 mg total) by mouth daily.  . DULoxetine (CYMBALTA) 20 MG capsule TAKE 1 CAPSULE BY MOUTH ONCE DAILY  . famotidine (PEPCID) 10 MG tablet Take 1 tablet (10 mg total) by mouth 2 (two) times daily.  . furosemide (LASIX) 40 MG tablet Take 1 tablet (40 mg total) by mouth daily.  [name] gabapentin (NEURONTIN) 300 MG capsule Take 1 capsule (300 mg total) by mouth in the morning, at noon, and at bedtime.  [name] glucose blood (ACCU-CHEK GUIDE) test strip Check blood sugar 3 times a day  . HUMALOG 100 UNIT/ML injection Inject into the skin as needed.  [name] ibuprofen (ADVIL) 600 MG tablet Take 600 mg by mouth every 8 (eight) hours as needed for mild pain.  [name] losartan (COZAAR) 50 MG tablet Take 1 tablet (50 mg total) by mouth daily.  . metFORMIN (GLUCOPHAGE-XR) 750 MG 24 hr tablet Take 750 mg by mouth in the morning and at bedtime.  . vitamin B-12 (CYANOCOBALAMIN) 1000 MCG tablet Take 1,000 mcg by mouth daily.     Allergies:   Shellfish allergy   Social History   Tobacco Use  . Smoking status: Former Smoker    Types: Cigarettes    Quit date: 04/24/2005    Years since quitting: 15.2  . Smokeless tobacco: Never Used  Vaping Use  . Vaping Use: Never used  Substance Use Topics  . Alcohol use: Yes    Alcohol/week: 2.0 standard drinks    Types: 2 Cans of beer  per week    Comment: every other day  . Drug use: No     Family Hx: The patient's family history includes Diabetes in his sister; Heart disease in an other family member; Hyperlipidemia in his sister; Kidney disease in his mother.  ROS   EKGs/Labs/Other Test Reviewed:    EKG:  EKG is not ordered today.   Recent Labs: 10/10/2019: TSH 2.150 10/11/2019: Magnesium 1.7 06/02/2020: ALT 16; B Natriuretic Peptide 93.9 07/15/2020: BUN 18; Creatinine, Ser 1.26; Hemoglobin 12.8; Platelets 248; Potassium 4.2; Sodium 136   Recent Lipid Panel Lab Results  Component Value Date/Time   CHOL 92 (L) 04/08/2020 08:17 AM   TRIG 87 04/08/2020 08:17 AM   HDL 40 04/08/2020 08:17 AM   CHOLHDL 2.3 04/08/2020 08:17 AM   CHOLHDL 6.0 02/28/2014 03:08 PM   LDLCALC 35 04/08/2020 08:17 AM   LDLDIRECT 108 (H) 03/01/2014 04:09 AM    Physical Exam:    VS:  BP 124/78   Pulse 79   Ht 5\' 10"  (1.778 m)   Wt (!) 314 lb (142.4 kg)   SpO2 97%   BMI 45.05 kg/m     Wt Readings from Last 3 Encounters:  07/29/20 (!) 314 lb (142.4 kg)  07/28/20 (!) 314 lb 11.2 oz (142.7 kg)  07/17/20 (!) 316 lb (143.3 kg)    GEN: Well nourished, well developed in no acute distress HEENT: Normal NECK: No JVD; No carotid bruits LYMPHATICS: No lymphadenopathy CARDIAC:RRR, no murmurs, rubs, gallops RESPIRATORY:  Clear to auscultation without rales, wheezing or rhonchi  ABDOMEN: Soft, non-tender, non-distended MUSCULOSKELETAL:  No edema; No deformity  SKIN: Warm and dry NEUROLOGIC:  Alert and oriented x 3 PSYCHIATRIC:  Normal affect    ASSESSMENT & PLAN:    1. Bifascicular block/Second degree AV block, Mobitz type I -s/p PPM by Dr. Quentin Ore 06/24/2020 -followed in device clinic  2. Coronary artery disease involving native coronary artery of native heart without angina pectoris -he has a hx of nonobstructive disease by cardiac catheterization in 2018.   -he has not had any anginal symptoms>>he has had some funny feelings in  his chest over his pacer device and left arm since his pacer that he notices more when in bed at night and movements of his arm -Continue aspirin, statin.  3. PAF (paroxysmal atrial fibrillation) ([place]) -he is maintaining NSR on exam today with no palpitations   -CHA2DS2-VASc Score = 4  This indicates a 4.8% annual risk of stroke. -recent pacer check showed 3 mode switches for AT/AFib/Aflutter that only lasted 30 seconds -continue Apixaban 5mg  BID>>he denies any bleeding problems on the DOAC -SCr stable at 1.26 and Hbg 12.8 in Feb 2022  4. Essential hypertension -BP controlled on exam today -continue Losartan 50mg  daily, amlodipine 10mg  daily  5.  HLD -LDL goal < 70 -LDL was 35 in Nov 2021 -continue atorvastatin 80mg  daily  6.  OSA -he uses CPAP nightly  7.  Aortic stenosis -mild by echo 09/2019 with mean AVG  66mmHg  8.  Mildly dilated aortic root -14mmby echo -repeat echo in May 2022   -repeat Dispo: 6 months  Medication Adjustments/Labs and Tests Ordered: Current medicines are reviewed at length with the patient today.  Concerns regarding medicines are outlined above.  Tests Ordered: No orders of the defined types were placed in this encounter.  Medication Changes: No orders of the defined types were placed in this encounter.   Signed, Fransico Him, MD  07/29/2020 2:45 PM    [facility] [address], [place], [place]  [address] Phone: [phone]; Fax: [phone]

## 2020-07-29 NOTE — Assessment & Plan Note (Signed)
Current medications: amlodipine 10mg  daily, lasix 40mg  daily, losartan 50mg  daily Blood pressure was initially above goal in the office however improved to 130/86 on recheck. Plan -continue current management for now -he will follow up with me in 1-2w for re-evaluation

## 2020-07-29 NOTE — Assessment & Plan Note (Signed)
Assessment: Request by Dr. Claudia Desanctis for preoperative clearance prior to scrotal cyst exision. Surgery will be scheduled after completion of this assessment..   ID - Patient denies recent illness, fevers, chills, cough, congestion, sick contacts.  Functional capacity - admits to ability to take care of self, such as eat, dress or use the toilet. He is not able to walk up a flight of steps or a hill. He does not have the ability to do heavy work around the house such as scrubbing floors or lifting or moving heavy furniture. He does not have the ability to participate in strenuous sports such as swimming, singles tennis, football, basketball, and skiing. Cardiovascular status - He has heart failure, hypertension, PAF, and  Tachy-brady syndrome s/p pacemaker implantation 2/1. Denies chest pain, shortness of breath, palpitations, tachycardia.  Anesthesia - Patient denies past history of adverse reaction to anesthesia, denies known family history of reaction to anesthesia.  Seizure history - Patient denies prior history of seizure disorder.  Pulmonary function - Patient does not not have ongoing tobacco use. The patient currently denies acute pulmonary complaints including, but not limited to shortness of breath, difficulty breathing, cough.  Hematologic status - patient denies prior easy bruising, easy bleeding. No known bleeding disorders. denies have history of anemia, with baseline hemoglobin of 13. Cardiac functional status : <4 METS (Can take care of self, such as eat, dress, or use the toilet (1 MET) Using the [facility] preoperative cardiac risk calculator, the patient's estimated risk probability for perioperative MI or cardiac arrest is 0.6%.  The preoperative mortality predictor score is 7 points = 0.6% risk of perioperative mortality  Plan:  -plan to proceed with surgery. He has a cardiology appointment 3/8--appreciate their input

## 2020-07-29 NOTE — Assessment & Plan Note (Signed)
Wt is down 335lb>314lb since the beginning of February. He has been working on increasing physical activity and diet adherence. I congratulated him on this 21# weight loss.  I considered semagluamide to assist with weight loss and glycemic control however would consider his risk for metabolic acidosis too high at this time. Plan -continue current management with lifestyle modifications

## 2020-07-29 NOTE — Patient Instructions (Signed)
Medication Instructions:  Your physician recommends that you continue on your current medications as directed. Please refer to the Current Medication list given to you today.  *If you need a refill on your cardiac medications before your next appointment, please call your pharmacy*   Testing/Procedures: Your physician has requested that you have an echocardiogram in May 2022. Echocardiography is a painless test that uses sound waves to create images of your heart. It provides your doctor with information about the size and shape of your heart and how well your heart's chambers and valves are working. This procedure takes approximately one hour. There are no restrictions for this procedure.   Follow-Up: At [facility], you and your health needs are our priority.  As part of our continuing mission to provide you with exceptional heart care, we have created designated Provider Care Teams.  These Care Teams include your primary Cardiologist (physician) and Advanced Practice Providers (APPs -  Physician Assistants and Nurse Practitioners) who all work together to provide you with the care you need, when you need it.  Your next appointment:   6 month(s)  The format for your next appointment:   In Person  Provider:   You may see Fransico Him, MD or one of the following Advanced Practice Providers on your designated Care Team:    Melina Copa, PA-C  Ermalinda Barrios, PA-C

## 2020-07-29 NOTE — Progress Notes (Signed)
Internal Medicine Clinic Attending  Case discussed with Dr. [name]  At the time of the visit.  We reviewed the resident's history and exam and pertinent patient test results.  I agree with the assessment, diagnosis, and plan of care documented in the resident's note.  

## 2020-07-29 NOTE — Addendum Note (Signed)
Addended by: Antonieta Iba on: 07/29/2020 02:48 PM   Modules accepted: Orders

## 2020-08-01 ENCOUNTER — Telehealth: Payer: Self-pay | Admitting: Cardiology

## 2020-08-01 ENCOUNTER — Other Ambulatory Visit: Payer: Self-pay | Admitting: Urology

## 2020-08-01 NOTE — Telephone Encounter (Signed)
   Primary Cardiologist: Fransico Him, MD  Chart reviewed as part of pre-operative protocol coverage. Given past medical history and time since last visit, based on ACC/AHA guidelines, BASILIO MEADOW would be at acceptable risk for the planned procedure without further cardiovascular testing.   Patient with diagnosis of afib on Eliquis for anticoagulation.    Procedure: Exicision of Scrotal Cyst Date of procedure: TBD  CHA2DS2-VASc Score = 4  This indicates a 4.8% annual risk of stroke. The patient's score is based upon: CHF History: Yes HTN History: Yes Diabetes History: Yes Stroke History: No Vascular Disease History: Yes Age Score: 0 Gender Score: 0      CrCl 106 ml/min  Per office protocol, patient can hold Eliquis for 2 days prior to procedure.  I will route this recommendation to the requesting party via Epic fax function and remove from pre-op pool.  Please call with questions.  Jossie Ng. Van Seymore NP-C    08/01/2020, 1:37 PM [facility] [address] Office [phone] Fax [phone]

## 2020-08-01 NOTE — Telephone Encounter (Signed)
   [facility] Pre-operative Risk Assessment    HEARTCARE STAFF: - Please ensure there is not already an duplicate clearance open for this procedure. - Under Visit Info/Reason for Call, type in Other and utilize the format Clearance MM/DD/YY or Clearance TBD. Do not use dashes or single digits. - If request is for dental extraction, please clarify the # of teeth to be extracted.  Request for surgical clearance:  1. What type of surgery is being performed?Exicision of Scrotal Cyst   2. When is this surgery scheduled? pening  3. What type of clearance is required (medical clearance vs. Pharmacy clearance to hold med vs. Both)? Both  4. Are there any medications that need to be held prior to surgery and how long? Eliquis- need to stop it 48 hrs prior to surgery   5. Practice name and name of physician performing surgery? Dr Leonia Reader Pace   6. What is the office phone number? [phone].   7.   What is the office fax number? [phone]  8.   Anesthesia type (None, local, MAC, general) ? General   Allen Gould 08/01/2020, 12:11 PM  _________________________________________________________________   (provider comments below)

## 2020-08-01 NOTE — Telephone Encounter (Signed)
Patient with diagnosis of afib on Eliquis for anticoagulation.    Procedure: Exicision of Scrotal Cyst  Date of procedure: TBD  CHA2DS2-VASc Score = 4  This indicates a 4.8% annual risk of stroke. The patient's score is based upon: CHF History: Yes HTN History: Yes Diabetes History: Yes Stroke History: No Vascular Disease History: Yes Age Score: 0 Gender Score: 0      CrCl 106 ml/min  Per office protocol, patient can hold Eliquis for 2 days prior to procedure.

## 2020-08-02 NOTE — Patient Instructions (Addendum)
DO NOT TAKE  Eliquis 48 hours prior to the procedure..  DO NOT TAKE NSAIDS or Asprin 7 days prior to the procedure.  PRE-PROCEDURE INSTRUCTIONS FOR DIABETIC PATIENTS  Patients on Insulin (Bring your medications/supplies the day of your procedure):  [name] Short-Acting Insulin (Regular, Humalog, Novo Log) o Take normal dose at bedtime. o DO NOT take any regular insulin on the morning of the procedure unless blood sugar is more than 200, then take  the dose.   PRE-PROCEDURE INSTRUCTIONS FOR BLOOD PRESSURE MEDICINE  DO NOT TAKE losartan or metformin on the day of the procedure.  DO TAKE amlodipine and atorvastatin with sips of water.

## 2020-08-02 NOTE — Progress Notes (Unsigned)
Office Visit   Patient ID: Allen Gould, male    DOB: [birth year], 55 y.o.   MRN: [id number]  Subjective:  CC: hypertension follow up, toe pain, pre-op medication review  HPI 55 y.o. presents today for the above. Please refer to problem based charting.      ACTIVE MEDICATIONS   Outpatient Medications Prior to Visit  Medication Sig Dispense Refill  . Accu-Chek Softclix Lancets lancets Check blood sugar 3 times a day 100 each 12  . amLODipine (NORVASC) 10 MG tablet Take 1 tablet (10 mg total) by mouth daily. 90 tablet 3  . apixaban (ELIQUIS) 5 MG TABS tablet Take 1 tablet (5 mg total) by mouth 2 (two) times daily. 180 tablet 3  . aspirin 81 MG EC tablet Take 81 mg by mouth daily. Swallow whole.    [name] atorvastatin (LIPITOR) 80 MG tablet Take 1 tablet (80 mg total) by mouth daily. 90 tablet 3  . DULoxetine (CYMBALTA) 20 MG capsule TAKE 1 CAPSULE BY MOUTH ONCE DAILY (Patient taking differently: Take 20 mg by mouth daily.) 30 capsule 3  . famotidine (PEPCID) 10 MG tablet Take 1 tablet (10 mg total) by mouth 2 (two) times daily. 60 tablet 0  . furosemide (LASIX) 40 MG tablet Take 1 tablet (40 mg total) by mouth daily. 30 tablet 11  . gabapentin (NEURONTIN) 300 MG capsule Take 1 capsule (300 mg total) by mouth in the morning, at noon, and at bedtime. 270 capsule 1  . glucose blood (ACCU-CHEK GUIDE) test strip Check blood sugar 3 times a day 100 each 12  . HUMALOG 100 UNIT/ML injection Inject 4 Units into the skin 3 (three) times daily as needed (blood sugar over 200).    [name] ibuprofen (ADVIL) 200 MG tablet Take 600 mg by mouth every 8 (eight) hours as needed for mild pain.    [name] losartan (COZAAR) 50 MG tablet Take 1 tablet (50 mg total) by mouth daily. 90 tablet 1  . metFORMIN (GLUCOPHAGE-XR) 750 MG 24 hr tablet Take 750 mg by mouth in the morning and at bedtime.    . vitamin B-12 (CYANOCOBALAMIN) 1000 MCG tablet Take 1,000 mcg by mouth daily.     No facility-administered medications prior to  visit.     ROS  Review of Systems  Constitutional: Negative for chills, fever and unexpected weight change.  Respiratory: Negative for shortness of breath.   Cardiovascular: Negative for chest pain.  Neurological: Negative for dizziness, light-headedness and headaches.    Objective:   BP 124/70 (BP Location: Right Arm, Patient Position: Sitting, Cuff Size: Large)   Pulse 73   Wt (!) 315 lb 4.8 oz (143 kg)   SpO2 99%   BMI 45.24 kg/m  Wt Readings from Last 3 Encounters:  08/04/20 (!) 315 lb 4.8 oz (143 kg)  07/29/20 (!) 314 lb (142.4 kg)  07/28/20 (!) 314 lb 11.2 oz (142.7 kg)   BP Readings from Last 3 Encounters:  08/04/20 124/70  07/29/20 124/78  07/28/20 (!) 146/94   Physical exam General: well appearing, sitting in a wheelchair Cardiac: lower extremities warm, no LE edema Skin: no rash or wounds on the feet bilaterally. Toenails are thick but not discolored.   Health Maintenance:   Health Maintenance  Topic Date Due  . COVID-19 Vaccine (1) Never done  . TETANUS/TDAP  Never done  . COLONOSCOPY (Pts 45-64yrs Insurance coverage will need to be confirmed)  Never done  . OPHTHALMOLOGY EXAM  03/01/2015  . INFLUENZA  VACCINE  08/21/2020 (Originally 12/23/2019)  . HEMOGLOBIN A1C  09/09/2020  . LIPID PANEL  04/08/2021  . FOOT EXAM  08/04/2021  . PNEUMOCOCCAL POLYSACCHARIDE VACCINE AGE 36-64 HIGH RISK  Completed  . Hepatitis C Screening  Completed  . HIV Screening  Completed  . HPV VACCINES  Aged Out     Assessment & Plan:   Problem List Items Addressed This Visit      Cardiovascular and Mediastinum   Hypertension (Chronic)    Blood pressure is at goal at today's visit.  I reviewed his home blood pressure cuff readings which are mainly in the 120s/80s No change in management at this time.        Endocrine   DM (diabetes mellitus) type 2, uncontrolled, without ketoacidosis - Primary (Chronic)    Pt brought his glucometer to today's visit. Glucoses are ranging  in the 120s-150s. Pt questions if he has an ingrown toenail on his right foot on the first digit. I do not appreciate this on exam however he should be seeing podiatry for regular exams given his DM Plan -Referral to podiatry placed. -would consider starting victoza when he sees me again in 3 months      Relevant Orders   Ambulatory referral to Podiatry     Other   Pre-op evaluation    Pre-op medications management discussed.  -no NSAIDS or aspirin for 7d prior to procedure -hold eliquis 48h prior to the procedure -hold losartan, metformin and humalog on the morning of the procedure  Written instructions provided        Follow up in 3 months     Pt discussed with Dr. Adolm Joseph, MD Internal Medicine Resident PGY-2 [facility] Pager: [phone] 08/04/2020 10:36 AM

## 2020-08-04 ENCOUNTER — Ambulatory Visit (INDEPENDENT_AMBULATORY_CARE_PROVIDER_SITE_OTHER): Payer: Medicaid Other | Admitting: Internal Medicine

## 2020-08-04 ENCOUNTER — Other Ambulatory Visit: Payer: Self-pay

## 2020-08-04 ENCOUNTER — Encounter: Payer: Self-pay | Admitting: Internal Medicine

## 2020-08-04 VITALS — BP 124/70 | HR 73 | Wt 315.3 lb

## 2020-08-04 DIAGNOSIS — I10 Essential (primary) hypertension: Secondary | ICD-10-CM

## 2020-08-04 DIAGNOSIS — Z01818 Encounter for other preprocedural examination: Secondary | ICD-10-CM

## 2020-08-04 DIAGNOSIS — E1110 Type 2 diabetes mellitus with ketoacidosis without coma: Secondary | ICD-10-CM

## 2020-08-04 NOTE — Assessment & Plan Note (Addendum)
Pt brought his glucometer to today's visit. Glucoses are ranging in the 120s-150s. Pt questions if he has an ingrown toenail on his right foot on the first digit. I do not appreciate this on exam however he should be seeing podiatry for regular exams given his DM Plan -Referral to podiatry placed. -would consider starting victoza when he sees me again in 3 months

## 2020-08-04 NOTE — Assessment & Plan Note (Signed)
Pre-op medications management discussed.  -no NSAIDS or aspirin for 7d prior to procedure -hold eliquis 48h prior to the procedure -hold losartan, metformin and humalog on the morning of the procedure  Written instructions provided

## 2020-08-04 NOTE — Assessment & Plan Note (Addendum)
Blood pressure is at goal at today's visit.  I reviewed his home blood pressure cuff readings which are mainly in the 120s/80s No change in management at this time.

## 2020-08-05 ENCOUNTER — Telehealth: Payer: Self-pay

## 2020-08-05 NOTE — Telephone Encounter (Signed)
Rerouted the patients' referral from [facility]. Unaware they did not Accept his [employer] Medicaid.  Verified again online that  ([facility]) does take his ins.  Called the patient and notified him of the change of offices

## 2020-08-05 NOTE — Telephone Encounter (Signed)
Pls contact pt [phone]

## 2020-08-05 NOTE — Telephone Encounter (Signed)
Return pt's call - stated the podiatrist office we referred him to does not take Medicaid.(See note in referral)

## 2020-08-06 NOTE — Progress Notes (Signed)
Internal Medicine Clinic Attending  Case discussed with Dr. Darrick Meigs  At the time of the visit.  We reviewed the residents history and exam and pertinent patient test results.  I agree with the assessment, diagnosis, and plan of care documented in the residents note.

## 2020-08-07 NOTE — Patient Instructions (Addendum)
DUE TO COVID-19 ONLY ONE VISITOR IS ALLOWED TO COME WITH YOU AND STAY IN THE WAITING ROOM ONLY DURING PRE OP AND PROCEDURE.     COVID SWAB TESTING MUST BE COMPLETED ON:  Friday, 08-15-20 @ 8:35 AM   [address] [place], [place] [address]  (Must self quarantine after testing. Follow instructions on handout.)        Your procedure is scheduled on:  Tuesday, 08-19-20   Report to [facility] Main  Entrance   Report to admitting at 9:30 AM   Call this number if you have problems the morning of surgery [phone]   Do not eat food :After Midnight.   May have liquids until 8:30 AM  day of surgery  CLEAR LIQUID DIET  Foods Allowed                                                                     Foods Excluded  Water, Black Coffee and tea, regular and decaf              liquids that you cannot  Plain Jell-O in any flavor  (No red)                                    see through such as: Fruit ices (not with fruit pulp)                                      milk, soups, orange juice              Iced Popsicles (No red)                                      All solid food                                   Apple juices Sports drinks like Gatorade (No red) Lightly seasoned clear broth or consume(fat free) Sugar, honey syrup     Oral Hygiene is also important to reduce your risk of infection.                                    Remember - BRUSH YOUR TEETH THE MORNING OF SURGERY WITH YOUR REGULAR TOOTHPASTE   Do NOT smoke after Midnight   Take these medicines the morning of surgery with A SIP OF WATER:  Amlodipine, Atorvastatin, Duloxetine, Famotidine, Gabapentin   How to Manage Your Diabetes Before and After Surgery  Why is it important to control my blood sugar before and after surgery? . Improving blood sugar levels before and after surgery helps healing and can limit problems. . A way of improving blood sugar control is eating a healthy diet by: o  Eating less sugar and  carbohydrates o  Increasing activity/exercise o  Talking with your doctor about reaching your blood  sugar goals . High blood sugars (greater than 180 mg/dL) can raise your risk of infections and slow your recovery, so you will need to focus on controlling your diabetes during the weeks before surgery. . Make sure that the doctor who takes care of your diabetes knows about your planned surgery including the date and location.  How do I manage my blood sugar before surgery? . Check your blood sugar at least 4 times a day, starting 2 days before surgery, to make sure that the level is not too high or low. o Check your blood sugar the morning of your surgery when you wake up and every 2 hours until you get to the Short Stay unit. . If your blood sugar is less than 70 mg/dL, you will need to treat for low blood sugar: o Do not take insulin. o Treat a low blood sugar (less than 70 mg/dL) with  cup of clear juice (cranberry or apple), 4 glucose tablets, OR glucose gel. o Recheck blood sugar in 15 minutes after treatment (to make sure it is greater than 70 mg/dL). If your blood sugar is not greater than 70 mg/dL on recheck, call [phone] for further instructions. . Report your blood sugar to the short stay nurse when you get to Short Stay.  . If you are admitted to the hospital after surgery: o Your blood sugar will be checked by the staff and you will probably be given insulin after surgery (instead of oral diabetes medicines) to make sure you have good blood sugar levels. o The goal for blood sugar control after surgery is 80-180 mg/dL.   WHAT DO I DO ABOUT MY DIABETES MEDICATION?  [name] Do not take oral diabetes medicines (pills) the morning of surgery.  . THE DAY BEFORE SURGERY:  Take Metformin as prescribed.                               Take 4 units of Humalog Insulin as prescribed (no bedtime dose)      . THE MORNING OF SURGERY:  Do not take Metformin.                                           Take 2 units of Humalog Insulin if CBG greater than 220  Reviewed and Endorsed by [facility] Patient Education Committee, August 2015                               You may not have any metal on your body including  jewelry, and body piercings             Do not wear  lotions, powders, perfumes/cologne, or deodorant             Men may shave face and neck.   Do not bring valuables to the hospital. [place].   Contacts, dentures or bridgework may not be worn into surgery.   Patients discharged the day of surgery will not be allowed to drive home.   Bring CPAP mask and tubing day of surgery   Please read over the following fact sheets you were given: IF YOU HAVE QUESTIONS ABOUT  YOUR PRE OP INSTRUCTIONS PLEASE CALL  [facility] - Preparing for Surgery Before surgery, you can play an important role.  Because skin is not sterile, your skin needs to be as free of germs as possible.  You can reduce the number of germs on your skin by washing with CHG (chlorahexidine gluconate) soap before surgery.  CHG is an antiseptic cleaner which kills germs and bonds with the skin to continue killing germs even after washing. Please DO NOT use if you have an allergy to CHG or antibacterial soaps.  If your skin becomes reddened/irritated stop using the CHG and inform your nurse when you arrive at Short Stay. Do not shave (including legs and underarms) for at least 48 hours prior to the first CHG shower.  You may shave your face/neck.  Please follow these instructions carefully:  1.  Shower with CHG Soap the night before surgery and the  morning of surgery.  2.  If you choose to wash your hair, wash your hair first as usual with your normal  shampoo.  3.  After you shampoo, rinse your hair and body thoroughly to remove the shampoo.                             4.  Use CHG as you would any other liquid soap.  You can apply chg directly  to the skin and wash.  Gently with a scrungie or clean washcloth.  5.  Apply the CHG Soap to your body ONLY FROM THE NECK DOWN.   Do   not use on face/ open                           Wound or open sores. Avoid contact with eyes, ears mouth and   genitals (private parts).                       Wash face,  Genitals (private parts) with your normal soap.             6.  Wash thoroughly, paying special attention to the area where your    surgery  will be performed.  7.  Thoroughly rinse your body with warm water from the neck down.  8.  DO NOT shower/wash with your normal soap after using and rinsing off the CHG Soap.                9.  Pat yourself dry with a clean towel.            10.  Wear clean pajamas.            11.  Place clean sheets on your bed the night of your first shower and do not  sleep with pets. Day of Surgery : Do not apply any lotions/deodorants the morning of surgery.  Please wear clean clothes to the hospital/surgery center.  FAILURE TO FOLLOW THESE INSTRUCTIONS MAY RESULT IN THE CANCELLATION OF YOUR SURGERY  PATIENT SIGNATURE_________________________________  NURSE SIGNATURE__________________________________  ________________________________________________________________________

## 2020-08-07 NOTE — Progress Notes (Addendum)
COVID Vaccine Completed: x2 Date COVID Vaccine completed: 2nd dose 8/21 Has received booster: COVID vaccine manufacturer:    Moderna    Date of COVID positive in last 43 days:N/A  PCP - [name], MD Cardiologist - Fransico Him, MD Electrophysiology - Lars Mage, MD  Cardiac clearance in chart dated 08-01-20 by Coletta Memos, NP-C  Chest x-ray - 06-24-20 Epic EKG - 06-24-20 Epic Stress Test -  ECHO - 10-11-19 Epic Cardiac Cath -  Pacemaker/ICD device last checked:  07-08-20.  Device orders on chart and in Epic Spinal Cord Stimulator: Long Term Monitor - 05-14-20 Epic  Sleep Study - +sleep apnea CPAP - Yes  Fasting Blood Sugar - 85 to 212 Checks Blood Sugar - 2 times a day  Blood Thinner Instructions:  Eliquis 5 mg BID.  Hold for 2 days preop per clearance Aspirin Instructions:  ASA 81 mg  Last Dose: to stop 7 days prior to surgery per patient  Activity level:  Unable to go up a flight of stairs without symptoms of shortness of breath and hip pain.  Patient states that he has experienced SOB with exertion x3 years.   Has improved with weight loss.     Anesthesia review: CHF, tachycardia-bradycardia syndrome, second degree AV block Mobitz type 1, Afib, dilated aortic root, aortic stenosis.  Asthma, HTN, DM, OSA  BUN 28 and creatinine 1.50 on PAT labs.  A1c 7.7  Patient denies shortness of breath, fever, cough and chest pain at PAT appointment   Patient verbalized understanding of instructions that were given to them at the PAT appointment. Patient was also instructed that they will need to review over the PAT instructions again at home before surgery.

## 2020-08-08 ENCOUNTER — Encounter: Payer: Self-pay | Admitting: Cardiology

## 2020-08-08 NOTE — Progress Notes (Signed)
PERIOPERATIVE PRESCRIPTION FOR IMPLANTED CARDIAC DEVICE PROGRAMMING   Patient Information: Name: Colbe, Viviano  DOB: [birth year]  MRN: [id number]  Planned Procedure: Excision of scrotal cysts  Surgeon: Jacalyn Lefevre  Date of Procedure: 08-19-20  Cautery will be used. yes  Position during surgery: supine   Please send documentation back to:  [facility] (Fax # [phone])   Lambert Keto, RN  08/07/2020 3:03 PM      Device Information:   Clinic EP Physician:   Lars Mage Device Type:  Pacemaker Manufacturer and Phone #:  [employer]: [phone] Pacemaker Dependent?:  No Date of Last Device Check:  07/08/20        Normal Device Function?:  Yes     Electrophysiologist's Recommendations:    Have magnet available.  Provide continuous ECG monitoring when magnet is used or reprogramming is to be performed.   Procedure should not interfere with device function.  No device programming or magnet placement needed.  Per Device Clinic Standing Orders, Drake Leach  08/08/2020 10:38 AM

## 2020-08-12 ENCOUNTER — Encounter (HOSPITAL_COMMUNITY)
Admission: RE | Admit: 2020-08-12 | Discharge: 2020-08-12 | Disposition: A | Discharge status: Home / Self Care | Payer: Medicaid Other | Source: Ambulatory Visit | Attending: Urology | Admitting: Urology

## 2020-08-12 ENCOUNTER — Other Ambulatory Visit: Payer: Self-pay

## 2020-08-12 ENCOUNTER — Encounter (HOSPITAL_COMMUNITY): Payer: Self-pay

## 2020-08-12 DIAGNOSIS — K219 Gastro-esophageal reflux disease without esophagitis: Secondary | ICD-10-CM | POA: Insufficient documentation

## 2020-08-12 DIAGNOSIS — Z7901 Long term (current) use of anticoagulants: Secondary | ICD-10-CM | POA: Diagnosis not present

## 2020-08-12 DIAGNOSIS — Z87891 Personal history of nicotine dependence: Secondary | ICD-10-CM | POA: Insufficient documentation

## 2020-08-12 DIAGNOSIS — L729 Follicular cyst of the skin and subcutaneous tissue, unspecified: Secondary | ICD-10-CM | POA: Diagnosis not present

## 2020-08-12 DIAGNOSIS — Z7984 Long term (current) use of oral hypoglycemic drugs: Secondary | ICD-10-CM | POA: Diagnosis not present

## 2020-08-12 DIAGNOSIS — E118 Type 2 diabetes mellitus with unspecified complications: Secondary | ICD-10-CM | POA: Diagnosis not present

## 2020-08-12 DIAGNOSIS — Z950 Presence of cardiac pacemaker: Secondary | ICD-10-CM | POA: Insufficient documentation

## 2020-08-12 DIAGNOSIS — Z7982 Long term (current) use of aspirin: Secondary | ICD-10-CM | POA: Insufficient documentation

## 2020-08-12 DIAGNOSIS — J45909 Unspecified asthma, uncomplicated: Secondary | ICD-10-CM | POA: Insufficient documentation

## 2020-08-12 DIAGNOSIS — Z01812 Encounter for preprocedural laboratory examination: Secondary | ICD-10-CM | POA: Insufficient documentation

## 2020-08-12 DIAGNOSIS — Z79899 Other long term (current) drug therapy: Secondary | ICD-10-CM | POA: Insufficient documentation

## 2020-08-12 DIAGNOSIS — I10 Essential (primary) hypertension: Secondary | ICD-10-CM | POA: Diagnosis not present

## 2020-08-12 LAB — CBC
HCT: 41 % (ref 39.0–52.0)
Hemoglobin: 13.3 g/dL (ref 13.0–17.0)
MCH: 31.1 pg (ref 26.0–34.0)
MCHC: 32.4 g/dL (ref 30.0–36.0)
MCV: 95.8 fL (ref 80.0–100.0)
Platelets: 207 10*3/uL (ref 150–400)
RBC: 4.28 MIL/uL (ref 4.22–5.81)
RDW: 12.3 % (ref 11.5–15.5)
WBC: 8.5 10*3/uL (ref 4.0–10.5)
nRBC: 0 % (ref 0.0–0.2)

## 2020-08-12 LAB — COMPREHENSIVE METABOLIC PANEL
ALT: 19 U/L (ref 0–44)
AST: 17 U/L (ref 15–41)
Albumin: 4.2 g/dL (ref 3.5–5.0)
Alkaline Phosphatase: 87 U/L (ref 38–126)
Anion gap: 12 (ref 5–15)
BUN: 28 mg/dL — ABNORMAL HIGH (ref 6–20)
CO2: 22 mmol/L (ref 22–32)
Calcium: 10.2 mg/dL (ref 8.9–10.3)
Chloride: 101 mmol/L (ref 98–111)
Creatinine, Ser: 1.5 mg/dL — ABNORMAL HIGH (ref 0.61–1.24)
GFR, Estimated: 54 mL/min — ABNORMAL LOW (ref >60)
Glucose, Bld: 118 mg/dL — ABNORMAL HIGH (ref 70–99)
Potassium: 4.9 mmol/L (ref 3.5–5.1)
Sodium: 135 mmol/L (ref 135–145)
Total Bilirubin: 0.6 mg/dL (ref 0.3–1.2)
Total Protein: 8.3 g/dL — ABNORMAL HIGH (ref 6.5–8.1)

## 2020-08-12 LAB — GLUCOSE, CAPILLARY: Glucose-Capillary: 132 mg/dL — ABNORMAL HIGH (ref 70–99)

## 2020-08-12 LAB — HEMOGLOBIN A1C
Hgb A1c MFr Bld: 7.7 % — ABNORMAL HIGH (ref 4.8–5.6)
Mean Plasma Glucose: 174.29 mg/dL

## 2020-08-12 NOTE — Progress Notes (Signed)
A1c and CMP sent to Dr. Claudia Desanctis to review.

## 2020-08-13 NOTE — Progress Notes (Signed)
Anesthesia Chart Review   Case: [id number] Date/Time: 08/19/20 1115   Procedure: EXCISION OF SCROTAL CYSTS (N/A ) - 1 HR   Anesthesia type: General   Pre-op diagnosis: SCROTAL CYST   Location: [facility] ROOM 05 / [facility]   Surgeons: Robley Fries, MD      DISCUSSION:55 y.o. former smoker with h/o HTN, GERD, DM II, asthma, PAF (on Eliquis), pacemaker in place (device orders on 08/08/20 progress note), mild aortic stenosis (mean gradient measures 12.0 mmHg), scrotal cyst scheduled for above procedure 08/19/2020 with Dr. Jacalyn Lefevre.   Per cardiology preoperative evaluation 08/01/20, "Chart reviewed as part of pre-operative protocol coverage. Given past medical history and time since last visit, based on ACC/AHA guidelines, Allen Gould would be at acceptable risk for the planned procedure without further cardiovascular testing.  Patient with diagnosis ofafibon Eliquisfor anticoagulation.  Procedure:Exicision of Scrotal Cyst Date of procedure:TBD CHA2DS2-VAScScore = 4 This indicates a4.8% annual risk of stroke. The patient's score is based upon: CHF History: Yes HTN History: Yes Diabetes History: Yes Stroke History: No Vascular Disease History: Yes Age Score: 0 Gender Score: 0 CrCl106 ml/min Per office protocol, patient can holdEliquisfor 2days prior to procedure."  Anticipate pt can proceed with planned procedure barring acute status change.   VS: BP (!) 152/98   Pulse 91   Temp 36.6 C (Oral)   Resp 18   Ht 5' 10"  (1.778 m)   Wt (!) 141.4 kg   SpO2 99%   BMI 44.74 kg/m   PROVIDERS: [name], MD is PCP   Fransico Him, MD is Cardiologist   Lars Mage, MD is EP LABS: Labs reviewed: Acceptable for surgery. (all labs ordered are listed, but only abnormal results are displayed)  Labs Reviewed  HEMOGLOBIN A1C - Abnormal; Notable for the following components:      Result Value   Hgb A1c MFr Bld 7.7 (*)    All other components within normal limits   COMPREHENSIVE METABOLIC PANEL - Abnormal; Notable for the following components:   Glucose, Bld 118 (*)    BUN 28 (*)    Creatinine, Ser 1.50 (*)    Total Protein 8.3 (*)    GFR, Estimated 54 (*)    All other components within normal limits  GLUCOSE, CAPILLARY - Abnormal; Notable for the following components:   Glucose-Capillary 132 (*)    All other components within normal limits  CBC     IMAGES:   EKG: 06/24/20 Rate 71 bpm  AV dual-paced rhyhtm   CV: Echo 10/11/2019 IMPRESSIONS    1. Left ventricular ejection fraction, by estimation, is 60 to 65%. The  left ventricle has normal function. The left ventricle has no regional  wall motion abnormalities. There is moderate left ventricular hypertrophy.  Left ventricular diastolic  parameters are indeterminate.  2. Right ventricular systolic function is normal. The right ventricular  size is normal. Tricuspid regurgitation signal is inadequate for assessing  PA pressure.  3. Left atrial size was mildly dilated.  4. Severe calcification of anterior mitral leaflet along aortomitral  continuity. The mitral valve is abnormal. Trivial mitral valve  regurgitation. Mild mitral stenosis by visual assessment.  5. The aortic valve is abnormal. Aortic valve regurgitation is not  visualized. Mild aortic valve stenosis. Aortic valve mean gradient  measures 12.0 mmHg.  6. Aortic dilatation noted. There is borderline dilatation of the aortic  root measuring 39 mm.  7. The inferior vena cava is normal in size with greater than 50%  respiratory variability, suggesting right atrial pressure of 3 mmHg.  Past Medical History:  Diagnosis Date  . Acute kidney injury ([place]) 09/01/2019  . Aortic stenosis    mild with mean AVG 86mHg by echo 09/2019  . Asthma   . Balanitis 08/09/2014  . Bifascicular block   . Cough 08/09/2014  . Diabetes mellitus without complication ([facility]   . Dilated aortic root ([facility] [place]    358mby echo 09/2019  . DM  (diabetes mellitus) type 2, uncontrolled, without ketoacidosis 08/10/2013  . Dyslipidemia 03/01/2014  . Financial difficulties 08/09/2014  . GERD (gastroesophageal reflux disease)   . Health care maintenance 03/01/2014  . Hypertension   . Hypertensive urgency 05/29/2017  . Hypomagnesemia 05/29/2017  . Insomnia 08/29/2013  . Morbid obesity ([facility]4/12/2013  . Near syncope 05/29/2017  . Odontogenic infection of jaw 05/29/2017  . PAC (premature atrial contraction)   . PAF (paroxysmal atrial fibrillation) ([facility]  . Pressure injury of skin 09/02/2019  . PVCs (premature ventricular contractions)   . Right shoulder pain 08/29/2013  . Second degree AV block, Mobitz type I   . Sepsis ([facility]1/10/2017  . Sleep apnea    uses cpap    Past Surgical History:  Procedure Laterality Date  . APPENDECTOMY    . PACEMAKER IMPLANT N/A 06/24/2020   Procedure: PACEMAKER IMPLANT;  Surgeon: LaVickie EpleyMD;  [facility];  Service: Cardiovascular;  Laterality: N/A;    MEDICATIONS: . Accu-Chek Softclix Lancets lancets  . amLODipine (NORVASC) 10 MG tablet  . apixaban (ELIQUIS) 5 MG TABS tablet  . aspirin 81 MG EC tablet  . atorvastatin (LIPITOR) 80 MG tablet  . DULoxetine (CYMBALTA) 20 MG capsule  . famotidine (PEPCID) 10 MG tablet  . furosemide (LASIX) 40 MG tablet  . gabapentin (NEURONTIN) 300 MG capsule  . glucose blood (ACCU-CHEK GUIDE) test strip  . HUMALOG 100 UNIT/ML injection  . ibuprofen (ADVIL) 200 MG tablet  . losartan (COZAAR) 50 MG tablet  . metFORMIN (GLUCOPHAGE-XR) 750 MG 24 hr tablet  . vitamin B-12 (CYANOCOBALAMIN) 1000 MCG tablet   No current facility-administered medications for this encounter.     JeKonrad FelixPA-C [facility] Pre-Surgical Testing [phone]

## 2020-08-15 ENCOUNTER — Other Ambulatory Visit (HOSPITAL_COMMUNITY)
Admission: RE | Admit: 2020-08-15 | Discharge: 2020-08-15 | Disposition: A | Discharge status: Home / Self Care | Payer: Medicare Other | Source: Ambulatory Visit | Attending: Urology | Admitting: Urology

## 2020-08-15 DIAGNOSIS — Z20822 Contact with and (suspected) exposure to covid-19: Secondary | ICD-10-CM | POA: Diagnosis not present

## 2020-08-15 DIAGNOSIS — Z01812 Encounter for preprocedural laboratory examination: Secondary | ICD-10-CM | POA: Insufficient documentation

## 2020-08-15 LAB — SARS CORONAVIRUS 2 (TAT 6-24 HRS): SARS Coronavirus 2: NEGATIVE

## 2020-08-18 MED ORDER — DEXTROSE 5 % IV SOLN
3.0000 g | INTRAVENOUS | Status: AC
  Administered 2020-08-19: 3 g via INTRAVENOUS
  Filled 2020-08-18: qty 3

## 2020-08-18 NOTE — H&P (Signed)
CC/HPI: cc: Scrotal cellulitis/abscess   07/10/20: 55 year old man referred by primary care for scrotal abscess/cellulitis that occurred in December 2021. 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 He has required excision of these previously. Most recently he has been having drainage since December 2021. There is a foul odor associated with this. Patient is on Eliquis.   08/07/2020: Mr. occur is a 55 year old man who was about to undergo surgery for scrotal cyst. He is looking forward to this procedure as he feels very embarrassed by the amount of drainage and leakage he has from his scrotum. Currently, he denies any fevers chills, shortness of breath, chest pain or changes to his health history. He denies any new medications. He does not have any concerns or questions regarding the upcoming surgery. He understands he needs to hold his Eliquis prior to surgery.     ALLERGIES: No Known Drug Allergies shell fish    MEDICATIONS: Aspirin 81 mg tablet,chewable  Amlodipine Besylate  Atorvastatin Calcium 80 mg tablet  Cymbalta 20 mg capsule,delayed release  Eliquis 5 mg tablet  Famotidine 10 mg tablet  Furosemide  Gabapentin 300 mg capsule  Humalog  Losartan Potassium 50 mg tablet  Metformin Hcl Er 750 mg tablet, extended release 24 hr  Vitamin B12     GU PSH: None   NON-GU PSH: Appendectomy     GU PMH: None   NON-GU PMH: Sebaceous cyst, Patient with multiple scrotal cyst that are becoming secondarily infected. Discussed excision in operating room. The risks and benefits of the procedure were discussed with the patient including recurrence, pain, bleeding, infection, damage to surrounding structures, need for future treatment. He understands and would like to proceed. He will need to hold his Eliquis prior to the procedure. Will work on obtaining clearance from his PCP. - 07/10/2020 Anxiety Arrhythmia Arthritis Asthma Atrial  Fibrillation Depression Diabetes Type 2 GERD Hypercholesterolemia Hypertension Sleep Apnea    FAMILY HISTORY: Diabetes - Grandmother, Father, Mother Heart Disease - 31, Father, Mother Hypertension - Grandmother, Father, Mother Kidney Failure - Mother   SOCIAL HISTORY: Marital Status: Divorced Preferred Language: English; Ethnicity: Not Hispanic Or Latino; Race: Black or African American Current Smoking Status: Patient has never smoked.   Tobacco Use Assessment Completed: Used Tobacco in last 30 days? Has not drank since 06/10/2020.  Drinks 2 caffeinated drinks per day.    REVIEW OF SYSTEMS:    GU Review Male:   Patient denies frequent urination, hard to postpone urination, burning/ pain with urination, get up at night to urinate, leakage of urine, stream starts and stops, trouble starting your stream, have to strain to urinate , erection problems, and penile pain.  Gastrointestinal (Upper):   Patient denies nausea, vomiting, and indigestion/ heartburn.  Gastrointestinal (Lower):   Patient denies diarrhea and constipation.  Constitutional:   Patient denies fever, night sweats, weight loss, and fatigue.  Skin:   Patient denies skin rash/ lesion and itching.  Eyes:   Patient denies blurred vision and double vision.  Ears/ Nose/ Throat:   Patient denies sore throat and sinus problems.  Hematologic/Lymphatic:   Patient denies swollen glands and easy bruising.  Cardiovascular:   Patient denies leg swelling and chest pains.  Respiratory:   Patient denies cough and shortness of breath.  Endocrine:   Patient denies excessive thirst.  Musculoskeletal:   Patient denies back pain and joint pain.  Neurological:   Patient denies headaches and dizziness.  Psychologic:   Patient denies depression and  anxiety.   Notes: preop    VITAL SIGNS:      08/07/2020 11:12 AM  Weight 314 lb / 142.43 kg  Height 70 in / 177.8 cm  BP 126/72 mmHg  Pulse 76 /min  Temperature 98.3 F / 36.8 C   BMI 45.0 kg/m   MULTI-SYSTEM PHYSICAL EXAMINATION:    Constitutional: Obese. No physical deformities. Normally developed. Good grooming.   Respiratory: Normal breath sounds. No labored breathing, no use of accessory muscles.   Cardiovascular: Regular rate and rhythm. No murmur, no gallop. Normal temperature, normal extremity pulses, no swelling, no varicosities.   Skin: No paleness, no jaundice, no cyanosis. No lesion, no ulcer, no rash.  Neurologic / Psychiatric: Oriented to time, oriented to place, oriented to person. No depression, no anxiety, no agitation.  Gastrointestinal: No mass, no tenderness, no rigidity, non obese abdomen.  Musculoskeletal: Normal gait and station of head and neck.     Complexity of Data:  Source Of History:  Patient, Medical Record Summary  Records Review:   Previous Doctor Records, Previous Patient Records  Urine Test Review:   Urinalysis   08/07/20  Urinalysis  Urine Appearance Clear   Urine Color Yellow   Urine Glucose Neg mg/dL  Urine Bilirubin Neg mg/dL  Urine Ketones Neg mg/dL  Urine Specific Gravity 1.020   Urine Blood Neg ery/uL  Urine pH <=5.0   Urine Protein Neg mg/dL  Urine Urobilinogen 0.2 mg/dL  Urine Nitrites Neg   Urine Leukocyte Esterase Neg leu/uL   PROCEDURES:          Urinalysis Dipstick Dipstick Cont'd  Color: Yellow Bilirubin: Neg mg/dL  Appearance: Clear Ketones: Neg mg/dL  Specific Gravity: 1.020 Blood: Neg ery/uL  pH: <=5.0 Protein: Neg mg/dL  Glucose: Neg mg/dL Urobilinogen: 0.2 mg/dL    Nitrites: Neg    Leukocyte Esterase: Neg leu/uL    ASSESSMENT:      ICD-10 Details  1 NON-GU:   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 He understands to notify the clinic with any changes prior to his operation especially regarding his medications or his health history. He will keep his  preoperative lab work appointment. All of his questions were answered to the best my ability. Keep upcoming surgical appointment on 08/19/2020. Call the office for any concerns, questions or changes in the interval.

## 2020-08-19 ENCOUNTER — Ambulatory Visit (HOSPITAL_COMMUNITY)
Admission: RE | Admit: 2020-08-19 | Discharge: 2020-08-19 | Disposition: A | Discharge status: Home / Self Care | Payer: Medicare Other | Attending: Urology | Admitting: Urology

## 2020-08-19 ENCOUNTER — Encounter (HOSPITAL_COMMUNITY)
Admission: RE | Disposition: A | Discharge status: Home / Self Care | Payer: Self-pay | Source: Home / Self Care | Attending: Urology

## 2020-08-19 ENCOUNTER — Encounter (HOSPITAL_COMMUNITY): Payer: Self-pay | Admitting: Urology

## 2020-08-19 ENCOUNTER — Ambulatory Visit (HOSPITAL_COMMUNITY): Payer: Medicare Other | Admitting: Physician Assistant

## 2020-08-19 ENCOUNTER — Other Ambulatory Visit: Payer: Self-pay

## 2020-08-19 ENCOUNTER — Ambulatory Visit (HOSPITAL_COMMUNITY): Payer: Medicare Other | Admitting: Certified Registered Nurse Anesthetist

## 2020-08-19 DIAGNOSIS — Z8249 Family history of ischemic heart disease and other diseases of the circulatory system: Secondary | ICD-10-CM | POA: Insufficient documentation

## 2020-08-19 DIAGNOSIS — I10 Essential (primary) hypertension: Secondary | ICD-10-CM | POA: Insufficient documentation

## 2020-08-19 DIAGNOSIS — L720 Epidermal cyst: Secondary | ICD-10-CM | POA: Insufficient documentation

## 2020-08-19 DIAGNOSIS — E119 Type 2 diabetes mellitus without complications: Secondary | ICD-10-CM | POA: Diagnosis not present

## 2020-08-19 DIAGNOSIS — Z841 Family history of disorders of kidney and ureter: Secondary | ICD-10-CM | POA: Diagnosis not present

## 2020-08-19 DIAGNOSIS — Z833 Family history of diabetes mellitus: Secondary | ICD-10-CM | POA: Diagnosis not present

## 2020-08-19 DIAGNOSIS — Z91013 Allergy to seafood: Secondary | ICD-10-CM | POA: Diagnosis not present

## 2020-08-19 DIAGNOSIS — Z7901 Long term (current) use of anticoagulants: Secondary | ICD-10-CM | POA: Diagnosis not present

## 2020-08-19 DIAGNOSIS — Z7982 Long term (current) use of aspirin: Secondary | ICD-10-CM | POA: Insufficient documentation

## 2020-08-19 DIAGNOSIS — I4891 Unspecified atrial fibrillation: Secondary | ICD-10-CM | POA: Diagnosis not present

## 2020-08-19 HISTORY — PX: MASS EXCISION: SHX2000

## 2020-08-19 LAB — GLUCOSE, CAPILLARY
Glucose-Capillary: 182 mg/dL — ABNORMAL HIGH (ref 70–99)
Glucose-Capillary: 127 mg/dL — ABNORMAL HIGH (ref 70–99)

## 2020-08-19 SURGERY — EXCISION MASS
Anesthesia: General

## 2020-08-19 MED ORDER — HYDROCODONE-ACETAMINOPHEN 5-325 MG PO TABS: 1.0000 | ORAL_TABLET | ORAL | 0 refills | Status: DC | PRN

## 2020-08-19 MED ORDER — BUPIVACAINE HCL 0.25 % IJ SOLN
INTRAMUSCULAR | Status: AC
  Filled 2020-08-19: qty 1

## 2020-08-19 MED ORDER — LACTATED RINGERS IV SOLN: INTRAVENOUS | Status: DC

## 2020-08-19 MED ORDER — FENTANYL CITRATE (PF) 100 MCG/2ML IJ SOLN
INTRAMUSCULAR | Status: DC | PRN
  Administered 2020-08-19: 100 ug via INTRAVENOUS

## 2020-08-19 MED ORDER — MIDAZOLAM HCL 2 MG/2ML IJ SOLN
INTRAMUSCULAR | Status: AC
  Filled 2020-08-19: qty 2

## 2020-08-19 MED ORDER — DEXAMETHASONE SODIUM PHOSPHATE 10 MG/ML IJ SOLN
INTRAMUSCULAR | Status: AC
  Filled 2020-08-19: qty 1

## 2020-08-19 MED ORDER — LIDOCAINE 2% (20 MG/ML) 5 ML SYRINGE
INTRAMUSCULAR | Status: AC
  Filled 2020-08-19: qty 5

## 2020-08-19 MED ORDER — ROCURONIUM BROMIDE 10 MG/ML (PF) SYRINGE
PREFILLED_SYRINGE | INTRAVENOUS | Status: DC | PRN
  Administered 2020-08-19: 50 mg via INTRAVENOUS

## 2020-08-19 MED ORDER — HYDROMORPHONE HCL 1 MG/ML IJ SOLN
0.2500 mg | INTRAMUSCULAR | Status: DC | PRN
Start: 2020-08-19 — End: 2020-08-19

## 2020-08-19 MED ORDER — PROPOFOL 10 MG/ML IV BOLUS
INTRAVENOUS | Status: DC | PRN
  Administered 2020-08-19: 200 mg via INTRAVENOUS

## 2020-08-19 MED ORDER — DEXAMETHASONE SODIUM PHOSPHATE 10 MG/ML IJ SOLN
INTRAMUSCULAR | Status: DC | PRN
  Administered 2020-08-19: 5 mg via INTRAVENOUS

## 2020-08-19 MED ORDER — BUPIVACAINE HCL (PF) 0.25 % IJ SOLN
INTRAMUSCULAR | Status: DC | PRN
  Administered 2020-08-19: 16 mL

## 2020-08-19 MED ORDER — ACETAMINOPHEN 500 MG PO TABS
1000.0000 mg | ORAL_TABLET | Freq: Once | ORAL | Status: AC
  Administered 2020-08-19: 1000 mg via ORAL
  Filled 2020-08-19: qty 2

## 2020-08-19 MED ORDER — MIDAZOLAM HCL 2 MG/2ML IJ SOLN
INTRAMUSCULAR | Status: DC | PRN
  Administered 2020-08-19: 2 mg via INTRAVENOUS

## 2020-08-19 MED ORDER — SUGAMMADEX SODIUM 500 MG/5ML IV SOLN
INTRAVENOUS | Status: AC
  Filled 2020-08-19: qty 5

## 2020-08-19 MED ORDER — BACITRACIN ZINC 500 UNIT/GM EX OINT
TOPICAL_OINTMENT | CUTANEOUS | Status: AC
  Filled 2020-08-19: qty 28.35

## 2020-08-19 MED ORDER — SUGAMMADEX SODIUM 200 MG/2ML IV SOLN
INTRAVENOUS | Status: DC | PRN
  Administered 2020-08-19: 350 mg via INTRAVENOUS

## 2020-08-19 MED ORDER — 0.9 % SODIUM CHLORIDE (POUR BTL) OPTIME
TOPICAL | Status: DC | PRN
Start: 2020-08-19 — End: 2020-08-19
  Administered 2020-08-19: 1000 mL

## 2020-08-19 MED ORDER — ONDANSETRON HCL 4 MG/2ML IJ SOLN
INTRAMUSCULAR | Status: AC
  Filled 2020-08-19: qty 2

## 2020-08-19 MED ORDER — ORAL CARE MOUTH RINSE: 15.0000 mL | Freq: Once | OROMUCOSAL | Status: AC

## 2020-08-19 MED ORDER — ONDANSETRON HCL 4 MG/2ML IJ SOLN
INTRAMUSCULAR | Status: DC | PRN
  Administered 2020-08-19: 4 mg via INTRAVENOUS

## 2020-08-19 MED ORDER — LIDOCAINE 2% (20 MG/ML) 5 ML SYRINGE
INTRAMUSCULAR | Status: DC | PRN
  Administered 2020-08-19: 60 mg via INTRAVENOUS

## 2020-08-19 MED ORDER — PHENYLEPHRINE 40 MCG/ML (10ML) SYRINGE FOR IV PUSH (FOR BLOOD PRESSURE SUPPORT)
PREFILLED_SYRINGE | INTRAVENOUS | Status: DC | PRN
  Administered 2020-08-19 (×2): 80 ug via INTRAVENOUS

## 2020-08-19 MED ORDER — ROCURONIUM BROMIDE 10 MG/ML (PF) SYRINGE
PREFILLED_SYRINGE | INTRAVENOUS | Status: AC
  Filled 2020-08-19: qty 10

## 2020-08-19 MED ORDER — BACITRACIN ZINC 500 UNIT/GM EX OINT
TOPICAL_OINTMENT | CUTANEOUS | Status: DC | PRN
  Administered 2020-08-19: 1 via TOPICAL

## 2020-08-19 MED ORDER — PROPOFOL 10 MG/ML IV BOLUS
INTRAVENOUS | Status: AC
  Filled 2020-08-19: qty 20

## 2020-08-19 MED ORDER — CHLORHEXIDINE GLUCONATE 0.12 % MT SOLN
15.0000 mL | Freq: Once | OROMUCOSAL | Status: AC
  Administered 2020-08-19: 15 mL via OROMUCOSAL

## 2020-08-19 MED ORDER — FENTANYL CITRATE (PF) 100 MCG/2ML IJ SOLN
INTRAMUSCULAR | Status: AC
  Filled 2020-08-19: qty 2

## 2020-08-19 SURGICAL SUPPLY — 30 items
ADH SKN CLS APL DERMABOND .7 (GAUZE/BANDAGES/DRESSINGS)
BNDG GAUZE ELAST 4 BULKY (GAUZE/BANDAGES/DRESSINGS) ×1 IMPLANT
BRIEF STRETCH FOR OB PAD LRG (UNDERPADS AND DIAPERS) ×1 IMPLANT
COVER SURGICAL LIGHT HANDLE (MISCELLANEOUS) ×2 IMPLANT
DECANTER SPIKE VIAL GLASS SM (MISCELLANEOUS) ×2 IMPLANT
DERMABOND ADVANCED (GAUZE/BANDAGES/DRESSINGS)
DERMABOND ADVANCED .7 DNX12 (GAUZE/BANDAGES/DRESSINGS) IMPLANT
DRAPE LAPAROTOMY T 98X78 PEDS (DRAPES) ×2 IMPLANT
ELECT NDL TIP 2.8 STRL (NEEDLE) ×1 IMPLANT
ELECT NEEDLE TIP 2.8 STRL (NEEDLE) ×2 IMPLANT
ELECT REM PT RETURN 15FT ADLT (MISCELLANEOUS) ×2 IMPLANT
GAUZE SPONGE 4X4 12PLY STRL (GAUZE/BANDAGES/DRESSINGS) ×1 IMPLANT
GLOVE SURG ENC TEXT LTX SZ7.5 (GLOVE) ×2 IMPLANT
GOWN STRL REUS W/TWL LRG LVL3 (GOWN DISPOSABLE) ×2 IMPLANT
KIT BASIN OR (CUSTOM PROCEDURE TRAY) ×2 IMPLANT
KIT TURNOVER KIT A (KITS) ×2 IMPLANT
NEEDLE HYPO 22GX1.5 SAFETY (NEEDLE) ×1 IMPLANT
PACK GENERAL/GYN (CUSTOM PROCEDURE TRAY) ×2 IMPLANT
SUPPORT SCROTAL LG STRP (MISCELLANEOUS) ×1 IMPLANT
SUT CHROMIC 3 0 SH 27 (SUTURE) ×5 IMPLANT
SUT MNCRL AB 4-0 PS2 18 (SUTURE) ×1 IMPLANT
SUT PROLENE 4 0 RB 1 (SUTURE)
SUT PROLENE 4-0 RB1 .5 CRCL 36 (SUTURE) IMPLANT
SUT VIC AB 2-0 UR5 27 (SUTURE) IMPLANT
SUT VIC AB 3-0 SH 27 (SUTURE) ×4
SUT VIC AB 3-0 SH 27X BRD (SUTURE) IMPLANT
SUT VICRYL 0 TIES 12 18 (SUTURE) IMPLANT
SYR CONTROL 10ML LL (SYRINGE) IMPLANT
TOWEL OR 17X26 10 PK STRL BLUE (TOWEL DISPOSABLE) ×4 IMPLANT
TOWEL OR NON WOVEN STRL DISP B (DISPOSABLE) ×2 IMPLANT

## 2020-08-19 NOTE — Discharge Instructions (Signed)
Scrotal surgery postoperative instructions  Wound:  In most cases your incision will have absorbable sutures that will dissolve within the first 10-20 days. Some will fall out even earlier. Expect some redness as the sutures dissolved but this should occur only around the sutures. If there is generalized redness, especially with increasing pain or swelling, let us know. The scrotum will very likely get "black and blue" as the blood in the tissues spread. Sometimes the whole scrotum will turn colors. The black and blue is followed by a yellow and brown color. In time, all the discoloration will go away. In some cases some firm swelling in the area of the testicle may persist for up to 4-6 weeks after the surgery and is considered normal in most cases.  Diet:  You may return to your normal diet within 24 hours following your surgery. You may note some mild nausea and possibly vomiting the first 6-8 hours following surgery. This is usually due to the side effects of anesthesia, and will disappear quite soon. I would suggest clear liquids and a very light meal the first evening following your surgery.  Activity:  Your physical activity should be restricted the first 48 hours. During that time you should remain relatively inactive, moving about only when necessary. During the first 7-10 days following surgery he should avoid lifting any heavy objects (anything greater than 15 pounds), and avoid strenuous exercise. If you work, ask [place] specifically about your restrictions, both for work and home. We will write a note to your employer if needed.  You should plan to wear a tight pair of jockey shorts or an athletic supporter for the first 4-5 days, even to sleep. This will keep the scrotum immobilized to some degree and keep the swelling down.  Ice packs should be placed on and off over the scrotum for the first 48 hours. Frozen peas or corn in a ZipLock bag can be frozen, used and re-frozen. Fifteen minutes  on and 15 minutes off is a reasonable schedule. The ice is a good pain reliever and keeps the swelling down.  Hygiene:  You may shower 48 hours after your surgery. Tub bathing should be restricted until the seventh day.          Medication:  You will be sent home with some type of pain medication. In many cases you will be sent home with a narcotic pain pill (hydrococone or oxycodone). If the pain is not too bad, you may take either Tylenol (acetaminophen) or Advil (ibuprofen) which contain no narcotic agents, and might be tolerated a little better, with fewer side effects. If the pain medication you are sent home with does not control the pain, you will have to let us know. Some narcotic pain medications cannot be given or refilled by a phone call to a pharmacy.  Problems you should report to [place]:   Fever of 101.0 degrees Fahrenheit or greater.  Moderate or severe swelling under the skin incision or involving the scrotum.  Drug reaction such as hives, a rash, nausea or vomiting.

## 2020-08-19 NOTE — Progress Notes (Signed)
Patient asking for help at home possible services of homehealth Rn or CNA phoned MD she reported patient has dissolvable sutures did not need rn to care for scrotal wound. Patient's sister was coming to pick him up she was capable of driving to pick him up patient would be discharged to her care seeing he was staying with her. So orders were noted discharge patient home with no home health services. Anesthesiologist out speak to Rn was updated about above as well no concerns voiced. Paitent will be sent to phase 2 for discharge home today.

## 2020-08-19 NOTE — Anesthesia Postprocedure Evaluation (Signed)
Anesthesia Post Note  Patient: Allen Gould  Procedure(s) Performed: EXCISION OF SCROTAL CYSTS (N/A )     Patient location during evaluation: PACU Anesthesia Type: General Level of consciousness: awake and alert Pain management: pain level controlled Vital Signs Assessment: post-procedure vital signs reviewed and stable Respiratory status: spontaneous breathing, nonlabored ventilation and respiratory function stable Cardiovascular status: blood pressure returned to baseline and stable Postop Assessment: no apparent nausea or vomiting Anesthetic complications: no   No complications documented.  Last Vitals:  Vitals:   08/19/20 1345 08/19/20 1400  BP: (!) 139/113 (!) 138/92  Pulse: 74 69  Resp: 14 16  Temp:  36.4 C  SpO2: 98% 93%    Last Pain:  Vitals:   08/19/20 1400  TempSrc:   PainSc: 0-No pain                 Jayshawn Colston,W. EDMOND

## 2020-08-19 NOTE — Anesthesia Preprocedure Evaluation (Addendum)
Anesthesia Evaluation  Patient identified by MRN, date of birth, ID band Patient awake    Reviewed: Allergy & Precautions, H&P , NPO status , Patient's Chart, lab work & pertinent test results  Airway Mallampati: III  TM Distance: >3 FB Neck ROM: Full    Dental no notable dental hx. (+) Poor Dentition, Dental Advisory Given   Pulmonary asthma , sleep apnea and Continuous Positive Airway Pressure Ventilation , former smoker,    Pulmonary exam normal breath sounds clear to auscultation       Cardiovascular hypertension, Pt. on medications + dysrhythmias Atrial Fibrillation + Valvular Problems/Murmurs AS  Rhythm:Regular Rate:Normal     Neuro/Psych Depression negative neurological ROS     GI/Hepatic Neg liver ROS, GERD  Medicated,  Endo/Other  diabetes, Type 2, Oral Hypoglycemic AgentsMorbid obesity  Renal/GU Renal InsufficiencyRenal disease  negative genitourinary   Musculoskeletal   Abdominal   Peds  Hematology negative hematology ROS (+)   Anesthesia Other Findings   Reproductive/Obstetrics negative OB ROS                            Anesthesia Physical Anesthesia Plan  ASA: III  Anesthesia Plan: General   Post-op Pain Management:    Induction: Intravenous  PONV Risk Score and Plan: 3 and Ondansetron, Dexamethasone and Midazolam  Airway Management Planned: Oral ETT  Additional Equipment:   Intra-op Plan:   Post-operative Plan: Extubation in OR  Informed Consent: I have reviewed the patients History and Physical, chart, labs and discussed the procedure including the risks, benefits and alternatives for the proposed anesthesia with the patient or authorized representative who has indicated his/her understanding and acceptance.     Dental advisory given  Plan Discussed with: CRNA  Anesthesia Plan Comments:         Anesthesia Quick Evaluation

## 2020-08-19 NOTE — Op Note (Signed)
PATIENT:  Allen Gould  PRE-OPERATIVE DIAGNOSIS: scrotal cysts  POST-OPERATIVE DIAGNOSIS:  Same  PROCEDURE:  Excision of scrotal cysts x 3  SURGEON:  Jacalyn Lefevre, MD  INDICATION:   ANESTHESIA:  General  EBL:  Minimal  DRAINS: None  LOCAL MEDICATIONS USED:  None  SPECIMEN:  Scrotal cysts  FINDINGS: 1. Scrotal cysts x 3: 4x1.5 cm right hemiscrotum, 2x1.5 cm, 3x 15 cm left hemiscrotum  DISPOSITION OF SPECIMEN:  pathology  Description of procedure: After informed consent the patient was brought to the major OR, placed on the table and administered general anesthesia. His genitalia was then sterilely prepped and draped. An official timeout was then performed.  Quarter percent Marcaine was then used to anesthetize the affected areas.  An ellipse was marked around the first scrotal cyst which is located on the right anterior hemiscrotum.  It was then sharply excised with a knife and passed off the field.  It measured approximately 4 cm x 1.5 cm.  Hemostasis was then achieved with Bovie electrocautery.  The dartos was closed with running 3-0 Vicryl and the skin was closed with interrupted 3-0 chromic.  Bacitracin was applied at the end.  This was then repeated 2 more times on the left hemiscrotum over both cyst that were visible.  Bacitracin, a sterile gauze dressing, fluff Kerlix and a scrotal support were applied. The patient tolerated the procedure well no intraoperative complications. Needle sponge and instrument counts were correct at the end of the operation.   PLAN OF CARE: Discharge to home after PACU  PATIENT DISPOSITION:  PACU - hemodynamically stable.

## 2020-08-19 NOTE — Interval H&P Note (Signed)
History and Physical Interval Note:  08/19/2020 10:32 AM  Allen Gould  has presented today for surgery, with the diagnosis of SCROTAL CYST.  The various methods of treatment have been discussed with the patient and family. After consideration of risks, benefits and other options for treatment, the patient has consented to  Procedure(s) with comments: EXCISION OF SCROTAL CYSTS (N/A) - 1 HR as a surgical intervention.  The patient's history has been reviewed, patient examined, no change in status, stable for surgery.  I have reviewed the patient's chart and labs.  Questions were answered to the patient's satisfaction.     Allen Gould

## 2020-08-19 NOTE — Anesthesia Procedure Notes (Signed)
Procedure Name: Intubation Date/Time: 08/19/2020 12:08 PM Performed by: Genelle Bal, CRNA Pre-anesthesia Checklist: Patient identified, Emergency Drugs available, Suction available and Patient being monitored Patient Re-evaluated:Patient Re-evaluated prior to induction Oxygen Delivery Method: Circle system utilized Preoxygenation: Pre-oxygenation with 100% oxygen Induction Type: IV induction Ventilation: Two handed mask ventilation required and Oral airway inserted - appropriate to patient size Laryngoscope Size: Sabra Heck and 2 Grade View: Grade I Tube type: Oral Tube size: 7.5 mm Number of attempts: 1 Airway Equipment and Method: Stylet and Oral airway Placement Confirmation: ETT inserted through vocal cords under direct vision,  positive ETCO2 and breath sounds checked- equal and bilateral Secured at: 23 cm Tube secured with: Tape Dental Injury: Teeth and Oropharynx as per pre-operative assessment  Comments: DLx1 Mil 2 by Paramedic student, grade IV view, DLx1 Mil 2 by Alford Highland CRNA, Grade I View, ATOI.

## 2020-08-19 NOTE — Transfer of Care (Signed)
Immediate Anesthesia Transfer of Care Note  Patient: Allen Gould  Procedure(s) Performed: EXCISION OF SCROTAL CYSTS (N/A )  Patient Location: PACU  Anesthesia Type:General  Level of Consciousness: awake, alert  and oriented  Airway & Oxygen Therapy: Patient Spontanous Breathing and Patient connected to face mask oxygen  Post-op Assessment: Report given to RN and Post -op Vital signs reviewed and stable  Post vital signs: Reviewed and stable  Last Vitals:  Vitals Value Taken Time  BP 150/112 08/19/20 1309  Temp    Pulse 79 08/19/20 1311  Resp 18 08/19/20 1311  SpO2 100 % 08/19/20 1311  Vitals shown include unvalidated device data.  Last Pain:  Vitals:   08/19/20 0910  TempSrc: Oral         Complications: No complications documented.

## 2020-08-20 ENCOUNTER — Telehealth: Payer: Self-pay

## 2020-08-20 ENCOUNTER — Encounter (HOSPITAL_COMMUNITY): Payer: Self-pay | Admitting: Urology

## 2020-08-20 ENCOUNTER — Other Ambulatory Visit: Payer: Self-pay | Admitting: Internal Medicine

## 2020-08-20 MED ORDER — FAMOTIDINE 10 MG PO TABS: 10.0000 mg | ORAL_TABLET | Freq: Two times a day (BID) | ORAL | 0 refills | Status: DC

## 2020-08-20 NOTE — Telephone Encounter (Signed)
Returned call to patient. States his BP readings were different in each arm this AM at 0630 15 minutes after taking BP meds (amlodipine, losartan, lasix):  Right arm 109/91 Left arm 166/101  Patient took readings again during our conversation:  Right arm 165/83 P 78 Left arm 169/87 P 74  Patient states he feels "pretty good." Denies CP, SHOB. Reports having 3 cysts removed yesterday at [facility] outpatient. Instructed patient to take BP at least 2 hours after taking meds going forward. States understanding. Patient was offered appt today to discuss elevated BP but he prefers to hear back from Provider first.

## 2020-08-20 NOTE — Telephone Encounter (Signed)
Returned call to [employer].  He notes that systolic blood pressures were 140s-160s prior to surgery. We discussed that his higher blood pressures this morning may be related to holding the losartan yesterday and having only taken his meds a few minutes before checking his blood pressure this morning. Also discussed a component of pain related hypertension.Allen Gould  He was instructed to schedule an office visit if blood pressures remain elevated in a week or two. I explained that if his blood pressures are consistently >186mHg, he should see [place].

## 2020-08-20 NOTE — Telephone Encounter (Signed)
Pls contact pt [phone] regarding blood pressure reading- pt had surgery yesterday

## 2020-08-21 LAB — SURGICAL PATHOLOGY

## 2020-09-03 LAB — HM DIABETES EYE EXAM

## 2020-09-04 ENCOUNTER — Encounter: Payer: Self-pay | Admitting: *Deleted

## 2020-09-12 ENCOUNTER — Telehealth: Payer: Self-pay | Admitting: Cardiology

## 2020-09-12 NOTE — Telephone Encounter (Signed)
Spoke with the patient who states that he was trying to get out of the car and mashed site where his pacemaker is. He states that ever since it has been sore. He states that he does have some sharper pains in that area at times that do not last long. He denies any redness or swelling. He also continues to have some pain in his left arm. He denies any shortness of breath, swelling, or dizziness. He has an office visit for a pacemaker check scheduled with Dr. Quentin Ore 5/17

## 2020-09-12 NOTE — Telephone Encounter (Signed)
Pt c/o of Chest Pain: STAT if CP now or developed within 24 hours  1. Are you having CP right now?  -No   2. Are you experiencing any other symptoms (ex. SOB, nausea, vomiting, sweating)?  -Left arm pain  3. How long have you been experiencing CP? -Since 09/06/20.  4. Is your CP continuous or coming and going?  -Coming and going  5. Have you taken Nitroglycerin?  -No  Patient states on 09/06/20 he had little space getting out of his car and the door bumped into his PPM. He states ever since he did that he has been having chest pain/aches on and off. He states he has also been experiencing a stabbing pain in his left arm.  ?

## 2020-09-16 ENCOUNTER — Other Ambulatory Visit: Payer: Self-pay

## 2020-09-16 ENCOUNTER — Ambulatory Visit (INDEPENDENT_AMBULATORY_CARE_PROVIDER_SITE_OTHER): Payer: Medicaid Other | Admitting: Emergency Medicine

## 2020-09-16 DIAGNOSIS — I441 Atrioventricular block, second degree: Secondary | ICD-10-CM | POA: Diagnosis not present

## 2020-09-16 NOTE — Telephone Encounter (Signed)
Patient reports about 2 weeks ago he was trying to get out of the car and "bumped" his PPM site. Patient denies any bruising or swelling to PPM area.  Patient is scheduled to come into device clinic today at 2:00 pm for testing on device. Manual transmission was requested and noted presenting was AS/VP 120's. Numerous PMT events logged starting on 09/14/20. Patient is currently asymptomatic. Location, date and time discussed with patient in regard to apt.

## 2020-09-17 ENCOUNTER — Telehealth: Payer: Self-pay | Admitting: Internal Medicine

## 2020-09-17 NOTE — Telephone Encounter (Signed)
RTC, patient c/o cramping in his legs, arms, and fingers for 3-4 days.  Denies any swelling, states he has been hydrating well with water.  States cramping is intermittent, describes his fingers as "locking up". Appt made for evaluation w/ blue team/Dr. Liang/tomorrow 09/18/20 @ 1545. SChaplin, RN,BSN

## 2020-09-17 NOTE — Telephone Encounter (Signed)
Pt cramping in his fingers, arms and legs (calf area)  x 3 days.  Please call patient back

## 2020-09-18 ENCOUNTER — Ambulatory Visit: Payer: Medicaid Other | Admitting: Student

## 2020-09-18 ENCOUNTER — Other Ambulatory Visit: Payer: Self-pay

## 2020-09-18 VITALS — BP 105/71 | HR 100 | Temp 98.3°F | Ht 70.0 in | Wt 312.0 lb

## 2020-09-18 DIAGNOSIS — R252 Cramp and spasm: Secondary | ICD-10-CM | POA: Diagnosis not present

## 2020-09-18 DIAGNOSIS — I5032 Chronic diastolic (congestive) heart failure: Secondary | ICD-10-CM

## 2020-09-18 DIAGNOSIS — F322 Major depressive disorder, single episode, severe without psychotic features: Secondary | ICD-10-CM | POA: Diagnosis not present

## 2020-09-18 LAB — CUP PACEART INCLINIC DEVICE CHECK
Battery Remaining Longevity: 100 mo
Battery Voltage: 2.99 V
Brady Statistic RA Percent Paced: 24 %
Brady Statistic RV Percent Paced: 98 %
Date Time Interrogation Session: 20220426140400
Implantable Lead Implant Date: 20220201
Implantable Lead Implant Date: 20220201
Implantable Lead Location: 753859
Implantable Lead Location: 753860
Implantable Pulse Generator Implant Date: 20220201
Lead Channel Impedance Value: 375 Ohm
Lead Channel Impedance Value: 475 Ohm
Lead Channel Pacing Threshold Amplitude: 0.75 V
Lead Channel Pacing Threshold Amplitude: 0.75 V
Lead Channel Pacing Threshold Pulse Width: 0.4 ms
Lead Channel Pacing Threshold Pulse Width: 0.4 ms
Lead Channel Sensing Intrinsic Amplitude: 10.4 mV
Lead Channel Sensing Intrinsic Amplitude: 5 mV
Lead Channel Setting Pacing Amplitude: 2 V
Lead Channel Setting Pacing Amplitude: 2.5 V
Lead Channel Setting Pacing Pulse Width: 0.4 ms
Lead Channel Setting Sensing Sensitivity: 2 mV
Pulse Gen Model: 2272
Pulse Gen Serial Number: 3895834

## 2020-09-18 NOTE — Progress Notes (Signed)
Pacemaker check in device clinic for recent remote reviewed and noted numerous PMT events logged.  During testing the device, patients heart rate would slow down and speed up triggering PMT template to activate. Retrograde conduction test obtained and unable to recreate PMT. PR interval was measured at 391 ms. It appears the patient is causing false PMT event by tracking at his sinus rate. Patient remains asymptomatic. Thresholds, sensing, impedances consistent with previous measurements. Device programmed to maximize longevity. AT/AF burden <1%. Device programmed at appropriate safety margins. Histogram distribution appropriate for patient activity level. Device programmed to optimize intrinsic conduction. Estimated longevity 8.4-9.0 years. Patient enrolled in remote follow-up 12/23/20. Next in-clinic check 10/07/20 w/ Dr. Quentin Ore. Patient aware of apt date and time.  The following changes were made by Aaron Edelman ([place] Rep. Were all reviewed and the "Ok" given by Dr. Quentin Ore. - RA output programmed to 2.0 V. -RV output programmed to 2.5 V. -Max Senor Rate increased from 120 bpm to 140 bpm. -Max Track Rate increased from 120 bpm to 140 bpm. -PMT detection rate increased from  bpm to bpm. - Rate Responsive AV Delay programmed from Low to Medium.

## 2020-09-18 NOTE — Patient Instructions (Addendum)
It was a pleasure seeing you in clinic. Today we discussed:    Cramping: This may be due to your fluid pill and losing too much fluid in your body. Please STOP taking you furosemide and follow up in 1 week. I will also check your labs today and will let you know the results.   If you have any questions or concerns, please call our clinic at [phone] between 9am-5pm and after hours call [phone] and ask for the internal medicine resident on call. If you feel you are having a medical emergency please call 911.   Thank you, we look forward to helping you remain healthy!

## 2020-09-19 ENCOUNTER — Telehealth: Payer: Self-pay

## 2020-09-19 LAB — BMP8+ANION GAP
Anion Gap: 16 mmol/L (ref 10.0–18.0)
BUN/Creatinine Ratio: 15 (ref 9–20)
BUN: 22 mg/dL (ref 6–24)
CO2: 25 mmol/L (ref 20–29)
Calcium: 10.2 mg/dL (ref 8.7–10.2)
Chloride: 94 mmol/L — ABNORMAL LOW (ref 96–106)
Creatinine, Ser: 1.48 mg/dL — ABNORMAL HIGH (ref 0.76–1.27)
Glucose: 98 mg/dL (ref 65–99)
Potassium: 5.2 mmol/L (ref 3.5–5.2)
Sodium: 135 mmol/L (ref 134–144)
eGFR: 55 mL/min/{1.73_m2} — ABNORMAL LOW (ref >59)

## 2020-09-19 NOTE — Telephone Encounter (Signed)
Return pt's call; no answer - left message to call the office. 

## 2020-09-19 NOTE — Telephone Encounter (Signed)
Pls contact pt regarding blood pressure [phone]

## 2020-09-19 NOTE — Telephone Encounter (Signed)
Spoke with patient-states he took his BP early this AM and it was 150/125.  Pt states is BP was "low" during ov yesterday and was concerned that it was now elevated.  [name] asked pt to take BP during call-BP 134/94 P-79 Pt denies HA, chest pain, short of breath, or vision changes.  Pt instructed to keep appt scheduled for next week and call office or go to ED should symptoms develop.  Pt wishes to speak with MD that he saw yesterday.  Will send message as requested.

## 2020-09-20 DIAGNOSIS — R252 Cramp and spasm: Secondary | ICD-10-CM | POA: Insufficient documentation

## 2020-09-20 NOTE — Assessment & Plan Note (Signed)
Patient presents with cramping of his hands, forearms, and calves for the past 2-3 weeks. States symptoms occur intermittently lasting about half an hour at a time. Notes skin of his fingers appear wrinkles and less full during these episodes. Reports medication adherence without any recent changes to medications, diet, or supplements.   On chart review noted that patient's weight has decreased by about 23 lbs since he was started on furosemide of heart failure in January. sCr has also been rising from around 0.9 to 1.5 during this time frame as well. On exam BP is BP: 105/71 with HR of 100. Skin of hands with wrinkles appearance and mildly decreased skin turgor. His symptoms may be related to over diuresis from furosemide use vs electrolyte abnormalities.  -check BMP, sCr of 1.48, K 5.2, Na 135, and Ca 10.2  - hold furosemide for now and follow up in 1 week to readjust medications

## 2020-09-20 NOTE — Assessment & Plan Note (Signed)
Patient with elevated PHQ9 during OV today. States having interpersonal difficulties with his sister and often has disagreements with her. Also concerned about financial stressors. Would like to keep medications the same at this time and declines behavioral therapy at this time. State he would feel much less stress if he had help with affording medications as he has great difficulty making sure he has enough money to pay for his prescriptions over month. Will discuss with clinical pharmacist if patient for recommendations regarding this.

## 2020-09-20 NOTE — Progress Notes (Signed)
   CC: cramping of upper and lower extremities   HPI:  [name] is a 55 y.o. male with history listed below presents for cramping of bilateral upper and lower extremities for the past several weeks. Please refer to problem based charting for further details and assessment and plan of current problem and chronic medical conditions.  Past Medical History:  Diagnosis Date  . Acute kidney injury ([place]) 09/01/2019  . Aortic stenosis    mild with mean AVG 44mHg by echo 09/2019  . Asthma   . Balanitis 08/09/2014  . Bifascicular block   . Cough 08/09/2014  . Diabetes mellitus without complication (HShoreline   . Dilated aortic root ([place]    323mby echo 09/2019  . DM (diabetes mellitus) type 2, uncontrolled, without ketoacidosis 08/10/2013  . Dyslipidemia 03/01/2014  . Financial difficulties 08/09/2014  . GERD (gastroesophageal reflux disease)   . Health care maintenance 03/01/2014  . Hypertension   . Hypertensive urgency 05/29/2017  . Hypomagnesemia 05/29/2017  . Insomnia 08/29/2013  . Morbid obesity (HCEagle [place]4/12/2013  . Near syncope 05/29/2017  . Odontogenic infection of jaw 05/29/2017  . PAC (premature atrial contraction)   . PAF (paroxysmal atrial fibrillation) (HCCommerce  . Pressure injury of skin 09/02/2019  . PVCs (premature ventricular contractions)   . Right shoulder pain 08/29/2013  . Second degree AV block, Mobitz type I   . Sepsis (HCSmoke Rise1/10/2017  . Sleep apnea    uses cpap   Review of Systems:  Negative as per HPI  Physical Exam:  Vitals:   09/18/20 1453  BP: 105/71  Pulse: 100  Temp: 98.3 F (36.8 C)  TempSrc: Oral  SpO2: 99%  Weight: (!) 312 lb (141.5 kg)  Height: 5' 10"  (1.778 m)   Constitutional: Appears well-developed and well-nourished. No distress.  HENT: Normocephalic and atraumatic,moist mucous membranes Cardiovascular: Normal rate, regular rhythm, no murmurs, rubs, gallops.  No JVD. Respiratory: No respiratory distress, Effort is normal.  Lungs are clear to  auscultation bilaterally. GI: Nondistended, soft, nontender to palpation, normal active bowel sounds Musculoskeletal: Normal bulk and tone.  No peripheral edema noted. Neurological: Is alert and oriented x4, no apparent focal deficits noted. Skin: Warm and dry.  Mild decreased skin tugor Psychiatric: Normal mood and affect. Behavior is normal. Judgment and thought content normal.    Assessment & Plan:   See Encounters Tab for problem based charting.  Patient discussed with Dr. WiJimmye Norman

## 2020-09-22 NOTE — Telephone Encounter (Signed)
Returned call to patient. States he is feeling well. BP has been in 120s over the weekend. Confirmed follow up with Dr. Court Joy on 09/25/2020. All questions answered at this time.

## 2020-09-23 ENCOUNTER — Ambulatory Visit (INDEPENDENT_AMBULATORY_CARE_PROVIDER_SITE_OTHER): Payer: Medicaid Other

## 2020-09-23 DIAGNOSIS — I495 Sick sinus syndrome: Secondary | ICD-10-CM | POA: Diagnosis not present

## 2020-09-23 LAB — CUP PACEART REMOTE DEVICE CHECK
Battery Remaining Longevity: 101 mo
Battery Remaining Percentage: 95.5 %
Battery Voltage: 2.99 V
Brady Statistic AP VP Percent: 9.4 %
Brady Statistic AP VS Percent: 1 %
Brady Statistic AS VP Percent: 90 %
Brady Statistic AS VS Percent: 1 %
Brady Statistic RA Percent Paced: 8.3 %
Brady Statistic RV Percent Paced: 99 %
Date Time Interrogation Session: 20220503040020
Implantable Lead Implant Date: 20220201
Implantable Lead Implant Date: 20220201
Implantable Lead Location: 753859
Implantable Lead Location: 753860
Implantable Pulse Generator Implant Date: 20220201
Lead Channel Impedance Value: 360 Ohm
Lead Channel Impedance Value: 480 Ohm
Lead Channel Pacing Threshold Amplitude: 0.75 V
Lead Channel Pacing Threshold Amplitude: 0.75 V
Lead Channel Pacing Threshold Pulse Width: 0.4 ms
Lead Channel Pacing Threshold Pulse Width: 0.4 ms
Lead Channel Sensing Intrinsic Amplitude: 11.3 mV
Lead Channel Sensing Intrinsic Amplitude: 4.5 mV
Lead Channel Setting Pacing Amplitude: 2 V
Lead Channel Setting Pacing Amplitude: 2.5 V
Lead Channel Setting Pacing Pulse Width: 0.4 ms
Lead Channel Setting Sensing Sensitivity: 2 mV
Pulse Gen Model: 2272
Pulse Gen Serial Number: 3895834

## 2020-09-25 ENCOUNTER — Other Ambulatory Visit: Payer: Self-pay

## 2020-09-25 ENCOUNTER — Ambulatory Visit (INDEPENDENT_AMBULATORY_CARE_PROVIDER_SITE_OTHER): Payer: Medicaid Other | Admitting: Internal Medicine

## 2020-09-25 DIAGNOSIS — F322 Major depressive disorder, single episode, severe without psychotic features: Secondary | ICD-10-CM | POA: Diagnosis not present

## 2020-09-25 DIAGNOSIS — R252 Cramp and spasm: Secondary | ICD-10-CM

## 2020-09-25 DIAGNOSIS — K219 Gastro-esophageal reflux disease without esophagitis: Secondary | ICD-10-CM | POA: Diagnosis not present

## 2020-09-25 MED ORDER — DULOXETINE HCL 30 MG PO CPEP: 30.0000 mg | ORAL_CAPSULE | Freq: Every day | ORAL | 0 refills | Status: DC

## 2020-09-25 MED ORDER — PANTOPRAZOLE SODIUM 40 MG PO TBEC: 40.0000 mg | DELAYED_RELEASE_TABLET | Freq: Every day | ORAL | 3 refills | Status: DC

## 2020-09-26 ENCOUNTER — Encounter: Payer: Self-pay | Admitting: Internal Medicine

## 2020-09-26 NOTE — Progress Notes (Signed)
Internal Medicine Clinic Attending ? ?Case discussed with Dr. Liang  At the time of the visit.  We reviewed the resident?s history and exam and pertinent patient test results.  I agree with the assessment, diagnosis, and plan of care documented in the resident?s note. ? ?

## 2020-09-26 NOTE — Assessment & Plan Note (Signed)
I do agree with Dr. Lisabeth Devoid that his symptoms, along with AKI, were likely attributable to overdiuresis. Unfortunately, I am also concerned that holding his lasix completely, will place him at high risk for a CHF exacerbation and possible hospital admission. -since his symptoms have improved with just taking the lasix every other day, I would like him to continue to do this and follow up with me in June at which time we can repeat labs -I strongly encouraged him to monitor his fluid status closely with daily weights. He understands to call the clinic if he gains more than 3 pounds or if he develops respiratory symptoms, orthopnea, etc

## 2020-09-26 NOTE — Progress Notes (Signed)
Internal Medicine Clinic Attending  Case discussed with Dr. [name]  At the time of the visit.  We reviewed the resident's history and exam and pertinent patient test results.  I agree with the assessment, diagnosis, and plan of care documented in the resident's note.  

## 2020-09-26 NOTE — Assessment & Plan Note (Signed)
Unclear why insurance is not covering pepcid but will try protonix and see if that is covered.

## 2020-09-26 NOTE — Progress Notes (Signed)
[facility] Telephone Encounter Continuity Care Appointment  HPI:  This telephone encounter was created for [name] on 09/26/2020 for follow up of his LOV last week for cramping in his extremities .  He was since by my colleague 09/18/20, at which time it was thought that his symptoms were attributable to dehydration from overdiuresis which was supported by decreased weight and AKI. He was instructed to hold lasix and return in one week for follow up. He notes that the cramping has improved and is not that bothersome to him now.  He was understandably concerned about going into a heart failure exacerbation if he completely held the lasix, so he has just been taking it every other day. He has been monitoring his weight closely and has not had a significant increase since doing this.  He also wanted to discuss titrating up his duloxetine. He is currently on 34m and notes that he does feel like it has helped but is still struggling with feeling down. He denies suicidal thoughts or plans.   He also notes that his insurance is not covering the pepcid.    Past Medical History:  Past Medical History:  Diagnosis Date  . Acute kidney injury ([place] 09/01/2019  . Aortic stenosis    mild with mean AVG 183mg by echo 09/2019  . Asthma   . Balanitis 08/09/2014  . Bifascicular block   . Cough 08/09/2014  . Diabetes mellitus without complication (HCBaggs  . Dilated aortic root (HCTarkio   3995my echo 09/2019  . DM (diabetes mellitus) type 2, uncontrolled, without ketoacidosis 08/10/2013  . Dyslipidemia 03/01/2014  . Financial difficulties 08/09/2014  . GERD (gastroesophageal reflux disease)   . Health care maintenance 03/01/2014  . Hypertension   . Hypertensive urgency 05/29/2017  . Hypomagnesemia 05/29/2017  . Insomnia 08/29/2013  . Morbid obesity (HCCGreenv[place]/12/2013  . Near syncope 05/29/2017  . Odontogenic infection of jaw 05/29/2017  . PAC (premature atrial contraction)   . PAF  (paroxysmal atrial fibrillation) (HCCPut[place] . Pressure injury of skin 09/02/2019  . PVCs (premature ventricular contractions)   . Right shoulder pain 08/29/2013  . Second degree AV block, Mobitz type I   . Sepsis (HCCMer[place]/10/2017  . Sleep apnea    uses cpap      Assessment / Plan / Recommendations:   Follow up for cramping. I do agree with Dr. LiaLisabeth Devoidat his symptoms, along with AKI, were likely attributable to overdiuresis. Unfortunately, I am also concerned that holding his lasix completely, will place him at high risk for a CHF exacerbation and possible hospital admission. -since his symptoms have improved with just taking the lasix every other day, I would like him to continue to do this and follow up with me in June at which time we can repeat labs -I strongly encouraged him to monitor his fluid status closely with daily weights. He understands to call the clinic if he gains more than 3 pounds or if he develops respiratory symptoms, orthopnea, etc  Severe depression. PHQ 9 was 25 at his LOV on 4/28. Pt is continuing to struggle with depressive symptoms. -will increase Cymbalta to 38m14m/u with me in June for re-evaluation  GERD. Unclear why insurance is not covering pepcid but will try protonix and see if that is covered.  As always, pt is advised that if symptoms worsen or new symptoms arise, they should go to an urgent care facility or to to ER  for further evaluation.   Consent and Medical Decision Making:   Patient discussed with Dr. Evette Doffing  This is a telephone encounter between [employer] and [employer] on 09/26/2020 for hand cramping follow up. The visit was conducted with the patient located at home and [employer] at [facility] [place]. The patient's identity was confirmed using their DOB and current address. The patient has consented to being evaluated through a telephone encounter and understands the associated risks (an examination cannot be done and the patient may need to come in  for an appointment) / benefits (allows the patient to remain at home, decreasing exposure to coronavirus). I personally spent 10 minutes on medical discussion.    [name], MD Internal Medicine Resident PGY-2 [facility] Pager: [phone] 09/26/2020 10:36 AM

## 2020-09-26 NOTE — Assessment & Plan Note (Signed)
PHQ 9 was 25 at his LOV on 4/28. Pt is continuing to struggle with depressive symptoms. -will increase Cymbalta to 17m -f/u with me in June for re-evaluation

## 2020-09-29 ENCOUNTER — Ambulatory Visit (HOSPITAL_COMMUNITY): Payer: Medicare Other | Attending: Cardiology

## 2020-09-29 ENCOUNTER — Other Ambulatory Visit: Payer: Self-pay

## 2020-09-29 DIAGNOSIS — I77810 Thoracic aortic ectasia: Secondary | ICD-10-CM

## 2020-09-29 LAB — ECHOCARDIOGRAM COMPLETE
Area-P 1/2: 4.36 cm2
MV VTI: 2.32 cm2
S' Lateral: 3.1 cm

## 2020-09-29 MED ORDER — PERFLUTREN LIPID MICROSPHERE
1.0000 mL | INTRAVENOUS | Status: AC | PRN
  Administered 2020-09-29: 2 mL via INTRAVENOUS

## 2020-09-30 ENCOUNTER — Encounter: Payer: Self-pay | Admitting: Cardiology

## 2020-09-30 DIAGNOSIS — I77819 Aortic ectasia, unspecified site: Secondary | ICD-10-CM | POA: Insufficient documentation

## 2020-10-01 ENCOUNTER — Telehealth: Payer: Self-pay | Admitting: Cardiology

## 2020-10-01 ENCOUNTER — Telehealth: Payer: Self-pay

## 2020-10-01 DIAGNOSIS — I5032 Chronic diastolic (congestive) heart failure: Secondary | ICD-10-CM

## 2020-10-01 DIAGNOSIS — I517 Cardiomegaly: Secondary | ICD-10-CM

## 2020-10-01 NOTE — Telephone Encounter (Signed)
[name], MD  09/30/2020 5:07 PM EDT      Echo showed low normal heart function with severely thickened heart muscle and stiffness of heart muscle, mild mitral stenosis, mildly dilated aortic root and ascending aorta at 51m >> normal on chest CTA in Jan 2022. Repeat echo in 1 year   TSueanne Margarita MD  09/30/2020 5:08 PM EDT      Given his severe LVH which is likely related to HTN but need to consider amyloid in setting of CKD -please order a PYP scan as well as an SPEP and UPEP   The patient has been notified of the result and verbalized understanding.  All questions (if any) were answered. CAntonieta Iba RN 10/01/2020 2:59 PM

## 2020-10-01 NOTE — Telephone Encounter (Signed)
Spoke with patient about transferring medications to either [place] or [facility] pharmacy & using the charge account option to help pickup medications. Pt expressed understanding and said he would most likely take that option.

## 2020-10-01 NOTE — Telephone Encounter (Signed)
° °  Pt is returning call to get echo result °

## 2020-10-07 ENCOUNTER — Other Ambulatory Visit: Payer: Medicaid Other

## 2020-10-07 ENCOUNTER — Encounter: Payer: Self-pay | Admitting: Cardiology

## 2020-10-07 ENCOUNTER — Encounter (HOSPITAL_COMMUNITY): Payer: Self-pay | Admitting: Cardiology

## 2020-10-07 ENCOUNTER — Ambulatory Visit: Payer: Medicaid Other | Admitting: Cardiology

## 2020-10-07 ENCOUNTER — Other Ambulatory Visit: Payer: Self-pay

## 2020-10-07 VITALS — BP 118/82 | HR 86 | Ht 70.0 in | Wt 307.0 lb

## 2020-10-07 DIAGNOSIS — I440 Atrioventricular block, first degree: Secondary | ICD-10-CM

## 2020-10-07 DIAGNOSIS — I498 Other specified cardiac arrhythmias: Secondary | ICD-10-CM

## 2020-10-07 DIAGNOSIS — I5032 Chronic diastolic (congestive) heart failure: Secondary | ICD-10-CM

## 2020-10-07 DIAGNOSIS — I444 Left anterior fascicular block: Secondary | ICD-10-CM

## 2020-10-07 DIAGNOSIS — I517 Cardiomegaly: Secondary | ICD-10-CM

## 2020-10-07 DIAGNOSIS — I453 Trifascicular block: Secondary | ICD-10-CM

## 2020-10-07 DIAGNOSIS — I480 Paroxysmal atrial fibrillation: Secondary | ICD-10-CM

## 2020-10-07 DIAGNOSIS — Z950 Presence of cardiac pacemaker: Secondary | ICD-10-CM

## 2020-10-07 DIAGNOSIS — I495 Sick sinus syndrome: Secondary | ICD-10-CM | POA: Diagnosis not present

## 2020-10-07 DIAGNOSIS — I4510 Unspecified right bundle-branch block: Secondary | ICD-10-CM | POA: Diagnosis not present

## 2020-10-07 DIAGNOSIS — I77810 Thoracic aortic ectasia: Secondary | ICD-10-CM

## 2020-10-07 NOTE — Progress Notes (Signed)
Electrophysiology Office Follow up Visit Note:    Date:  10/07/2020   ID:  Allen Gould, DOB Feb 17, 1966, MRN [id number]  PCP:  [name], MD  [facility] Cardiologist:  Fransico Him, MD  [facility] Electrophysiologist:  Vickie Epley, MD    Interval History:    Allen Gould is a 55 y.o. male who presents for a follow up visit.  He underwent a dual-chamber permanent pacemaker implant June 24, 2020.  His pacemaker was implanted for symptomatic bradycardia and junctional rhythm.  He is recently had an amyloid evaluation ordered because of a severely thickened left ventricle on echo.    Past Medical History:  Diagnosis Date  . Acquired dilation of ascending aorta and aortic root (HCC)    61m by echo 09/2020 but normal on Chest CTA 05/2020  . Acute kidney injury ([place]ock[place] 09/01/2019  . Aortic stenosis    mild with mean AVG 119mg by echo 09/2019  . Asthma   . Balanitis 08/09/2014  . Bifascicular block   . Cough 08/09/2014  . Diabetes mellitus without complication (HCFort Dick  . Dilated aortic root (HCHopewell   3982my echo 09/2019  . DM (diabetes mellitus) type 2, uncontrolled, without ketoacidosis 08/10/2013  . Dyslipidemia 03/01/2014  . Financial difficulties 08/09/2014  . GERD (gastroesophageal reflux disease)   . Health care maintenance 03/01/2014  . Hypertension   . Hypertensive urgency 05/29/2017  . Hypomagnesemia 05/29/2017  . Insomnia 08/29/2013  . Morbid obesity (HCCHemet/12/2013  . Near syncope 05/29/2017  . Odontogenic infection of jaw 05/29/2017  . PAC (premature atrial contraction)   . PAF (paroxysmal atrial fibrillation) (HCCMonona . Pressure injury of skin 09/02/2019  . PVCs (premature ventricular contractions)   . Right shoulder pain 08/29/2013  . Second degree AV block, Mobitz type I   . Sepsis (HCCChatmoss/10/2017  . Sleep apnea    uses cpap    Past Surgical History:  Procedure Laterality Date  . APPENDECTOMY    . MASS EXCISION N/A 08/19/2020   Procedure: EXCISION OF  SCROTAL CYSTS;  Surgeon: PacRobley FriesD;  Location: [facility];  Service: Urology;  Laterality: N/A;  1 HR  . PACEMAKER IMPLANT N/A 06/24/2020   Procedure: PACEMAKER IMPLANT;  Surgeon: LamVickie EpleyD;  Location: [facility] [place] [facility];  Service: Cardiovascular;  Laterality: N/A;    Current Medications: No outpatient medications have been marked as taking for the 10/07/20 encounter (Appointment) with LamVickie EpleyD.     Allergies:   Shellfish allergy   Social History   Socioeconomic History  . Marital status: Single    Spouse name: Not on file  . Number of children: Not on file  . Years of education: Not on file  . Highest education level: Not on file  Occupational History  . Not on file  Tobacco Use  . Smoking status: Former Smoker    Types: Cigarettes    Quit date: 04/24/2005    Years since quitting: 15.4  . Smokeless tobacco: Never Used  Vaping Use  . Vaping Use: Never used  Substance and Sexual Activity  . Alcohol use: Not Currently    Alcohol/week: 2.0 standard drinks    Types: 2 Cans of beer per week    Comment: Last drink 8 months ago  . Drug use: No  . Sexual activity: Not on file  Other Topics Concern  . Not on file  Social History Narrative  . Not on file  Social Determinants of Health   Financial Resource Strain: Not on file  Food Insecurity: Not on file  Transportation Needs: Not on file  Physical Activity: Not on file  Stress: Not on file  Social Connections: Not on file     Family History: The patient's family history includes Diabetes in his sister; Heart disease in an other family member; Hyperlipidemia in his sister; Kidney disease in his mother.  ROS:   Please see the history of present illness.    All other systems reviewed and are negative.  EKGs/Labs/Other Studies Reviewed:    The following studies were reviewed today:  Sep 29, 2020 echo Left ventricular function low normal, 30% Right ventricular function normal Trivial  MR Aortic root 42 mm  EKG:  The ekg ordered today demonstrates AV sequential pacing  Oct 07, 2020 in clinic device interrogation personally reviewed Lead parameters are stable Battery longevity 10 years V pacing 98%, a pacing 15% Underlying rhythm with a very long PR interval of 370 ms Turned on ventricular auto capture Turned off VIP  Recent Labs: 10/10/2019: TSH 2.150 10/11/2019: Magnesium 1.7 06/02/2020: B Natriuretic Peptide 93.9 08/12/2020: ALT 19; Hemoglobin 13.3; Platelets 207 09/18/2020: BUN 22; Creatinine, Ser 1.48; Potassium 5.2; Sodium 135  Recent Lipid Panel    Component Value Date/Time   CHOL 92 (L) 04/08/2020 0817   TRIG 87 04/08/2020 0817   HDL 40 04/08/2020 0817   CHOLHDL 2.3 04/08/2020 0817   CHOLHDL 6.0 02/28/2014 1508   VLDL NOT CALC 02/28/2014 1508   LDLCALC 35 04/08/2020 0817   LDLDIRECT 108 (H) 03/01/2014 0409    Physical Exam:    VS:  There were no vitals taken for this visit.    Wt Readings from Last 3 Encounters:  09/18/20 (!) 312 lb (141.5 kg)  08/19/20 (!) 311 lb 12.8 oz (141.4 kg)  08/12/20 (!) 311 lb 12.8 oz (141.4 kg)     GEN:  Well nourished, well developed in no acute distress HEENT: Normal NECK: No JVD; No carotid bruits LYMPHATICS: No lymphadenopathy CARDIAC: RRR, no murmurs, rubs, gallops.  Pacemaker pocket well-healed without overlying pain RESPIRATORY:  Clear to auscultation without rales, wheezing or rhonchi  ABDOMEN: Soft, non-tender, non-distended MUSCULOSKELETAL:  No edema; No deformity  SKIN: Warm and dry NEUROLOGIC:  Alert and oriented x 3 PSYCHIATRIC:  Normal affect   ASSESSMENT:    1. Junctional rhythm   2. Tachycardia-bradycardia syndrome ([place])   3. Trifascicular block   4. Right bundle branch block   5. LAFB (left anterior fascicular block)   6. First degree AV block    PLAN:    In order of problems listed above:  1. Junctional rhythm/tachybradycardia/AV conduction abnormalities now post permanent pacemaker  implant Doing well after implant of his pacemaker. Pacemaker functioning well on today's check.  2.  Tachybradycardia/paroxysmal atrial fibrillation Low A. fib burden on his device interrogations Continue Eliquis for stroke prophylaxis   Total time spent with patient today 35 minutes. This includes reviewing records, evaluating the patient and coordinating care.   Medication Adjustments/Labs and Tests Ordered: Current medicines are reviewed at length with the patient today.  Concerns regarding medicines are outlined above.  No orders of the defined types were placed in this encounter.  No orders of the defined types were placed in this encounter.    Signed, Lars Mage, MD, [facility], [facility] 10/07/2020 7:56 AM    Electrophysiology [facility]

## 2020-10-07 NOTE — Patient Instructions (Signed)
Medication Instructions:  Your physician recommends that you continue on your current medications as directed. Please refer to the Current Medication list given to you today.  Labwork: None ordered.  Testing/Procedures: None ordered.  Follow-Up:  Your physician wants you to follow-up in: 9 months with Lars Mage, MD    You will receive a reminder letter in the mail two months in advance. If you don't receive a letter, please call our office to schedule the follow-up appointment.   Any Other Special Instructions Will Be Listed Below (If Applicable).  If you need a refill on your cardiac medications before your next appointment, please call your pharmacy.

## 2020-10-08 LAB — PROTEIN ELECTROPHORESIS, URINE REFLEX
Albumin ELP, Urine: 82.7 %
Alpha-1-Globulin, U: 0.3 %
Alpha-2-Globulin, U: 2.6 %
Beta Globulin, U: 9.8 %
Gamma Globulin, U: 4.5 %
Protein, Ur: 38.9 mg/dL

## 2020-10-08 LAB — PROTEIN ELECTROPHORESIS, SERUM
A/G Ratio: 1.1 (ref 0.7–1.7)
Albumin ELP: 4.2 g/dL (ref 2.9–4.4)
Alpha 1: 0.3 g/dL (ref 0.0–0.4)
Alpha 2: 1 g/dL (ref 0.4–1.0)
Beta: 1.2 g/dL (ref 0.7–1.3)
Gamma Globulin: 1.5 g/dL (ref 0.4–1.8)
Globulin, Total: 3.9 g/dL (ref 2.2–3.9)
Total Protein: 8.1 g/dL (ref 6.0–8.5)

## 2020-10-14 NOTE — Progress Notes (Signed)
Remote pacemaker transmission.   

## 2020-10-23 ENCOUNTER — Telehealth: Payer: Self-pay | Admitting: *Deleted

## 2020-10-23 ENCOUNTER — Encounter: Payer: Medicaid Other | Admitting: Internal Medicine

## 2020-10-23 ENCOUNTER — Telehealth: Payer: Self-pay | Admitting: Cardiology

## 2020-10-23 ENCOUNTER — Telehealth: Payer: Self-pay | Admitting: Internal Medicine

## 2020-10-23 NOTE — Telephone Encounter (Signed)
Hopeful he can make it in to be seen today. Otherwise, I agree that he should seek further evaluation in the ED if unable to be seen in the office today. Thank you

## 2020-10-23 NOTE — Telephone Encounter (Signed)
STAT if HR is under 50 or over 120 (normal HR is 60-100 beats per minute)  1) What is your heart rate? 100/81 HR 135 this morning  2) Do you have a log of your heart rate readings (document readings)? Yesterday 115/79 HR 125  3) Do you have any other symptoms? Tired, sometimes gets SOB and headache   Patient states his HR has been elevated and he has been getting SOB and a headache. He is not SOB now.

## 2020-10-23 NOTE — Telephone Encounter (Signed)
Spoke with pt who reports HR of 135 upon checking his BP this am.  Pt states he can feel his heart racing and does report some SOB but denies CP or dizziness.  Pt advised to seen PPM transmission and will have device RN review.  Pt states he will need assistance with sending transmission.  Will forward to device for further assistance with sending transmission.  Reviewed ED precautions.  Pt verbalizes understanding and agrees with current plan.

## 2020-10-23 NOTE — Telephone Encounter (Signed)
Patient called in stating he checks his BP/P every AM. Yesterday BP 115/79 P 125. Today BP 100/81 P 135. Denies CP. Does endorse some SHOB. Patient given appt today at 2:15 with PCP.

## 2020-10-23 NOTE — Telephone Encounter (Signed)
Received TC from patient who states he was given an appt for this afternoon at 1415, but he doesn't think he can make it because he won't have a ride. Pt confirms HR of 125-135 with intermittent SOB.  He denies chest pain.  He does state he hasn't been feeling well overall in a week. RN informed patient with his symptoms, he needs to be evaluated today and he cannot make it to [facility] for his appt, he should present to the ED or urgent care later today.   He verbalized understanding, he states he will try to make it to [facility] for his appt today. SChaplin, RN,BSN

## 2020-10-23 NOTE — Telephone Encounter (Signed)
Manual transmission received/ Reviewed.    Presenting Rate/ Rhythm appear AT- ASVP 135.  Pt reports he has felt this way for a couple of days now

## 2020-10-23 NOTE — Telephone Encounter (Signed)
I reached out and was able to speak with him. He notes that his heart rate has been slowly increasing over the past couple of weeks. Over the past couple of days, his blood pressure has been decreasing and he has developed palpitations and shortness of breath. Denies chest pain.  He was scheduled to see me in the clinic today however was unable to make it due to transportation issues.  He had also reached out to the cardiology office who was hoping that he could transmit his pacemaker data however pt is still awaiting a call back from their office for instructions as he is unsure on how to do this. I encouraged him to see further evaluation in the ED which he is agreeable to.   [name], MD Internal Medicine Resident PGY-2 [facility] Pager: [phone] 10/23/2020 2:18 PM

## 2020-10-28 ENCOUNTER — Telehealth (HOSPITAL_COMMUNITY): Payer: Self-pay | Admitting: *Deleted

## 2020-10-28 ENCOUNTER — Encounter: Payer: Self-pay | Admitting: Emergency Medicine

## 2020-10-28 NOTE — Telephone Encounter (Signed)
Patient notified that Dr Quentin Ore would like him to see Allen Cater PA to assess his ICD for PMT and address any timing issues. Patient reports he has limited transportation. Will expect call from scheduler.

## 2020-10-28 NOTE — Telephone Encounter (Signed)
Patient given detailed instructions per Amyloid Study Information Sheet for the test on 11/05/20 at 1100. Patient notified to arrive 15 minutes early and that it is imperative to arrive on time for appointment to keep from having the test rescheduled.  If you need to cancel or reschedule your appointment, please call the office within 24 hours of your appointment. . Patient verbalized understanding.Arwyn Besaw, Ranae Palms

## 2020-10-30 NOTE — Progress Notes (Signed)
Electrophysiology Office Note Date: 10/31/2020  ID:  Allen Gould, DOB Nov 06, 1965, MRN [id number]  PCP: [name], MD Primary Cardiologist: Fransico Him, MD Electrophysiologist: Vickie Epley, MD   CC: Pacemaker follow-up  Allen Gould is a 55 y.o. male seen today for Vickie Epley, MD for acute visit due to tachycardia on device remote .  Since last being seen in our clinic the patient reports doing OK. He has not felt any further tachy-palpitations.  He has chronic SOB with min to mod exertion. He denies chest pain, dizziness, syncope, or near syncope. He has mild lightheadedness with rapid standing. He wears his CPAP nightly.   Device History: St. Jude Dual Chamber PPM implanted 06/24/20 for symptomatic bradycardia/junctional rhythm  Past Medical History:  Diagnosis Date   Acquired dilation of ascending aorta and aortic root ([place])    73m by echo 09/2020 but normal on Chest CTA 05/2020   Acute kidney injury ([place] 09/01/2019   Aortic stenosis    mild with mean AVG 178mg by echo 09/2019   Asthma    Balanitis 08/09/2014   Bifascicular block    Cough 08/09/2014   Diabetes mellitus without complication ([facility]   Dilated aortic root ([facility]CO[place]   3964my echo 09/2019   DM (diabetes mellitus) type 2, uncontrolled, without ketoacidosis 08/10/2013   Dyslipidemia 03/01/2014   Financial difficulties 08/09/2014   GERD (gastroesophageal reflux disease)    Health care maintenance 03/01/2014   Hypertension    Hypertensive urgency 05/29/2017   Hypomagnesemia 05/29/2017   Insomnia 08/29/2013   Morbid obesity ([place]/12/2013   Near syncope 05/29/2017   Odontogenic infection of jaw 05/29/2017   PAC (premature atrial contraction)    PAF (paroxysmal atrial fibrillation) ([place]  Pressure injury of skin 09/02/2019   PVCs (premature ventricular contractions)    Right shoulder pain 08/29/2013   Second degree AV block, Mobitz type I    Sepsis ([place]/10/2017   Sleep apnea    uses cpap   Past  Surgical History:  Procedure Laterality Date   APPENDECTOMY     MASS EXCISION N/A 08/19/2020   Procedure: EXCISION OF SCROTAL CYSTS;  Surgeon: PacRobley FriesD;  Location: [facility];  Service: Urology;  Laterality: N/A;  1 HR   PACEMAKER IMPLANT N/A 06/24/2020   Procedure: PACEMAKER IMPLANT;  Surgeon: LamVickie EpleyD;  Location: [facility];  Service: Cardiovascular;  Laterality: N/A;    Current Outpatient Medications  Medication Sig Dispense Refill   Accu-Chek Softclix Lancets lancets Check blood sugar 3 times a day 100 each 12   amLODipine (NORVASC) 10 MG tablet Take 1 tablet (10 mg total) by mouth daily. 90 tablet 3   apixaban (ELIQUIS) 5 MG TABS tablet Take 1 tablet (5 mg total) by mouth 2 (two) times daily. 180 tablet 3   aspirin 81 MG EC tablet Take 81 mg by mouth daily. Swallow whole.     atorvastatin (LIPITOR) 80 MG tablet Take 1 tablet (80 mg total) by mouth daily. 90 tablet 3   DULoxetine (CYMBALTA) 30 MG capsule Take 1 capsule (30 mg total) by mouth daily. 90 capsule 0   furosemide (LASIX) 40 MG tablet Take 1 tablet (40 mg total) by mouth daily. 30 tablet 11   gabapentin (NEURONTIN) 300 MG capsule Take 1 capsule (300 mg total) by mouth in the morning, at noon, and at bedtime. 270 capsule 1   glucose blood (ACCU-CHEK GUIDE) test strip  Check blood sugar 3 times a day 100 each 12   HUMALOG 100 UNIT/ML injection Inject 4 Units into the skin 3 (three) times daily as needed (blood sugar over 200).     ibuprofen (ADVIL) 200 MG tablet Take 600 mg by mouth every 8 (eight) hours as needed for mild pain.     losartan (COZAAR) 50 MG tablet Take 1 tablet (50 mg total) by mouth daily. 90 tablet 1   metFORMIN (GLUCOPHAGE-XR) 750 MG 24 hr tablet Take 750 mg by mouth in the morning and at bedtime.     metoprolol succinate (TOPROL XL) 25 MG 24 hr tablet Take 1 tablet (25 mg total) by mouth at bedtime. 90 tablet 3   pantoprazole (PROTONIX) 40 MG tablet Take 1 tablet (40 mg total) by  mouth daily. 90 tablet 3   vitamin B-12 (CYANOCOBALAMIN) 1000 MCG tablet Take 1,000 mcg by mouth daily.     No current facility-administered medications for this visit.    Allergies:   Shellfish allergy   Social History: Social History   Socioeconomic History   Marital status: Single    Spouse name: Not on file   Number of children: Not on file   Years of education: Not on file   Highest education level: Not on file  Occupational History   Not on file  Tobacco Use   Smoking status: Former    Pack years: 0.00    Types: Cigarettes    Quit date: 04/24/2005    Years since quitting: 15.5   Smokeless tobacco: Never  Vaping Use   Vaping Use: Never used  Substance and Sexual Activity   Alcohol use: Not Currently    Alcohol/week: 2.0 standard drinks    Types: 2 Cans of beer per week    Comment: Last drink 8 months ago   Drug use: No   Sexual activity: Not on file  Other Topics Concern   Not on file  Social History Narrative   Not on file   Social Determinants of Health   Financial Resource Strain: Not on file  Food Insecurity: Not on file  Transportation Needs: Not on file  Physical Activity: Not on file  Stress: Not on file  Social Connections: Not on file  Intimate Partner Violence: Not on file    Family History: Family History  Problem Relation Age of Onset   Kidney disease Mother    Diabetes Sister    Hyperlipidemia Sister    Heart disease Other      Review of Systems: All other systems reviewed and are otherwise negative except as noted above.  Physical Exam: Vitals:   10/31/20 0836  BP: 118/72  Pulse: 89  Resp: (!) 97  Weight: (!) 314 lb 4 oz (142.5 kg)  Height: 5' 10"  (1.778 m)     GEN- The patient is well appearing, alert and oriented x 3 today.   HEENT: normocephalic, atraumatic; sclera clear, conjunctiva pink; hearing intact; oropharynx clear; neck supple  Lungs- Clear to ausculation bilaterally, normal work of breathing.  No wheezes, rales,  rhonchi Heart- Regular rate and rhythm, no murmurs, rubs or gallops  GI- soft, non-tender, non-distended, bowel sounds present  Extremities- no clubbing or cyanosis. No edema MS- no significant deformity or atrophy Skin- warm and dry, no rash or lesion; PPM pocket well healed Psych- euthymic mood, full affect Neuro- strength and sensation are intact  PPM Interrogation- reviewed in detail today,  See PACEART report  EKG:  EKG is ordered today.  The ekg ordered today shows V pacing at 89 bpm, QRS 170 ms. In RBBB type pattern.  Recent Labs: 06/02/2020: B Natriuretic Peptide 93.9 08/12/2020: ALT 19; Hemoglobin 13.3; Platelets 207 09/18/2020: BUN 22; Creatinine, Ser 1.48; Potassium 5.2; Sodium 135   Wt Readings from Last 3 Encounters:  10/31/20 (!) 314 lb 4 oz (142.5 kg)  10/07/20 (!) 307 lb (139.3 kg)  09/18/20 (!) 312 lb (141.5 kg)     Other studies Reviewed: Additional studies/ records that were reviewed today include: Previous EP office notes, Previous remote checks, Most recent labwork.   Assessment and Plan:  1.  Junctional rhythm/tachybrady/AV conduction abnormalities  s/p St. Jude PPM  Normal PPM function See Pace Art report No changes today No V-A conduction noted today with testing. EGMs include the start of an episode that shows the atrial beat as the leading beat.   2. PAF Device interrogation shows multiple short episodes of AT vs brief AF. Burden <1%. Longest episodes on todays interrogation < 2 minutes.  Continue eliquis for CHA2DS2/VASC of at least 4.   Will add back toprol 25 mg qhs.    3. Chronic diastolic CHF Echo 0/11/3708 LVEF 50-55% Pending amyloid study 11/05/2020  4. HTN If BP drops back on BB, would stop amlodipine.   Current medicines are reviewed at length with the patient today.   The patient does not have concerns regarding his medicines.  The following changes were made today:  Toprol  Labs/ tests ordered today include:  Orders Placed This  Encounter  Procedures   CBC   Basic metabolic panel   EKG 62-IRSW     Disposition:   Follow up with Dr. Quentin Ore or APP in 3 Months    Signed, Annamaria Helling  10/31/2020 9:10 AM  [facility] [address] [place] [address] [phone] (office) [phone] (fax)

## 2020-10-31 ENCOUNTER — Encounter: Payer: Self-pay | Admitting: Student

## 2020-10-31 ENCOUNTER — Other Ambulatory Visit: Payer: Self-pay

## 2020-10-31 ENCOUNTER — Ambulatory Visit (INDEPENDENT_AMBULATORY_CARE_PROVIDER_SITE_OTHER): Payer: Medicaid Other | Admitting: Student

## 2020-10-31 VITALS — BP 118/72 | HR 89 | Ht 70.0 in | Wt 314.2 lb

## 2020-10-31 DIAGNOSIS — I480 Paroxysmal atrial fibrillation: Secondary | ICD-10-CM

## 2020-10-31 DIAGNOSIS — I498 Other specified cardiac arrhythmias: Secondary | ICD-10-CM

## 2020-10-31 DIAGNOSIS — I495 Sick sinus syndrome: Secondary | ICD-10-CM | POA: Diagnosis not present

## 2020-10-31 DIAGNOSIS — I444 Left anterior fascicular block: Secondary | ICD-10-CM | POA: Diagnosis not present

## 2020-10-31 LAB — BASIC METABOLIC PANEL
BUN/Creatinine Ratio: 14 (ref 9–20)
BUN: 19 mg/dL (ref 6–24)
CO2: 17 mmol/L — ABNORMAL LOW (ref 20–29)
Calcium: 9.6 mg/dL (ref 8.7–10.2)
Chloride: 96 mmol/L (ref 96–106)
Creatinine, Ser: 1.34 mg/dL — ABNORMAL HIGH (ref 0.76–1.27)
Glucose: 115 mg/dL — ABNORMAL HIGH (ref 65–99)
Potassium: 4.3 mmol/L (ref 3.5–5.2)
Sodium: 133 mmol/L — ABNORMAL LOW (ref 134–144)
eGFR: 62 mL/min/{1.73_m2} (ref >59)

## 2020-10-31 LAB — CUP PACEART INCLINIC DEVICE CHECK
Battery Remaining Longevity: 93 mo
Battery Voltage: 3.02 V
Brady Statistic RA Percent Paced: 11 %
Brady Statistic RV Percent Paced: 98 %
Date Time Interrogation Session: 20220610090257
Implantable Lead Implant Date: 20220201
Implantable Lead Implant Date: 20220201
Implantable Lead Location: 753859
Implantable Lead Location: 753860
Implantable Pulse Generator Implant Date: 20220201
Lead Channel Impedance Value: 362.5 Ohm
Lead Channel Impedance Value: 487.5 Ohm
Lead Channel Pacing Threshold Amplitude: 0.5 V
Lead Channel Pacing Threshold Amplitude: 0.5 V
Lead Channel Pacing Threshold Amplitude: 0.625 V
Lead Channel Pacing Threshold Pulse Width: 0.4 ms
Lead Channel Pacing Threshold Pulse Width: 0.4 ms
Lead Channel Pacing Threshold Pulse Width: 0.4 ms
Lead Channel Sensing Intrinsic Amplitude: 11.4 mV
Lead Channel Sensing Intrinsic Amplitude: 4.8 mV
Lead Channel Setting Pacing Amplitude: 0.875
Lead Channel Setting Pacing Amplitude: 2 V
Lead Channel Setting Pacing Pulse Width: 0.4 ms
Lead Channel Setting Sensing Sensitivity: 2 mV
Pulse Gen Model: 2272
Pulse Gen Serial Number: 3895834

## 2020-10-31 LAB — CBC
Hematocrit: 34.1 % — ABNORMAL LOW (ref 37.5–51.0)
Hemoglobin: 11.7 g/dL — ABNORMAL LOW (ref 13.0–17.7)
MCH: 32.6 pg (ref 26.6–33.0)
MCHC: 34.3 g/dL (ref 31.5–35.7)
MCV: 95 fL (ref 79–97)
Platelets: 264 10*3/uL (ref 150–450)
RBC: 3.59 x10E6/uL — ABNORMAL LOW (ref 4.14–5.80)
RDW: 14.1 % (ref 11.6–15.4)
WBC: 8.9 10*3/uL (ref 3.4–10.8)

## 2020-10-31 MED ORDER — METOPROLOL SUCCINATE ER 25 MG PO TB24: 25.0000 mg | ORAL_TABLET | Freq: Every day | ORAL | 3 refills | Status: DC

## 2020-10-31 NOTE — Patient Instructions (Signed)
Medication Instructions:  Your physician has recommended you make the following change in your medication:   START: Metoprolol Succinate 17m daily at bedtime  *If you need a refill on your cardiac medications before your next appointment, please call your pharmacy*   Lab Work: TODAY: BMET, CBC  If you have labs (blood work) drawn today and your tests are completely normal, you will receive your results only by: [place](if you have MyChart) OR A paper copy in the mail If you have any lab test that is abnormal or we need to change your treatment, we will call you to review the results.   Follow-Up: [facility] and your [facility] are our [facility].  As part of our continuing mission to provide you with exceptional heart care, we have created designated Provider Care Teams.  These Care Teams include your primary Cardiologist (physician) and Advanced Practice Providers (APPs -  Physician Assistants and Nurse Practitioners) who all work together to provide you with the care you need, when you need it.  We recommend signing up for the patient portal called "MyChart".  Sign up information is provided on this After Visit Summary.  MyChart is used to connect with patients for Virtual Visits (Telemedicine).  Patients are able to view lab/test results, encounter notes, upcoming appointments, etc.  Non-urgent messages can be sent to your provider as well.   To learn more about what you can do with MyChart, go to [email or web address]    Your next appointment:   3 month(s)  The format for your next appointment:   In Person  Provider:   CLars Mage MD

## 2020-10-31 NOTE — Telephone Encounter (Signed)
Patient seen today in clinic and denies any further symptoms of tachypalpitations.   Low dose beta blocker added at bedtime after visit today.   Pt goes for amyloid study next week. NYHA II-III symptoms at baseline.   Testing does NOT show V-A conduction.   Episodes show brief AT with occasional 1:1 conduction and appropriate mode switching. Longest episode this interrogation < 2 minutes.    VVI 100 : No V-A conduction noted. VVI 110 showed same.   Episode with AT -> tracking and appropriate mode switching.

## 2020-11-05 ENCOUNTER — Encounter (HOSPITAL_COMMUNITY): Payer: Medicaid Other

## 2020-11-10 ENCOUNTER — Other Ambulatory Visit: Payer: Self-pay | Admitting: Internal Medicine

## 2020-11-10 ENCOUNTER — Other Ambulatory Visit: Payer: Self-pay | Admitting: Student

## 2020-11-10 DIAGNOSIS — F322 Major depressive disorder, single episode, severe without psychotic features: Secondary | ICD-10-CM

## 2020-11-13 ENCOUNTER — Other Ambulatory Visit: Payer: Self-pay

## 2020-11-13 ENCOUNTER — Telehealth: Payer: Self-pay | Admitting: *Deleted

## 2020-11-13 ENCOUNTER — Ambulatory Visit (INDEPENDENT_AMBULATORY_CARE_PROVIDER_SITE_OTHER): Payer: Medicaid Other | Admitting: Internal Medicine

## 2020-11-13 ENCOUNTER — Encounter: Payer: Self-pay | Admitting: Internal Medicine

## 2020-11-13 VITALS — BP 117/74 | HR 77 | Temp 97.8°F | Ht 70.0 in | Wt 314.3 lb

## 2020-11-13 DIAGNOSIS — I10 Essential (primary) hypertension: Secondary | ICD-10-CM | POA: Diagnosis not present

## 2020-11-13 DIAGNOSIS — N179 Acute kidney failure, unspecified: Secondary | ICD-10-CM

## 2020-11-13 DIAGNOSIS — R5381 Other malaise: Secondary | ICD-10-CM

## 2020-11-13 DIAGNOSIS — E119 Type 2 diabetes mellitus without complications: Secondary | ICD-10-CM

## 2020-11-13 DIAGNOSIS — D649 Anemia, unspecified: Secondary | ICD-10-CM | POA: Diagnosis not present

## 2020-11-13 DIAGNOSIS — E1110 Type 2 diabetes mellitus with ketoacidosis without coma: Secondary | ICD-10-CM | POA: Diagnosis not present

## 2020-11-13 DIAGNOSIS — Z794 Long term (current) use of insulin: Secondary | ICD-10-CM

## 2020-11-13 DIAGNOSIS — F322 Major depressive disorder, single episode, severe without psychotic features: Secondary | ICD-10-CM

## 2020-11-13 DIAGNOSIS — I5032 Chronic diastolic (congestive) heart failure: Secondary | ICD-10-CM | POA: Diagnosis not present

## 2020-11-13 LAB — GLUCOSE, CAPILLARY: Glucose-Capillary: 151 mg/dL — ABNORMAL HIGH (ref 70–99)

## 2020-11-13 LAB — POCT GLYCOSYLATED HEMOGLOBIN (HGB A1C): Hemoglobin A1C: 5.8 % — AB (ref 4.0–5.6)

## 2020-11-13 MED ORDER — GABAPENTIN 300 MG PO CAPS: 300.0000 mg | ORAL_CAPSULE | Freq: Three times a day (TID) | ORAL | 1 refills | Status: DC

## 2020-11-13 MED ORDER — TRULICITY 1.5 MG/0.5ML ~~LOC~~ SOAJ
SUBCUTANEOUS | 1 refills | Status: DC
Start: 2020-11-13 — End: 2021-01-23

## 2020-11-13 MED ORDER — TRULICITY 0.75 MG/0.5ML ~~LOC~~ SOAJ: 0.7500 mg | SUBCUTANEOUS | 0 refills | Status: DC

## 2020-11-13 MED ORDER — TRULICITY 1.5 MG/0.5ML ~~LOC~~ SOAJ: SUBCUTANEOUS | 0 refills | Status: DC

## 2020-11-13 MED ORDER — METFORMIN HCL 500 MG PO TABS
500.0000 mg | ORAL_TABLET | Freq: Two times a day (BID) | ORAL | 1 refills | Status: DC
Start: 2020-11-13 — End: 2021-09-03

## 2020-11-13 MED ORDER — METFORMIN HCL 500 MG PO TABS: 500.0000 mg | ORAL_TABLET | Freq: Two times a day (BID) | ORAL | 1 refills | Status: DC

## 2020-11-13 MED ORDER — VALSARTAN 40 MG PO TABS: 40.0000 mg | ORAL_TABLET | Freq: Every day | ORAL | 1 refills | Status: DC

## 2020-11-13 MED ORDER — VALSARTAN 80 MG PO TABS: 80.0000 mg | ORAL_TABLET | Freq: Every day | ORAL | 1 refills | Status: DC

## 2020-11-13 MED ORDER — TRULICITY 1.5 MG/0.5ML ~~LOC~~ SOAJ: 1.5000 mg | SUBCUTANEOUS | 0 refills | Status: DC

## 2020-11-13 MED ORDER — FUROSEMIDE 40 MG PO TABS: 40.0000 mg | ORAL_TABLET | Freq: Every day | ORAL | 1 refills | Status: DC

## 2020-11-13 NOTE — Telephone Encounter (Addendum)
Received call from [facility] at [facility] requesting clarification on dulaglutide. States she received 2 Rxs with different strengths and needs to know which one to fill. Please advise.

## 2020-11-13 NOTE — Assessment & Plan Note (Signed)
Trulicity started at today's visit to assist him with weight loss needed for hip replacement.

## 2020-11-13 NOTE — Assessment & Plan Note (Addendum)
Current medications: cymbalta 60m daily PHQ 9 score is 25 at today's visit, however this is consistent with prior encounters. He does seem more down at today's visit however. Unfortunately, he is experiencing some difficulties surrounding his current living condition. Currently lives with his sister and her adult children. He does not feel that he is in any imminent danger in his current residence however is looking for alternatives including homeless shelters however they are currently full. He is also facing financial and food insecurities further complicating the situation.  Denies SI. Discussed the option of increasing the duloxetine however he feels that the medication is working and the primary issue is the living situation.  Plan -continue duloxetine 362mdaily -referred to CCM. Spoke with BiWyatt Portelaho will reach out to him shortly to help look for community resources.

## 2020-11-13 NOTE — Assessment & Plan Note (Addendum)
Blood pressure is at goal in the office today. BP Readings from Last 3 Encounters:  11/13/20 117/74  10/31/20 118/72  10/07/20 118/82   Denies issues with dizziness, lightheadedness or orthostasis.  Plan -continue metoprolol 35m daily, amlodipine 120mdaily. Transition from losartan 5033mo valsartan 21m23mily due to change in coverage -BMP

## 2020-11-13 NOTE — Assessment & Plan Note (Signed)
Current medications: metformin 569m in the morning, 10066mat night  Assessment: Diabetes is well controlled. A1C is 5.7.  Plan -start Trulicity 0.6.81EXeekly x4w, then increase to 1.64m70meekly thereafter. Chosen to also assist with weight loss needed for hip replacement -decrease metformin to 500m59mD -f/u in 3 months

## 2020-11-13 NOTE — Progress Notes (Addendum)
Office Visit   Patient ID: Allen Gould, male    DOB: 01/21/1966, 55 y.o.   MRN: [id number]  Subjective:   He presents for follow up of chronic medical conditions including CHF, HTN, diabetes, and depression. Please refer to assessment and plan for details.      ACTIVE MEDICATIONS   Outpatient Medications Prior to Visit  Medication Sig Dispense Refill   Accu-Chek Softclix Lancets lancets Check blood sugar 3 times a day 100 each 12   amLODipine (NORVASC) 10 MG tablet Take 1 tablet (10 mg total) by mouth daily. 90 tablet 3   apixaban (ELIQUIS) 5 MG TABS tablet Take 1 tablet (5 mg total) by mouth 2 (two) times daily. 180 tablet 3   aspirin 81 MG EC tablet Take 81 mg by mouth daily. Swallow whole.     atorvastatin (LIPITOR) 80 MG tablet Take 1 tablet (80 mg total) by mouth daily. 90 tablet 3   DULoxetine (CYMBALTA) 30 MG capsule TAKE 1 CAPSULE (30 MG TOTAL) BY MOUTH DAILY. 90 capsule 0   glucose blood (ACCU-CHEK GUIDE) test strip Check blood sugar 3 times a day 100 each 12   metoprolol succinate (TOPROL XL) 25 MG 24 hr tablet Take 1 tablet (25 mg total) by mouth at bedtime. 90 tablet 3   pantoprazole (PROTONIX) 40 MG tablet Take 1 tablet (40 mg total) by mouth daily. 90 tablet 3   vitamin B-12 (CYANOCOBALAMIN) 1000 MCG tablet Take 1,000 mcg by mouth daily.     furosemide (LASIX) 40 MG tablet Take 1 tablet (40 mg total) by mouth daily. 30 tablet 11   gabapentin (NEURONTIN) 300 MG capsule Take 1 capsule (300 mg total) by mouth in the morning, at noon, and at bedtime. 270 capsule 1   HUMALOG 100 UNIT/ML injection Inject 4 Units into the skin 3 (three) times daily as needed (blood sugar over 200).     ibuprofen (ADVIL) 200 MG tablet Take 600 mg by mouth every 8 (eight) hours as needed for mild pain.     losartan (COZAAR) 50 MG tablet Take 1 tablet (50 mg total) by mouth daily. 90 tablet 1   metFORMIN (GLUCOPHAGE) 500 MG tablet TAKE 1 AND 1/2 TABLETS BY MOUTH TWICE DAILY WITH A MEAL. 90  tablet 1   metFORMIN (GLUCOPHAGE-XR) 750 MG 24 hr tablet Take 750 mg by mouth in the morning and at bedtime.     No facility-administered medications prior to visit.     Objective:   BP 117/74 (BP Location: Right Arm, Patient Position: Sitting, Cuff Size: Normal)   Pulse 77   Temp 97.8 F (36.6 C) (Oral)   Ht 5' 10"  (1.778 m)   Wt (!) 314 lb 4.8 oz (142.6 kg)   SpO2 96%   BMI 45.10 kg/m  Wt Readings from Last 3 Encounters:  11/13/20 (!) 314 lb 4.8 oz (142.6 kg)  10/31/20 (!) 314 lb 4 oz (142.5 kg)  10/07/20 (!) 307 lb (139.3 kg)   BP Readings from Last 3 Encounters:  11/13/20 117/74  10/31/20 118/72  10/07/20 118/82   Cardiac: RRR Pulm: lungs clear Psych: normal affect overall but became tearful when discussing his living situation. Denies SI.  Health Maintenance:   Health Maintenance  Topic Date Due   COVID-19 Vaccine (1) 11/29/2020 (Originally 02/24/1971)   Zoster Vaccines- Shingrix (1 of 2) 02/13/2021 (Originally 02/25/1983)   Pneumococcal Vaccine 4-23 Years old (1 - PCV) 11/13/2021 (Originally 02/24/1970)   COLONOSCOPY (Pts 45-66yr Insurance coverage will  need to be confirmed)  11/13/2021 (Originally 02/24/2009)   TETANUS/TDAP  11/13/2021 (Originally 02/25/1983)   INFLUENZA VACCINE  12/22/2020   HEMOGLOBIN A1C  02/13/2021   LIPID PANEL  04/08/2021   FOOT EXAM  08/04/2021   OPHTHALMOLOGY EXAM  09/03/2021   PNEUMOCOCCAL POLYSACCHARIDE VACCINE AGE 2-64 HIGH RISK  Completed   Hepatitis C Screening  Completed   HIV Screening  Completed   HPV VACCINES  Aged Out     Assessment & Plan:   Problem List Items Addressed This Visit       Cardiovascular and Mediastinum   Hypertension (Chronic)    Blood pressure is at goal in the office today. BP Readings from Last 3 Encounters:  11/13/20 117/74  10/31/20 118/72  10/07/20 118/82   Denies issues with dizziness, lightheadedness or orthostasis.  Plan -continue metoprolol 99m daily, amlodipine 172mdaily. Transition  from losartan 5021mo valsartan 25m46mily due to change in coverage -BMP       Relevant Medications   furosemide (LASIX) 40 MG tablet   valsartan (DIOVAN) 80 MG tablet   Other Relevant Orders   Basic metabolic panel (Completed)   (HFpEF) heart failure with preserved ejection fraction (HCC) (Chronic)    Current medications: losartan 50mg72mly, metoprolol 25mg 24m, lasix 40mg d33m Reports adherence with the exception of lasix lately due to working out in the sun and not wanting to get dehydrated. Wt Readings from Last 3 Encounters:  11/13/20 (!) 314 lb 4.8 oz (142.6 kg)  10/31/20 (!) 314 lb 4 oz (142.5 kg)  10/07/20 (!) 307 lb (139.3 kg)   Appears euvolemic on exam today. Denies CHF symptoms Plan -continue current management.  -no changes at today's visit -BMP       Relevant Medications   furosemide (LASIX) 40 MG tablet   valsartan (DIOVAN) 80 MG tablet     Endocrine   DM (diabetes mellitus), type 2 (HCC) - [place]    Current medications: metformin 500mg in76m morning, 1000mg at 36mt  Assessment: Diabetes is well controlled. A1C is 5.7.  Plan -start Trulicity 0.75mg we1.09NA4w, then increase to 1.5mg weekl26mhereafter. Chosen to also assist with weight loss needed for hip replacement -decrease metformin to 500mg BID -75min 3 months        Relevant Medications   metFORMIN (GLUCOPHAGE) 500 MG tablet   Dulaglutide (TRULICITY) 1.5 MG/0.5ML SOTF/5.7DUapentin (NEURONTIN) 300 MG capsule   valsartan (DIOVAN) 80 MG tablet   Other Relevant Orders   POC Hbg A1C (Completed)   Glucose, capillary (Completed)   AMB Referral to Community Care Coordinaton     Genitourinary   AKI (acute kidney injury) (HCC)    RenMoose Wilson Roadfunction seems to have been declining over the past few months. Labs from today's visit show a more dramatic decline, with GFR being estimated around 35, from 55 in April66Creatinine has also jumped from 1.3 to 2.1. BUN/Cr fits a pre-renal picture. He has not  been taking lasix for the past 2 weeks as he has been working out in the heat. He has been hydrating appropriately--around eight 16oz bottles of water per day. He denies CHF symptoms On chart review, it looks like cardiology had noted a decline in renal function back in May. SPEP/UPEP were normal. PYP scan had been ordered as well due to LVH.  It does not look like there have been any medication changes over this time period that should have contributed.  His diabetes and hypertension are  controlled.  Plan: I called and discussed results with Gertie Exon.  -Will have him increase fluid intake a little more over the upcoming days and return on 6/28 for repeat BMP, UA, urine protein creatinine, and urine sodium. -Will have him hold off on starting trulicity until renal function stabilizes.  -Hold lasix and losartan -If renal function does not improve, may need referral to nephrology for ongoing workup.       Relevant Orders   Basic metabolic panel   Urinalysis, Complete w Microscopic   Sodium, Urine   Microalbumin / Creatinine Urine Ratio   Creatinine, Urine     Other   Morbid obesity ([place]) (Chronic)    Trulicity started at today's visit to assist him with weight loss needed for hip replacement.       Relevant Medications   metFORMIN (GLUCOPHAGE) 500 MG tablet   Dulaglutide (TRULICITY) 1.5 YF/7.4BS SOPN   Severe depression (HCC) (Chronic)    Current medications: cymbalta 92m daily PHQ 9 score is 25 at today's visit, however this is consistent with prior encounters. He does seem more down at today's visit however. Unfortunately, he is experiencing some difficulties surrounding his current living condition. Currently lives with his sister and her adult children. He does not feel that he is in any imminent danger in his current residence however is looking for alternatives including homeless shelters however they are currently full. He is also facing financial and food insecurities further  complicating the situation.  Denies SI. Discussed the option of increasing the duloxetine however he feels that the medication is working and the primary issue is the living situation.  Plan -continue duloxetine 366mdaily -referred to CCM. Spoke with BiWyatt Portelaho will reach out to him shortly to help look for community resources.       Physical deconditioning    His functional status has dramatically improved. He did not require a wheelchair to mobilize around the clinic, which I have not seen him do since we met last year! He is encouraged to continue to work on mobilizing to improve strength.       Normocytic anemia    Will recheck a CBC and iron panel with labs on 6/28       Relevant Orders   Iron, TIBC and Ferritin Panel   CBC with Diff   Follow up in 3 months for follow up of his chronic medical conditions  Pt discussed with Dr. NaAdolm JosephMD Internal Medicine Resident PGY-2 [facility] Pager: #[phone]/24/2022 12:24 PM

## 2020-11-13 NOTE — Telephone Encounter (Signed)
The 0.41m/mL one

## 2020-11-13 NOTE — Telephone Encounter (Signed)
The 1.21m prescription is for him to pick up after he is done with the 0.739mprescription

## 2020-11-13 NOTE — Assessment & Plan Note (Signed)
His functional status has dramatically improved. He did not require a wheelchair to mobilize around the clinic, which I have not seen him do since we met last year! He is encouraged to continue to work on mobilizing to improve strength.

## 2020-11-13 NOTE — Assessment & Plan Note (Signed)
Current medications: losartan 77m daily, metoprolol 223mdaiy, lasix 4093maily Reports adherence with the exception of lasix lately due to working out in the sun and not wanting to get dehydrated. Wt Readings from Last 3 Encounters:  11/13/20 (!) 314 lb 4.8 oz (142.6 kg)  10/31/20 (!) 314 lb 4 oz (142.5 kg)  10/07/20 (!) 307 lb (139.3 kg)   Appears euvolemic on exam today. Denies CHF symptoms Plan -continue current management.  -no changes at today's visit -BMP

## 2020-11-13 NOTE — Telephone Encounter (Signed)
Christy Sartorius at [employer]. Please remove 1.5 mg from med list. Thank you.

## 2020-11-14 ENCOUNTER — Telehealth: Payer: Medicaid Other | Admitting: Licensed Clinical Social Worker

## 2020-11-14 DIAGNOSIS — D649 Anemia, unspecified: Secondary | ICD-10-CM | POA: Insufficient documentation

## 2020-11-14 LAB — BASIC METABOLIC PANEL
BUN/Creatinine Ratio: 15 (ref 9–20)
BUN: 32 mg/dL — ABNORMAL HIGH (ref 6–24)
CO2: 19 mmol/L — ABNORMAL LOW (ref 20–29)
Calcium: 9.7 mg/dL (ref 8.7–10.2)
Chloride: 100 mmol/L (ref 96–106)
Creatinine, Ser: 2.14 mg/dL — ABNORMAL HIGH (ref 0.76–1.27)
Glucose: 121 mg/dL — ABNORMAL HIGH (ref 65–99)
Potassium: 4.8 mmol/L (ref 3.5–5.2)
Sodium: 136 mmol/L (ref 134–144)
eGFR: 35 mL/min/{1.73_m2} — ABNORMAL LOW (ref >59)

## 2020-11-14 NOTE — Addendum Note (Signed)
Addended by: [name] on: 11/14/2020 12:24 PM   Modules accepted: Orders

## 2020-11-14 NOTE — Addendum Note (Signed)
Addended by: [name] on: 11/14/2020 12:08 PM   Modules accepted: Orders

## 2020-11-14 NOTE — Assessment & Plan Note (Addendum)
Renal function seems to have been declining over the past few months. Labs from today's visit show a more dramatic decline, with GFR being estimated around 35, from 39 in April. Creatinine has also jumped from 1.3 to 2.1. BUN/Cr fits a pre-renal picture. He has not been taking lasix for the past 2 weeks as he has been working out in the heat. He has been hydrating appropriately--around eight 16oz bottles of water per day. He denies CHF symptoms On chart review, it looks like cardiology had noted a decline in renal function back in May. SPEP/UPEP were normal. PYP scan had been ordered as well due to LVH.  It does not look like there have been any medication changes over this time period that should have contributed.  His diabetes and hypertension are controlled.  Plan: I called and discussed results with Gertie Exon.  -Will have him increase fluid intake a little more over the upcoming days and return on 6/28 for repeat BMP, UA, urine protein creatinine, and urine sodium. -Will have him hold off on starting trulicity until renal function stabilizes.  -Hold lasix and losartan -If renal function does not improve, may need referral to nephrology for ongoing workup.

## 2020-11-14 NOTE — Assessment & Plan Note (Signed)
Will recheck a CBC and iron panel with labs on 6/28

## 2020-11-18 ENCOUNTER — Other Ambulatory Visit: Payer: Self-pay

## 2020-11-18 ENCOUNTER — Other Ambulatory Visit (INDEPENDENT_AMBULATORY_CARE_PROVIDER_SITE_OTHER): Payer: Medicaid Other

## 2020-11-18 DIAGNOSIS — N179 Acute kidney failure, unspecified: Secondary | ICD-10-CM

## 2020-11-18 DIAGNOSIS — D649 Anemia, unspecified: Secondary | ICD-10-CM | POA: Diagnosis present

## 2020-11-18 LAB — URINALYSIS, COMPLETE (UACMP) WITH MICROSCOPIC
Bilirubin Urine: NEGATIVE
Glucose, UA: NEGATIVE mg/dL
Hgb urine dipstick: NEGATIVE
Ketones, ur: NEGATIVE mg/dL
Nitrite: NEGATIVE
Protein, ur: NEGATIVE mg/dL
Specific Gravity, Urine: 1.015 (ref 1.005–1.030)
pH: 5 (ref 5.0–8.0)

## 2020-11-18 LAB — SODIUM, URINE, RANDOM: Sodium, Ur: 135 mmol/L

## 2020-11-18 NOTE — Addendum Note (Signed)
Addended by: Truddie Crumble on: 11/18/2020 08:41 AM   Modules accepted: Orders

## 2020-11-19 ENCOUNTER — Ambulatory Visit: Payer: Medicaid Other | Admitting: Licensed Clinical Social Worker

## 2020-11-19 ENCOUNTER — Telehealth: Payer: Self-pay | Admitting: Licensed Clinical Social Worker

## 2020-11-19 LAB — CBC WITH DIFFERENTIAL/PLATELET
Basophils Absolute: 0 10*3/uL (ref 0.0–0.2)
Basos: 0 %
EOS (ABSOLUTE): 0.2 10*3/uL (ref 0.0–0.4)
Eos: 3 %
Hematocrit: 36.1 % — ABNORMAL LOW (ref 37.5–51.0)
Hemoglobin: 12.3 g/dL — ABNORMAL LOW (ref 13.0–17.7)
Immature Grans (Abs): 0 10*3/uL (ref 0.0–0.1)
Immature Granulocytes: 0 %
Lymphocytes Absolute: 1 10*3/uL (ref 0.7–3.1)
Lymphs: 14 %
MCH: 32.4 pg (ref 26.6–33.0)
MCHC: 34.1 g/dL (ref 31.5–35.7)
MCV: 95 fL (ref 79–97)
Monocytes Absolute: 0.8 10*3/uL (ref 0.1–0.9)
Monocytes: 11 %
Neutrophils Absolute: 4.9 10*3/uL (ref 1.4–7.0)
Neutrophils: 72 %
Platelets: 229 10*3/uL (ref 150–450)
RBC: 3.8 x10E6/uL — ABNORMAL LOW (ref 4.14–5.80)
RDW: 13.5 % (ref 11.6–15.4)
WBC: 6.8 10*3/uL (ref 3.4–10.8)

## 2020-11-19 LAB — BASIC METABOLIC PANEL
BUN/Creatinine Ratio: 19 (ref 9–20)
BUN: 23 mg/dL (ref 6–24)
CO2: 17 mmol/L — ABNORMAL LOW (ref 20–29)
Calcium: 9.6 mg/dL (ref 8.7–10.2)
Chloride: 102 mmol/L (ref 96–106)
Creatinine, Ser: 1.2 mg/dL (ref 0.76–1.27)
Glucose: 118 mg/dL — ABNORMAL HIGH (ref 65–99)
Potassium: 4.8 mmol/L (ref 3.5–5.2)
Sodium: 137 mmol/L (ref 134–144)
eGFR: 71 mL/min/{1.73_m2} (ref >59)

## 2020-11-19 LAB — IRON,TIBC AND FERRITIN PANEL
Ferritin: 198 ng/mL (ref 30–400)
Iron Saturation: 28 % (ref 15–55)
Iron: 80 ug/dL (ref 38–169)
Total Iron Binding Capacity: 282 ug/dL (ref 250–450)
UIBC: 202 ug/dL (ref 111–343)

## 2020-11-19 LAB — MICROALBUMIN / CREATININE URINE RATIO
Creatinine, Urine: 122.7 mg/dL
Microalb Creat Ratio: 84 mg/g creat — ABNORMAL HIGH (ref 0–29)
Microalb, Ur: 103.3 ug/mL — ABNORMAL HIGH

## 2020-11-19 NOTE — Chronic Care Management (AMB) (Signed)
Opened in error

## 2020-11-19 NOTE — Patient Instructions (Signed)
Visit Information  Instructions: patient will work with SW to address concerns related to Housing and Limited food.  Patient was given the following information about care management and care coordination services today, agreed to services, and gave verbal consent: 1.care management/care coordination services include personalized support from designated clinical staff supervised by their physician, including individualized plan of care and coordination with other care providers 2. 24/7 contact phone numbers for assistance for urgent and routine care needs. 3. The patient may stop care management/care coordination services at any time by phone call to the office staff.  Patient verbalizes understanding of instructions provided today and agrees to view in [place].   The care management team will reach out to the patient again over the next 30 days.   [name], [place]  Social Worker [facility] Care Management  [phone]

## 2020-11-19 NOTE — Telephone Encounter (Signed)
  Care Management   Follow Up Note   11/19/2020 Name: Allen Gould MRN: [id number] DOB: [birth year]   Referred by: [name], MD Reason for referral : No chief complaint on file.   Patient advised he is unable to talk with SW at the moment. Patient requested a call back at 3pm on 11/19/2020.   Follow Up Plan: SW will attempt patient at [place], [place] Worker [facility] Care Management  [phone]

## 2020-11-19 NOTE — Chronic Care Management (AMB) (Signed)
  Care Management   Social Work Visit Note  11/19/2020 Name: Allen Gould MRN: [id number] DOB: Oct 03, 1965  Allen Gould is a 55 y.o. year old male who sees Allen Hansen, MD for primary care. The care management team was consulted for assistance with care management and care coordination needs related to [facility] Resources    Patient was given the following information about care management and care coordination services today, agreed to services, and gave verbal consent: 1.care management/care coordination services include personalized support from designated clinical staff supervised by their physician, including individualized plan of care and coordination with other care providers 2. 24/7 contact phone numbers for assistance for urgent and routine care needs. 3. The patient may stop care management/care coordination services at any time by phone call to the office staff.  Engaged with patient by telephone for initial visit in response to provider referral for social work chronic care management and care coordination services.  Assessment: Review of patient history, allergies, and health status during evaluation of patient need for care management/care coordination services.    Interventions:  Patient interviewed and appropriate assessments performed. Collaborated with clinical team regarding patient needs  Patient advised he is currently residing with another individual and he is wanting to relocate. Patient declines having income or additional friends and family. Patient unable to disclose reasoning for wanting to relocate at the time of call. Patient will contact SW if he would like to discuss.  Patient advise he has contacted shelters, but they are full.  Patient advised he has a limited amount of food, once his EBT benefits are exhausted.   Patient stated he has transportation for all his medical appointments, but limited transportation for other needs.  Patient stated he has  applied for disability and have been waiting on a decision for 3 years.   SDOH (Social Determinants of Health) assessments performed: Yes     Plan:  patient will work with BSW to address needs related to Housing barriers and limited food. SW emailed food pantries and housing resources to [name].  SW will follow up with patient in the next 30-days.  [place], [place]  Social Worker [facility] Care Management  [phone]

## 2020-11-20 ENCOUNTER — Other Ambulatory Visit: Payer: Self-pay | Admitting: Internal Medicine

## 2020-11-20 DIAGNOSIS — N179 Acute kidney failure, unspecified: Secondary | ICD-10-CM

## 2020-11-20 NOTE — Assessment & Plan Note (Signed)
BMP shows resolution of AKI. Creatinine now 1.2. FeNa is 1.  I called and discussed lab results with him. We had a misunderstanding and he has been taking the valsartan this whole time but has been holding the lasix as we had discussed. He does not feel like he is getting volume overloaded however I think we do need to restart his lasix to avoid heading in that direction.  -instructed to resume lasix -he will follow up in the office to see one of my colleagues towards the end of next week to re-check blood pressure and volume status. Please recheck a BMP at that time. If it's ok, he may start taking the trulicity.

## 2020-11-25 ENCOUNTER — Encounter: Payer: Self-pay | Admitting: *Deleted

## 2020-11-28 ENCOUNTER — Ambulatory Visit (INDEPENDENT_AMBULATORY_CARE_PROVIDER_SITE_OTHER): Payer: Medicaid Other | Admitting: Internal Medicine

## 2020-11-28 VITALS — BP 122/89 | HR 83 | Wt 315.3 lb

## 2020-11-28 DIAGNOSIS — I10 Essential (primary) hypertension: Secondary | ICD-10-CM

## 2020-11-28 DIAGNOSIS — N179 Acute kidney failure, unspecified: Secondary | ICD-10-CM

## 2020-11-28 DIAGNOSIS — F322 Major depressive disorder, single episode, severe without psychotic features: Secondary | ICD-10-CM

## 2020-11-28 MED ORDER — BUPROPION HCL 75 MG PO TABS: 75.0000 mg | ORAL_TABLET | Freq: Two times a day (BID) | ORAL | 3 refills | Status: DC

## 2020-11-28 MED ORDER — DULOXETINE HCL 60 MG PO CPEP: 60.0000 mg | ORAL_CAPSULE | Freq: Every day | ORAL | 2 refills | Status: DC

## 2020-11-28 NOTE — Progress Notes (Deleted)
06/22 duloxetine increased to 10m Has been getting cortisone injections for pain, last in march-April. Due for another one. Orthopod does these. Ibuprofen not helping.gabapentin 300 tid not helpful. Pain is fueling depression alongside outside factors.  Cymbalta not really helpful at all but was told it would help depression and legs +suidicidal thoughts but no plan

## 2020-11-28 NOTE — Assessment & Plan Note (Signed)
BMP drawn during today's visit. He denies feeling like he is becoming volume overloaded and has not started taking Lasix again.  - BP 122/89 at visit today - Appears clinically to be euvolemic - Decision to restart trulicity pending BMP results

## 2020-11-28 NOTE — Patient Instructions (Addendum)
Mr. Blau,  It was a pleasure to meet you today. We discussed many things and I have detailed our plan below: Resolution of acute kidney injury: We have drawn blood to check your kidney function. Once those results come in, I will let you know what they show. If they come back good, we will have you restart your Trulicity. Depression: We have discussed and decided together that it would be a benefit to you to increase your Cymbalta dose to 60 mg as well as add an additional medication, bupropion 75 mg, to your medication regimen. Cymbalta can also help with the neuropathic pain you experience in your legs, and bupropion can help with weight loss. I am placing a referral to Dr. Theodis Shove who is a Engineer, materials for talk therapy. Having someone to talk to can make a huge difference in how you are feeling in regards to your mental health. I will reach out to our social worker, Ms. [name], to try and provide you with more support in your journey to obtain assistance such as disability, housing, food, etc.  I am also including below varying resources that you can use in the event of a crisis, or if you just feel like you need someone to help and listen in the meantime.  National Suicide Hotline: 005-110-2111 NB 567  [place]: [facility] for MH/DD/SA: [phone] Mobile Crisis Team (available 24 hours a day): [facility]: Address: [address] [place], [place] [address]; phone number: [phone]  I genuinely appreciate your honesty and openness in our visit today. I am so hopeful that we can help you begin to feel better soon.  Please do not hesitate to reach out to our clinic with any questions or concerns. We are always here to help! Dr. Farrel Gordon

## 2020-11-28 NOTE — Assessment & Plan Note (Signed)
BP today 122/89. Patient denies restarting Lasix.

## 2020-11-28 NOTE — Progress Notes (Signed)
Patient ID: Allen Gould, male   DOB: 1966/02/28, 55 y.o.   MRN: [id number]   CC: follow-up labs to check kidney function, severe depression  HPI:  AllenAgustus A Gould is a 55 y.o. male who presents today for follow-up labs to monitor recovery from a recent AKI. He states that he currently feels okay and hasn't noticed any symptoms suggestive of volume overload. He denies orthopnea, shortness of breath, swelling, GI upset, or significant weight gain (does endorse about 1 pound increase). He does voice concerns over occasionally feeling feverish, where he gets sweaty and weak; he admits that he has not been eating much recently. He maintains that he has not been taking Lasix. He makes sure that he stays hydrated. He does state that he experiences feelings of air hunger once a week or less, and states that these feelings mainly occur during exertion.  Allen Gould also voices concerns over feeling very depressed, hopeless, and "stuck." His PHQ-9 score today is 26. He has been taking Cymbalta but does not feel that it is helping. His dose was increased 11/12/2020 to 30 mg daily. He feels that there are many factors contributing to his depression, including severe pain in both hips that impairs his mobility. He states that he has been getting steroid injections and that those help immediately. His last injections were in March or April and he can tell that their effects have worn off. He does say that he tries to take ibuprofen though this does not help. He also feels that the gabapentin that he is prescribed does not aid in pain management.   He also describes struggles with obtaining disability, food, and stable housing. He was living with his sister but is now living with his niece. The home environment is stressful to him. He says that he feels "stuck," and like he is at his breaking point.   Allen Gould states that what fuels his deep depressive episodes in addition to many life factors is frustration  regarding how others treat him. He says that he tries to live his life in a way that makes it difficult for others to disrespect or dislike him, and when they do this, it deeply upsets him. He states that he does not have a support system in his friends or family because he does not feel like he can trust anyone. When he has these episodes, he states that he spends time alone to get through them.   He admits to daily thoughts of suicide and denies having a plan. When asked if there are weapons in the home or if he has access to weapons, he states that there are not. He does state quietly that he would not need a weapon, and that in general if he decides to do something, he will do it. He also states that he is not worried about himself, but what he would do if someone did push him past his breaking point. He does not have plans to harm others. When asked what one thing is most important to him and makes living worth it, he says "She's dead and gone. My fiance, she died of COVID 3 days before we were supposed to get married." He does admit to a previous suicide attempt in the 1980's, stating that he tried to overdose on tylenol but took multiple water pills instead on accident.    Past Medical History:  Diagnosis Date   Acquired dilation of ascending aorta and aortic root ([place])    42m  by echo 09/2020 but normal on Chest CTA 05/2020   Acute kidney injury ([place]) 09/01/2019   Aortic stenosis    mild with mean AVG 60mHg by echo 09/2019   Asthma    Balanitis 08/09/2014   Bifascicular block    Cough 08/09/2014   Diabetes mellitus without complication ([place]    Dilated aortic root ([place]    358mby echo 09/2019   DM (diabetes mellitus) type 2, uncontrolled, without ketoacidosis 08/10/2013   Dyslipidemia 03/01/2014   Financial difficulties 08/09/2014   GERD (gastroesophageal reflux disease)    Health care maintenance 03/01/2014   Hypertension    Hypertensive urgency 05/29/2017   Hypomagnesemia 05/29/2017    Insomnia 08/29/2013   Morbid obesity ([place]/12/2013   Near syncope 05/29/2017   Odontogenic infection of jaw 05/29/2017   PAC (premature atrial contraction)    PAF (paroxysmal atrial fibrillation) (HCC)    Pressure injury of skin 09/02/2019   PVCs (premature ventricular contractions)    Right shoulder pain 08/29/2013   Second degree AV block, Mobitz type I    Sepsis ([facility]/10/2017   Sleep apnea    uses cpap   Review of Systems:  Review of Systems  Constitutional:  Positive for chills and fever. Negative for weight loss.  Respiratory:  Positive for shortness of breath. Negative for cough.   Cardiovascular:  Negative for chest pain, orthopnea, claudication and leg swelling.  Musculoskeletal:  Positive for joint pain and myalgias.  Neurological:  Positive for tingling. Negative for weakness.  Psychiatric/Behavioral:  Positive for depression. Negative for suicidal ideas.     Physical Exam:  Vitals:   11/28/20 0931  BP: 122/89  Pulse: 83  SpO2: 100%  Weight: (!) 315 lb 4.8 oz (143 kg)   Physical Exam Constitutional:      Appearance: Normal appearance. He is morbidly obese.  Cardiovascular:     Rate and Rhythm: Normal rate and regular rhythm.     Pulses:          Radial pulses are 2+ on the right side and 2+ on the left side.       Posterior tibial pulses are 2+ on the right side and 2+ on the left side.     Heart sounds: Normal heart sounds.  Pulmonary:     Effort: Pulmonary effort is normal.     Breath sounds: Normal breath sounds and air entry.  Abdominal:     Palpations: Abdomen is rigid.     Tenderness: There is no abdominal tenderness.  Musculoskeletal:     Right lower leg: No edema.     Left lower leg: No edema.  Skin:    General: Skin is warm and dry.     Nails: There is no clubbing.  Psychiatric:        Mood and Affect: Mood is depressed. Affect is flat.        Speech: Speech normal.        Behavior: Behavior normal. Behavior is not withdrawn. Behavior is  cooperative.        Thought Content: Thought content includes suicidal ideation.        Cognition and Memory: Cognition and memory normal.     Assessment & Plan:   See Encounters Tab for problem based charting.  Patient seen with Dr. WiJimmye Norman

## 2020-11-28 NOTE — Assessment & Plan Note (Signed)
PHQ9 score of 26 at today's visit. He voices many concerns that contribute to worsening of depressed feelings, very extensively explained in HPI for today's visit. Continues to deny SI and is not actively interested in talk therapy.  - Increase Cymbalta to 60 mg daily - Begin Wellbutrin 75 mg twice daily - Referral to Dr. Theodis Shove placed. He is not presently interested in this resource but was open to having the referral placed so that he has the option should he change his mind. - Provided with contact information for local crisis center, mobile crisis centers, [facility], and national suicide hotline number.  - Encouraged him to reach out immediately if he feels like he needs help.

## 2020-11-29 LAB — BMP8+ANION GAP
Anion Gap: 17 mmol/L (ref 10.0–18.0)
BUN/Creatinine Ratio: 21 — ABNORMAL HIGH (ref 9–20)
BUN: 33 mg/dL — ABNORMAL HIGH (ref 6–24)
CO2: 19 mmol/L — ABNORMAL LOW (ref 20–29)
Calcium: 9.9 mg/dL (ref 8.7–10.2)
Chloride: 98 mmol/L (ref 96–106)
Creatinine, Ser: 1.59 mg/dL — ABNORMAL HIGH (ref 0.76–1.27)
Glucose: 96 mg/dL (ref 65–99)
Potassium: 4.7 mmol/L (ref 3.5–5.2)
Sodium: 134 mmol/L (ref 134–144)
eGFR: 51 mL/min/{1.73_m2} — ABNORMAL LOW (ref >59)

## 2020-12-01 ENCOUNTER — Telehealth: Payer: Self-pay | Admitting: Behavioral Health

## 2020-12-01 NOTE — Telephone Encounter (Signed)
Contacted Pt directly & spoke for 15 min Intro call. Pt shared recent events this year that have contributed to his deep grief for self, death of Fiance & his health status changes. We will discuss this on Tue, July 12th @ 3:00pm.  Dr. Theodis Shove

## 2020-12-02 ENCOUNTER — Ambulatory Visit: Payer: Medicaid Other | Admitting: Behavioral Health

## 2020-12-02 ENCOUNTER — Telehealth: Payer: Self-pay | Admitting: Cardiology

## 2020-12-02 ENCOUNTER — Telehealth: Payer: Self-pay | Admitting: Internal Medicine

## 2020-12-02 DIAGNOSIS — F419 Anxiety disorder, unspecified: Secondary | ICD-10-CM

## 2020-12-02 DIAGNOSIS — F322 Major depressive disorder, single episode, severe without psychotic features: Secondary | ICD-10-CM

## 2020-12-02 NOTE — Telephone Encounter (Signed)
Spoke with the patient who states that he received a call from our office but did not see that a message was left. It looks like nuc med called him in regards to his amyloid study on 7/20. They have changed his location for test to the [facility] office. Patient is aware and states that he will be there.

## 2020-12-02 NOTE — Telephone Encounter (Signed)
Pt returning a phone call from earlier today. Please advise pt further

## 2020-12-02 NOTE — BH Specialist Note (Signed)
Integrated Behavioral Health via Telemedicine Visit  12/02/2020 MILBERN DOESCHER [id number]  Number of [place] visits: 1/6 Session Start time: 1:15pm  Session End time: 1:50pm Total time: 35   Referring Provider: Dr. [name], MD Patient/Family location: Pt is outside in a [place] that has [facility] Provider location: [facility] Office All persons participating in visit: [facility] Office Types of Service: Individual psychotherapy  I connected with [name] and/or Alisa Graff Dede's  self  via  Animal nutritionist  (Video is Tree surgeon) and verified that I am speaking with the correct person using two identifiers. Discussed confidentiality: Yes   I discussed the limitations of telemedicine and the availability of in person appointments.  Discussed there is a possibility of technology failure and discussed alternative modes of communication if that failure occurs.  I discussed that engaging in this telemedicine visit, they consent to the provision of behavioral healthcare and the services will be billed under their insurance.  Patient and/or legal guardian expressed understanding and consented to Telemedicine visit: Yes   Presenting Concerns: Patient and/or family reports the following symptoms/concerns: elevated anx/dep due to living conditions; yelling, screaming, & fighting Duration of problem: > than a year; Severity of problem: moderate  Patient and/or Family's Strengths/Protective Factors: Social connections, Physical Health (exercise, healthy diet, medication compliance, etc.), and is trying to adjust to new living circumstances  Goals Addressed: Patient will:  Reduce symptoms of: anxiety, depression, and stress   Increase knowledge and/or ability of: coping skills, healthy habits, and stress reduction   Demonstrate ability to: Increase healthy adjustment to current life circumstances  Progress towards  Goals: Ongoing  Interventions: Interventions utilized:  Behavioral Activation and Supportive Counseling Standardized Assessments completed:  screeners prn  Patient and/or Family Response: Pt receptive to early call today & requests future appts  Assessment: Patient currently experiencing concerns over Disability, loss of his Fiance, & adjustment to his current living situation. Pt is pre-disposed to psychological overwhelm due to hearing the health status changes being reported to him & the changes happening in other domains of life.  Patient may benefit from cont'd ck-in sessions for mental health wellness.  Plan: Follow up with behavioral health clinician on : 2-3 wks on telehealth for 60 min Behavioral recommendations: Cont w/your present philosophy of one day at a time & taking it day by day Referral(s): [place] (In Clinic)  I discussed the assessment and treatment plan with the patient and/or parent/guardian. They were provided an opportunity to ask questions and all were answered. They agreed with the plan and demonstrated an understanding of the instructions.   They were advised to call back or seek an in-person evaluation if the symptoms worsen or if the condition fails to improve as anticipated.  Donnetta Hutching, LMFT

## 2020-12-03 ENCOUNTER — Other Ambulatory Visit: Payer: Self-pay | Admitting: *Deleted

## 2020-12-03 ENCOUNTER — Other Ambulatory Visit: Payer: Self-pay

## 2020-12-03 NOTE — Patient Outreach (Addendum)
Medicaid Managed Care   Nurse Care Manager Note  12/03/2020 Name:  Allen Gould MRN:  [id number] DOB:  May 28, 1965  Allen Gould is an 55 y.o. year old male who is a primary patient of Allen Hansen, MD.  [facility] Managed Care Coordination team was consulted for assistance with:    CHF HTN DMII  [name] was given information about Medicaid Managed Care Coordination team services today. Allen Gould agreed to services and verbal consent obtained.  Engaged with patient by telephone for initial visit in response to provider referral for case management and/or care coordination services.   Assessments/Interventions:  Review of past medical history, allergies, medications, health status, including review of consultants reports, laboratory and other test data, was performed as part of comprehensive evaluation and provision of chronic care management services.  SDOH (Social Determinants of Health) assessments and interventions performed: SDOH Interventions    Flowsheet Row Most Recent Value  SDOH Interventions   Food Insecurity Interventions Other (Comment)  [referral for Care Guide placed]  Financial Strain Interventions Other (Comment)  [Care Guide referral]  Housing Interventions Other (Comment)  [Care Guide referral placed]  Transportation Interventions Other (Comment)  [Collaborate with CCM, SW]       Care Plan  Allergies  Allergen Reactions   Shellfish Allergy Hives    Medications Reviewed Today     Reviewed by Allen Montane, RN (Registered Nurse) on 12/03/20 at [phone]  Med List Status: <None>   Medication Order Taking? Sig Documenting Provider Last Dose Status Informant  Accu-Chek Softclix Lancets lancets [id number] Yes Check blood sugar 3 times a day Allen Beard, MD Taking Active Self  amLODipine (NORVASC) 10 MG tablet [id number] Yes Take 1 tablet (10 mg total) by mouth daily. Madalyn Rob, MD Taking Active Self  apixaban (ELIQUIS) 5 MG TABS tablet  [id number] Yes Take 1 tablet (5 mg total) by mouth 2 (two) times daily. Vickie Epley, MD Taking Active Self  aspirin 81 MG EC tablet [id number] Yes Take 81 mg by mouth daily. Swallow whole. [provider] Taking Active Self  atorvastatin (LIPITOR) 80 MG tablet [id number] Yes Take 1 tablet (80 mg total) by mouth daily. Allen Hansen, MD Taking Active Self  buPROPion [facility]) 75 MG tablet [id number] No Take 1 tablet (75 mg total) by mouth 2 (two) times daily.  Patient not taking: Reported on 12/03/2020   Allen Gordon, DO Not Taking Active            Med Note (Dorine Duffey A   Wed Dec 03, 2020  9:27 AM) Needs to pick up prescription  Dulaglutide (TRULICITY) 1.5 YY/5.0PT SOPN [id number] Yes Inject 0.25m one time per week for the first four weeks, followed by 1.565mthereafter Allen Gould Taking Active   DULoxetine (CYMBALTA) 60 MG capsule [id number] Take 1 capsule (60 mg total) by mouth daily. DeFarrel GordonDO Taking Active   furosemide (LASIX) 40 MG tablet [id number] Take 1 tablet (40 mg total) by mouth daily.  Patient not taking: Reported on 12/03/2020   Allen Gould Not Taking Active   gabapentin (NEURONTIN) 300 MG capsule [id number] Take 1 capsule (300 mg total) by mouth in the morning, at noon, and at bedtime. Allen Gould Taking Active   glucose blood (ACCU-CHEK GUIDE) test strip [id number] Check blood sugar 3 times a day LiIona BeardMD Taking Active Self  metFORMIN (GLUCOPHAGE) 500 MG tablet [id number] Take 1 tablet (500 mg total) by mouth  2 (two) times daily with a meal. [name], [name], MD Taking Active   metoprolol succinate (TOPROL XL) 25 MG 24 hr tablet [id number] Yes Take 1 tablet (25 mg total) by mouth at bedtime. Allen Friar, PA-C Taking Active   pantoprazole (PROTONIX) 40 MG tablet [id number] Yes Take 1 tablet (40 mg total) by mouth daily. Allen Hansen, MD Taking Active   valsartan (DIOVAN) 80 MG tablet [id number]  Yes Take 1 tablet (80 mg total) by mouth daily. Allen Hansen, MD Taking Active   vitamin B-12 (CYANOCOBALAMIN) 1000 MCG tablet [id number] Yes Take 1,000 mcg by mouth daily. [provider] Taking Active Self  Med List Note Lysle Rubens, CPhT 09/26/19 1059): [facility]            Patient Active Problem List   Diagnosis Date Noted   Normocytic anemia 11/14/2020   Acquired dilation of ascending aorta and aortic root (HCC)    Dilated aortic root (HCC)    Aortic stenosis    Hip pain 2/2 osteoarthritis 06/12/2020   Tachycardia-bradycardia syndrome ([place]) 06/05/2020   (HFpEF) heart failure with preserved ejection fraction ([place]) 06/02/2020   Severe depression ([place]) 04/24/2020   Physical deconditioning 04/24/2020   Second degree AV block, Mobitz type I    Bifascicular block    PAC (premature atrial contraction)    PVCs (premature ventricular contractions)    PAF (paroxysmal atrial fibrillation) ([place])    AKI (acute kidney injury) ([place]) 09/01/2019   Health care maintenance 03/01/2014   Dyslipidemia 03/01/2014   Morbid obesity ([place]) 08/29/2013   Insomnia 08/29/2013   DM (diabetes mellitus), type 2 ([place]) 08/10/2013   Hypertension    Asthma    GERD (gastroesophageal reflux disease)     Conditions to be addressed/monitored per PCP order:  CHF, HTN, and DMII  Care Plan : Heart Failure (Adult)  Updates made by Allen Montane, RN since 12/03/2020 12:00 AM     Problem: Symptom Exacerbation (Heart Failure)      Long-Range Goal: Symptom Exacerbation Prevented or Minimized   Start Date: 12/03/2020  Expected End Date: 03/05/2021  This Visit's Progress: On track  Priority: High  Note:   Current Barriers:  Knowledge deficit related to basic heart failure pathophysiology and self care management Financial strain Transportation Barriers Limited Social Support Does not adhere to prescribed medication regimen Lacks social connections Does not contact provider  office for questions/concerns Case Manager Clinical Goal(s):  patient will weigh self daily and record patient will verbalize understanding of Heart Failure Action Plan and when to call doctor patient will take all Heart Failure mediations as prescribed patient will weigh daily and record (notifying MD of 3 lb weight gain over night or 5 lb in a week) Interventions:  Basic overview and discussion of pathophysiology of Heart Failure reviewed  Provided verbal education on low sodium diet Advised patient to weigh each morning after emptying bladder Discussed importance of daily weight and advised patient to weigh and record daily Reviewed role of diuretics in prevention of fluid overload and management of heart failure Collaborate with MM Pharmacist for medication management Collaborate with MM BSW and CCM SW for financial, housing, food and transportation resources Provided therapeutic listening Patient Goals/Self-Care Activities - work with Wyatt Portela, [place] for assistance with resources for food, transportation, housing, and financial resources - begin a notebook of services in my neighborhood or community - follow-up on any referrals for help I am given - begin a heart failure diary - bring diary to all  appointments - eat more whole grains, fruits and vegetables, lean meats and healthy fats - know when to call the doctor - track symptoms and what helps feel better or worse  - contact provider with questions or concerns - call provider with weight gain of 3# in 24 hours or 5# in a week - take all medications as directed - work with MM Pharmacist, Ovid Curd for medication management Follow Up Plan: Telephone follow up appointment with care management team member scheduled for:01/05/21 @ 10:30am      Follow Up:  Patient agrees to Care Plan and Follow-up.  Plan: The Managed Medicaid care management team will reach out to the patient again over the next 30 days.  Date/time of next scheduled RN  care management/care coordination outreach:  01/05/21 @ 10:30am  [name] RN, [place] RN Care Coordinator

## 2020-12-03 NOTE — Patient Instructions (Addendum)
Visit Information  Mr. Widmer was given information about Medicaid Managed Care team care coordination services as a part of their [place] Medicaid benefit. [name] verbally consentedto engagement with the [facility] Managed Care team.   For questions related to your [facility] health plan, please call: [phone]  OR visit the member homepage at: [email or web address]  If you would like to schedule transportation through your [facility] plan, please call the following number at least 2 days in advance of your appointment: [phone]  If you are experiencing a behavioral health crisis, call the [facility] at [phone] 9282962436). The line is available 24 hours a day, seven days a week.  If you would like help to quit smoking, call 1-800-QUIT-NOW (740) 585-7214) OR Espaol: 1-855-Djelo-Ya (7-517-001-7494) o para ms informacin haga clic aqu or Text READY to 200-400 to register via text  Mr. Pickup - following are the goals we discussed in your visit today:   Goals Addressed             This Visit's Progress    Find Help in My Community       Timeframe:  Long-Range Goal Priority:  High Start Date:  12/03/20                           Expected End Date:  03/05/21                     Follow Up Date 01/05/21    - work with Wyatt Portela, [place] for assistance with resources for food, transportation, housing, and financial resources - begin a notebook of services in my neighborhood or community - follow-up on any referrals for help I am given    Why is this important?   Knowing how and where to find help for yourself or family in your neighborhood and community is an important skill.  You will want to take some steps to learn how.          Track and Manage Symptoms-Heart Failure       Timeframe:  Long-Range Goal Priority:   High Start Date:    12/03/20                         Expected End Date:  03/05/21                     Follow Up Date 01/05/21    - begin a heart failure diary - bring diary to all appointments - eat more whole grains, fruits and vegetables, lean meats and healthy fats - know when to call the doctor - track symptoms and what helps feel better or worse  - contact provider with questions or concerns - call provider with weight gain of 3# in 24 hours or 5# in a week - take all medications as directed - work with MM Pharmacist, Ovid Curd for medication management   Why is this important?   You will be able to handle your symptoms better if you keep track of them.  Making some simple changes to your lifestyle will help.  Eating healthy is one thing you can do to take good care of yourself.    Notes:          Please see education materials related to CHF provided by MyChart link.  Patient verbalizes understanding of instructions provided today.  Telephone follow up appointment with Managed Medicaid care management team member scheduled for:01/05/21 @ 10:30am  [name] RN, BSN [facility] RN Care Coordinator   Following is a copy of your plan of care:  Patient Care Plan: Heart Failure (Adult)     Problem Identified: Symptom Exacerbation (Heart Failure)      Long-Range Goal: Symptom Exacerbation Prevented or Minimized   Start Date: 12/03/2020  Expected End Date: 03/05/2021  This Visit's Progress: On track  Priority: High  Note:   Current Barriers:  Knowledge deficit related to basic heart failure pathophysiology and self care management Financial strain Transportation Barriers Limited Social Support Does not adhere to prescribed medication regimen Lacks social connections Does not contact provider office for questions/concerns Case Manager Clinical Goal(s):  patient will weigh self daily and record patient will verbalize understanding of  Heart Failure Action Plan and when to call doctor patient will take all Heart Failure mediations as prescribed patient will weigh daily and record (notifying MD of 3 lb weight gain over night or 5 lb in a week) Interventions:  Basic overview and discussion of pathophysiology of Heart Failure reviewed  Provided verbal education on low sodium diet Advised patient to weigh each morning after emptying bladder Discussed importance of daily weight and advised patient to weigh and record daily Reviewed role of diuretics in prevention of fluid overload and management of heart failure Collaborate with MM Pharmacist for medication management Collaborate with MM BSW and CCM SW for financial, housing, food and transportation resources Provided therapeutic listening Patient Goals/Self-Care Activities - work with Risk manager, [place] for assistance with resources for food, transportation, housing, and financial resources - begin a notebook of services in my neighborhood or community - follow-up on any referrals for help I am given - begin a heart failure diary - bring diary to all appointments - eat more whole grains, fruits and vegetables, lean meats and healthy fats - know when to call the doctor - track symptoms and what helps feel better or worse  - contact provider with questions or concerns - call provider with weight gain of 3# in 24 hours or 5# in a week - take all medications as directed - work with MM Pharmacist, Ovid Curd for medication management Follow Up Plan: Telephone follow up appointment with care management team member scheduled for:01/05/21 @ 10:30am

## 2020-12-04 ENCOUNTER — Ambulatory Visit: Payer: Medicaid Other | Admitting: Licensed Clinical Social Worker

## 2020-12-04 NOTE — Chronic Care Management (AMB) (Signed)
  Care Management   Social Work Visit Note  12/04/2020 Name: Allen Gould MRN: [id number] DOB: Mar 02, 1966  Allen Gould is a 55 y.o. year old male who sees [name], MD for primary care. The care management team was consulted for assistance with care management and care coordination needs related to Transportation Needs    Patient was given the following information about care management and care coordination services today, agreed to services, and gave verbal consent: 1.care management/care coordination services include personalized support from designated clinical staff supervised by their physician, including individualized plan of care and coordination with other care providers 2. 24/7 contact phone numbers for assistance for urgent and routine care needs. 3. The patient may stop care management/care coordination services at any time by phone call to the office staff.  Engaged with patient by telephone for follow up visit in response to provider referral for social work chronic care management and care coordination services.  Assessment: Review of patient history, allergies, and health status during evaluation of patient need for care management/care coordination services.    Interventions:  Patient interviewed and appropriate assessments performed Collaborated with clinical team regarding patient needs  Patient stated he had barriers with transportation to and from grocery stores. Patient stated he has an upcoming appointment at [facility]. SW will have (2) 1 way bus passes for patient when he arrives.   SDOH (Social Determinants of Health) assessments performed: No     Plan:  patient will work with BSW to address needs related to [place], [place] Worker [facility] Care Management  [phone]

## 2020-12-04 NOTE — Telephone Encounter (Signed)
Let patient know that kidney fxn abnormal from [facility] 11/28/20. Told him to take valsartan 77m instead of 828mas recently changed and to follow-up for BMP and BP check in 2 weeks

## 2020-12-04 NOTE — Patient Instructions (Signed)
Visit Information  Instructions: patient will work with SW to address concerns related to transportation.  Patient was given the following information about care management and care coordination services today, agreed to services, and gave verbal consent: 1.care management/care coordination services include personalized support from designated clinical staff supervised by their physician, including individualized plan of care and coordination with other care providers 2. 24/7 contact phone numbers for assistance for urgent and routine care needs. 3. The patient may stop care management/care coordination services at any time by phone call to the office staff.  Patient verbalizes understanding of instructions provided today and agrees to view in [place].   No further follow up required: .  [name], [place]  Social Worker [facility] Care Management  [phone]

## 2020-12-08 NOTE — Progress Notes (Signed)
Internal Medicine Clinic Attending  Case discussed with Dr. [name]  At the time of the visit.  We reviewed the resident's history and exam and pertinent patient test results.  I agree with the assessment, diagnosis, and plan of care documented in the resident's note.  

## 2020-12-08 NOTE — Progress Notes (Signed)
Internal Medicine Clinic Attending  I saw and evaluated the patient.  I personally confirmed the key portions of the history and exam documented by Dr.  Marlou Sa  and I reviewed pertinent patient test results.  The assessment, diagnosis, and plan were formulated together and I agree with the documentation in the resident's note. In f/u, [name] reported an excellent experience with Dr. Ronelle Nigh initial phone consultation and he was looking forward to continuing therapy with her.

## 2020-12-09 ENCOUNTER — Telehealth (HOSPITAL_COMMUNITY): Payer: Self-pay | Admitting: *Deleted

## 2020-12-09 NOTE — Telephone Encounter (Signed)
Close encounter 

## 2020-12-10 ENCOUNTER — Ambulatory Visit (HOSPITAL_COMMUNITY)
Admission: RE | Admit: 2020-12-10 | Payer: Medicaid Other | Source: Ambulatory Visit | Attending: Cardiology | Admitting: Cardiology

## 2020-12-10 ENCOUNTER — Encounter (HOSPITAL_COMMUNITY): Payer: Medicaid Other

## 2020-12-11 ENCOUNTER — Other Ambulatory Visit: Payer: Self-pay

## 2020-12-12 ENCOUNTER — Telehealth (HOSPITAL_COMMUNITY): Payer: Self-pay | Admitting: *Deleted

## 2020-12-12 NOTE — Telephone Encounter (Signed)
Close encounter 

## 2020-12-15 ENCOUNTER — Telehealth: Payer: Self-pay

## 2020-12-15 MED ORDER — DULOXETINE HCL 60 MG PO CPEP: 60.0000 mg | ORAL_CAPSULE | Freq: Every day | ORAL | 2 refills | Status: DC

## 2020-12-15 MED ORDER — BUPROPION HCL 75 MG PO TABS: 75.0000 mg | ORAL_TABLET | Freq: Two times a day (BID) | ORAL | 3 refills | Status: DC

## 2020-12-15 NOTE — Telephone Encounter (Signed)
I refilled these meds

## 2020-12-15 NOTE — Telephone Encounter (Signed)
Received TC from patient who states he saw Dr. Marlou Sa on 7/8, received 2 RX's for Wellbutrin and Duloxetine, but RX's will not go through because MD is not medicaid approved per his pharmacist.    Will route to attending pool and PCP to please resend both RX's. Will also route to office manager. Thank you SChaplin, RN,BSN

## 2020-12-16 ENCOUNTER — Ambulatory Visit: Payer: Medicaid Other | Admitting: Behavioral Health

## 2020-12-16 ENCOUNTER — Ambulatory Visit (INDEPENDENT_AMBULATORY_CARE_PROVIDER_SITE_OTHER): Payer: Medicaid Other | Admitting: Student

## 2020-12-16 ENCOUNTER — Ambulatory Visit (HOSPITAL_COMMUNITY)
Admission: RE | Admit: 2020-12-16 | Discharge: 2020-12-16 | Disposition: A | Discharge status: Home / Self Care | Payer: Medicaid Other | Source: Ambulatory Visit | Attending: Internal Medicine | Admitting: Internal Medicine

## 2020-12-16 ENCOUNTER — Other Ambulatory Visit: Payer: Self-pay

## 2020-12-16 VITALS — BP 133/87 | HR 82 | Temp 97.9°F | Ht 70.0 in | Wt 311.6 lb

## 2020-12-16 DIAGNOSIS — I517 Cardiomegaly: Secondary | ICD-10-CM | POA: Insufficient documentation

## 2020-12-16 DIAGNOSIS — I5032 Chronic diastolic (congestive) heart failure: Secondary | ICD-10-CM | POA: Diagnosis present

## 2020-12-16 DIAGNOSIS — N179 Acute kidney failure, unspecified: Secondary | ICD-10-CM

## 2020-12-16 DIAGNOSIS — F333 Major depressive disorder, recurrent, severe with psychotic symptoms: Secondary | ICD-10-CM

## 2020-12-16 DIAGNOSIS — F322 Major depressive disorder, single episode, severe without psychotic features: Secondary | ICD-10-CM

## 2020-12-16 DIAGNOSIS — I10 Essential (primary) hypertension: Secondary | ICD-10-CM | POA: Diagnosis not present

## 2020-12-16 MED ORDER — TECHNETIUM TC 99M PYROPHOSPHATE
20.4000 | Freq: Once | INTRAVENOUS | Status: AC
  Administered 2020-12-16: 20.4 via INTRAVENOUS

## 2020-12-16 NOTE — Assessment & Plan Note (Addendum)
Assessment: Current regimen of amlodipine 10 mg daily, Toprol 25 mg XL daily valsartan 40 mg (recently 80 mg) daily.  Blood pressure of 133/87.  Plan: -Continue regimen as per above, will adjust pending BMP results

## 2020-12-16 NOTE — Assessment & Plan Note (Addendum)
Assessment: Patient quite tearful on my exam today.  His PHQ-9 during his prior visit was 26.  He states that he is struggling in multiple aspects of his life; dealing with his medical history, social history, and chronic pain.  States that he used to be a workaholic but is unable to anymore and has been denied for disability for the past 3 years.  He is uncertain as to why he has been denied in the past.  He was given resources during his last visit with Dr. Marlou Sa.  She increased his Cymbalta and started him on Wellbutrin, however he was unable to pick this up due to medication prescription issues with her new residence.  These were rewritten yesterday.  Patient states that since being off his Cymbalta he has experienced some hallucinations.  Do not suspect psychosis.  Discussed with him that I think that this is secondary to him being off of his Cymbalta.  Discussed that we need him back on this today, patient is agreeable to this.  Plan: -Continue Wellbutrin 75 mg twice daily and Cymbalta 60 daily -Patient to meet with Dr. Carolynne Edouard today -We will continue to work with patient and assist him in his medical as well as social issues

## 2020-12-16 NOTE — BH Specialist Note (Signed)
Integrated Behavioral Health Follow Up In-Person Visit  MRN: [id number] Name: Allen Gould  Number of [place] Clinician visits: 2/6 Session Start time: 9:45am  Session End time: 10:20am Total time: 35  minutes; approximately  Types of Service: Individual psychotherapy  Interpretor:No. Interpretor Name and Language: n/a   Subjective: Allen Gould is a 55 y.o. male accompanied by  self Patient was referred by Dr. Morrison Old for mental health wellness check. Patient reports the following symptoms/concerns: elevated anx/dep due to living situation & cont'd stress over being treated poorly by Niece & her children @ the residence. Pt is in pain when he walks & this makes life difficult. Pt is not eating well due to the myriad of factors he is encountering. Duration of problem: months; Severity of problem: moderate; w/severe distress rxn today that is overwhelming to Pt. 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  Objective: Mood: Anxious, Depressed, and Hopeless and Affect: Tearful Risk of harm to self or others: No plan to harm self or others, & w/passive thoughts of not wanting to be here daily.  Pt reports he is seeing ppl who are not there & knows this. Taught Pt to clear the image by swiping his hand through the image w/a Px motion to clear his head.  Life Context: Family and Social: Pt lives w/his Niece after moving from his Str's home. Niece has several children who are rude to him & eat any food he brings into the home. He has gone 4 days out of the week w/o eating dinner. 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 He also lifts heavy crates of food.  Self-Care: Pt does his best to get around to his appts & work. He does not feel like he can handle all of this well today.  Life Changes: Pt health status has changed & he is not adjusting well to this transition.  Reassured Pt we will cont to meet.  Patient and/or Family's Strengths/Protective Factors: Social and Emotional competence and Pt resilience factors need bolstering. Pt knows right from wrong & does not like tx by Family.  Goals Addressed: Patient will:  Reduce symptoms of: anxiety, depression, stress, and irritation.    Increase knowledge and/or ability of: coping skills, healthy habits, stress reduction, and assistance in his adjustment to his changing health status.    Demonstrate ability to: Increase healthy adjustment to current life circumstances and Increase adequate support systems for patient/family  Progress towards Goals: Ongoing; Pt needing extra support today for personal situation. He is psychologically overwhelmed & feels vulnerable. He has never been so unhealthy before & wants to do his best.  Pt is struggling to make an income & has severe food insecurity. Provided Pt w/Food Bank List per Josephine Igo, RN, BSN, CCM. Also provided Pt w/a grocery bag of food items to assist him. Meals on Wheels has low funding & is currently on a 6 mos Wait List. [facility] has d/c'd its Meal Prog.   Interventions: Interventions utilized:  Solution-Focused Strategies, Behavioral Activation, and Supportive Counseling Standardized Assessments completed:  screeners prn  Patient and/or Family Response: Pt receptive to discussion/consultation today per Dr. Milford Cage. He realizes he cannot cont to hold all his feelings inside. He needs to reach out for help like he has done today. Pt requests future Cslg appt to assist/support him to improved coping.  Patient Centered Plan: Patient is  on the following Treatment Plan(s): Ongoing psychotherapy to problem-solve, support, & assist Pt w/avail resources. Agreed w/Pt he may eventually need a place of his own.    Assessment: Patient currently experiencing severe food insecurity which is impacting his mental health wellness via psychological overwhelm. Pt is  also impacted in areas of self-esteem, self-confidence & self-worth. Pt feels emasculated unable to care for himself. Pt has reached out to [facility] today for support.  Patient may benefit from cont'd psychotherapy & attn to his resource needs.  Plan: Follow up with behavioral health clinician on : 2-3 wks for visit on telehealth for 60 min Behavioral recommendations: Find a way to store the food given today. Share w/realistic expectations of Family they be respectful & polite.  Referral(s): [place] (In Clinic) and [employer] Resources:  Biomedical scientist "From scale of 1-10, how likely are you to follow plan?": [place], LMFT

## 2020-12-16 NOTE — Assessment & Plan Note (Addendum)
Assessment: Patient with creatinine of 1.59 2 weeks ago.   During his last visit his losartan was decreased from 80 to 40 mg until repeat BMP was done. We will plan to repeat today and adjust his medication dosing as needed.  Patient has had a prerenal AKI in the past, and more recently his Lasix was stopped.  If persistent elevations in creatinine and worsening GFR, patient will need further work-up of his AKI and renal ultrasound.  Plan: -Repeat BMP pending  Addendum: Minimal improvement of creatinine. Patient will need to return in 2 weeks for repeat BMP. Will reach out to front desk to schedule this as well as follow up appointment to see how patient is doing. Also hyperkalemic on BMP

## 2020-12-16 NOTE — Patient Instructions (Addendum)
Thank you, Mr.Jordie A Palmero for allowing [place] to provide your care today. Today we discussed   Elevated Blood Pressure Your blood pressure looks great today! Please keep up the excellent work. Continue to stay hydrated and continue your medications  Kidney Function We are doing some blood work today to assess your kidney function. Please continue to take your medications as prescribed and we will reassess once those labs result.   Social Concerns We will be putting in a referral to have our social work team assist you. Please continue to work with them and Dr. Theodis Shove. If you need anything at all [place] and ask to speak with one of [place].   Medications Please restart all of your medications today  I have ordered the following labs for you:   Lab Orders  BMP8+Anion Gap     Referrals ordered today:   Referral Orders  No referral(s) requested today     I have ordered the following medication/changed the following medications:   Stop the following medications: There are no discontinued medications.   Start the following medications: No orders of the defined types were placed in this encounter.    Follow up:  4 week follow up or sooner if you need     Should you have any questions or concerns please call the internal medicine clinic at [phone].     Sanjuana Letters, D.O. [place]

## 2020-12-16 NOTE — Progress Notes (Signed)
CC: Hypertension, AKI  HPI:  Allen Gould is a 55 y.o. male with a past medical history stated below and presents today for follow-up concerning his hypertension and repeat labs for AKI. Please see problem based assessment and plan for additional details.  Past Medical History:  Diagnosis Date   Acquired dilation of ascending aorta and aortic root ([place])    42m by echo 09/2020 but normal on Chest CTA 05/2020   [place] ([place] 09/01/2019   Aortic stenosis    mild with mean AVG 169mg by echo 09/2019   Asthma    Balanitis 08/09/2014   Bifascicular block    Cough 08/09/2014   Diabetes mellitus without complication ([place]   Dilated aortic root ([place]   3947my echo 09/2019   DM (diabetes mellitus) type 2, uncontrolled, without ketoacidosis 08/10/2013   Dyslipidemia 03/01/2014   Financial difficulties 08/09/2014   GERD (gastroesophageal reflux disease)    Health care maintenance 03/01/2014   Hypertension    Hypertensive urgency 05/29/2017   Hypomagnesemia 05/29/2017   Insomnia 08/29/2013   Morbid obesity ([facility]CC[place]/12/2013   Near syncope 05/29/2017   Odontogenic infection of jaw 05/29/2017   PAC (premature atrial contraction)    PAF (paroxysmal atrial fibrillation) (HCC)    Pressure injury of skin 09/02/2019   PVCs (premature ventricular contractions)    Right shoulder pain 08/29/2013   Second degree AV block, Mobitz type I    Sepsis ([place]/10/2017   Sleep apnea    uses cpap    Current Outpatient Medications on File Prior to Visit  Medication Sig Dispense Refill   Accu-Chek Softclix Lancets lancets Check blood sugar 3 times a day 100 each 12   amLODipine (NORVASC) 10 MG tablet Take 1 tablet (10 mg total) by mouth daily. 90 tablet 3   apixaban (ELIQUIS) 5 MG TABS tablet Take 1 tablet (5 mg total) by mouth 2 (two) times daily. 180 tablet 3   aspirin 81 MG EC tablet Take 81 mg by mouth daily. Swallow whole.     atorvastatin (LIPITOR) 80 MG tablet Take 1 tablet (80 mg total) by mouth  daily. 90 tablet 3   buPROPion (WELLBUTRIN) 75 MG tablet Take 1 tablet (75 mg total) by mouth 2 (two) times daily. 30 tablet 3   Dulaglutide (TRULICITY) 1.5 MG/FV/4.9SWPN Inject 0.21m48me time per week for the first four weeks, followed by 1.5mg 87mreafter 10 mL 1   DULoxetine (CYMBALTA) 60 MG capsule Take 1 capsule (60 mg total) by mouth daily. 30 capsule 2   furosemide (LASIX) 40 MG tablet Take 1 tablet (40 mg total) by mouth daily. (Patient not taking: Reported on 12/03/2020) 90 tablet 1   gabapentin (NEURONTIN) 300 MG capsule Take 1 capsule (300 mg total) by mouth in the morning, at noon, and at bedtime. 270 capsule 1   glucose blood (ACCU-CHEK GUIDE) test strip Check blood sugar 3 times a day 100 each 12   metFORMIN (GLUCOPHAGE) 500 MG tablet Take 1 tablet (500 mg total) by mouth 2 (two) times daily with a meal. 180 tablet 1   metoprolol succinate (TOPROL XL) 25 MG 24 hr tablet Take 1 tablet (25 mg total) by mouth at bedtime. 90 tablet 3   pantoprazole (PROTONIX) 40 MG tablet Take 1 tablet (40 mg total) by mouth daily. 90 tablet 3   valsartan (DIOVAN) 80 MG tablet Take 1 tablet (80 mg total) by mouth daily. 90 tablet 1   vitamin B-12 (  CYANOCOBALAMIN) 1000 MCG tablet Take 1,000 mcg by mouth daily.     No current facility-administered medications on file prior to visit.    Family History  Problem Relation Age of Onset   Kidney disease Mother    Diabetes Sister    Hyperlipidemia Sister    Heart disease Other     Social History   Socioeconomic History   Marital status: Single    Spouse name: Not on file   Number of children: Not on file   Years of education: Not on file   Highest education level: Not on file  Occupational History   Not on file  Tobacco Use   Smoking status: Former    Types: Cigarettes    Quit date: 04/24/2005    Years since quitting: 15.6   Smokeless tobacco: Never  Vaping Use   Vaping Use: Never used  Substance and Sexual Activity   Alcohol use: Not  Currently    Alcohol/week: 2.0 standard drinks    Types: 2 Cans of beer per week    Comment: Last drink 8 months ago   Drug use: No   Sexual activity: Not on file  Other Topics Concern   Not on file  Social History Narrative   Not on file   Social Determinants of Health   Financial Resource Strain: High Risk   Difficulty of Paying Living Expenses: Very hard  Food Insecurity: Food Insecurity Present   Worried About Charity fundraiser in the Last Year: Sometimes true   Arboriculturist in the Last Year: Sometimes true  Transportation Needs: Unmet Transportation Needs   Lack of Transportation (Medical): No   Lack of Transportation (Non-Medical): Yes  Physical Activity: Not on file  Stress: Not on file  Social Connections: Not on file  Intimate Partner Violence: Not on file    Review of Systems: ROS negative except for what is noted on the assessment and plan.  Vitals:   12/16/20 0854  BP: 133/87  Pulse: 82  Temp: 97.9 F (36.6 C)  TempSrc: Oral  SpO2: 99%  Weight: (!) 311 lb 9.6 oz (141.3 kg)  Height: 5' 10"  (1.778 m)   Physical Exam: Constitutional: No acute distress HENT: normocephalic atraumatic Eyes: conjunctiva non-erythematous Neck: supple Cardiovascular: regular rate.  Lower extremities without edema Pulmonary/Chest: normal work of breathing on room air MSK: normal bulk and tone Neurological: alert & oriented x 3 Skin: warm and dry Psych: Tearful  Assessment & Plan:   See Encounters Tab for problem based charting.  Patient discussed with Dr. Newell Coral, D.O. [facility], PGY-2 Pager: [phone], Phone: [phone] Date 12/16/2020 Time 5:33 PM

## 2020-12-17 ENCOUNTER — Other Ambulatory Visit: Payer: Self-pay

## 2020-12-17 LAB — BMP8+ANION GAP
Anion Gap: 15 mmol/L (ref 10.0–18.0)
BUN/Creatinine Ratio: 19 (ref 9–20)
BUN: 28 mg/dL — ABNORMAL HIGH (ref 6–24)
CO2: 21 mmol/L (ref 20–29)
Calcium: 9.8 mg/dL (ref 8.7–10.2)
Chloride: 104 mmol/L (ref 96–106)
Creatinine, Ser: 1.51 mg/dL — ABNORMAL HIGH (ref 0.76–1.27)
Glucose: 117 mg/dL — ABNORMAL HIGH (ref 65–99)
Potassium: 5.4 mmol/L — ABNORMAL HIGH (ref 3.5–5.2)
Sodium: 140 mmol/L (ref 134–144)
eGFR: 54 mL/min/{1.73_m2} — ABNORMAL LOW (ref >59)

## 2020-12-19 ENCOUNTER — Ambulatory Visit: Payer: Medicaid Other | Admitting: Licensed Clinical Social Worker

## 2020-12-19 NOTE — Addendum Note (Signed)
Addended by: Riesa Pope on: 12/19/2020 04:15 PM   Modules accepted: Orders

## 2020-12-19 NOTE — Chronic Care Management (AMB) (Signed)
  Care Management   Social Work Visit Note  12/19/2020 Name: Allen Gould MRN: [id number] DOB: 1966-05-14  Allen Gould is a 55 y.o. year old male who sees [name], MD for primary care. The care management team was consulted for assistance with care management and care coordination needs related to [facility] Resources    Patient was given the following information about care management and care coordination services today, agreed to services, and gave verbal consent: 1.care management/care coordination services include personalized support from designated clinical staff supervised by their physician, including individualized plan of care and coordination with other care providers 2. 24/7 contact phone numbers for assistance for urgent and routine care needs. 3. The patient may stop care management/care coordination services at any time by phone call to the office staff.  Engaged with patient by telephone for follow up visit in response to provider referral for social work chronic care management and care coordination services.  Assessment: Review of patient history, allergies, and health status during evaluation of patient need for care management/care coordination services.    Interventions:  Patient interviewed and appropriate assessments performed Collaborated with clinical team regarding patient needs  Patient advised he is still connecting with [facility]. SW texted patient the contact information.  Patient reported utilizing food pantries.  SW emailed additional resources to patient for shelters and food.       Plan:  patient will work with BSW to address needs related to Housing barriers SW will follow up in 30-days.  [name], [place]  Social Worker [facility] Care Management  [phone]

## 2020-12-19 NOTE — Patient Instructions (Signed)
Visit Information  Instructions: patient will work with SW to address concerns related to housing  Patient was given the following information about care management and care coordination services today, agreed to services, and gave verbal consent: 1.care management/care coordination services include personalized support from designated clinical staff supervised by their physician, including individualized plan of care and coordination with other care providers 2. 24/7 contact phone numbers for assistance for urgent and routine care needs. 3. The patient may stop care management/care coordination services at any time by phone call to the office staff.  Patient verbalizes understanding of instructions provided today and agrees to view in [place].   The care management team will reach out to the patient again over the next 30 days.   [name], [place]  Social Worker [facility] Care Management  [phone]

## 2020-12-21 NOTE — Progress Notes (Signed)
Internal Medicine Clinic Attending  Case discussed with Dr. Johnney Ou  At the time of the visit.  We reviewed the resident's history and exam and pertinent patient test results.  I agree with the assessment, diagnosis, and plan of care documented in the resident's note.  Allen Gould is dealing with a host of medical, functional, social, and emotional challenges and is being established with resources, though his mental health remains significantly negatively impacted.  Close f/u for his medical conditions has added benefit of "touch points" to gauge his emotional well being and to provide support.

## 2020-12-23 ENCOUNTER — Ambulatory Visit (INDEPENDENT_AMBULATORY_CARE_PROVIDER_SITE_OTHER): Payer: Medicare Other

## 2020-12-23 DIAGNOSIS — I495 Sick sinus syndrome: Secondary | ICD-10-CM | POA: Diagnosis not present

## 2020-12-24 ENCOUNTER — Ambulatory Visit: Payer: Medicaid Other | Admitting: Behavioral Health

## 2020-12-24 DIAGNOSIS — F331 Major depressive disorder, recurrent, moderate: Secondary | ICD-10-CM

## 2020-12-24 DIAGNOSIS — F419 Anxiety disorder, unspecified: Secondary | ICD-10-CM

## 2020-12-24 LAB — CUP PACEART REMOTE DEVICE CHECK
Battery Remaining Longevity: 112 mo
Battery Remaining Percentage: 95.5 %
Battery Voltage: 3.01 V
Brady Statistic AP VP Percent: 4.7 %
Brady Statistic AP VS Percent: 1 %
Brady Statistic AS VP Percent: 95 %
Brady Statistic AS VS Percent: 1 %
Brady Statistic RA Percent Paced: 4.2 %
Brady Statistic RV Percent Paced: 99 %
Date Time Interrogation Session: 20220802061338
Implantable Lead Implant Date: 20220201
Implantable Lead Implant Date: 20220201
Implantable Lead Location: 753859
Implantable Lead Location: 753860
Implantable Pulse Generator Implant Date: 20220201
Lead Channel Impedance Value: 360 Ohm
Lead Channel Impedance Value: 480 Ohm
Lead Channel Pacing Threshold Amplitude: 0.5 V
Lead Channel Pacing Threshold Amplitude: 0.625 V
Lead Channel Pacing Threshold Pulse Width: 0.4 ms
Lead Channel Pacing Threshold Pulse Width: 0.4 ms
Lead Channel Sensing Intrinsic Amplitude: 10.3 mV
Lead Channel Sensing Intrinsic Amplitude: 5 mV
Lead Channel Setting Pacing Amplitude: 0.875
Lead Channel Setting Pacing Amplitude: 2 V
Lead Channel Setting Pacing Pulse Width: 0.4 ms
Lead Channel Setting Sensing Sensitivity: 2 mV
Pulse Gen Model: 2272
Pulse Gen Serial Number: 3895834

## 2020-12-24 NOTE — BH Specialist Note (Addendum)
Integrated Behavioral Health via Telemedicine Visit  12/24/2020 Allen Gould [id number]  Number of Integrated Behavioral Health visits: 3/6 Session Start time: 11:50am  Session End time: 12:20pm Total time: 30  Referring Provider: Dr. Morrison Old, DO Patient/Family location: Pt is home in private [facility] Provider location: [facility] Office  All persons participating in visit: Pt & Clinician Types of Service: Individual psychotherapy  I connected with [name] and/or Alisa Graff Haven's  self  via  Animal nutritionist  (Video is Tree surgeon) and verified that I am speaking with the correct person using two identifiers. Discussed confidentiality:  3rd visit  I discussed the limitations of telemedicine and the availability of in person appointments.  Discussed there is a possibility of technology failure and discussed alternative modes of communication if that failure occurs.  I discussed that engaging in this telemedicine visit, they consent to the provision of behavioral healthcare and the services will be billed under their insurance.  Patient and/or legal guardian expressed understanding and consented to Telemedicine visit:  3rd visit  Presenting Concerns: Patient and/or family reports the following symptoms/concerns: elevated anxiety due to lack of work this week since Sunday Duration of problem: yrs of immobility due to hip pain & regain of mobility in the past few yrs; Severity of problem: severe  Patient and/or Family's Strengths/Protective Factors: Social and Emotional competence and Pt resilience dvlp'd over yrs of struggle w/his health  Goals Addressed: Patient will:  Reduce symptoms of: anxiety, depression, stress, and adjustment to health status changes    Increase knowledge and/or ability of: coping skills, healthy habits, and stress reduction   Demonstrate ability to: Increase healthy adjustment to current life circumstances,  Increase adequate support systems for patient/family, and Begin healthy grieving over loss  Progress towards Goals: Ongoing  Interventions: Interventions utilized:  Solution-Focused Strategies and Supportive Counseling Standardized Assessments completed:  screeners prn  Patient and/or Family Response: Pt receptive to call today & requests future appts  Assessment: Patient currently experiencing inc in coping. He is caring for himself & managing his healthcare to the best of his ability. It is helping him cope to find acceptance in his situation. He cannot change all aspects of his health, but he is, "doing my best".  Pt has, "laid back from the intensity of my situation".  Patient may benefit from cont'd support w/bi-weekly ck-in sessions. Pt also needs resource for his medication costs.   Plan: Follow up with behavioral health clinician on : 2-3 wks on telehealth for 30 min Behavioral recommendations: Call Dr. Eunice Blase & inquire about your concerns for your cortisone shots. Rosendo Gros H will connect about the MAP Prog. Keep cooking-you love it & the whole house benefits-create Family time for your Niece & her children. Referral(s): [place] (In Clinic), & Referral to Hortencia Pilar for addt'l resources for securing his medications using [place] & MAP.  I discussed the assessment and treatment plan with the patient and/or parent/guardian. They were provided an opportunity to ask questions and all were answered. They agreed with the plan and demonstrated an understanding of the instructions.   They were advised to call back or seek an in-person evaluation if the symptoms worsen or if the condition fails to improve as anticipated.  Donnetta Hutching, LMFT

## 2020-12-25 ENCOUNTER — Telehealth: Payer: Self-pay

## 2020-12-25 NOTE — Telephone Encounter (Signed)
Spoke to pt about prescription affordability options. Informed him that with some forms of medicaid pharmacies are not to deny pt's need for medication and should waive the copay. If this doesn't work at his pharmacy (or if this isnt the type of medicaid he has), I let pt know that we could transfer his meds to [place]. Since he is insured he is eligible to have a charge account there. Pt has my direct line and will call back with any issues/questions he has.

## 2020-12-26 ENCOUNTER — Telehealth: Payer: Self-pay

## 2020-12-26 DIAGNOSIS — I5032 Chronic diastolic (congestive) heart failure: Secondary | ICD-10-CM

## 2020-12-26 DIAGNOSIS — I517 Cardiomegaly: Secondary | ICD-10-CM

## 2020-12-26 NOTE — Telephone Encounter (Signed)
-----   Message from [name], MD sent at 12/19/2020  4:42 PM EDT ----- PYP scan equivocal for amyloid - please order an SPEP and UPEP.  Please call MRI and find out what their cut off Serum creatinine is for cMRI with gad to rule out amyloid

## 2020-12-26 NOTE — Telephone Encounter (Signed)
The patient has been notified of the result and verbalized understanding.  All questions (if any) were answered. Antonieta Iba, RN 12/26/2020 2:50 PM  Lab results ordered. MRI ordered.

## 2020-12-29 ENCOUNTER — Ambulatory Visit: Payer: Medicaid Other | Admitting: Behavioral Health

## 2020-12-29 ENCOUNTER — Ambulatory Visit: Payer: Medicaid Other | Admitting: Family Medicine

## 2020-12-29 DIAGNOSIS — F419 Anxiety disorder, unspecified: Secondary | ICD-10-CM

## 2020-12-29 DIAGNOSIS — F331 Major depressive disorder, recurrent, moderate: Secondary | ICD-10-CM

## 2020-12-29 NOTE — BH Specialist Note (Signed)
Integrated Behavioral Health via Telemedicine Visit  12/29/2020 Allen Gould [id number]  Number of [place] visits: 4/6 Session Start time: 10:30am  Session End time: 11:00am Total time: 30  Referring Provider: Dr. Morrison Old, DO Patient/Family location: Pt is located @ [facility] since 4 days ago; he dealt w/bed bugs the first 2 nights. [place] has since treated for this & it is resolved.  [facility] Provider location: [facility] Office  All persons participating in visit: Pt & Clinician Types of Service: Individual psychotherapy  I connected with Allen Gould and/or Allen Gould's  self  via  Animal nutritionist  (Video is Tree surgeon) and verified that I am speaking with the correct person using two identifiers. Discussed confidentiality:  4th visit  I discussed the limitations of telemedicine and the availability of in person appointments.  Discussed there is a possibility of technology failure and discussed alternative modes of communication if that failure occurs.  I discussed that engaging in this telemedicine visit, they consent to the provision of behavioral healthcare and the services will be billed under their insurance.  Patient and/or legal guardian expressed understanding and consented to Telemedicine visit:  4th visit  Presenting Concerns: Patient and/or family reports the following symptoms/concerns: elevated concern for accomplishing Disability ppw Duration of problem: months; Severity of problem: moderate  Patient and/or Family's Strengths/Protective Factors: Social connections, Social and Emotional competence, and Sense of purpose  Goals Addressed: Patient will:  Reduce symptoms of: anxiety, depression, and stress   Increase knowledge and/or ability of: coping skills and stress reduction   Demonstrate ability to: Increase healthy adjustment to current life circumstances and Increase adequate  support systems for patient/family  Progress towards Goals: Ongoing  Interventions: Interventions utilized:  Behavioral Activation Standardized Assessments completed:  screeners prn  Patient and/or Family Response: Pt receptive to call today & requests future ck-ins  Assessment: Patient currently experiencing reduction of anx/dep due to dec in housing insecurity. [employer] is improved conditions; no bugs, no food situation @ his Niece's home, & inc'd self esteem due to securing his medications, improved levels of self-confidence & independence & inc'd renewal of his humor!  Patient may benefit from cont'd ck-ins via telehealth.  Plan: Follow up with behavioral health clinician on : 2-3 wks for 30 min chk-in for mental health wellness. Behavioral recommendations: Stay busy & realistic. Maintain your positive perspective! Referral(s): [place] (In Clinic)  I discussed the assessment and treatment plan with the patient and/or parent/guardian. They were provided an opportunity to ask questions and all were answered. They agreed with the plan and demonstrated an understanding of the instructions.   They were advised to call back or seek an in-person evaluation if the symptoms worsen or if the condition fails to improve as anticipated.  Donnetta Hutching, LMFT

## 2020-12-30 ENCOUNTER — Ambulatory Visit: Payer: Medicaid Other | Admitting: Internal Medicine

## 2020-12-30 ENCOUNTER — Encounter: Payer: Self-pay | Admitting: Internal Medicine

## 2020-12-30 VITALS — BP 127/74 | HR 59 | Temp 98.4°F | Wt 314.4 lb

## 2020-12-30 DIAGNOSIS — N179 Acute kidney failure, unspecified: Secondary | ICD-10-CM | POA: Diagnosis not present

## 2020-12-30 DIAGNOSIS — I5032 Chronic diastolic (congestive) heart failure: Secondary | ICD-10-CM

## 2020-12-30 DIAGNOSIS — F322 Major depressive disorder, single episode, severe without psychotic features: Secondary | ICD-10-CM | POA: Diagnosis not present

## 2020-12-30 NOTE — Patient Instructions (Addendum)
Thank you for trusting me with your care. To recap, today we discussed the following:   1. Chronic heart failure with preserved ejection fraction ([place]) - we will check you blood work today. Continue your lasix daily until I call and discuss results.   2. Depression -  No changes to medications today.

## 2020-12-30 NOTE — Progress Notes (Signed)
   CC: HFpEF and Major Depression Disorder  HPI:Allen Gould is a 55 y.o. male who presents for evaluation of  HfpEF and MDD. Please see individual problem based A/P for details.   Past Medical History:  Diagnosis Date   Acquired dilation of ascending aorta and aortic root ([place])    57m by echo 09/2020 but normal on Chest CTA 05/2020   Acute kidney injury (HE[facility] [place] 09/01/2019   Aortic stenosis    mild with mean AVG 133mg by echo 09/2019   Asthma    Balanitis 08/09/2014   Bifascicular block    Cough 08/09/2014   Diabetes mellitus without complication (H[place]   Dilated aortic root (HCC[place]   3980my echo 09/2019   DM (diabetes mellitus) type 2, uncontrolled, without ketoacidosis 08/10/2013   Dyslipidemia 03/01/2014   Financial difficulties 08/09/2014   GERD (gastroesophageal reflux disease)    Health care maintenance 03/01/2014   Hypertension    Hypertensive urgency 05/29/2017   Hypomagnesemia 05/29/2017   Insomnia 08/29/2013   Morbid obesity (HCC[place]/12/2013   Near syncope 05/29/2017   Odontogenic infection of jaw 05/29/2017   PAC (premature atrial contraction)    PAF (paroxysmal atrial fibrillation) ([place]CC[place]  Pressure injury of skin 09/02/2019   PVCs (premature ventricular contractions)    Right shoulder pain 08/29/2013   Second degree AV block, Mobitz type I    Sepsis ([place]CC[place]/10/2017   Sleep apnea    uses cpap   Review of Systems:   Review of Systems  Constitutional:  Negative for chills and fever.  Cardiovascular:  Negative for chest pain, orthopnea, leg swelling and PND.  Psychiatric/Behavioral:  Positive for depression (improved). Negative for suicidal ideas.     Physical Exam: Vitals:   12/30/20 0847  BP: 127/74  Pulse: (!) 59  Temp: 98.4 F (36.9 C)  TempSrc: Oral  SpO2: 96%  Weight: (!) 314 lb 6.4 oz (142.6 kg)   General: middle age man , attentive in conversation , ambulates, but brought in by wheelchair  HEENT: Conjunctiva nl , antiicteric sclerae, moist mucous membranes,  unable to estimate JVP due to habitus Cardiovascular: Normal rate, regular rhythm.  No murmurs, rubs, or gallops Pulmonary : Equal breath sounds, No wheezes, rales, or rhonchi Abdominal: soft, nontender,  bowel sounds present Ext: Trace edema in ankles, no tenderness to palpation of lower extremities.   Assessment & Plan:   See Encounters Tab for problem based charting.  Patient discussed with Dr. GuiPhilipp Gould

## 2020-12-31 ENCOUNTER — Encounter: Payer: Self-pay | Admitting: Internal Medicine

## 2020-12-31 LAB — BMP8+ANION GAP
Anion Gap: 16 mmol/L (ref 10.0–18.0)
BUN/Creatinine Ratio: 19 (ref 9–20)
BUN: 33 mg/dL — ABNORMAL HIGH (ref 6–24)
CO2: 20 mmol/L (ref 20–29)
Calcium: 9.8 mg/dL (ref 8.7–10.2)
Chloride: 101 mmol/L (ref 96–106)
Creatinine, Ser: 1.7 mg/dL — ABNORMAL HIGH (ref 0.76–1.27)
Glucose: 150 mg/dL — ABNORMAL HIGH (ref 65–99)
Potassium: 5.2 mmol/L (ref 3.5–5.2)
Sodium: 137 mmol/L (ref 134–144)
eGFR: 47 mL/min/{1.73_m2} — ABNORMAL LOW (ref >59)

## 2020-12-31 NOTE — Assessment & Plan Note (Addendum)
HPI: Patient is currently following with Dr. Radford Pax and undergoing evaluation of amyloidosis.  Prerenal AKI noted on 6/23, Cr 2.14.  Lasix held and at follow on 7/8 up he says it was resumed and continued on visit 7/26.  Creatinine back to 1.5 at those visits, which roughly appears to be may be his new baseline ( Cr 1.2-1.6)  Also had some mild hyperkalemia noted on 7/26 on Losartan, says he is currently on 80 mg daily. No orthopnea or PND. Patient euvolemic on exam. Wt 142.6 kg, patient is not weighing himself at home.   Assessment/Plan: HFpEF, PYP scan equivocal for amyloid.  Cardiac MRI, SPEP and UPEP are being rechecked by cardiology, normal in May.  BMP today shows creatinine 1.7. UA shows no proteinuria. - Lasix 40 mg once daily, every other day to maintain dry weight ( estimate 143 kg). Discussed decreasing lasix dose, patient has good supply of 40 mg tabs and prefers every other day option.  - Follow up in 2 weeks for lab only

## 2020-12-31 NOTE — Assessment & Plan Note (Addendum)
HPI: Patient is here for follow up of his depression . PHQ-9 of 26 on prior visit. As mentioned in Dr.Katsadourous last note, patient says he has been depressed for approximately 3 years. He is currently living in a homeless shelter. He reports recently waking up and looking forward to his day. His PHQ-9 score is a 9 today.   Assessment/Plan: Severe depression, improved on Cymbalta , Wellbutrin, and with counseling. In addition he is living at a shelter. Although he had to deal with beg bugs, his new living situation has had a benefit on his depression. Also notes counseling is helping a lot.  - Continue Wellbutrin 75 mg BID - Continue Cymbalta 60 mg daily  - Continue following with Dr.Winstead

## 2021-01-02 ENCOUNTER — Other Ambulatory Visit: Payer: Self-pay

## 2021-01-02 ENCOUNTER — Other Ambulatory Visit: Payer: Self-pay | Admitting: *Deleted

## 2021-01-02 NOTE — Patient Outreach (Addendum)
Medicaid Managed Care   Nurse Care Manager Note  01/02/2021 Name:  Allen Gould MRN:  [id number] DOB:  1966/03/21  Allen Gould is an 55 y.o. year old male who is a primary patient of [name], MD.  The [facility] Managed Care Coordination team was consulted for assistance with:    CHF HTN  [name] was given information about Medicaid Managed Care Coordination team services today. Allen Gould Patient agreed to services and verbal consent obtained.  Engaged with patient by telephone for follow up visit in response to provider referral for case management and/or care coordination services.   Assessments/Interventions:  Review of past medical history, allergies, medications, health status, including review of consultants reports, laboratory and other test data, was performed as part of comprehensive evaluation and provision of chronic care management services.  SDOH (Social Determinants of Health) assessments and interventions performed:   Care Plan  Allergies  Allergen Reactions   Shellfish Allergy Hives    Medications Reviewed Today     Reviewed by Melissa Montane, RN (Registered Nurse) on 01/02/21 at 76  Med List Status: <None>   Medication Order Taking? Sig Documenting Provider Last Dose Status Informant  Accu-Chek Softclix Lancets lancets [id number] No Check blood sugar 3 times a day [name], MD Taking Active Self  amLODipine (NORVASC) 10 MG tablet [id number] No Take 1 tablet (10 mg total) by mouth daily. Madalyn Rob, MD Taking Active Self  apixaban (ELIQUIS) 5 MG TABS tablet [id number] No Take 1 tablet (5 mg total) by mouth 2 (two) times daily. Vickie Epley, MD Taking Active Self  aspirin 81 MG EC tablet [id number] No Take 81 mg by mouth daily. Swallow whole. [provider] Taking Active Self  atorvastatin (LIPITOR) 80 MG tablet [id number] No Take 1 tablet (80 mg total) by mouth daily. [name], MD Taking Active Self  buPROPion  [facility]) 75 MG tablet [id number]  Take 1 tablet (75 mg total) by mouth 2 (two) times daily. Aldine Contes, MD  Active   Dulaglutide (TRULICITY) 1.5 FO/2.7XA SOPN [id number] No Inject 0.65m one time per week for the first four weeks, followed by 1.562mthereafter ChMitzi HansenMD Taking Active   DULoxetine (CYMBALTA) 60 MG capsule [id number] 1 capsule (60 mg total) by mouth daily. NaAldine ContesMD  Active   furosemide (LASIX) 40 MG tablet [id number] Take 1 tablet (40 mg total) by mouth daily.  Patient not taking: Reported on 12/03/2020   ChMitzi HansenMD Not Taking Active   gabapentin (NEURONTIN) 300 MG capsule [id number] Take 1 capsule (300 mg total) by mouth in the morning, at noon, and at bedtime. ChMitzi HansenMD Taking Active   glucose blood (ACCU-CHEK GUIDE) test strip [id number] Check blood sugar 3 times a day LiIona BeardMD Taking Active Self  metFORMIN (GLUCOPHAGE) 500 MG tablet [id number] Take 1 tablet (500 mg total) by mouth 2 (two) times daily with a meal. [name], [name], MD Taking Active   metoprolol succinate (TOPROL XL) 25 MG 24 hr tablet [id number] Take 1 tablet (25 mg total) by mouth at bedtime. TiShirley FriarPA-C Taking Active   pantoprazole (PROTONIX) 40 MG tablet [id number] Take 1 tablet (40 mg total) by mouth daily. ChMitzi HansenMD Taking Active   valsartan (DIOVAN) 80 MG tablet [id number] Take 1 tablet (80 mg total) by mouth daily. ChMitzi HansenMD Taking Active   vitamin B-12 (CYANOCOBALAMIN) 1000 MCG tablet [id number] Take  1,000 mcg by mouth daily. [provider] Taking Active Self  Med List Note Lysle Rubens, CPhT 09/26/19 1059): [facility]            Patient Active Problem List   Diagnosis Date Noted   Normocytic anemia 11/14/2020   Acquired dilation of ascending aorta and aortic root (HCC)    Dilated aortic root (HCC)    Aortic stenosis    Hip pain 2/2 osteoarthritis  06/12/2020   Tachycardia-bradycardia syndrome ([place]) 06/05/2020   (HFpEF) heart failure with preserved ejection fraction ([place]) 06/02/2020   Severe depression ([place]) 04/24/2020   Physical deconditioning 04/24/2020   Second degree AV block, Mobitz type I    Bifascicular block    PAC (premature atrial contraction)    PVCs (premature ventricular contractions)    PAF (paroxysmal atrial fibrillation) ([place])    AKI (acute kidney injury) ([place]) 09/01/2019   Health care maintenance 03/01/2014   Dyslipidemia 03/01/2014   Morbid obesity ([place]) 08/29/2013   Insomnia 08/29/2013   DM (diabetes mellitus), type 2 ([place]) 08/10/2013   Hypertension    Asthma    GERD (gastroesophageal reflux disease)     Conditions to be addressed/monitored per PCP order:  CHF and HTN  Care Plan : Heart Failure (Adult)  Updates made by Melissa Montane, RN since 01/02/2021 12:00 AM     Problem: Symptom Exacerbation (Heart Failure)      Long-Range Goal: Symptom Exacerbation Prevented or Minimized   Start Date: 12/03/2020  Expected End Date: 03/05/2021  Recent Progress: On track  Priority: High  Note:   Current Barriers:  Knowledge deficit related to basic heart failure pathophysiology and self care management Financial strain Transportation Barriers Limited Social Support Does not adhere to prescribed medication regimen Lacks social connections Does not contact provider office for questions/concerns Case Manager Clinical Goal(s):  patient will weigh self daily and record patient will verbalize understanding of Heart Failure Action Plan and when to call doctor patient will take all Heart Failure mediations as prescribed patient will weigh daily and record (notifying MD of 3 lb weight gain over night or 5 lb in a week) Interventions:  Basic overview and discussion of pathophysiology of Heart Failure reviewed  Provided verbal education on low sodium diet Advised patient to weigh each morning after emptying  bladder Discussed importance of daily weight and advised patient to weigh and record daily Reviewed role of diuretics in prevention of fluid overload and management of heart failure Reviewed appointment on 8/16 with MM Pharmacist for medication management Provided phone number to check status of disability application [phone] Provided therapeutic listening Assisted with changing MyChart password Patient Goals/Self-Care Activities - work with Wyatt Portela, [place] for assistance with resources for food, transportation, housing, and financial resources - begin a notebook of services in my neighborhood or community - follow-up on any referrals for help I am given - begin a heart failure diary - bring diary to all appointments - eat more whole grains, fruits and vegetables, lean meats and healthy fats - know when to call the doctor - track symptoms and what helps feel better or worse  - contact provider with questions or concerns - call provider with weight gain of 3# in 24 hours or 5# in a week - take all medications as directed - work with MM Pharmacist, Ovid Curd for medication management Follow Up Plan: Telephone follow up appointment with care management team member scheduled for:02/03/21 @ 9am      Follow Up:  Patient agrees  to Care Plan and Follow-up.  Plan: The Managed Medicaid care management team will reach out to the patient again over the next 30 days.  Date/time of next scheduled RN care management/care coordination outreach:  02/03/21 @ [place] RN, [place] RN Care Coordinator

## 2021-01-02 NOTE — Patient Instructions (Addendum)
Visit Information  Allen Gould was given information about Medicaid Managed Care team care coordination services as a part of their [place] Medicaid benefit. Allen Gould verbally consentedto engagement with the [facility] Managed Care team.   If you are experiencing a medical emergency, please call 911 or report to your local emergency department or urgent care.   If you have a non-emergency medical problem during routine business hours, please contact your provider's office and ask to speak with a nurse.   For questions related to your [facility] [facility] health plan, please call: [phone]  OR visit the member homepage at: [email or web address]  If you would like to schedule transportation through your [facility] plan, please call the following number at least 2 days in advance of your appointment: [phone]  If you are experiencing a behavioral health crisis, call the [place] at [phone]). The line is available 24 hours a day, seven days a week.  If you would like help to quit smoking, call 1-800-QUIT-NOW 250-372-4830) OR Espaol: 1-855-Djelo-Ya (0-677-034-0352) o para ms informacin haga clic aqu or Text READY to 200-400 to register via text  Allen Gould - following are the goals we discussed in your visit today:   Goals Addressed             This Visit's Progress    Find Help in My Community       Timeframe:  Long-Range Goal Priority:  High Start Date:  12/03/20                           Expected End Date:  03/05/21                     Follow Up Date 02/04/21    - call [phone] for status of disability application - work with Wyatt Portela, SW for assistance with resources for food, transportation, housing, and financial resources - begin a notebook of services in my neighborhood or community - follow-up on any  referrals for help I am given    Why is this important?   Knowing how and where to find help for yourself or family in your neighborhood and community is an important skill.  You will want to take some steps to learn how.         Track and Manage Symptoms-Heart Failure       Timeframe:  Long-Range Goal Priority:  High Start Date:    12/03/20                         Expected End Date:  03/05/21                     Follow Up Date 02/04/21    - attend scheduled appointments - begin a heart failure diary - bring diary to all appointments - eat more whole grains, fruits and vegetables, lean meats and healthy fats - know when to call the doctor - track symptoms and what helps feel better or worse  - contact provider with questions or concerns - call provider with weight gain of 3# in 24 hours or 5# in a week - take all medications as directed - work with MM Pharmacist, Ovid Curd for medication management on 01/06/21   Why is this important?   You will be able to handle your symptoms better if you keep  track of them.  Making some simple changes to your lifestyle will help.  Eating healthy is one thing you can do to take good care of yourself.            Please see education materials related to managing stress, Heart failure, and health maintenance provided by MyChart link.  Patient verbalizes understanding of instructions provided today.   Telephone follow up appointment with Managed Medicaid care management team member scheduled for:02/03/21 @ [place] RN, BSN [facility] RN Care Coordinator   Following is a copy of your plan of care:  Patient Care Plan: Heart Failure (Adult)     Problem Identified: Symptom Exacerbation (Heart Failure)      Long-Range Goal: Symptom Exacerbation Prevented or Minimized   Start Date: 12/03/2020  Expected End Date: 03/05/2021  Recent Progress: On track  Priority: High  Note:   Current Barriers:   Knowledge deficit related to basic heart failure pathophysiology and self care management Financial strain Transportation Barriers Limited Social Support Does not adhere to prescribed medication regimen Lacks social connections Does not contact provider office for questions/concerns Case Manager Clinical Goal(s):  patient will weigh self daily and record patient will verbalize understanding of Heart Failure Action Plan and when to call doctor patient will take all Heart Failure mediations as prescribed patient will weigh daily and record (notifying MD of 3 lb weight gain over night or 5 lb in a week) Interventions:  Basic overview and discussion of pathophysiology of Heart Failure reviewed  Provided verbal education on low sodium diet Advised patient to weigh each morning after emptying bladder Discussed importance of daily weight and advised patient to weigh and record daily Reviewed role of diuretics in prevention of fluid overload and management of heart failure Reviewed appointment on 8/16 with MM Pharmacist for medication management Provided phone number to check status of disability application [phone] Provided therapeutic listening Assisted with changing MyChart password Patient Goals/Self-Care Activities - work with Wyatt Portela, [place] for assistance with resources for food, transportation, housing, and financial resources - begin a notebook of services in my neighborhood or community - follow-up on any referrals for help I am given - begin a heart failure diary - bring diary to all appointments - eat more whole grains, fruits and vegetables, lean meats and healthy fats - know when to call the doctor - track symptoms and what helps feel better or worse  - contact provider with questions or concerns - call provider with weight gain of 3# in 24 hours or 5# in a week - take all medications as directed - work with MM Pharmacist, Ovid Curd for medication management Follow Up Plan:  Telephone follow up appointment with care management team member scheduled for:02/03/21 @ 9am

## 2021-01-05 NOTE — Progress Notes (Signed)
Internal Medicine Clinic Attending  Case discussed with Dr. Steen  At the time of the visit.  We reviewed the resident's history and exam and pertinent patient test results.  I agree with the assessment, diagnosis, and plan of care documented in the resident's note.  

## 2021-01-06 ENCOUNTER — Other Ambulatory Visit: Payer: Self-pay

## 2021-01-06 NOTE — Patient Instructions (Signed)
Visit Information  Allen Gould was given information about Medicaid Managed Care team care coordination services as a part of their [place] Medicaid benefit. Allen Gould verbally consentedto engagement with the [facility] Managed Care team.   If you are experiencing a medical emergency, please call 911 or report to your local emergency department or urgent care.   If you have a non-emergency medical problem during routine business hours, please contact your provider's office and ask to speak with a nurse.   For questions related to your [facility] [facility] health plan, please call: [phone]  OR visit the member homepage at: [email or web address]  If you would like to schedule transportation through your [facility] plan, please call the following number at least 2 days in advance of your appointment: [phone]  If you are experiencing a behavioral health crisis, call the [place] at [phone]). The line is available 24 hours a day, seven days a week.  If you would like help to quit smoking, call 1-800-QUIT-NOW 760-537-4064) OR Espaol: 1-855-Djelo-Ya (4-270-623-7628) o para ms informacin haga clic aqu or Text READY to 200-400 to register via text  Allen Gould - following are the goals we discussed in your visit today:   Goals Addressed   None     Please see education materials related to DM provided as print materials.   The patient verbalized understanding of instructions provided today and agreed to receive a mailed copy of patient instruction and/or educational materials.  The Managed Medicaid care management team will reach out to the patient again over the next 60 days.   Allen Gould, Pharm.D., Managed Medicaid Pharmacist [phone]   Following is a copy of your plan of care:  Patient Care Plan: Heart  Failure (Adult)     Problem Identified: Symptom Exacerbation (Heart Failure)      Long-Range Goal: Symptom Exacerbation Prevented or Minimized   Start Date: 12/03/2020  Expected End Date: 03/05/2021  Recent Progress: On track  Priority: High  Note:   Current Barriers:  Knowledge deficit related to basic heart failure pathophysiology and self care management Financial strain Transportation Barriers Limited Social Support Does not adhere to prescribed medication regimen Lacks social connections Does not contact provider office for questions/concerns Case Manager Clinical Goal(s):  patient will weigh self daily and record patient will verbalize understanding of Heart Failure Action Plan and when to call doctor patient will take all Heart Failure mediations as prescribed patient will weigh daily and record (notifying MD of 3 lb weight gain over night or 5 lb in a week) Interventions:  Basic overview and discussion of pathophysiology of Heart Failure reviewed  Provided verbal education on low sodium diet Advised patient to weigh each morning after emptying bladder Discussed importance of daily weight and advised patient to weigh and record daily Reviewed role of diuretics in prevention of fluid overload and management of heart failure Reviewed appointment on 8/16 with MM Pharmacist for medication management Provided phone number to check status of disability application [phone] Provided therapeutic listening Assisted with changing MyChart password Patient Goals/Self-Care Activities - work with Allen Gould, [place] for assistance with resources for food, transportation, housing, and financial resources - begin a notebook of services in my neighborhood or community - follow-up on any referrals for help I am given - begin a heart failure diary - bring diary to all appointments - eat more whole grains, fruits and vegetables, lean meats and healthy fats - know  when to call the doctor -  track symptoms and what helps feel better or worse  - contact provider with questions or concerns - call provider with weight gain of 3# in 24 hours or 5# in a week - take all medications as directed - work with MM Pharmacist, Allen Gould for medication management Follow Up Plan: Telephone follow up appointment with care management team member scheduled for:02/03/21 @ 9am     Patient Care Plan: Medication Management     Problem Identified: Health Promotion or Disease Self-Management (General Plan of Care)      Goal: Medication Management   Note:   Current Barriers:  Unable to independently afford treatment regimen Will call Allen Gould at his Pharmacy and get co-pay to $0 Does not adhere to prescribed medication regimen Does not maintain contact with provider office   Pharmacist Clinical Goal(s):  Over the next 60 days, patient will verbalize ability to afford treatment regimen contact provider office for questions/concerns as evidenced notation of same in electronic health record through collaboration with PharmD and provider.    Interventions: Inter-disciplinary care team collaboration (see longitudinal plan of care) Comprehensive medication review performed; medication list updated in electronic medical record        Patient Goals/Self-Care Activities Over the next 60 days, patient will:  - take medications as prescribed collaborate with provider on medication access solutions  Follow Up Plan: The care management team will reach out to the patient again over the next 90 days.      Task: Mutually Develop and Allen Gould Achievement of Patient Goals   Note:   Care Management Activities:    - verbalization of feelings encouraged    Notes:

## 2021-01-06 NOTE — Patient Outreach (Signed)
Medicaid Managed Care    Pharmacy Note  01/06/2021 Name: Allen Gould MRN: [id number] DOB: 20-Aug-1965  [name] is a 55 y.o. year old male who is a primary care patient of [name], MD. The [facility] Managed Care Coordination team was consulted for assistance with disease management and care coordination needs.    Engaged with patient Engaged with patient by telephone for initial visit in response to referral for case management and/or care coordination services.  Mr. Brawley was given information about Managed Medicaid Care Coordination team services today. [name] agreed to services and verbal consent obtained.   Objective:  Lab Results  Component Value Date   CREATININE 1.70 (H) 12/30/2020   CREATININE 1.51 (H) 12/16/2020   CREATININE 1.59 (H) 11/28/2020    Lab Results  Component Value Date   HGBA1C 5.8 (A) 11/13/2020       Component Value Date/Time   CHOL 92 (L) 04/08/2020 0817   TRIG 87 04/08/2020 0817   HDL 40 04/08/2020 0817   CHOLHDL 2.3 04/08/2020 0817   CHOLHDL 6.0 02/28/2014 1508   VLDL NOT CALC 02/28/2014 1508   LDLCALC 35 04/08/2020 0817   LDLDIRECT 108 (H) 03/01/2014 0409    Other: (TSH, CBC, Vit D, etc.)  Clinical ASCVD: Yes  The ASCVD Risk score Mikey Bussing DC Jr., et al., 2013) failed to calculate for the following reasons:   The valid total cholesterol range is 130 to 320 mg/dL    Other: (CHADS2VASc if Afib, PHQ9 if depression, MMRC or CAT for COPD, ACT, DEXA)  BP Readings from Last 3 Encounters:  12/30/20 127/74  12/16/20 133/87  11/28/20 122/89    Assessment/Interventions: Review of patient past medical history, allergies, medications, health status, including review of consultants reports, laboratory and other test data, was performed as part of comprehensive evaluation and provision of chronic care management services.    HTN Amlodipine 54m Metoprolol Succ 230mQD Valsartan 8084mD -Extensive time spent on counseling  patient on blood pressure goal and impact of each antihypertensive medication on their blood pressure and risk reduction for CV disease.  Used analogies to explain the need for multiple antihypertensive medications to achieve BP goals and that it is often a silent disease with no symptoms.  Plan: At goal,  patient stable/ symptoms controlled   Edema Weight: There were no vitals filed for this visit. Furosemide 40m82m Plan: At goal,  patient stable/ symptoms controlled  AFib Eliquis 5mg 65moprolol Succ 25mg 45mSA 81mg P33m At goal,  patient stable/ symptoms controlled  Lipids Lab Results  Component Value Date   CHOL 92 (L) 04/08/2020   CHOL 246 (H) 02/28/2014   Lab Results  Component Value Date   HDL 40 04/08/2020   HDL 41 02/28/2014   Lab Results  Component Value Date   LDLCALC 35 04/08/2020   LDLCALC NOT CALC 02/28/2014   Lab Results  Component Value Date   TRIG 87 04/08/2020   TRIG 588 (H) 02/28/2014   Lab Results  Component Value Date   CHOLHDL 2.3 04/08/2020   CHOLHDL 6.0 02/28/2014   Lab Results  Component Value Date   LDLDIRECT 108 (H) 03/01/2014   Atorvastatin 80mg Pl40mPatient needs updated lipid labs  DM Lab Results  Component Value Date   HGBA1C 5.8 (A) 11/13/2020   HGBA1C 7.7 (H) 08/12/2020   HGBA1C 6.7 (A) 06/11/2020   Lab Results  Component Value Date   MICROALBUR 103.3 (H) 11/18/2020   LDLCALC 35  04/08/2020   CREATININE 1.70 (H) 12/30/2020    Lab Results  Component Value Date   NA 137 12/30/2020   K 5.2 12/30/2020   CREATININE 1.70 (H) 12/30/2020   GFRNONAA 54 (L) 08/12/2020   GFRAA 86 07/10/2020   GLUCOSE 150 (H) 12/30/2020    Lab Results  Component Value Date   WBC 6.8 11/18/2020   HGB 12.3 (L) 11/18/2020   HCT 36.1 (L) 11/18/2020   MCV 95 11/18/2020   PLT 229 11/18/2020    Dulaglutide 1.86m/week Metformin 5068mBID -Extensive time was spent counseling patient on the pathophysiology and risk for complications with  uncontrolled Diabetes.  Used teach back method for counseling on ADA diabetic diet.  Time spent on explaining role of carbohydrates versus sugars impacting blood glucose levels and meal planning. Current exercise consists of walking 10 minutes three times a week, plan is to increase to 20 minutes 5 days a week and then after 2-3 weeks increase to 30 minute 5 days a week.  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 Depression screen [facility] 12/16/2020 11/28/2020  Decreased Interest 1 3 3   Down, Depressed, Hopeless 1 3 3   PHQ - 2 Score 2 6 6   Altered sleeping 1 3 3   Tired, decreased energy 1 3 3   Change in appetite 1 3 3   Feeling bad or failure about yourself  1 3 3   Trouble concentrating 1 3 3   Moving slowly or fidgety/restless 1 3 3   Suicidal thoughts 1 2 2   PHQ-9 Score 9 26 26   Difficult doing work/chores - Very difficult Very difficult  Some recent data might be hidden   Bupropion 78mg 35mDuloxetine 60mg B30mlan: At goal,  patient stable/ symptoms controlled   SDOH (Social Determinants of Health) assessments and interventions performed:    Care Plan  Allergies  Allergen Reactions   Shellfish Allergy Hives    Medications Reviewed Today     Reviewed by KennedyLane HackerPhAdvanced Surgery Center Of Orlando LLCacist) on 01/06/21 at 1027  Med List Status: <None>   Medication Order Taking? Sig Documenting Provider Last Dose Status Informant  Accu-Chek Softclix Lancets lancets  [id number] blood sugar 3 times a day Liang, Iona Beardking Active Self  amLODipine (NORVASC) 10 MG tablet [id number] 1 tablet (10 mg total) by mouth daily. Steen, Madalyn Robking Active Self  apixaban (ELIQUIS) 5 MG TABS tablet [id number] 1 tablet (5 mg total) by mouth 2 (two) times daily. LambertVickie Epleyking Active Self  aspirin 81 MG EC tablet [id number] 81 mg by mouth daily. Swallow whole. [provider] Taking Active Self  atorvastatin (LIPITOR) 80 MG tablet [id number] 1 tablet (80 mg total) by mouth daily. ChristiMitzi Hansenking Active Self  buPROPion (WELLBULargo Surgery LLC Dba West Bay Surgery Center tablet [id number] 1 tablet (75 mg total) by mouth 2 (two) times daily. NarendrAldine Contesking Active   Dulaglutide (TRULICITY) 1.5  MG/0.5ML SOPN [id number] Yes Inject 0.90m one time per week for the first four weeks, followed by 1.511mthereafter ChMitzi HansenMD Taking Active   DULoxetine (CYMBALTA) 60 MG capsule [id number] Take 1 capsule (60 mg total) by mouth daily. NaAldine ContesMD Taking Active   furosemide (LASIX) 40 MG tablet [id number] Take 1 tablet (40 mg total) by mouth daily.  Patient not taking: Reported on 01/06/2021   ChMitzi HansenMD Not Taking Active   gabapentin (NEURONTIN) 300 MG capsule [id number] Take 1 capsule (300 mg total) by mouth in the morning, at noon, and at bedtime. ChMitzi HansenMD Taking Active   glucose blood (ACCU-CHEK GUIDE) test strip [id number] Check blood sugar 3 times a day LiIona BeardMD Taking Active Self  metFORMIN (GLUCOPHAGE) 500 MG tablet [id number] Take 1 tablet (500 mg total) by mouth 2 (two) times daily with a meal. [name], [name], MD Taking Active   metoprolol succinate (TOPROL XL) 25 MG 24 hr tablet [id number] Take 1 tablet (25 mg total) by mouth at bedtime. TiShirley FriarPA-C Taking Active   pantoprazole (PROTONIX) 40 MG tablet [id number] 1  tablet (40 mg total) by mouth daily. ChMitzi HansenMD  Active   valsartan (DIOVAN) 80 MG tablet [id number] Take 1 tablet (80 mg total) by mouth daily. ChMitzi HansenMD Taking Active   vitamin B-12 (CYANOCOBALAMIN) 1000 MCG tablet [id number] 1,000 mcg by mouth daily. [provider]  Active Self  Med List Note (LLysle RubensCPhT 09/26/19 1059): BrAaron Edelmanenter            Patient Active Problem List   Diagnosis Date Noted   Normocytic anemia 11/14/2020   Acquired dilation of ascending aorta and aortic root (HCC)    Dilated aortic root (HCC)    Aortic stenosis    Hip pain 2/2 osteoarthritis 06/12/2020   Tachycardia-bradycardia syndrome (HCSpenc[place]01/13/2022   (HFpEF) heart failure with preserved ejection fraction (HCCumberland01/02/2021   Severe depression (HCWoodford12/06/2019   Physical deconditioning 04/24/2020   Second degree AV block, Mobitz type I    Bifascicular block    PAC (premature atrial contraction)    PVCs (premature ventricular contractions)    PAF (paroxysmal atrial fibrillation) (HCPax[place]   AKI (acute kidney injury) (HCLa [place]04/02/2020   Health care maintenance 03/01/2014   Dyslipidemia 03/01/2014   Morbid obesity (HCCarthage04/12/2013   Insomnia 08/29/2013   DM (diabetes mellitus), type 2 (HCBenton03/20/2015   Hypertension    Asthma    GERD (gastroesophageal reflux disease)     Conditions to be addressed/monitored: Atrial Fibrillation, HTN, and DM  Care Plan : Medication Management  Updates made by KeLane HackerRPPritchettince 01/06/2021 12:00 AM     Problem: Health Promotion or Disease Self-Management (General Plan of Care)      Goal: Medication Management   Note:   Current Barriers:  Unable to independently afford treatment regimen Will call ViChristy Sartoriust his Pharmacy and get co-pay to $0 Does not adhere to prescribed medication regimen Does not maintain contact with provider office   Pharmacist Clinical Goal(s):  Over the next 60 days, patient  will verbalize ability to afford treatment regimen contact provider office for questions/concerns as evidenced notation of same in electronic health record through collaboration with PharmD and provider.    Interventions: Inter-disciplinary care team collaboration (see longitudinal plan of care) Comprehensive medication review performed; medication list updated in electronic medical record  Patient Goals/Self-Care Activities Over the next 60 days, patient will:  - take medications as prescribed collaborate with provider on medication access solutions  Follow Up Plan: The care management team will reach out to the patient again over the next 90 days.      Task: Mutually Develop and Royce Macadamia Achievement of Patient Goals   Note:   Care Management Activities:    - verbalization of feelings encouraged    Notes:        Medication Assistance:  Patient can't afford meds Plan: Called his Pharmacy, spoke with Victor/Vishal, I asked what his co-pay for his meds are since the patient can't afford them. Victor/Vishal said, "Oh, he can't afford them, we can waive them." Told patient this, all he has to do is contact Christy Sartorius  Follow up: Agree  Plan: The patient has been provided with contact information for the care management team and has been advised to call with any health related questions or concerns.   [place] Constable, Pharm.D., Managed Medicaid Pharmacist - [phone]

## 2021-01-07 ENCOUNTER — Encounter: Payer: Medicaid Other | Admitting: Internal Medicine

## 2021-01-13 ENCOUNTER — Ambulatory Visit: Payer: Self-pay

## 2021-01-14 ENCOUNTER — Other Ambulatory Visit: Payer: Self-pay

## 2021-01-14 ENCOUNTER — Ambulatory Visit: Payer: Medicaid Other | Admitting: Behavioral Health

## 2021-01-14 ENCOUNTER — Encounter: Payer: Self-pay | Admitting: Internal Medicine

## 2021-01-14 ENCOUNTER — Other Ambulatory Visit: Payer: Self-pay | Admitting: *Deleted

## 2021-01-14 ENCOUNTER — Other Ambulatory Visit: Payer: Medicaid Other

## 2021-01-14 ENCOUNTER — Ambulatory Visit (INDEPENDENT_AMBULATORY_CARE_PROVIDER_SITE_OTHER): Payer: Medicaid Other | Admitting: Internal Medicine

## 2021-01-14 VITALS — BP 132/88 | HR 60 | Temp 98.2°F | Wt 314.9 lb

## 2021-01-14 DIAGNOSIS — I5032 Chronic diastolic (congestive) heart failure: Secondary | ICD-10-CM | POA: Diagnosis present

## 2021-01-14 DIAGNOSIS — D1722 Benign lipomatous neoplasm of skin and subcutaneous tissue of left arm: Secondary | ICD-10-CM

## 2021-01-14 DIAGNOSIS — F419 Anxiety disorder, unspecified: Secondary | ICD-10-CM

## 2021-01-14 DIAGNOSIS — F331 Major depressive disorder, recurrent, moderate: Secondary | ICD-10-CM

## 2021-01-14 NOTE — Progress Notes (Signed)
CC: lipoma  HPI:  Allen Gould is a 55 y.o. male with a past medical history stated below and presents today for lipoma. Please see problem based assessment and plan for additional details.  Past Medical History:  Diagnosis Date   Acquired dilation of ascending aorta and aortic root ([place])    51m by echo 09/2020 but normal on Chest CTA 05/2020   Acute kidney injury (HBrayton 09/01/2019   Aortic stenosis    mild with mean AVG 162mg by echo 09/2019   Asthma    Balanitis 08/09/2014   Bifascicular block    Cough 08/09/2014   Diabetes mellitus without complication (HCDeemston   Dilated aortic root (HCMarion   3941my echo 09/2019   DM (diabetes mellitus) type 2, uncontrolled, without ketoacidosis 08/10/2013   Dyslipidemia 03/01/2014   Financial difficulties 08/09/2014   GERD (gastroesophageal reflux disease)    Health care maintenance 03/01/2014   Hypertension    Hypertensive urgency 05/29/2017   Hypomagnesemia 05/29/2017   Insomnia 08/29/2013   Morbid obesity (HCCBeverly Shores/12/2013   Near syncope 05/29/2017   Odontogenic infection of jaw 05/29/2017   PAC (premature atrial contraction)    PAF (paroxysmal atrial fibrillation) (HCC)    Pressure injury of skin 09/02/2019   PVCs (premature ventricular contractions)    Right shoulder pain 08/29/2013   Second degree AV block, Mobitz type I    Sepsis (HCCFordyce/10/2017   Sleep apnea    uses cpap    Current Outpatient Medications on File Prior to Visit  Medication Sig Dispense Refill   Accu-Chek Softclix Lancets lancets Check blood sugar 3 times a day 100 each 12   amLODipine (NORVASC) 10 MG tablet Take 1 tablet (10 mg total) by mouth daily. 90 tablet 3   apixaban (ELIQUIS) 5 MG TABS tablet Take 1 tablet (5 mg total) by mouth 2 (two) times daily. 180 tablet 3   aspirin 81 MG EC tablet Take 81 mg by mouth daily. Swallow whole.     atorvastatin (LIPITOR) 80 MG tablet Take 1 tablet (80 mg total) by mouth daily. 90 tablet 3   buPROPion (WELLBUTRIN) 75 MG tablet Take 1  tablet (75 mg total) by mouth 2 (two) times daily. 30 tablet 3   Dulaglutide (TRULICITY) 1.5 MG/PJ/8.2NKPN Inject 0.79m34me time per week for the first four weeks, followed by 1.5mg 61mreafter 10 mL 1   DULoxetine (CYMBALTA) 60 MG capsule Take 1 capsule (60 mg total) by mouth daily. 30 capsule 2   furosemide (LASIX) 40 MG tablet Take 1 tablet (40 mg total) by mouth daily. (Patient not taking: Reported on 01/06/2021) 90 tablet 1   gabapentin (NEURONTIN) 300 MG capsule Take 1 capsule (300 mg total) by mouth in the morning, at noon, and at bedtime. 270 capsule 1   glucose blood (ACCU-CHEK GUIDE) test strip Check blood sugar 3 times a day 100 each 12   metFORMIN (GLUCOPHAGE) 500 MG tablet Take 1 tablet (500 mg total) by mouth 2 (two) times daily with a meal. 180 tablet 1   metoprolol succinate (TOPROL XL) 25 MG 24 hr tablet Take 1 tablet (25 mg total) by mouth at bedtime. 90 tablet 3   pantoprazole (PROTONIX) 40 MG tablet Take 1 tablet (40 mg total) by mouth daily. 90 tablet 3   valsartan (DIOVAN) 80 MG tablet Take 1 tablet (80 mg total) by mouth daily. 90 tablet 1   vitamin B-12 (CYANOCOBALAMIN) 1000 MCG tablet Take 1,000 mcg by mouth  daily.     No current facility-administered medications on file prior to visit.    Family History  Problem Relation Age of Onset   Kidney disease Mother    Diabetes Sister    Hyperlipidemia Sister    Heart disease Other     Social History   Socioeconomic History   Marital status: Single    Spouse name: Not on file   Number of children: Not on file   Years of education: Not on file   Highest education level: Not on file  Occupational History   Not on file  Tobacco Use   Smoking status: Former    Types: Cigarettes    Quit date: 04/24/2005    Years since quitting: 15.7   Smokeless tobacco: Never  Vaping Use   Vaping Use: Never used  Substance and Sexual Activity   Alcohol use: Not Currently    Alcohol/week: 2.0 standard drinks    Types: 2 Cans of  beer per week    Comment: Last drink 8 months ago   Drug use: No   Sexual activity: Not on file  Other Topics Concern   Not on file  Social History Narrative   Not on file   Social Determinants of Health   Financial Resource Strain: High Risk   Difficulty of Paying Living Expenses: Very hard  Food Insecurity: Food Insecurity Present   Worried About Charity fundraiser in the Last Year: Sometimes true   Arboriculturist in the Last Year: Sometimes true  Transportation Needs: Unmet Transportation Needs   Lack of Transportation (Medical): No   Lack of Transportation (Non-Medical): Yes  Physical Activity: Not on file  Stress: Not on file  Social Connections: Not on file  Intimate Partner Violence: Not on file    Review of Systems: ROS negative except for what is noted on the assessment and plan.  Vitals:   01/14/21 0852  BP: 132/88  Pulse: 60  Temp: 98.2 F (36.8 C)  TempSrc: Oral  SpO2: 98%  Weight: (!) 314 lb 14.4 oz (142.8 kg)    Physical Exam: Gen: A&O x3 and in no apparent distress, well appearing and nourished. Neck: no obvious masses or nodules, AROM intact. CV: RRR, no murmurs, rubs, or gallops. S1/S2 presents  Resp: Clear to ascultation bilaterally  Abd: BS (+) x4, soft, non-tender, without obvious hepatosplenomegaly or masses MSK: Grossly normal AROM and strength x4 extremities. Skin: roughly 2-3 cm lump on left forearm, no surrounding edema, erythema or rash. Minimal TTP Neuro: No focal deficits, grossly normal sensation and coordination.  Psych: Oriented x3 and responding appropriately. Intact recent and remote memory, normal mood, judgement, affect , and insight.    Assessment & Plan:   See Encounters Tab for problem based charting.  Patient discussed with Dr. Lars Mage, D.O. [facility], PGY-3 Pager: [phone], Phone: [phone] Date 01/15/2021 Time 5:46 AM

## 2021-01-14 NOTE — BH Specialist Note (Signed)
Integrated Behavioral Health via Telemedicine Visit  01/14/2021 Allen Gould [id number]  Number of [place] visits: 5/6 Session Start time: 10:00am  Session End time: 10:30am Total time: 30  Referring Provider: Dr. Morrison Old, DO Patient/Family location: Pt is @ the shelter about to call his ride for transport to work [facility] Provider location: [facility] Office All persons participating in visit: Pt & Clinician Types of Service: Telephone visit to ck-in w/Pt's current status  I connected with Allen Gould and/or Allen Gould's  self  via  Animal nutritionist  (Video is Tree surgeon) and verified that I am speaking with the correct person using two identifiers. Discussed confidentiality:  5th visit  I discussed the limitations of telemedicine and the availability of in person appointments.  Discussed there is a possibility of technology failure and discussed alternative modes of communication if that failure occurs.  I discussed that engaging in this telemedicine visit, they consent to the provision of behavioral healthcare and the services will be billed under their insurance.  Patient and/or legal guardian expressed understanding and consented to Telemedicine visit:  5th visit  Presenting Concerns: Patient and/or family reports the following symptoms/concerns: Pt is doing well; he has taken on responsibility @ his [employer] job. 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  Pt sts, "I am doing better emot'ly; I relax some now. I can rest. I eat on time. I have my own freedom, though limited. Work is going good; the [employer] put me in charge. I am saving some money. Things are better."  Duration of problem: months now & improving; Severity of problem: mild  Patient and/or Family's Strengths/Protective Factors: Social connections, Social and Emotional competence, Concrete supports in place (healthy food,  safe environments, etc.), and Sense of purpose; Pt's job is giving him a sense of meaning & connection w/his Boss who trusts him to get things done.  Goals Addressed: Patient will:  Reduce symptoms of: anxiety, depression, and stress   Increase knowledge and/or ability of: coping skills, healthy habits, self-management skills, and stress reduction   Demonstrate ability to: Increase healthy adjustment to current life circumstances; Pt has secured addt'l support resources which are serving him well.  Progress towards Goals: Ongoing  Interventions: Interventions utilized:  Behavioral Activation and Supportive Counseling Standardized Assessments completed:  screeners prn  Patient and/or Family Response: Pt is receptive to call today & requests future ck-in call  Assessment: Patient currently experiencing inc'd sense of self-efficacy & responsibility @ his job. Pt has the basics he needs to forge forward @ this time. He feels empowered & able to encourage others @ the [place].   Patient may benefit from cont'd, intermittent ck-in calls for mental health wellness.  Plan: Follow up with behavioral health clinician on : 2-3 wks for ck-in call Behavioral recommendations: None @ this time Referral(s): [facility] (In Clinic)  I discussed the assessment and treatment plan with the patient and/or parent/guardian. They were provided an opportunity to ask questions and all were answered. They agreed with the plan and demonstrated an understanding of the instructions.   They were advised to call back or seek an in-person evaluation if the symptoms worsen or if the condition fails to improve as anticipated.  Donnetta Hutching, LMFT

## 2021-01-14 NOTE — Patient Instructions (Signed)
Thank you, [name] for allowing [place] to provide your care today. Today we discussed arm lump3.    Labs Ordered:  Lab Orders         BMP8+Anion Gap      Tests Ordered: Orders Placed This Encounter  Procedures   BMP8+Anion Gap      Referrals Ordered:  Referral Orders  No referral(s) requested today     Medication Changes:  There are no discontinued medications.   No orders of the defined types were placed in this encounter.    Health Maintenance Screening: Diabetes Health Maintenance Due  Topic Date Due   HEMOGLOBIN A1C  02/13/2021   LIPID PANEL  04/08/2021   FOOT EXAM  08/04/2021   OPHTHALMOLOGY EXAM  09/03/2021     Instructions:  - If your arm lump rapidly gets bigger, more painful or if pain does not subside, then please call us so  we can schedule a biopsy of the lump.   Follow up:  as needed    Remember: If you have any questions or concerns, call our clinic at [phone] or after hours call [phone] and ask for the internal medicine resident on call.  Marianna Payment, D.O. [place]

## 2021-01-15 ENCOUNTER — Encounter: Payer: Self-pay | Admitting: Internal Medicine

## 2021-01-15 DIAGNOSIS — D179 Benign lipomatous neoplasm, unspecified: Secondary | ICD-10-CM | POA: Insufficient documentation

## 2021-01-15 LAB — BMP8+ANION GAP
Anion Gap: 14 mmol/L (ref 10.0–18.0)
BUN/Creatinine Ratio: 22 — ABNORMAL HIGH (ref 9–20)
BUN: 37 mg/dL — ABNORMAL HIGH (ref 6–24)
CO2: 22 mmol/L (ref 20–29)
Calcium: 9.3 mg/dL (ref 8.7–10.2)
Chloride: 103 mmol/L (ref 96–106)
Creatinine, Ser: 1.72 mg/dL — ABNORMAL HIGH (ref 0.76–1.27)
Glucose: 164 mg/dL — ABNORMAL HIGH (ref 65–99)
Potassium: 4.9 mmol/L (ref 3.5–5.2)
Sodium: 139 mmol/L (ref 134–144)
eGFR: 46 mL/min/{1.73_m2} — ABNORMAL LOW (ref >59)

## 2021-01-15 NOTE — Progress Notes (Signed)
BMP results from your previous visit.

## 2021-01-15 NOTE — Assessment & Plan Note (Signed)
HPI: Patient presents with a 2-3 cm soft tissue lump on his left forearm.  He says that he just recently noticed this lump.  He has minimal tenderness to palpation with no surrounding erythema edema or rash.  The masses freely mobile without any high risk features.   Assessment/Plan: -Counseled the patient regarding changes such as rapid increase in size or becoming more painful will require a biopsy and possible surgical removal. -Otherwise we will continue to monitor the lump for changes.

## 2021-01-16 NOTE — Progress Notes (Signed)
Remote pacemaker transmission.   

## 2021-01-20 ENCOUNTER — Other Ambulatory Visit: Payer: Self-pay | Admitting: Internal Medicine

## 2021-01-20 ENCOUNTER — Ambulatory Visit: Payer: Medicaid Other | Admitting: Licensed Clinical Social Worker

## 2021-01-20 DIAGNOSIS — Z794 Long term (current) use of insulin: Secondary | ICD-10-CM

## 2021-01-20 DIAGNOSIS — E119 Type 2 diabetes mellitus without complications: Secondary | ICD-10-CM

## 2021-01-20 NOTE — Patient Instructions (Addendum)
Visit Information Instructions: patient will work with SW to address concerns related to community resources.  Patient was given the following information about care management and care coordination services today, agreed to services, and gave verbal consent: 1.care management/care coordination services include personalized support from designated clinical staff supervised by their physician, including individualized plan of care and coordination with other care providers 2. 24/7 contact phone numbers for assistance for urgent and routine care needs. 3. The patient may stop care management/care coordination services at any time by phone call to the office staff.  Patient verbalizes understanding of instructions provided today and agrees to view in [place].   The care management team will reach out to the patient again over the next 30 days.   [name], [place]  Social Worker [facility] Care Management  [phone]

## 2021-01-20 NOTE — Chronic Care Management (AMB) (Signed)
  Care Management   Social Work Visit Note  01/20/2021 Name: Allen Gould MRN: [id number] DOB: 06-09-1965  Allen Gould is a 55 y.o. year old male who sees Allen Hansen, MD for primary care. The care management team was consulted for assistance with care management and care coordination needs related to [facility] Resources    Patient was given the following information about care management and care coordination services today, agreed to services, and gave verbal consent: 1.care management/care coordination services include personalized support from designated clinical staff supervised by their physician, including individualized plan of care and coordination with other care providers 2. 24/7 contact phone numbers for assistance for urgent and routine care needs. 3. The patient may stop care management/care coordination services at any time by phone call to the office staff.  Engaged with patient by telephone for follow up visit in response to provider referral for social work chronic care management and care coordination services.  Assessment: Review of patient history, allergies, and health status during evaluation of patient need for care management/care coordination services.    Interventions:  Patient interviewed and appropriate assessments performed Collaborated with clinical team regarding patient needs  Patient reports being in a safer environment at [facility]. Patient reported a positive change in living conditions and is happy with change. Patient stated he has an attorney working with his disability claim. Patient reported Allen Gould is assisting with [facility].  Patient stated he enjoys when SW calls and checks in on him. Patient requested future calls by SW.  Patient stated he had food and no additional resources were needed at this time.       Plan:  SW will follow up in 30-days.   Allen Gould, Allen Gould  Social Worker [facility] Care Management   [phone]

## 2021-01-22 ENCOUNTER — Other Ambulatory Visit: Payer: Self-pay

## 2021-01-22 ENCOUNTER — Other Ambulatory Visit: Payer: Medicaid Other

## 2021-01-27 LAB — PROTEIN ELECTROPHORESIS, URINE REFLEX
Albumin ELP, Urine: 52.7 %
Alpha-1-Globulin, U: 2.8 %
Alpha-2-Globulin, U: 7.3 %
Beta Globulin, U: 23.5 %
Gamma Globulin, U: 13.7 %
Protein, Ur: 34.3 mg/dL

## 2021-01-27 LAB — PROTEIN ELECTROPHORESIS, SERUM
A/G Ratio: 1 (ref 0.7–1.7)
Albumin ELP: 3.7 g/dL (ref 2.9–4.4)
Alpha 1: 0.2 g/dL (ref 0.0–0.4)
Alpha 2: 1 g/dL (ref 0.4–1.0)
Beta: 1.1 g/dL (ref 0.7–1.3)
Gamma Globulin: 1.4 g/dL (ref 0.4–1.8)
Globulin, Total: 3.7 g/dL (ref 2.2–3.9)
Total Protein: 7.4 g/dL (ref 6.0–8.5)

## 2021-01-29 ENCOUNTER — Other Ambulatory Visit (HOSPITAL_COMMUNITY): Payer: Self-pay | Admitting: Emergency Medicine

## 2021-01-29 DIAGNOSIS — Z01812 Encounter for preprocedural laboratory examination: Secondary | ICD-10-CM

## 2021-01-29 NOTE — Progress Notes (Signed)
CBC ordered for HCT value needed for CMR ECV calculations  Marchia Bond RN Navigator Cardiac Imaging [facility] Heart and Vascular Services [phone] Office  [phone] Cell

## 2021-01-30 ENCOUNTER — Telehealth (HOSPITAL_COMMUNITY): Payer: Self-pay | Admitting: Emergency Medicine

## 2021-01-30 ENCOUNTER — Other Ambulatory Visit: Payer: Self-pay

## 2021-01-30 ENCOUNTER — Ambulatory Visit (INDEPENDENT_AMBULATORY_CARE_PROVIDER_SITE_OTHER): Payer: Medicaid Other | Admitting: Cardiology

## 2021-01-30 ENCOUNTER — Encounter: Payer: Self-pay | Admitting: Cardiology

## 2021-01-30 VITALS — BP 144/94 | HR 60 | Ht 70.0 in | Wt 309.4 lb

## 2021-01-30 DIAGNOSIS — I480 Paroxysmal atrial fibrillation: Secondary | ICD-10-CM

## 2021-01-30 DIAGNOSIS — I119 Hypertensive heart disease without heart failure: Secondary | ICD-10-CM

## 2021-01-30 DIAGNOSIS — I10 Essential (primary) hypertension: Secondary | ICD-10-CM

## 2021-01-30 DIAGNOSIS — R7989 Other specified abnormal findings of blood chemistry: Secondary | ICD-10-CM

## 2021-01-30 DIAGNOSIS — I050 Rheumatic mitral stenosis: Secondary | ICD-10-CM | POA: Insufficient documentation

## 2021-01-30 DIAGNOSIS — I2510 Atherosclerotic heart disease of native coronary artery without angina pectoris: Secondary | ICD-10-CM

## 2021-01-30 DIAGNOSIS — I452 Bifascicular block: Secondary | ICD-10-CM

## 2021-01-30 DIAGNOSIS — I350 Nonrheumatic aortic (valve) stenosis: Secondary | ICD-10-CM

## 2021-01-30 DIAGNOSIS — E7800 Pure hypercholesterolemia, unspecified: Secondary | ICD-10-CM

## 2021-01-30 DIAGNOSIS — I77810 Thoracic aortic ectasia: Secondary | ICD-10-CM

## 2021-01-30 DIAGNOSIS — G4733 Obstructive sleep apnea (adult) (pediatric): Secondary | ICD-10-CM

## 2021-01-30 LAB — BASIC METABOLIC PANEL
BUN/Creatinine Ratio: 19 (ref 9–20)
BUN: 28 mg/dL — ABNORMAL HIGH (ref 6–24)
CO2: 19 mmol/L — ABNORMAL LOW (ref 20–29)
Calcium: 10.2 mg/dL (ref 8.7–10.2)
Chloride: 104 mmol/L (ref 96–106)
Creatinine, Ser: 1.44 mg/dL — ABNORMAL HIGH (ref 0.76–1.27)
Glucose: 115 mg/dL — ABNORMAL HIGH (ref 65–99)
Potassium: 4.9 mmol/L (ref 3.5–5.2)
Sodium: 139 mmol/L (ref 134–144)
eGFR: 57 mL/min/{1.73_m2} — ABNORMAL LOW (ref >59)

## 2021-01-30 MED ORDER — AMLODIPINE BESYLATE 10 MG PO TABS: 10.0000 mg | ORAL_TABLET | Freq: Every day | ORAL | 3 refills | Status: DC

## 2021-01-30 MED ORDER — METOPROLOL SUCCINATE ER 25 MG PO TB24: 25.0000 mg | ORAL_TABLET | Freq: Every day | ORAL | 3 refills | Status: DC

## 2021-01-30 MED ORDER — APIXABAN 5 MG PO TABS: 5.0000 mg | ORAL_TABLET | Freq: Two times a day (BID) | ORAL | 3 refills | Status: AC

## 2021-01-30 NOTE — Patient Instructions (Signed)
Medication Instructions:   Your physician recommends that you continue on your current medications as directed. Please refer to the Current Medication list given to you today.  *If you need a refill on your cardiac medications before your next appointment, please call your pharmacy*   Lab Work:  TODAY--BMET  If you have labs (blood work) drawn today and your tests are completely normal, you will receive your results only by: [place] (if you have MyChart) OR A paper copy in the mail If you have any lab test that is abnormal or we need to change your treatment, we will call you to review the results.   Follow-Up: At [facility], you and your health needs are our priority.  As part of our continuing mission to provide you with exceptional heart care, we have created designated Provider Care Teams.  These Care Teams include your primary Cardiologist (physician) and Advanced Practice Providers (APPs -  Physician Assistants and Nurse Practitioners) who all work together to provide you with the care you need, when you need it.  We recommend signing up for the patient portal called "MyChart".  Sign up information is provided on this After Visit Summary.  MyChart is used to connect with patients for Virtual Visits (Telemedicine).  Patients are able to view lab/test results, encounter notes, upcoming appointments, etc.  Non-urgent messages can be sent to your provider as well.   To learn more about what you can do with MyChart, go to [email or web address].    Your next appointment:   1 year(s)  The format for your next appointment:   In Person  Provider:   Fransico Him, MD

## 2021-01-30 NOTE — Progress Notes (Signed)
Cardiology Office Note:    Date:  01/30/2021   ID:  [name], DOB [birth year], MRN [id number]  PCP:  [name], MD  Cardiologist:  Fransico Him, MD   Electrophysiologist:  Vickie Epley, MD   Referring MD: [name], MD   Chief Complaint:  Coronary Artery Disease, Hypertension, Atrial Fibrillation, Hyperlipidemia, Sleep Apnea, and Aortic Stenosis    Patient Profile:    Allen Gould is a 55 y.o. male with:  Coronary artery disease Mild to mod non-obs Dz by cath at [facility] in 2018  Paroxysmal atrial fibrillation CHA2DS2-VASc=3 (HTN, Diab, CAD)  admx in 11/2018 at [facility] w scrotal abscess >> AF w RVR On apixaban RBBB, LAFB High grade AVB s/p PPM followed in device clinic 1st degree AVB Alcohol abuse Morbid obesity Hypertension  Diabetes mellitus  Macrocytic anemia   Prior CV studies: Echocardiogram 09/29/2020 EF 50-55%, severe LVH, mild MS mean MVG 34mHg, dilated aortic root  and ascending aorta 458m Echocardiogram 10/11/2019 EF 60-65, no RWMA, moderate LVH, normal RVSF, mild LAE, severe calcification of anterior mitral valve leaflet, trivial MR, mild MS, mild AS (mean gradient 12 mmHg), borderline dilatation of aortic root (39 mm)  Echocardiogram 11/30/2018 ([facility]) EF 60-65, BAE, mod LVH  Echocardiogram 07/01/2018 ([facility]) EF 60-65  Echocardiogram 05/30/2017 Moderate concentric LVH, EF 55-60, normal wall motion, GR 1 DD, aortic root 39 mm, mild MR  Cardiac catheterization 10/27/16 ([facility]) Report states:  "mild to mod non-obstructive dz"  Echocardiogram 10/25/16 ([facility]) EF 55-60, mod LVH, Gr 1 DD, mild LAE   History of Present Illness:    [name] initially evaluated in 4/21 during an admission for a UTI.  He was noted to have 2nd degree AVB Type I.  There was no indication for pacemaker.  He had not been adherent with anticoagulation for hx of PAF.  It was decided to not resume anticoagulation due to hx of ETOH abuse and non-adherence which can result in  increased bleeding risk but then later was started on ELiquis 51m108mID.  Hie last echo showed severe LVH and amyloid study was equivocal and SPEP/UPEP were normal.  He has a cMRI coming.   He is here today for followup and is doing well.  He denies any exertional chest pain or pressure, SOB, DOE, PND, orthopnea, LE edema, palpitations or syncope. He occasionally has some sharp CP associated with GERD.  He also has problems with tinnitus and vertigo.  He is compliant with his meds and is tolerating meds with no SE.     Past Medical History:  Diagnosis Date   [place] of [place] and [place] ([place]  70m28m echo 09/2020 but normal on Chest CTA 05/2020   Acute kidney injury (HCC)[place]/02/2020   Asthma    Balanitis 08/09/2014   Bifascicular block    Cough 08/09/2014   Diabetes mellitus without complication (HCC)[place] Dilated aortic root (HCC)[place] 70mm69mecho 09/2020   DM (diabetes mellitus) type 2, uncontrolled, without ketoacidosis 08/10/2013   Dyslipidemia 03/01/2014   Financial difficulties 08/09/2014   GERD (gastroesophageal reflux disease)    Health care maintenance 03/01/2014   Hypertension    Hypomagnesemia 05/29/2017   Insomnia 08/29/2013   Mitral stenosis    mean MVG 3mmHg70mMorbid obesity (HCC) 0[place]8/2015   Near syncope 05/29/2017   Odontogenic infection of jaw 05/29/2017   PAC (premature atrial contraction)    PAF (paroxysmal atrial fibrillation) (  [place])    Pressure injury of skin 09/02/2019   PVCs (premature ventricular contractions)    Right shoulder pain 08/29/2013   Second degree AV block, Mobitz type I    Sepsis ([place]) 05/29/2017   Sleep apnea    uses cpap    Current Medications: Current Meds  Medication Sig   Accu-Chek Softclix Lancets lancets Check blood sugar 3 times a day   amLODipine (NORVASC) 10 MG tablet Take 1 tablet (10 mg total) by mouth daily.   apixaban (ELIQUIS) 5 MG TABS tablet Take 1 tablet (5 mg total) by mouth 2 (two) times daily.    aspirin 81 MG EC tablet Take 81 mg by mouth daily. Swallow whole.   atorvastatin (LIPITOR) 80 MG tablet Take 1 tablet (80 mg total) by mouth daily.   buPROPion (WELLBUTRIN) 75 MG tablet Take 1 tablet (75 mg total) by mouth 2 (two) times daily.   clotrimazole-betamethasone (LOTRISONE) cream Apply 1 application topically 2 (two) times daily.   Dulaglutide (TRULICITY) 1.5 YF/7.4BS SOPN INJECT ONE SYRINGE (1.5 MG) UNDER THE SKIN EVERY WEEK   DULoxetine (CYMBALTA) 60 MG capsule Take 1 capsule (60 mg total) by mouth daily.   furosemide (LASIX) 40 MG tablet Take 40 mg by mouth every other day.   gabapentin (NEURONTIN) 300 MG capsule Take 1 capsule (300 mg total) by mouth in the morning, at noon, and at bedtime.   glucose blood (ACCU-CHEK GUIDE) test strip Check blood sugar 3 times a day   metFORMIN (GLUCOPHAGE) 500 MG tablet Take 1 tablet (500 mg total) by mouth 2 (two) times daily with a meal.   metoprolol succinate (TOPROL XL) 25 MG 24 hr tablet Take 1 tablet (25 mg total) by mouth at bedtime.   pantoprazole (PROTONIX) 40 MG tablet Take 1 tablet (40 mg total) by mouth daily.   valsartan (DIOVAN) 80 MG tablet Take 1 tablet (80 mg total) by mouth daily.   vitamin B-12 (CYANOCOBALAMIN) 1000 MCG tablet Take 1,000 mcg by mouth daily.   [DISCONTINUED] clotrimazole-betamethasone (LOTRISONE) cream Apply topically 2 (two) times daily.     Allergies:   Shellfish allergy   Social History   Tobacco Use   Smoking status: Former    Types: Cigarettes    Quit date: 04/24/2005    Years since quitting: 15.7   Smokeless tobacco: Never  Vaping Use   Vaping Use: Never used  Substance Use Topics   Alcohol use: Not Currently    Alcohol/week: 2.0 standard drinks    Types: 2 Cans of beer per week    Comment: Last drink 8 months ago   Drug use: No     Family Hx: The patient's family history includes Diabetes in his sister; Heart disease in an other family member; Hyperlipidemia in his sister; Kidney disease in  his mother.  ROS   EKGs/Labs/Other Test Reviewed:    EKG:  EKG is not ordered today.   Recent Labs: 06/02/2020: B Natriuretic Peptide 93.9 08/12/2020: ALT 19 11/18/2020: Hemoglobin 12.3; Platelets 229 01/14/2021: BUN 37; Creatinine, Ser 1.72; Potassium 4.9; Sodium 139   Recent Lipid Panel Lab Results  Component Value Date/Time   CHOL 92 (L) 04/08/2020 08:17 AM   TRIG 87 04/08/2020 08:17 AM   HDL 40 04/08/2020 08:17 AM   CHOLHDL 2.3 04/08/2020 08:17 AM   CHOLHDL 6.0 02/28/2014 03:08 PM   LDLCALC 35 04/08/2020 08:17 AM   LDLDIRECT 108 (H) 03/01/2014 04:09 AM    Physical Exam:    VS:  BP [name])  144/94   Pulse 60   Ht 5' 10"  (1.778 m)   Wt (!) 309 lb 6.4 oz (140.3 kg)   SpO2 96%   BMI 44.39 kg/m     Wt Readings from Last 3 Encounters:  01/30/21 (!) 309 lb 6.4 oz (140.3 kg)  01/14/21 (!) 314 lb 14.4 oz (142.8 kg)  12/30/20 (!) 314 lb 6.4 oz (142.6 kg)    GEN: Well nourished, well developed in no acute distress HEENT: Normal NECK: No JVD; No carotid bruits LYMPHATICS: No lymphadenopathy CARDIAC:RRR, no murmurs, rubs, gallops RESPIRATORY:  Clear to auscultation without rales, wheezing or rhonchi  ABDOMEN: Soft, non-tender, non-distended MUSCULOSKELETAL:  No edema; No deformity  SKIN: Warm and dry NEUROLOGIC:  Alert and oriented x 3 PSYCHIATRIC:  Normal affect    ASSESSMENT & PLAN:    1. Bifascicular block/Second degree AV block, Mobitz type I -s/p PPM by Dr. Quentin Ore 06/24/2020 -followed in device clinic  2. Coronary artery disease involving native coronary artery of native heart without angina pectoris -he has a hx of nonobstructive disease by cardiac catheterization in 2018.   -he denies any anginal symptoms -Continue aspirin, statin.  3. PAF (paroxysmal atrial fibrillation) ([place]) -he is maintaining NSR on exam today with no palpitations   -CHA2DS2-VASc Score = 4  This indicates a 4.8% annual risk of stroke. -Continue prescription drug management with Apixaban 49m  BID and Toprol XL 247mdaily>refilled  -continue Apixaban 29m52mID>>he denies any bleeding problems on the DOAC --I have personally reviewed and interpreted outside labs performed by patient's PCP showed SCr 1.72, K+ 4.9 and Hbg 11.7 in June and Aug 2022   4. Essential hypertension -BP is borderline controlled on exam today>he says it is normal at home -Continue prescription drug management with Valsartan 71m28mily, Toprol XL 229mg66mly and Amlodipine 10mg 229my>refilled   5.  HLD -LDL goal < 70 -LDL was 35 in Nov 2021 -Continue prescription drug management with Atorvastatin 71mg d56m   6.  OSA -he uses CPAP nightly  7.  Aortic stenosis -none by echo 09/2020   8.  Mildly dilated aortic root/ascending aorta -42mm by50mo 09/2020  9.  Severe LVH -likely related to HTN -PYP study equivocal and SPEP/UPEP normal -he has cMRI scheduled for next week  -repeat Dispo: 1 year  Medication Adjustments/Labs and Tests Ordered: Current medicines are reviewed at length with the patient today.  Concerns regarding medicines are outlined above.  Tests Ordered: No orders of the defined types were placed in this encounter.  Medication Changes: No orders of the defined types were placed in this encounter.   Signed, Alexiz Cothran TuFransico Him9/2022 8:49 AM    [facility] [phone]

## 2021-01-30 NOTE — Telephone Encounter (Signed)
Reaching out to patient to offer assistance regarding upcoming cardiac imaging study; pt verbalizes understanding of appt date/time, parking situation and where to check in, and verified current allergies; name and call back number provided for further questions should they arise Marchia Bond RN Navigator Cardiac Imaging [facility] [phone] office [phone] cell  Pt asked to arrive 1h early for CMR appt for PPM programming to make it MRI safe.  Denies iv issues Denies claustro Denies other implants other than PPM

## 2021-02-03 ENCOUNTER — Other Ambulatory Visit: Payer: Self-pay

## 2021-02-03 ENCOUNTER — Ambulatory Visit (HOSPITAL_COMMUNITY)
Admission: RE | Admit: 2021-02-03 | Discharge: 2021-02-03 | Disposition: A | Discharge status: Home / Self Care | Payer: Medicare Other | Source: Ambulatory Visit | Attending: Cardiology | Admitting: Cardiology

## 2021-02-03 DIAGNOSIS — I517 Cardiomegaly: Secondary | ICD-10-CM | POA: Insufficient documentation

## 2021-02-03 DIAGNOSIS — I5032 Chronic diastolic (congestive) heart failure: Secondary | ICD-10-CM | POA: Diagnosis not present

## 2021-02-03 MED ORDER — GADOBUTROL 1 MMOL/ML IV SOLN
10.0000 mL | Freq: Once | INTRAVENOUS | Status: AC | PRN
  Administered 2021-02-03: 10 mL via INTRAVENOUS

## 2021-02-03 NOTE — Progress Notes (Signed)
Per order, changed device settings for MRI to   DOO at 85 bpm  Will program device back to pre-MRI settings after completion of exam, and send transmission

## 2021-02-04 ENCOUNTER — Other Ambulatory Visit: Payer: Self-pay | Admitting: *Deleted

## 2021-02-04 NOTE — Patient Outreach (Signed)
Medicaid Managed Care   Nurse Care Manager Note  02/04/2021 Name:  Allen Gould MRN:  [id number] DOB:  Mar 11, 1966  Allen Gould is an 55 y.o. year old male who is a primary patient of [name], MD.  The [facility] Managed Care Coordination team was consulted for assistance with:    CHF  Mr. Radke was given information about Medicaid Managed Care Coordination team services today. [facility] Patient agreed to services and verbal consent obtained.  Engaged with patient by telephone for follow up visit in response to provider referral for case management and/or care coordination services.   Assessments/Interventions:  Review of past medical history, allergies, medications, health status, including review of consultants reports, laboratory and other test data, was performed as part of comprehensive evaluation and provision of chronic care management services.  SDOH (Social Determinants of Health) assessments and interventions performed:   Care Plan  Allergies  Allergen Reactions   Shellfish Allergy Hives    Medications Reviewed Today     Reviewed by Melissa Montane, RN (Registered Nurse) on 02/04/21 at [phone]  Med List Status: <None>   Medication Order Taking? Sig Documenting Provider Last Dose Status Informant  Accu-Chek Softclix Lancets lancets [id number] Yes Check blood sugar 3 times a day [name], MD Taking Active Self  amLODipine (NORVASC) 10 MG tablet [id number] Yes Take 1 tablet (10 mg total) by mouth daily. [name], MD Taking Active   apixaban (ELIQUIS) 5 MG TABS tablet [id number] Yes Take 1 tablet (5 mg total) by mouth 2 (two) times daily. [name], MD Taking Active   aspirin 81 MG EC tablet [id number] Yes Take 81 mg by mouth daily. Swallow whole. [provider] Taking Active Self  atorvastatin (LIPITOR) 80 MG tablet [id number] Yes Take 1 tablet (80 mg total) by mouth daily. [name], MD Taking Active Self  buPROPion  [facility]) 75 MG tablet [id number] Yes Take 1 tablet (75 mg total) by mouth 2 (two) times daily. Aldine Contes, MD Taking Active   clotrimazole-betamethasone Donalynn Furlong) cream [id number] Yes Apply 1 application topically 2 (two) times daily. [provider] Taking Active   Dulaglutide (TRULICITY) 1.5 BE/6.7JQ SOPN [id number] Yes INJECT ONE SYRINGE (1.5 MG) UNDER THE SKIN EVERY WEEK [name], [name], MD Taking Active   DULoxetine (CYMBALTA) 60 MG capsule [id number] Yes Take 1 capsule (60 mg total) by mouth daily. Aldine Contes, MD Taking Active   furosemide (LASIX) 40 MG tablet [id number] Yes Take 40 mg by mouth every other day. [provider] Taking Active   gabapentin (NEURONTIN) 300 MG capsule [id number] Yes Take 1 capsule (300 mg total) by mouth in the morning, at noon, and at bedtime. [name], MD Taking Active   glucose blood (ACCU-CHEK GUIDE) test strip [id number] Yes Check blood sugar 3 times a day [name], MD Taking Active Self  metFORMIN (GLUCOPHAGE) 500 MG tablet [id number] Yes Take 1 tablet (500 mg total) by mouth 2 (two) times daily with a meal. [name], [name], MD Taking Active   metoprolol succinate (TOPROL XL) 25 MG 24 hr tablet [id number] Yes Take 1 tablet (25 mg total) by mouth at bedtime. [name], MD Taking Active   pantoprazole (PROTONIX) 40 MG tablet [id number] Yes Take 1 tablet (40 mg total) by mouth daily. [name], MD Taking Active   valsartan (DIOVAN) 80 MG tablet [id number] Yes Take 1 tablet (80 mg total) by mouth daily. [name], MD Taking Active   vitamin B-12 (CYANOCOBALAMIN) 1000 MCG  tablet [id number] Yes Take 1,000 mcg by mouth daily. [provider] Taking Active Self  Med List Note Lysle Rubens, CPhT 09/26/19 1059): [facility]            Patient Active Problem List   Diagnosis Date Noted   Mitral stenosis 01/30/2021   Lipoma 01/15/2021   Normocytic anemia 11/14/2020   Acquired  dilation of ascending aorta and aortic root (HCC)    Dilated aortic root (HCC)    Aortic stenosis    Hip pain 2/2 osteoarthritis 06/12/2020   Tachycardia-bradycardia syndrome ([place]) 06/05/2020   (HFpEF) heart failure with preserved ejection fraction ([place]) 06/02/2020   Severe depression ([place]) 04/24/2020   Physical deconditioning 04/24/2020   Second degree AV block, Mobitz type I    Bifascicular block    PAC (premature atrial contraction)    PVCs (premature ventricular contractions)    PAF (paroxysmal atrial fibrillation) ([place])    AKI (acute kidney injury) ([place]) 09/01/2019   Health care maintenance 03/01/2014   Dyslipidemia 03/01/2014   Morbid obesity ([place]) 08/29/2013   Insomnia 08/29/2013   DM (diabetes mellitus), type 2 ([place]) 08/10/2013   Hypertension    Asthma    GERD (gastroesophageal reflux disease)     Conditions to be addressed/monitored per PCP order:  CHF  Care Plan : Heart Failure (Adult)  Updates made by Melissa Montane, RN since 02/04/2021 12:00 AM     Problem: Symptom Exacerbation (Heart Failure)      Long-Range Goal: Symptom Exacerbation Prevented or Minimized   Start Date: 12/03/2020  Expected End Date: 03/10/2021  Recent Progress: On track  Priority: High  Note:   Current Barriers:  Knowledge deficit related to basic heart failure pathophysiology and self care management Financial strain-working on disability, applied to [employer] Social Support Does not adhere to prescribed medication regimen Lacks social connections Does not contact provider office for questions/concerns Case Manager Clinical Goal(s):  patient will weigh self daily and record patient will verbalize understanding of Heart Failure Action Plan and when to call doctor patient will take all Heart Failure mediations as prescribed patient will weigh daily and record (notifying MD of 3 lb weight gain over night or 5 lb in a  week) Interventions:  Basic overview and discussion of pathophysiology of Heart Failure reviewed  Provided verbal education on low sodium diet Advised patient to weigh each morning after emptying bladder Discussed importance of daily weight and advised patient to weigh and record daily Reviewed role of diuretics in prevention of fluid overload and management of heart failure Reviewed upcoming appointments  Provided therapeutic listening Reviewed medications and discussed the importance of taking metformin with a meal to avoid stomach upset RNCM will continue to follow, patient appreciates the interaction Patient Goals/Self-Care Activities - call [phone] for status of disability application/follow up with your Attorney for updates - work with Wyatt Portela, [place] for assistance with resources for food, transportation, housing, and financial resources - begin a notebook of services in my neighborhood or community - follow-up on any referrals for help I am given - begin a heart failure diary - bring diary to all appointments - eat more whole grains, fruits and vegetables, lean meats and healthy fats - know when to call the doctor - track symptoms and what helps feel better or worse  - contact provider with questions or concerns - call provider with weight gain of 3# in 24 hours or 5# in a week - take all medications as  directed - work with [facility] Pharmacist for medication management on 02/24/21 Follow Up Plan: Telephone follow up appointment with care management team member scheduled for:03/10/21 @ 10:30am      Follow Up:  Patient agrees to Care Plan and Follow-up.  Plan: The Managed Medicaid care management team will reach out to the patient again over the next 30 days.  Date/time of next scheduled RN care management/care coordination outreach:  03/10/21 @ 10:30am  [name] RN, BSN [place] RN Care Coordinator

## 2021-02-04 NOTE — Patient Instructions (Signed)
Visit Information  Allen Gould was given information about Medicaid Managed Care team care coordination services as a part of their [place] Medicaid benefit. Allen Gould verbally consentedto engagement with the [facility] Managed Care team.   If you are experiencing a medical emergency, please call 911 or report to your local emergency department or urgent care.   If you have a non-emergency medical problem during routine business hours, please contact your provider's office and ask to speak with a nurse.   For questions related to your [facility] health plan, please call: [phone]  OR visit the member homepage at: [email or web address]  If you would like to schedule transportation through your [facility] plan, please call the following number at least 2 days in advance of your appointment: [phone]  If you are experiencing a behavioral health crisis, call the [place] at [phone]). The line is available 24 hours a day, seven days a week.  If you would like help to quit smoking, call 1-800-QUIT-NOW (934)431-4435) OR Espaol: 1-855-Djelo-Ya (1-601-093-2355) o para ms informacin haga clic aqu or Text READY to 200-400 to register via text  Allen Gould - following are the goals we discussed in your visit today:   Goals Addressed             This Visit's Progress    Find Help in My Community       Timeframe:  Long-Range Goal Priority:  High Start Date:  12/03/20                           Expected End Date:  03/10/21                     Follow Up Date 03/10/21    - call [phone] for status of disability application/follow up with your Attorney for updates - work with Wyatt Portela, SW for assistance with resources for food, transportation, housing, and financial resources - begin a notebook of services in my  neighborhood or community - follow-up on any referrals for help I am given    Why is this important?   Knowing how and where to find help for yourself or family in your neighborhood and community is an important skill.  You will want to take some steps to learn how.         Track and Manage Symptoms-Heart Failure       Timeframe:  Long-Range Goal Priority:  High Start Date:    12/03/20                         Expected End Date:  03/10/21                     Follow Up Date 03/10/21    - attend scheduled appointments - begin a heart failure diary - bring diary to all appointments - eat more whole grains, fruits and vegetables, lean meats and healthy fats - know when to call the doctor - track symptoms and what helps feel better or worse  - contact provider with questions or concerns - call provider with weight gain of 3# in 24 hours or 5# in a week - take all medications as directed - work with MM Pharmacist for medication management 02/24/21   Why is this important?   You will be able to handle your symptoms  better if you keep track of them.  Making some simple changes to your lifestyle will help.  Eating healthy is one thing you can do to take good care of yourself.            Please see education materials related to CHF provided by MyChart link.  Patient has access to MyChart and can view provided education  Telephone follow up appointment with Managed Medicaid care management team member scheduled for:03/10/21 @ 10:30am  [name] RN, BSN [facility] RN Care Coordinator   Following is a copy of your plan of care:  Patient Care Plan: Heart Failure (Adult)     Problem Identified: Symptom Exacerbation (Heart Failure)      Long-Range Goal: Symptom Exacerbation Prevented or Minimized   Start Date: 12/03/2020  Expected End Date: 03/10/2021  Recent Progress: On track  Priority: High  Note:   Current Barriers:  Knowledge deficit  related to basic heart failure pathophysiology and self care management Financial strain-working on disability, applied to [employer] Social Support Does not adhere to prescribed medication regimen Lacks social connections Does not contact provider office for questions/concerns Case Manager Clinical Goal(s):  patient will weigh self daily and record patient will verbalize understanding of Heart Failure Action Plan and when to call doctor patient will take all Heart Failure mediations as prescribed patient will weigh daily and record (notifying MD of 3 lb weight gain over night or 5 lb in a week) Interventions:  Basic overview and discussion of pathophysiology of Heart Failure reviewed  Provided verbal education on low sodium diet Advised patient to weigh each morning after emptying bladder Discussed importance of daily weight and advised patient to weigh and record daily Reviewed role of diuretics in prevention of fluid overload and management of heart failure Reviewed upcoming appointments  Provided therapeutic listening Reviewed medications and discussed the importance of taking metformin with a meal to avoid stomach upset RNCM will continue to follow, patient appreciates the interaction Patient Goals/Self-Care Activities - call [phone] for status of disability application/follow up with your Attorney for updates - work with Wyatt Portela, [place] for assistance with resources for food, transportation, housing, and financial resources - begin a notebook of services in my neighborhood or community - follow-up on any referrals for help I am given - begin a heart failure diary - bring diary to all appointments - eat more whole grains, fruits and vegetables, lean meats and healthy fats - know when to call the doctor - track symptoms and what helps feel better or worse  - contact provider with questions or concerns - call provider with weight  gain of 3# in 24 hours or 5# in a week - take all medications as directed - work with MM Pharmacist for medication management on 02/24/21 Follow Up Plan: Telephone follow up appointment with care management team member scheduled for:03/10/21 @ 10:30am     Patient Care Plan: Medication Management     Problem Identified: Health Promotion or Disease Self-Management (General Plan of Care)      Goal: Medication Management   Note:   Current Barriers:  Unable to independently afford treatment regimen Will call Christy Sartorius at his Pharmacy and get co-pay to $0 Does not adhere to prescribed medication regimen Does not maintain contact with provider office   Pharmacist Clinical Goal(s):  Over the next 60 days, patient will verbalize ability to afford treatment regimen contact provider office for questions/concerns as evidenced notation of  same in electronic health record through collaboration with PharmD and provider.    Interventions: Inter-disciplinary care team collaboration (see longitudinal plan of care) Comprehensive medication review performed; medication list updated in electronic medical record   Patient Goals/Self-Care Activities Over the next 60 days, patient will:  - take medications as prescribed collaborate with provider on medication access solutions  Follow Up Plan: The care management team will reach out to the patient again over the next 90 days.

## 2021-02-09 ENCOUNTER — Ambulatory Visit: Payer: Medicaid Other | Admitting: Behavioral Health

## 2021-02-09 ENCOUNTER — Encounter: Payer: Self-pay | Admitting: Cardiology

## 2021-02-09 NOTE — Progress Notes (Signed)
Electrophysiology Office Follow up Visit Note:    Date:  02/10/2021   ID:  Allen Gould, DOB Jun 28, 1965, MRN [id number]  PCP:  [name], MD  [facility] Cardiologist:  Fransico Him, MD  [facility] Electrophysiologist:  Vickie Epley, MD    Interval History:    Allen Gould is a 55 y.o. male who presents for a follow up visit after permanent pacemaker implant on June 24, 2020.  On subsequent device interrogations, lead parameters have been stable.  Since the implant he had an amyloid nuclear test which was equivocal for ATTR amyloidosis.  He is also had a cardiac MRI which showed patchy late gadolinium enhancement within the LV comprising 3.5% of the total myocardium.  The findings were not consistent with a diagnosis of cardiac amyloidosis.  The MRI was felt to represent hypertrophic cardiomyopathy.  Today he tells me he is doing well.  No problems with his pacemaker.   Past Medical History:  Diagnosis Date   Acquired dilation of ascending aorta and aortic root ([place])    50m by echo 09/2020 but normal on Chest CTA 05/2020   [place] 09/01/2019   Asthma    Balanitis 08/09/2014   Bifascicular block    Cough 08/09/2014   Diabetes mellitus without complication ([place]    Dilated aortic root (HCC)    455mon echo 09/2020   DM (diabetes mellitus) type 2, uncontrolled, without ketoacidosis 08/10/2013   Dyslipidemia 03/01/2014   Financial difficulties 08/09/2014   GERD (gastroesophageal reflux disease)    Health care maintenance 03/01/2014   HOCM (hypertrophic obstructive cardiomyopathy) ([place]   Hypertension    Hypomagnesemia 05/29/2017   Insomnia 08/29/2013   Mitral stenosis    mean MVG 9m81m   Morbid obesity ([place]/12/2013   Near syncope 05/29/2017   Odontogenic infection of jaw 05/29/2017   PAC (premature atrial contraction)    PAF (paroxysmal atrial fibrillation) (HCC)    Pressure injury of skin 09/02/2019   PVCs (premature  ventricular contractions)    Right shoulder pain 08/29/2013   Second degree AV block, Mobitz type I    Sepsis ([place]/10/2017   Sleep apnea    uses cpap    Past Surgical History:  Procedure Laterality Date   APPENDECTOMY     MASS EXCISION N/A 08/19/2020   Procedure: EXCISION OF SCROTAL CYSTS;  Surgeon: PacRobley FriesD;  Location: [facility];  Service: Urology;  Laterality: N/A;  1 HR   PACEMAKER IMPLANT N/A 06/24/2020   Procedure: PACEMAKER IMPLANT;  Surgeon: LamVickie EpleyD;  Location: [facility];  Service: Cardiovascular;  Laterality: N/A;    Current Medications: Current Meds  Medication Sig   Accu-Chek Softclix Lancets lancets Check blood sugar 3 times a day   amLODipine (NORVASC) 10 MG tablet Take 1 tablet (10 mg total) by mouth daily.   apixaban (ELIQUIS) 5 MG TABS tablet Take 1 tablet (5 mg total) by mouth 2 (two) times daily.   aspirin 81 MG EC tablet Take 81 mg by mouth daily. Swallow whole.   atorvastatin (LIPITOR) 80 MG tablet Take 1 tablet (80 mg total) by mouth daily.   buPROPion (WELLBUTRIN) 75 MG tablet Take 1 tablet (75 mg total) by mouth 2 (two) times daily.   clotrimazole-betamethasone (LOTRISONE) cream Apply 1 application topically 2 (two) times daily.   Dulaglutide (TRULICITY) 1.5 MG/NL/8.9QJPN INJECT ONE SYRINGE (1.5 MG) UNDER THE SKIN EVERY WEEK   DULoxetine (CYMBALTA) 60  MG capsule Take 1 capsule (60 mg total) by mouth daily.   furosemide (LASIX) 40 MG tablet Take 40 mg by mouth every other day.   gabapentin (NEURONTIN) 300 MG capsule Take 1 capsule (300 mg total) by mouth in the morning, at noon, and at bedtime.   glucose blood (ACCU-CHEK GUIDE) test strip Check blood sugar 3 times a day   metFORMIN (GLUCOPHAGE) 500 MG tablet Take 1 tablet (500 mg total) by mouth 2 (two) times daily with a meal.   metoprolol succinate (TOPROL XL) 25 MG 24 hr tablet Take 1 tablet (25 mg total) by mouth at bedtime.   pantoprazole (PROTONIX) 40 MG tablet Take 1  tablet (40 mg total) by mouth daily.   valsartan (DIOVAN) 80 MG tablet Take 1 tablet (80 mg total) by mouth daily.   vitamin B-12 (CYANOCOBALAMIN) 1000 MCG tablet Take 1,000 mcg by mouth daily.     Allergies:   Shellfish allergy   Social History   Socioeconomic History   Marital status: Single    Spouse name: Not on file   Number of children: Not on file   Years of education: Not on file   Highest education level: Not on file  Occupational History   Not on file  Tobacco Use   Smoking status: Former    Types: Cigarettes    Quit date: 04/24/2005    Years since quitting: 15.8   Smokeless tobacco: Never  Vaping Use   Vaping Use: Never used  Substance and Sexual Activity   Alcohol use: Not Currently    Alcohol/week: 2.0 standard drinks    Types: 2 Cans of beer per week    Comment: Last drink 8 months ago   Drug use: No   Sexual activity: Not on file  Other Topics Concern   Not on file  Social History Narrative   Not on file   Social Determinants of Health   Financial Resource Strain: High Risk   Difficulty of Paying Living Expenses: Very hard  Food Insecurity: Food Insecurity Present   Worried About Charity fundraiser in the Last Year: Sometimes true   Arboriculturist in the Last Year: Sometimes true  Transportation Needs: Unmet Transportation Needs   Lack of Transportation (Medical): No   Lack of Transportation (Non-Medical): Yes  Physical Activity: Not on file  Stress: Not on file  Social Connections: Not on file     Family History: The patient's family history includes Diabetes in his sister; Heart disease in an other family member; Hyperlipidemia in his sister; Kidney disease in his mother.  ROS:   Please see the history of present illness.    All other systems reviewed and are negative.  EKGs/Labs/Other Studies Reviewed:    The following studies were reviewed today:  February 03, 2021 cardiac MRI IMPRESSION: Normal Native T1 signal, ability to null  the myocardium, normal atrial size and LGE pattern are not consistent with cardiac amyloidosis.   In lieu of systemic disease that would cause hypertrophy, study consistent with hypertrophic cardiomyopathy. Ejection fraction 59%   December 16, 2020 nuclear amyloid By semi-quantitative assessment, there is Grade 1 uptake (less than rib) The heart-to-contralateral lung ratio was 1.1 at 1 hour and 1.2 at 3 hours Overall, the study is equivocal for ATTR amyloidosis  February 10, 2021 in clinic device interrogation 9 years battery longevity Presenting rhythm AV sequential pacing Lead parameters are stable    EKG:  The ekg ordered today demonstrates a paced,  V pace  Recent Labs: 06/02/2020: B Natriuretic Peptide 93.9 08/12/2020: ALT 19 11/18/2020: Hemoglobin 12.3; Platelets 229 01/30/2021: BUN 28; Creatinine, Ser 1.44; Potassium 4.9; Sodium 139  Recent Lipid Panel    Component Value Date/Time   CHOL 92 (L) 04/08/2020 0817   TRIG 87 04/08/2020 0817   HDL 40 04/08/2020 0817   CHOLHDL 2.3 04/08/2020 0817   CHOLHDL 6.0 02/28/2014 1508   VLDL NOT CALC 02/28/2014 1508   LDLCALC 35 04/08/2020 0817   LDLDIRECT 108 (H) 03/01/2014 0409    Physical Exam:    VS:  BP 132/80   Pulse 60   Ht 5' 10"  (1.778 m)   Wt (!) 314 lb (142.4 kg)   SpO2 95%   BMI 45.05 kg/m     Wt Readings from Last 3 Encounters:  02/10/21 (!) 314 lb (142.4 kg)  01/30/21 (!) 309 lb 6.4 oz (140.3 kg)  01/14/21 (!) 314 lb 14.4 oz (142.8 kg)     GEN:  Well nourished, well developed in no acute distress.  Obese HEENT: Normal NECK: No JVD; No carotid bruits LYMPHATICS: No lymphadenopathy CARDIAC: RRR, no murmurs, rubs, gallops.  Pacemaker pocket well-healed. RESPIRATORY:  Clear to auscultation without rales, wheezing or rhonchi  ABDOMEN: Soft, non-tender, non-distended MUSCULOSKELETAL:  No edema; No deformity  SKIN: Warm and dry NEUROLOGIC:  Alert and oriented x 3 PSYCHIATRIC:  Normal affect   ASSESSMENT:     1. Bifascicular block   2. Cardiac pacemaker in situ   3. PAF (paroxysmal atrial fibrillation) ([place])   4. Hypertrophic cardiomyopathy ([place])    PLAN:    In order of problems listed above:   1. Cardiac pacemaker in situ Device functioning well.  9 years of longevity remaining.  He is being followed remotely.  2. Bifascicular block See #1  3. PAF (paroxysmal atrial fibrillation) ([place]) Very low burden.  Continue Eliquis.  4. Hypertrophic cardiomyopathy ([place]) Very low burden of LGE on his cardiac MRI.  No syncopal history.  Continue metoprolol.   Follow-up 1 year or sooner as needed.   Medication Adjustments/Labs and Tests Ordered: Current medicines are reviewed at length with the patient today.  Concerns regarding medicines are outlined above.  Orders Placed This Encounter  Procedures   EKG 12-Lead    No orders of the defined types were placed in this encounter.    Signed, Lars Mage, MD, [facility] 02/10/2021 8:05 AM    Electrophysiology [facility]

## 2021-02-10 ENCOUNTER — Other Ambulatory Visit: Payer: Self-pay

## 2021-02-10 ENCOUNTER — Ambulatory Visit (INDEPENDENT_AMBULATORY_CARE_PROVIDER_SITE_OTHER): Payer: Medicaid Other | Admitting: Cardiology

## 2021-02-10 ENCOUNTER — Encounter: Payer: Self-pay | Admitting: Cardiology

## 2021-02-10 VITALS — BP 132/80 | HR 60 | Ht 70.0 in | Wt 314.0 lb

## 2021-02-10 DIAGNOSIS — I422 Other hypertrophic cardiomyopathy: Secondary | ICD-10-CM | POA: Diagnosis not present

## 2021-02-10 DIAGNOSIS — Z950 Presence of cardiac pacemaker: Secondary | ICD-10-CM

## 2021-02-10 DIAGNOSIS — I480 Paroxysmal atrial fibrillation: Secondary | ICD-10-CM

## 2021-02-10 DIAGNOSIS — I452 Bifascicular block: Secondary | ICD-10-CM

## 2021-02-10 NOTE — Patient Instructions (Signed)
Medication Instructions:  Your physician recommends that you continue on your current medications as directed. Please refer to the Current Medication list given to you today. *If you need a refill on your cardiac medications before your next appointment, please call your pharmacy*  Lab Work: None ordered. If you have labs (blood work) drawn today and your tests are completely normal, you will receive your results only by: [place] (if you have MyChart) OR A paper copy in the mail If you have any lab test that is abnormal or we need to change your treatment, we will call you to review the results.  Testing/Procedures: None ordered.  Follow-Up: At [facility], you and your health needs are our priority.  As part of our continuing mission to provide you with exceptional heart care, we have created designated Provider Care Teams.  These Care Teams include your primary Cardiologist (physician) and Advanced Practice Providers (APPs -  Physician Assistants and Nurse Practitioners) who all work together to provide you with the care you need, when you need it.  Your next appointment:   Your physician wants you to follow-up in: one year  You may see Vickie Epley, MD or one of the following Advanced Practice Providers on your designated Care Team:   Tommye Standard, [place] Legrand Como "Jonni Sanger" [place], [place] You will receive a reminder letter in the mail two months in advance. If you don't receive a letter, please call our office to schedule the follow-up appointment.  Remote monitoring is used to monitor your Pacemaker from home. This monitoring reduces the number of office visits required to check your device to one time per year. It allows [place] to keep an eye on the functioning of your device to ensure it is working properly. You are scheduled for a device check from home on 03/24/2021. You may send your transmission at any time that day. If you have a wireless device, the transmission will be sent  automatically. After your physician reviews your transmission, you will receive a postcard with your next transmission date.

## 2021-02-19 ENCOUNTER — Telehealth: Payer: Self-pay | Admitting: Licensed Clinical Social Worker

## 2021-02-19 ENCOUNTER — Telehealth: Payer: Medicaid Other | Admitting: Licensed Clinical Social Worker

## 2021-02-19 NOTE — Telephone Encounter (Signed)
  Care Management   Follow Up Note   02/19/2021 Name: Allen Gould MRN: [id number] DOB: Feb 28, 1966   Referred by: [name], MD Reason for referral : No chief complaint on file.   An unsuccessful telephone outreach was attempted today. The patient was referred to the case management team for assistance with care management and care coordination.   Follow Up Plan: The care management team will reach out to the patient again over the next 30 days.   [name], [place]  Social Worker [facility] Care Management  [phone]

## 2021-02-24 ENCOUNTER — Ambulatory Visit: Payer: Self-pay

## 2021-02-25 ENCOUNTER — Other Ambulatory Visit: Payer: Self-pay | Admitting: Internal Medicine

## 2021-02-26 ENCOUNTER — Ambulatory Visit: Payer: Medicaid Other | Admitting: Behavioral Health

## 2021-02-26 NOTE — Telephone Encounter (Signed)
Next appt scheduled 03/19/21 with PCP.

## 2021-03-02 ENCOUNTER — Ambulatory Visit: Payer: Medicaid Other | Admitting: Behavioral Health

## 2021-03-02 DIAGNOSIS — B888 Other specified infestations: Secondary | ICD-10-CM

## 2021-03-02 DIAGNOSIS — F331 Major depressive disorder, recurrent, moderate: Secondary | ICD-10-CM

## 2021-03-02 DIAGNOSIS — F419 Anxiety disorder, unspecified: Secondary | ICD-10-CM

## 2021-03-02 NOTE — BH Specialist Note (Signed)
Integrated Behavioral Health via Telemedicine Visit  03/02/2021 Allen Gould [id number]  Number of [place] visits: 6/6 Session Start time: 2:00pm  Session End time: 2:30pm Total time: 30  Referring Provider: Dr. Morrison Old, DO Patient/Family location: Pt @ [facility] dealing w/multiple Bed Bug episodes [facility] Provider location: [facility] Office All persons participating in visit: Pt & Clinician Types of Service: Individual psychotherapy  I connected with Allen Gould and/or Allen Gould's  self  via  Animal nutritionist  (Video is Tree surgeon) and verified that I am speaking with the correct person using two identifiers. Discussed confidentiality:  6th visit  I discussed the limitations of telemedicine and the availability of in person appointments.  Discussed there is a possibility of technology failure and discussed alternative modes of communication if that failure occurs.  I discussed that engaging in this telemedicine visit, they consent to the provision of behavioral healthcare and the services will be billed under their insurance.  Patient and/or legal guardian expressed understanding and consented to Telemedicine visit:  6th visit  Presenting Concerns: Patient and/or family reports the following symptoms/concerns: elevated anx/dep due to homelessness situation & the cont'd bouts w/bed bugs Duration of problem: months; Severity of problem: moderate  Patient and/or Family's Strengths/Protective Factors: Concrete supports in place (healthy food, safe environments, etc.) and Pt is rec'g job training through [employer] who have supplied him w/an Interview suit to wear for positive impression-making  Goals Addressed: Patient will:  Reduce symptoms of: anxiety, depression, and stress due to situation @ [employer] knowledge and/or ability of: coping skills, healthy habits, and stress reduction    Demonstrate ability to: Increase healthy adjustment to current life circumstances, Increase adequate support systems for patient/family, and resources to deal w/bed bugs  Progress towards Goals: Ongoing  Interventions: Interventions utilized:  Behavioral Activation and Supportive Counseling Standardized Assessments completed:  screeners prn  Patient and/or Family Response: Pt receptive to call & Clinician to f/u  Assessment: Patient currently experiencing elevated anxiety due to a 3rd or 4th round of dealing w/bed bugs @ [employer]. Pt is frustrated & impatient w/this continual round of fighting off bed bugs. Mgmt has recently told him they feel he is the route of travel for the bed bugs.This is upsetting to him.   Patient may benefit from cont'd psychotherapy ck-in appts to secure mental health wellness.  Plan: Follow up with behavioral health clinician on : 2-3 wks on telehealth for 30 min Behavioral recommendations: put in place extra measures for protection from bed bugs as directed Referral(s): [place] (In Clinic)  I discussed the assessment and treatment plan with the patient and/or parent/guardian. They were provided an opportunity to ask questions and all were answered. They agreed with the plan and demonstrated an understanding of the instructions.   They were advised to call back or seek an in-person evaluation if the symptoms worsen or if the condition fails to improve as anticipated.  Allen Hutching, LMFT

## 2021-03-03 ENCOUNTER — Telehealth: Payer: Self-pay | Admitting: Behavioral Health

## 2021-03-03 ENCOUNTER — Ambulatory Visit: Payer: Medicaid Other | Admitting: Behavioral Health

## 2021-03-03 NOTE — Telephone Encounter (Signed)
RC to Pt @ Pt request re: issue addressed in Monday's telehealth visit. Pt is living @ [employer] & is c/o bed bug issue for the 3rd or 4th time. Pt feels blamed by Mgmt for the issue. He sts other Residents are also c/o bed bugs, but he is being blamed for the recent outbreak.  F/U call to [place] on Mon, Oct 10th to address issue. Mgmt Team Member stated they have fllwd the Protocol per [place]. They have used heat Tx, foam Tx-done three times a/o present day. They have done a final walk-through inspection & replaced all bedding, sheets, & other upholstery items that were infested throughout the Facility. They have also heated Ind Resident's clothes to prevent further infestation. Suggested after consultation w/[facility] Nsg Staff they implement a way to allow Pt to ziploc his clothes upon entry & shower to prevent/contain infestation. Mgmt Team Member took suggestion under advisement.  Today, Pt feels he is still, "being blamed" & "they are lying about him". Pt insinuated he might "get thrown out" if he talked about it bc he was @ the limit of his patience. Directed Pt to control his temper & have reasonable conversation w/Mgmt to work situation out to [employer] protection & good. Call then disconnected.  Dr. Theodis Shove

## 2021-03-10 ENCOUNTER — Other Ambulatory Visit: Payer: Self-pay | Admitting: *Deleted

## 2021-03-10 ENCOUNTER — Other Ambulatory Visit: Payer: Self-pay

## 2021-03-10 ENCOUNTER — Ambulatory Visit: Payer: Self-pay

## 2021-03-10 NOTE — Patient Outreach (Signed)
Medicaid Managed Care   Nurse Care Manager Note  03/10/2021 Name:  Allen Gould MRN:  [id number] DOB:  07/09/1965  Allen Gould is an 55 y.o. year old male who is a primary patient of Allen Hansen, MD.  The [facility] Managed Care Coordination team was consulted for assistance with:    CHF HTN DMII  [name] was given information about Medicaid Managed Care Coordination team services today. Allen Gould Patient agreed to services and verbal consent obtained.  Engaged with patient by telephone for follow up visit in response to provider referral for case management and/or care coordination services.   Assessments/Interventions:  Review of past medical history, allergies, medications, health status, including review of consultants reports, laboratory and other test data, was performed as part of comprehensive evaluation and provision of chronic care management services.  SDOH (Social Determinants of Health) assessments and interventions performed: SDOH Interventions    Flowsheet Row Most Recent Value  SDOH Interventions   Stress Interventions Intervention Not Indicated       Care Plan  Allergies  Allergen Reactions   Shellfish Allergy Hives    Medications Reviewed Today     Reviewed by Allen Montane, RN (Registered Nurse) on 03/10/21 at 1039  Med List Status: <None>   Medication Order Taking? Sig Documenting Provider Last Dose Status Informant  Accu-Chek Softclix Lancets lancets [id number] Yes Check blood sugar 3 times a day Allen Beard, MD Taking Active Self  amLODipine (NORVASC) 10 MG tablet [id number] Yes Take 1 tablet (10 mg total) by mouth daily. Allen Margarita, MD Taking Active   apixaban (ELIQUIS) 5 MG TABS tablet [id number] Yes Take 1 tablet (5 mg total) by mouth 2 (two) times daily. Allen Margarita, MD Taking Active   aspirin 81 MG EC tablet [id number] Yes Take 81 mg by mouth daily. Swallow whole. [provider] Taking Active Self   atorvastatin (LIPITOR) 80 MG tablet [id number] Yes Take 1 tablet (80 mg total) by mouth daily. Allen Hansen, MD Taking Active Self  buPROPion [facility]) 75 MG tablet [id number] Yes Take 1 tablet (75 mg total) by mouth 2 (two) times daily. Aldine Contes, MD Taking Active   clotrimazole-betamethasone Donalynn Furlong) cream [id number] Yes Apply 1 application topically 2 (two) times daily. [provider] Taking Active   Dulaglutide (TRULICITY) 1.5 ZP/9.1TA SOPN [id number] Yes INJECT ONE SYRINGE (1.5 MG) UNDER THE SKIN EVERY WEEK Allen, [name], MD Taking Active   DULoxetine (CYMBALTA) 60 MG capsule [id number] Yes TAKE 1 CAPSULE (60 MG TOTAL) BY MOUTH DAILY. Allen Hansen, MD Taking Active   furosemide (LASIX) 40 MG tablet [id number] Yes Take 40 mg by mouth every other day. [provider] Taking Active   gabapentin (NEURONTIN) 300 MG capsule [id number] Yes Take 1 capsule (300 mg total) by mouth in the morning, at noon, and at bedtime. Allen Hansen, MD Taking Active   glucose blood (ACCU-CHEK GUIDE) test strip [id number] Yes Check blood sugar 3 times a day Allen Beard, MD Taking Active Self  metFORMIN (GLUCOPHAGE) 500 MG tablet [id number] Yes Take 1 tablet (500 mg total) by mouth 2 (two) times daily with a meal. Allen, [name], MD Taking Active   metoprolol succinate (TOPROL XL) 25 MG 24 hr tablet [id number] Yes Take 1 tablet (25 mg total) by mouth at bedtime. Allen Margarita, MD Taking Active   pantoprazole (PROTONIX) 40 MG tablet [id number] Yes Take 1 tablet (40 mg total) by mouth daily. Allen Hansen, MD Taking Active   valsartan (DIOVAN)  80 MG tablet [id number] Yes Take 1 tablet (80 mg total) by mouth daily. Allen Hansen, MD Taking Active   vitamin B-12 (CYANOCOBALAMIN) 1000 MCG tablet [id number] Yes Take 1,000 mcg by mouth daily. [provider] Taking Active Self  Med List Note Allen Gould, CPhT 09/26/19 1059): [facility]             Patient Active Problem List   Diagnosis Date Noted   Mitral stenosis 01/30/2021   Lipoma 01/15/2021   Normocytic anemia 11/14/2020   Acquired dilation of ascending aorta and aortic root (HCC)    Dilated aortic root (HCC)    Aortic stenosis    Hip pain 2/2 osteoarthritis 06/12/2020   Tachycardia-bradycardia syndrome ([place]) 06/05/2020   (HFpEF) heart failure with preserved ejection fraction ([place]) 06/02/2020   Severe depression ([place]) 04/24/2020   Physical deconditioning 04/24/2020   Second degree AV block, Mobitz type I    Bifascicular block    PAC (premature atrial contraction)    PVCs (premature ventricular contractions)    PAF (paroxysmal atrial fibrillation) ([place])    AKI (acute kidney injury) ([place]) 09/01/2019   Health care maintenance 03/01/2014   Dyslipidemia 03/01/2014   Morbid obesity ([place]) 08/29/2013   Insomnia 08/29/2013   DM (diabetes mellitus), type 2 ([place]) 08/10/2013   Hypertension    Asthma    GERD (gastroesophageal reflux disease)     Conditions to be addressed/monitored per PCP order:  CHF, HTN, and DMII  Care Plan : Heart Failure (Adult)  Updates made by Allen Montane, RN since 03/10/2021 12:00 AM     Problem: Symptom Exacerbation (Heart Failure)      Long-Range Goal: Symptom Exacerbation Prevented or Minimized   Start Date: 12/03/2020  Expected End Date: 05/11/2021  Recent Progress: On track  Priority: High  Note:   Current Barriers:  Knowledge deficit related to basic heart failure pathophysiology and self care management Financial strain-working on disability, applied to [employer] Social Support Does not adhere to prescribed medication regimen Lacks social connections Does not contact provider office for questions/concerns Case Manager Clinical Goal(s):  patient will weigh self daily and record patient will verbalize understanding of Heart Failure Action Plan and when to call doctor patient  will take all Heart Failure mediations as prescribed patient will weigh daily and record (notifying MD of 3 lb weight gain over night or 5 lb in a week) Interventions:  Basic overview and discussion of pathophysiology of Heart Failure reviewed  Provided verbal education on low sodium diet Advised patient to weigh each morning after emptying bladder Discussed importance of daily weight and advised patient to weigh and record daily Reviewed role of diuretics in prevention of fluid overload and management of heart failure Reviewed upcoming appointments  Provided therapeutic listening, discussed StepUp program, seeking employment, encouragement provided Reviewed medications and discussed patient taking as directed, having difficulty affording medications, patient scheduled with [facility] Pharmacist today RNCM will continue to follow, patient appreciates the interaction Patient Goals/Self-Care Activities - call [phone] for status of disability application/follow up with your Attorney for updates - work with Wyatt Portela, [place] for assistance with resources for food, transportation, housing, and financial resources - begin a notebook of services in my neighborhood or community - follow-up on any referrals for help I am given - begin a heart failure diary - bring diary to all appointments - eat more whole grains, fruits and vegetables, lean meats and healthy fats - know when to call the doctor -  track symptoms and what helps feel better or worse  - contact provider with questions or concerns - call provider with weight gain of 3# in 24 hours or 5# in a week - take all medications as directed - work with [facility] Pharmacist for medication management on 03/10/21 Follow Up Plan: Telephone follow up appointment with care management team member scheduled for:05/11/21 @ 10:30am      Follow Up:  Patient agrees to Care Plan and Follow-up.  Plan: The Managed Medicaid care management team will reach out to the  patient again over the next 60 days.  Date/time of next scheduled RN care management/care coordination outreach:  05/11/21 @ 10:30am  [name] RN, BSN [facility] RN Care Coordinator

## 2021-03-10 NOTE — Patient Instructions (Signed)
Visit Information  Allen Gould was given information about Medicaid Managed Care team care coordination services as a part of their [place] Medicaid benefit. [name] verbally consentedto engagement with the [facility] Managed Care team.   If you are experiencing a medical emergency, please call 911 or report to your local emergency department or urgent care.   If you have a non-emergency medical problem during routine business hours, please contact your provider's office and ask to speak with a nurse.   For questions related to your [facility] [facility] health plan, please call: [phone]  OR visit the member homepage at: [email or web address]  If you would like to schedule transportation through your [facility] plan, please call the following number at least 2 days in advance of your appointment: [phone]  If you are experiencing a behavioral health crisis, call the [place] at [phone]). The line is available 24 hours a day, seven days a week.  If you would like help to quit smoking, call 1-800-QUIT-NOW 757-353-9498) OR Espaol: 1-855-Djelo-Ya (5-697-948-0165) o para ms informacin haga clic aqu or Text READY to 200-400 to register via text  Mr. Docken - following are the goals we discussed in your visit today:   Goals Addressed             This Visit's Progress    Find Help in My Community       Timeframe:  Long-Range Goal Priority:  High Start Date:  12/03/20                           Expected End Date:  05/11/21                     Follow Up Date 05/11/21    - call [phone] for status of disability application/follow up with your Attorney for updates - work with Wyatt Portela, SW for assistance with resources for food, transportation, housing, and financial resources - begin a notebook of services in my  neighborhood or community - follow-up on any referrals for help I am given    Why is this important?   Knowing how and where to find help for yourself or family in your neighborhood and community is an important skill.  You will want to take some steps to learn how.         Track and Manage Symptoms-Heart Failure       Timeframe:  Long-Range Goal Priority:  High Start Date:    12/03/20                         Expected End Date:  05/11/21                     Follow Up Date 05/11/21    - attend scheduled appointments - begin a heart failure diary - bring diary to all appointments - eat more whole grains, fruits and vegetables, lean meats and healthy fats - know when to call the doctor - track symptoms and what helps feel better or worse  - contact provider with questions or concerns - call provider with weight gain of 3# in 24 hours or 5# in a week - take all medications as directed - work with [facility] Pharmacist for medication management 03/10/21   Why is this important?   You will be able to handle your symptoms  better if you keep track of them.  Making some simple changes to your lifestyle will help.  Eating healthy is one thing you can do to take good care of yourself.            Please see education materials related to CHF provided by MyChart link.  Patient has access to MyChart and can view provided education  Telephone follow up appointment with Managed Medicaid care management team member scheduled for:05/11/21 @ 10:30am  [name] RN, BSN [facility] RN Care Coordinator   Following is a copy of your plan of care:  Patient Care Plan: Heart Failure (Adult)     Problem Identified: Symptom Exacerbation (Heart Failure)      Long-Range Goal: Symptom Exacerbation Prevented or Minimized   Start Date: 12/03/2020  Expected End Date: 05/11/2021  Recent Progress: On track  Priority: High  Note:   Current Barriers:  Knowledge deficit  related to basic heart failure pathophysiology and self care management Financial strain-working on disability, applied to [employer] Social Support Does not adhere to prescribed medication regimen Lacks social connections Does not contact provider office for questions/concerns Case Manager Clinical Goal(s):  patient will weigh self daily and record patient will verbalize understanding of Heart Failure Action Plan and when to call doctor patient will take all Heart Failure mediations as prescribed patient will weigh daily and record (notifying MD of 3 lb weight gain over night or 5 lb in a week) Interventions:  Basic overview and discussion of pathophysiology of Heart Failure reviewed  Provided verbal education on low sodium diet Advised patient to weigh each morning after emptying bladder Discussed importance of daily weight and advised patient to weigh and record daily Reviewed role of diuretics in prevention of fluid overload and management of heart failure Reviewed upcoming appointments  Provided therapeutic listening, discussed StepUp program, seeking employment, encouragement provided Reviewed medications and discussed patient taking as directed, having difficulty affording medications, patient scheduled with [facility] Pharmacist today RNCM will continue to follow, patient appreciates the interaction Patient Goals/Self-Care Activities - call [phone] for status of disability application/follow up with your Attorney for updates - work with Wyatt Portela, [place] for assistance with resources for food, transportation, housing, and financial resources - begin a notebook of services in my neighborhood or community - follow-up on any referrals for help I am given - begin a heart failure diary - bring diary to all appointments - eat more whole grains, fruits and vegetables, lean meats and healthy fats - know when to call the doctor - track symptoms  and what helps feel better or worse  - contact provider with questions or concerns - call provider with weight gain of 3# in 24 hours or 5# in a week - take all medications as directed - work with [facility] Pharmacist for medication management on 03/10/21 Follow Up Plan: Telephone follow up appointment with care management team member scheduled for:05/11/21 @ 10:30am     Patient Care Plan: Medication Management     Problem Identified: Health Promotion or Disease Self-Management (General Plan of Care)      Goal: Medication Management   Note:   Current Barriers:  Unable to independently afford treatment regimen Will call Christy Sartorius at his Pharmacy and get co-pay to $0 Does not adhere to prescribed medication regimen Does not maintain contact with provider office   Pharmacist Clinical Goal(s):  Over the next 60 days, patient will verbalize ability to afford treatment regimen  contact provider office for questions/concerns as evidenced notation of same in electronic health record through collaboration with PharmD and provider.    Interventions: Inter-disciplinary care team collaboration (see longitudinal plan of care) Comprehensive medication review performed; medication list updated in electronic medical record    Patient Goals/Self-Care Activities Over the next 60 days, patient will:  - take medications as prescribed collaborate with provider on medication access solutions  Follow Up Plan: The care management team will reach out to the patient again over the next 90 days.

## 2021-03-11 ENCOUNTER — Encounter: Payer: Self-pay | Admitting: Physician Assistant

## 2021-03-11 NOTE — Progress Notes (Signed)
  Mr Allen Gould is here in the shelter.   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   He has been homeless since May or so. Was living with his sister, then with his niece, but is now on his own.   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 Covid lost him his job.  He also had an episode of weakness, diaphoresis and was in rapid atrial fib.   He still has problems w/ DOE. He cannot walk to the bus stop w/out gasping for breath and feeling weak. Does not know what his heart rate is during this. He checks his BP twice a day, HR is usually 60-80, more often 60.   He is struggling w/ hip pain  He needs bilat hip replacements, but cannot have them until he loses weight. He needs to get to 275 lbs, currently 314.  He is taking Ibuprofen, is asked to minimize this because of the Eliquis. He was given Lidocaine patches and Salonpas to use, as well as Voltaren gel.   BP 120/84 HR 88, O2 sats 98% CBG was 116 this am, was 108 here. He was given protein bars and shakes. He is requested to have a protein snack every afternoon.   Rosaria Ferries, PA-C 03/11/2021 4:26 PM

## 2021-03-18 ENCOUNTER — Telehealth: Payer: Self-pay

## 2021-03-18 DIAGNOSIS — I422 Other hypertrophic cardiomyopathy: Secondary | ICD-10-CM

## 2021-03-18 NOTE — Telephone Encounter (Signed)
-----   Message from [name], MD sent at 03/17/2021  5:43 PM EDT ----- Please refer to Dr. Gasper Sells for evaluation of possible HOCM

## 2021-03-19 ENCOUNTER — Ambulatory Visit: Payer: Medicaid Other | Admitting: Behavioral Health

## 2021-03-25 ENCOUNTER — Encounter: Payer: Self-pay | Admitting: Internal Medicine

## 2021-03-25 ENCOUNTER — Ambulatory Visit (INDEPENDENT_AMBULATORY_CARE_PROVIDER_SITE_OTHER): Payer: Medicaid Other | Admitting: Internal Medicine

## 2021-03-25 ENCOUNTER — Other Ambulatory Visit: Payer: Self-pay

## 2021-03-25 VITALS — BP 112/68 | HR 63 | Ht 70.0 in | Wt 301.0 lb

## 2021-03-25 DIAGNOSIS — I452 Bifascicular block: Secondary | ICD-10-CM

## 2021-03-25 DIAGNOSIS — I422 Other hypertrophic cardiomyopathy: Secondary | ICD-10-CM

## 2021-03-25 DIAGNOSIS — Z950 Presence of cardiac pacemaker: Secondary | ICD-10-CM

## 2021-03-25 NOTE — Progress Notes (Signed)
Cardiology Office Note:    Date:  03/25/2021   ID:  Allen Gould, DOB 1966-01-27, MRN [id number]  PCP:  [name], MD   [facility] Providers Cardiologist:  Fransico Him, MD Electrophysiologist:  Vickie Epley, MD     Referring MD: [name], MD   CC: HCM Clinic Eval  History of Present Illness:    Allen Gould is a 55 y.o. male with a hx of Mild TAA, HTN with DM, Mild non obstructive CAD per 2018 LHC Morbid Obesity, s/p PPM, Hypertrophy on Cardiac MRI, PAF and PVCs, CKD Stage IIIb.  Given his young age, he has been evaluated for LV thickness 03/25/21.  Patient notes that he is doing pretty well on his new medications and feels more laid back. He notes a history of walking problems and could walk for a spell in his life.  Is pending a total hip replacement for these issues.  Recently does not need like chest pain and some chest soreness.  No SOB at rest but has DOE.  No SOB with standing up.  No SOB after ambulating. Notes that he has the sensation in his ear like he is going up to a high elevation.  No cardiac syncope.  Ambulatory blood pressure 112/68.  Notes that he has a long standing history of HTN and that he now checks it twice a day.   Past Medical History:  Diagnosis Date   Acquired dilation of ascending aorta and aortic root ([place])    30m by echo 09/2020 but normal on Chest CTA 05/2020   Acute kidney injury ([place] 09/01/2019   Asthma    Balanitis 08/09/2014   Bifascicular block    Cough 08/09/2014   Diabetes mellitus without complication ([place]    Dilated aortic root (HCC)    410mon echo 09/2020   DM (diabetes mellitus) type 2, uncontrolled, without ketoacidosis 08/10/2013   Dyslipidemia 03/01/2014   Financial difficulties 08/09/2014   GERD (gastroesophageal reflux disease)    Health care maintenance 03/01/2014   HOCM (hypertrophic obstructive cardiomyopathy) ([place]   noted on cMRI but no SAM or LVOT gradient   Hypertension     Hypomagnesemia 05/29/2017   Insomnia 08/29/2013   Mitral stenosis    mean MVG 2m44m   Morbid obesity ([place]/12/2013   Near syncope 05/29/2017   Odontogenic infection of jaw 05/29/2017   PAC (premature atrial contraction)    PAF (paroxysmal atrial fibrillation) (HCC)    Pressure injury of skin 09/02/2019   PVCs (premature ventricular contractions)    Right shoulder pain 08/29/2013   Second degree AV block, Mobitz type I    Sepsis ([place]/10/2017   Sleep apnea    uses cpap    Past Surgical History:  Procedure Laterality Date   APPENDECTOMY     MASS EXCISION N/A 08/19/2020   Procedure: EXCISION OF SCROTAL CYSTS;  Surgeon: PacRobley FriesD;  Location: [facility];  Service: Urology;  Laterality: N/A;  1 HR   PACEMAKER IMPLANT N/A 06/24/2020   Procedure: PACEMAKER IMPLANT;  Surgeon: LamVickie EpleyD;  Location: [facility] [place] [facility];  Service: Cardiovascular;  Laterality: N/A;    Current Medications: Current Meds  Medication Sig   Accu-Chek Softclix Lancets lancets Check blood sugar 3 times a day   amLODipine (NORVASC) 10 MG tablet Take 1 tablet (10 mg total) by mouth daily.   apixaban (ELIQUIS) 5 MG TABS tablet Take 1 tablet (5 mg total) by mouth 2 (  two) times daily.   aspirin 81 MG EC tablet Take 81 mg by mouth daily. Swallow whole.   atorvastatin (LIPITOR) 80 MG tablet Take 1 tablet (80 mg total) by mouth daily.   buPROPion (WELLBUTRIN) 75 MG tablet Take 1 tablet (75 mg total) by mouth 2 (two) times daily.   clotrimazole-betamethasone (LOTRISONE) cream Apply 1 application topically 2 (two) times daily.   Dulaglutide (TRULICITY) 1.5 VV/6.1YW SOPN INJECT ONE SYRINGE (1.5 MG) UNDER THE SKIN EVERY WEEK   DULoxetine (CYMBALTA) 60 MG capsule TAKE 1 CAPSULE (60 MG TOTAL) BY MOUTH DAILY.   furosemide (LASIX) 40 MG tablet Take 40 mg by mouth every other day.   gabapentin (NEURONTIN) 300 MG capsule Take 1 capsule (300 mg total) by mouth in the morning, at noon, and at bedtime.    glucose blood (ACCU-CHEK GUIDE) test strip Check blood sugar 3 times a day   metFORMIN (GLUCOPHAGE) 500 MG tablet Take 1 tablet (500 mg total) by mouth 2 (two) times daily with a meal.   metoprolol succinate (TOPROL XL) 25 MG 24 hr tablet Take 1 tablet (25 mg total) by mouth at bedtime.   pantoprazole (PROTONIX) 40 MG tablet Take 1 tablet (40 mg total) by mouth daily.   valsartan (DIOVAN) 80 MG tablet Take 1 tablet (80 mg total) by mouth daily.   vitamin B-12 (CYANOCOBALAMIN) 1000 MCG tablet Take 1,000 mcg by mouth daily.     Allergies:   Shellfish allergy   Social History   Socioeconomic History   Marital status: Single    Spouse name: Not on file   Number of children: Not on file   Years of education: Not on file   Highest education level: Not on file  Occupational History   Not on file  Tobacco Use   Smoking status: Former    Types: Cigarettes    Quit date: 04/24/2005    Years since quitting: 15.9   Smokeless tobacco: Never  Vaping Use   Vaping Use: Never used  Substance and Sexual Activity   Alcohol use: Not Currently    Alcohol/week: 2.0 standard drinks    Types: 2 Cans of beer per week    Comment: Last drink 8 months ago   Drug use: No   Sexual activity: Not on file  Other Topics Concern   Not on file  Social History Narrative   Not on file   Social Determinants of Health   Financial Resource Strain: High Risk   Difficulty of Paying Living Expenses: Very hard  Food Insecurity: Food Insecurity Present   Worried About Charity fundraiser in the Last Year: Sometimes true   Ran Out of Food in the Last Year: Sometimes true  Transportation Needs: Unmet Transportation Needs   Lack of Transportation (Medical): No   Lack of Transportation (Non-Medical): Yes  Physical Activity: Not on file  Stress: No Stress Concern Present   Feeling of Stress : Only a little  Social Connections: Not on file     Family History: The patient's family history includes Diabetes in his  sister; Heart disease in an other family member; Hyperlipidemia in his sister; Kidney disease in his mother. History of coronary artery disease notable for grandmother. History of heart failure notable for mother and grandmother, father. History of arrhythmia notable for no members. Denies family history of sudden cardiac death including drowning, car accidents, or unexplained deaths in the family. No history of bicuspid aortic valve or aortic aneurysm or dissection.   No  history of cardiomyopathies including hypertrophic cardiomyopathy, left ventricular non-compaction, or arrhythmogenic right ventricular cardiomyopathy.  Has two sisters and one son.  ROS:   Please see the history of present illness.     All other systems reviewed and are negative.  EKGs/Labs/Other Studies Reviewed:    The following studies were reviewed today:  EKG:  EKG is  ordered today.  The ekg ordered today demonstrates  03/25/21: APVP 1st HB and BiVP  Cardiac Event Monitoring: Date: 05/07/20 Results: Normal sinus rhythm and sinus tachycardia. The average heart rate was 77bpm while in NSR and highest HR was 111bpm. Nonsustained wide complex tachycardia up to 6 beats. Nonsustained wide complex tachycardia up to 6 beats. Nonsustained atrial tachycardia up to 148bpm. Frequent PACs. Second degree Type I, Type 2 AV block and 3rd degree AV block with longest interval of no ventricular rhythm of 2.2 seconds. All episodes of AV block occurred during sleep.  Transthoracic Echocardiogram: Date: 09/29/20 Results: LVH without SAM 1. Left ventricular ejection fraction, by estimation, is 50 to 55%. The  left ventricle has low normal function. The left ventricle demonstrates  regional wall motion abnormalities (see scoring diagram/findings for  description). There is severe left  ventricular hypertrophy. Left ventricular diastolic parameters are  consistent with Grade I diastolic dysfunction (impaired relaxation).   Elevated left ventricular end-diastolic pressure.   2. Right ventricular systolic function is normal. The right ventricular  size is normal.   3. The mitral valve is normal in structure. Trivial mitral valve  regurgitation. Mild mitral stenosis. The mean mitral valve gradient is 3.0  mmHg.   4. The aortic valve is calcified. Aortic valve regurgitation is not  visualized. Mild to moderate aortic valve sclerosis/calcification is  present, without any evidence of aortic stenosis.   5. Aortic dilatation noted. There is mild dilatation of the aortic root,  measuring 42 mm. There is mild dilatation of the ascending aorta,  measuring 42 mm.   6. The inferior vena cava is normal in size with greater than 50%  respiratory variability, suggesting right atrial pressure of 3 mmHg.   CTPE: Date: 06/02/20 Results: Aortic Atherosclerosis CAC Left Coronary cusp Calcium that extends into LVOT Severe main PA dilation  CMR: Date: 02/03/21 Results: Maximal thickness 17 mm  Normal Native T1 signal, ability to null the myocardium, normal atrial size and LGE pattern are not consistent with cardiac amyloidosis.   In lieu of systemic disease that would cause hypertrophy, study consistent with hypertrophic cardiomyopathy.    Recent Labs: 06/02/2020: B Natriuretic Peptide 93.9 08/12/2020: ALT 19 11/18/2020: Hemoglobin 12.3; Platelets 229 01/30/2021: BUN 28; Creatinine, Ser 1.44; Potassium 4.9; Sodium 139  Recent Lipid Panel    Component Value Date/Time   CHOL 92 (L) 04/08/2020 0817   TRIG 87 04/08/2020 0817   HDL 40 04/08/2020 0817   CHOLHDL 2.3 04/08/2020 0817   CHOLHDL 6.0 02/28/2014 1508   VLDL NOT CALC 02/28/2014 1508   LDLCALC 35 04/08/2020 0817   LDLDIRECT 108 (H) 03/01/2014 0409    Physical Exam:    VS:  BP 112/68   Pulse 63   Ht 5' 10"  (1.778 m)   Wt (!) 301 lb (136.5 kg)   SpO2 98%   BMI 43.19 kg/m     Wt Readings from Last 3 Encounters:  03/25/21 (!) 301 lb (136.5 kg)   02/10/21 (!) 314 lb (142.4 kg)  01/30/21 (!) 309 lb 6.4 oz (140.3 kg)     GEN:  Obese well developed in no  acute distress HEENT: Normal NECK: No JVD  LYMPHATICS: No lymphadenopathy CARDIAC: RRR, no murmurs, rubs, gallops, no murmur with standing or with Valsalva RESPIRATORY:  Clear to auscultation without rales, wheezing or rhonchi  ABDOMEN: Soft, non-tender, non-distended MUSCULOSKELETAL:  No edema; No deformity  SKIN: Warm and dry NEUROLOGIC:  Alert and oriented x 3 PSYCHIATRIC:  Normal affect   ASSESSMENT:    1. Hypertrophic cardiomyopathy ([place])   2. Cardiac pacemaker in situ   3. Bifascicular block    PLAN:    Phenotypic Hypertrophy Suspect this is related to long standing hypertension Hx of AF CHADVASC 3 vs NA (if he had HCM) Non obstructive CAD Morbid Obesity, HTN and DM - he is now has well controlled BP but on norvasc, Toprol XL, Valsartan and with lasix - will do stress echo for LVOT gradient eval; if evidence of HOCM, his son should be evaluated - if not, we will optimize his medical therapy for hypertensive heart disease PRN f/u based on stress echo  Medication Adjustments/Labs and Tests Ordered: Current medicines are reviewed at length with the patient today.  Concerns regarding medicines are outlined above.  Orders Placed This Encounter  Procedures   Cardiac Stress Test: Informed Consent Details: Physician/Practitioner Attestation; Transcribe to consent form and obtain patient signature   EKG 12-Lead   ECHOCARDIOGRAM STRESS TEST    No orders of the defined types were placed in this encounter.   Patient Instructions  Medication Instructions:  Your physician recommends that you continue on your current medications as directed. Please refer to the Current Medication list given to you today.  *If you need a refill on your cardiac medications before your next appointment, please call your pharmacy*   Lab Work: None Ordered If you have labs (blood work)  drawn today and your tests are completely normal, you will receive your results only by: [place] (if you have MyChart) OR A paper copy in the mail If you have any lab test that is abnormal or we need to change your treatment, we will call you to review the results.   Testing/Procedures: Your physician has requested that you have a stress echocardiogram. For further information please visit [email or web address]. Please follow instruction sheet as given.   Follow-Up: At [facility], you and your health needs are our priority.  As part of our continuing mission to provide you with exceptional heart care, we have created designated Provider Care Teams.  These Care Teams include your primary Cardiologist (physician) and Advanced Practice Providers (APPs -  Physician Assistants and Nurse Practitioners) who all work together to provide you with the care you need, when you need it.   Your next appointment:     As Needed  The format for your next appointment:   Virtual or In Person  Provider:   You may see Rudean Haskell, MD or one of the following Advanced Practice Providers on your designated Care Team:   Melina Copa, PA-C Ermalinda Barrios, PA-C    Signed, Werner Lean, MD  03/25/2021 2:55 PM    [place]

## 2021-03-25 NOTE — Patient Instructions (Signed)
Medication Instructions:  Your physician recommends that you continue on your current medications as directed. Please refer to the Current Medication list given to you today.  *If you need a refill on your cardiac medications before your next appointment, please call your pharmacy*   Lab Work: None Ordered If you have labs (blood work) drawn today and your tests are completely normal, you will receive your results only by: [place] (if you have MyChart) OR A paper copy in the mail If you have any lab test that is abnormal or we need to change your treatment, we will call you to review the results.   Testing/Procedures: Your physician has requested that you have a stress echocardiogram. For further information please visit [email or web address]. Please follow instruction sheet as given.   Follow-Up: At [facility], you and your health needs are our priority.  As part of our continuing mission to provide you with exceptional heart care, we have created designated Provider Care Teams.  These Care Teams include your primary Cardiologist (physician) and Advanced Practice Providers (APPs -  Physician Assistants and Nurse Practitioners) who all work together to provide you with the care you need, when you need it.   Your next appointment:     As Needed  The format for your next appointment:   Virtual or In Person  Provider:   You may see Rudean Haskell, MD or one of the following Advanced Practice Providers on your designated Care Team:   Melina Copa, PA-C Ermalinda Barrios, PA-C

## 2021-03-26 ENCOUNTER — Other Ambulatory Visit: Payer: Self-pay | Admitting: Internal Medicine

## 2021-03-26 DIAGNOSIS — I10 Essential (primary) hypertension: Secondary | ICD-10-CM

## 2021-03-26 DIAGNOSIS — I5032 Chronic diastolic (congestive) heart failure: Secondary | ICD-10-CM

## 2021-03-26 NOTE — Telephone Encounter (Signed)
Next appt scheduled 04/01/21 with PCP.

## 2021-04-01 ENCOUNTER — Encounter: Payer: Self-pay | Admitting: Internal Medicine

## 2021-04-01 ENCOUNTER — Encounter: Payer: Self-pay | Admitting: *Deleted

## 2021-04-01 ENCOUNTER — Ambulatory Visit: Payer: Medicaid Other | Admitting: Internal Medicine

## 2021-04-01 ENCOUNTER — Other Ambulatory Visit: Payer: Self-pay

## 2021-04-01 VITALS — BP 115/70 | HR 60 | Temp 98.1°F | Ht 70.0 in | Wt 310.0 lb

## 2021-04-01 DIAGNOSIS — R109 Unspecified abdominal pain: Secondary | ICD-10-CM

## 2021-04-01 DIAGNOSIS — D649 Anemia, unspecified: Secondary | ICD-10-CM

## 2021-04-01 DIAGNOSIS — I10 Essential (primary) hypertension: Secondary | ICD-10-CM

## 2021-04-01 DIAGNOSIS — N179 Acute kidney failure, unspecified: Secondary | ICD-10-CM | POA: Diagnosis not present

## 2021-04-01 DIAGNOSIS — I5032 Chronic diastolic (congestive) heart failure: Secondary | ICD-10-CM | POA: Diagnosis not present

## 2021-04-01 DIAGNOSIS — F322 Major depressive disorder, single episode, severe without psychotic features: Secondary | ICD-10-CM

## 2021-04-01 DIAGNOSIS — R1011 Right upper quadrant pain: Secondary | ICD-10-CM | POA: Diagnosis not present

## 2021-04-01 DIAGNOSIS — Z794 Long term (current) use of insulin: Secondary | ICD-10-CM

## 2021-04-01 DIAGNOSIS — E119 Type 2 diabetes mellitus without complications: Secondary | ICD-10-CM | POA: Diagnosis present

## 2021-04-01 LAB — POCT GLYCOSYLATED HEMOGLOBIN (HGB A1C): Hemoglobin A1C: 6 % — AB (ref 4.0–5.6)

## 2021-04-01 LAB — GLUCOSE, CAPILLARY: Glucose-Capillary: 105 mg/dL — ABNORMAL HIGH (ref 70–99)

## 2021-04-01 NOTE — Congregational Nurse Program (Signed)
Bp taken w/ pt auto bp cuff 145/92 hr 62 Bp taken by CN 136/90 hr 60  Per pt bp cuff average recently is 127/81 hr 60 Cbg with pt meter 111  Ask if taking meds- YES Pt states this bp reading worries him, told him to discuss with pcp at appt today at 1445, he is agreeable

## 2021-04-01 NOTE — Patient Instructions (Signed)
It was so nice seeing you again today. In regards to your diabetes, your A1c is 6.0, so we will continue doing what you have been doing. Your blood pressure also looks wonderful so we will not make any medication changes there either.  For your right upper abdominal pain/side pain, we will check some basic labs today and I will give you a call with the results.  As we discussed, if these labs do not show anything remarkable, then I would recommend we obtain a CAT scan of your abdomen to look for a kidney stone.  In the meantime, if you develop any fever, chills, or severe worsening in your pain, I would recommend that you went to the emergency department where you could get imaging done quicker.  Please return in January so we can recheck your diabetes.

## 2021-04-02 ENCOUNTER — Encounter: Payer: Self-pay | Admitting: Internal Medicine

## 2021-04-02 ENCOUNTER — Observation Stay (HOSPITAL_COMMUNITY)
Admission: EM | Admit: 2021-04-02 | Discharge: 2021-04-03 | Disposition: A | Discharge status: Home / Self Care | Payer: Medicare Other | Attending: Internal Medicine | Admitting: Internal Medicine

## 2021-04-02 ENCOUNTER — Other Ambulatory Visit: Payer: Self-pay | Admitting: Internal Medicine

## 2021-04-02 ENCOUNTER — Emergency Department (HOSPITAL_COMMUNITY): Payer: Medicare Other

## 2021-04-02 ENCOUNTER — Encounter: Payer: Medicaid Other | Admitting: Internal Medicine

## 2021-04-02 DIAGNOSIS — K429 Umbilical hernia without obstruction or gangrene: Secondary | ICD-10-CM | POA: Insufficient documentation

## 2021-04-02 DIAGNOSIS — I110 Hypertensive heart disease with heart failure: Secondary | ICD-10-CM | POA: Diagnosis not present

## 2021-04-02 DIAGNOSIS — Z7984 Long term (current) use of oral hypoglycemic drugs: Secondary | ICD-10-CM | POA: Insufficient documentation

## 2021-04-02 DIAGNOSIS — I480 Paroxysmal atrial fibrillation: Secondary | ICD-10-CM | POA: Insufficient documentation

## 2021-04-02 DIAGNOSIS — Z7901 Long term (current) use of anticoagulants: Secondary | ICD-10-CM | POA: Diagnosis not present

## 2021-04-02 DIAGNOSIS — E119 Type 2 diabetes mellitus without complications: Secondary | ICD-10-CM | POA: Insufficient documentation

## 2021-04-02 DIAGNOSIS — Z7982 Long term (current) use of aspirin: Secondary | ICD-10-CM | POA: Insufficient documentation

## 2021-04-02 DIAGNOSIS — E875 Hyperkalemia: Secondary | ICD-10-CM | POA: Diagnosis present

## 2021-04-02 DIAGNOSIS — Z20822 Contact with and (suspected) exposure to covid-19: Secondary | ICD-10-CM | POA: Insufficient documentation

## 2021-04-02 DIAGNOSIS — I2510 Atherosclerotic heart disease of native coronary artery without angina pectoris: Secondary | ICD-10-CM | POA: Insufficient documentation

## 2021-04-02 DIAGNOSIS — R911 Solitary pulmonary nodule: Secondary | ICD-10-CM | POA: Diagnosis not present

## 2021-04-02 DIAGNOSIS — N179 Acute kidney failure, unspecified: Secondary | ICD-10-CM | POA: Insufficient documentation

## 2021-04-02 DIAGNOSIS — R109 Unspecified abdominal pain: Secondary | ICD-10-CM | POA: Insufficient documentation

## 2021-04-02 DIAGNOSIS — J45909 Unspecified asthma, uncomplicated: Secondary | ICD-10-CM | POA: Insufficient documentation

## 2021-04-02 DIAGNOSIS — Z794 Long term (current) use of insulin: Secondary | ICD-10-CM

## 2021-04-02 DIAGNOSIS — I5020 Unspecified systolic (congestive) heart failure: Secondary | ICD-10-CM | POA: Diagnosis not present

## 2021-04-02 LAB — CBC WITH DIFFERENTIAL/PLATELET
Abs Immature Granulocytes: 0.03 10*3/uL (ref 0.00–0.07)
Basophils Absolute: 0 10*3/uL (ref 0.0–0.2)
Basophils Absolute: 0 10*3/uL (ref 0.0–0.1)
Basophils Relative: 0 %
Basos: 0 %
EOS (ABSOLUTE): 0.7 10*3/uL — ABNORMAL HIGH (ref 0.0–0.4)
Eos: 7 %
Eosinophils Absolute: 0.5 10*3/uL (ref 0.0–0.5)
Eosinophils Relative: 6 %
HCT: 32.4 % — ABNORMAL LOW (ref 39.0–52.0)
Hematocrit: 31.6 % — ABNORMAL LOW (ref 37.5–51.0)
Hemoglobin: 10.7 g/dL — ABNORMAL LOW (ref 13.0–17.7)
Hemoglobin: 10.7 g/dL — ABNORMAL LOW (ref 13.0–17.0)
Immature Grans (Abs): 0 10*3/uL (ref 0.0–0.1)
Immature Granulocytes: 0 %
Immature Granulocytes: 0 %
Lymphocytes Absolute: 1.1 10*3/uL (ref 0.7–3.1)
Lymphocytes Relative: 14 %
Lymphs Abs: 1.2 10*3/uL (ref 0.7–4.0)
Lymphs: 10 %
MCH: 31.9 pg (ref 26.6–33.0)
MCH: 32.7 pg (ref 26.0–34.0)
MCHC: 33.9 g/dL (ref 31.5–35.7)
MCHC: 33 g/dL (ref 30.0–36.0)
MCV: 94 fL (ref 79–97)
MCV: 99.1 fL (ref 80.0–100.0)
Monocytes Absolute: 1 10*3/uL — ABNORMAL HIGH (ref 0.1–0.9)
Monocytes Absolute: 0.7 10*3/uL (ref 0.1–1.0)
Monocytes Relative: 9 %
Monocytes: 10 %
Neutro Abs: 5.7 10*3/uL (ref 1.7–7.7)
Neutrophils Absolute: 7.9 10*3/uL — ABNORMAL HIGH (ref 1.4–7.0)
Neutrophils Relative %: 71 %
Neutrophils: 73 %
Platelets: 190 10*3/uL (ref 150–450)
Platelets: 196 10*3/uL (ref 150–400)
RBC: 3.35 x10E6/uL — ABNORMAL LOW (ref 4.14–5.80)
RBC: 3.27 MIL/uL — ABNORMAL LOW (ref 4.22–5.81)
RDW: 12.6 % (ref 11.6–15.4)
RDW: 13.5 % (ref 11.5–15.5)
WBC: 10.8 10*3/uL (ref 3.4–10.8)
WBC: 8.2 10*3/uL (ref 4.0–10.5)
nRBC: 0 % (ref 0.0–0.2)

## 2021-04-02 LAB — CMP14 + ANION GAP
ALT: 10 IU/L (ref 0–44)
AST: 9 IU/L (ref 0–40)
Albumin/Globulin Ratio: 1.6 (ref 1.2–2.2)
Albumin: 4.6 g/dL (ref 3.8–4.9)
Alkaline Phosphatase: 71 IU/L (ref 44–121)
Anion Gap: 14 mmol/L (ref 10.0–18.0)
BUN/Creatinine Ratio: 20 (ref 9–20)
BUN: 48 mg/dL — ABNORMAL HIGH (ref 6–24)
Bilirubin Total: 0.2 mg/dL (ref 0.0–1.2)
CO2: 20 mmol/L (ref 20–29)
Calcium: 9.8 mg/dL (ref 8.7–10.2)
Chloride: 106 mmol/L (ref 96–106)
Creatinine, Ser: 2.39 mg/dL — ABNORMAL HIGH (ref 0.76–1.27)
Globulin, Total: 2.9 g/dL (ref 1.5–4.5)
Glucose: 86 mg/dL (ref 70–99)
Potassium: 6.3 mmol/L — ABNORMAL HIGH (ref 3.5–5.2)
Sodium: 140 mmol/L (ref 134–144)
Total Protein: 7.5 g/dL (ref 6.0–8.5)
eGFR: 31 mL/min/{1.73_m2} — ABNORMAL LOW (ref >59)

## 2021-04-02 LAB — BASIC METABOLIC PANEL
Anion gap: 5 (ref 5–15)
BUN: 34 mg/dL — ABNORMAL HIGH (ref 6–20)
CO2: 23 mmol/L (ref 22–32)
Calcium: 9.8 mg/dL (ref 8.9–10.3)
Chloride: 110 mmol/L (ref 98–111)
Creatinine, Ser: 1.62 mg/dL — ABNORMAL HIGH (ref 0.61–1.24)
GFR, Estimated: 49 mL/min — ABNORMAL LOW (ref >60)
Glucose, Bld: 90 mg/dL (ref 70–99)
Potassium: 5.6 mmol/L — ABNORMAL HIGH (ref 3.5–5.1)
Sodium: 138 mmol/L (ref 135–145)

## 2021-04-02 LAB — I-STAT CHEM 8, ED
BUN: 35 mg/dL — ABNORMAL HIGH (ref 6–20)
Calcium, Ion: 1.34 mmol/L (ref 1.15–1.40)
Chloride: 109 mmol/L (ref 98–111)
Creatinine, Ser: 1.7 mg/dL — ABNORMAL HIGH (ref 0.61–1.24)
Glucose, Bld: 86 mg/dL (ref 70–99)
HCT: 33 % — ABNORMAL LOW (ref 39.0–52.0)
Hemoglobin: 11.2 g/dL — ABNORMAL LOW (ref 13.0–17.0)
Potassium: 5.7 mmol/L — ABNORMAL HIGH (ref 3.5–5.1)
Sodium: 140 mmol/L (ref 135–145)
TCO2: 23 mmol/L (ref 22–32)

## 2021-04-02 LAB — RESP PANEL BY RT-PCR (FLU A&B, COVID) ARPGX2
Influenza A by PCR: NEGATIVE
Influenza B by PCR: NEGATIVE
SARS Coronavirus 2 by RT PCR: NEGATIVE

## 2021-04-02 LAB — MICROSCOPIC EXAMINATION
Bacteria, UA: NONE SEEN
Casts: NONE SEEN /lpf
Epithelial Cells (non renal): >10 /hpf — AB (ref 0–10)
RBC, Urine: NONE SEEN /hpf (ref 0–2)
WBC, UA: >30 /hpf — AB (ref 0–5)

## 2021-04-02 LAB — URINALYSIS, ROUTINE W REFLEX MICROSCOPIC
Bilirubin Urine: NEGATIVE
Bilirubin, UA: NEGATIVE
Glucose, UA: NEGATIVE
Glucose, UA: NEGATIVE mg/dL
Hgb urine dipstick: NEGATIVE
Ketones, UA: NEGATIVE
Ketones, ur: NEGATIVE mg/dL
Nitrite, UA: NEGATIVE
Nitrite: NEGATIVE
Protein, ur: NEGATIVE mg/dL
RBC, UA: NEGATIVE
Specific Gravity, UA: 1.021 (ref 1.005–1.030)
Specific Gravity, Urine: 1.016 (ref 1.005–1.030)
Urobilinogen, Ur: 0.2 mg/dL (ref 0.2–1.0)
pH, UA: 5 (ref 5.0–7.5)
pH: 5 (ref 5.0–8.0)

## 2021-04-02 LAB — GLUCOSE, CAPILLARY: Glucose-Capillary: 76 mg/dL (ref 70–99)

## 2021-04-02 LAB — POTASSIUM: Potassium: 4.7 mmol/L (ref 3.5–5.1)

## 2021-04-02 LAB — TROPONIN I (HIGH SENSITIVITY): Troponin I (High Sensitivity): 8 ng/L (ref <18)

## 2021-04-02 LAB — MAGNESIUM: Magnesium: 1.4 mg/dL — ABNORMAL LOW (ref 1.7–2.4)

## 2021-04-02 MED ORDER — INSULIN ASPART 100 UNIT/ML IJ SOLN: 0.0000 [IU] | Freq: Three times a day (TID) | INTRAMUSCULAR | Status: DC

## 2021-04-02 MED ORDER — SODIUM ZIRCONIUM CYCLOSILICATE 10 G PO PACK
10.0000 g | PACK | Freq: Every day | ORAL | Status: DC
  Administered 2021-04-02 – 2021-04-03 (×2): 10 g via ORAL
  Filled 2021-04-02 (×3): qty 1

## 2021-04-02 MED ORDER — AMLODIPINE BESYLATE 10 MG PO TABS
10.0000 mg | ORAL_TABLET | Freq: Every day | ORAL | Status: DC
  Administered 2021-04-02 – 2021-04-03 (×2): 10 mg via ORAL
  Filled 2021-04-02: qty 1
  Filled 2021-04-02: qty 2

## 2021-04-02 MED ORDER — PANTOPRAZOLE SODIUM 40 MG PO TBEC
40.0000 mg | DELAYED_RELEASE_TABLET | Freq: Every day | ORAL | Status: DC
  Administered 2021-04-02 – 2021-04-03 (×2): 40 mg via ORAL
  Filled 2021-04-02 (×2): qty 1

## 2021-04-02 MED ORDER — GABAPENTIN 300 MG PO CAPS
300.0000 mg | ORAL_CAPSULE | Freq: Three times a day (TID) | ORAL | Status: DC
  Administered 2021-04-02 – 2021-04-03 (×2): 300 mg via ORAL
  Filled 2021-04-02 (×2): qty 1

## 2021-04-02 MED ORDER — INSULIN ASPART 100 UNIT/ML IV SOLN
5.0000 [IU] | Freq: Once | INTRAVENOUS | Status: AC
Start: 2021-04-02 — End: 2021-04-02
  Administered 2021-04-02: 5 [IU] via INTRAVENOUS

## 2021-04-02 MED ORDER — ASPIRIN EC 81 MG PO TBEC
81.0000 mg | DELAYED_RELEASE_TABLET | Freq: Every day | ORAL | Status: DC
  Administered 2021-04-02 – 2021-04-03 (×2): 81 mg via ORAL
  Filled 2021-04-02 (×2): qty 1

## 2021-04-02 MED ORDER — SODIUM CHLORIDE 0.9 % IV BOLUS: 1000.0000 mL | Freq: Once | INTRAVENOUS | Status: DC

## 2021-04-02 MED ORDER — DEXTROSE 50 % IV SOLN
1.0000 | Freq: Once | INTRAVENOUS | Status: AC
  Administered 2021-04-02: 50 mL via INTRAVENOUS
  Filled 2021-04-02: qty 50

## 2021-04-02 MED ORDER — ATORVASTATIN CALCIUM 80 MG PO TABS
80.0000 mg | ORAL_TABLET | Freq: Every day | ORAL | Status: DC
  Administered 2021-04-02 – 2021-04-03 (×2): 80 mg via ORAL
  Filled 2021-04-02: qty 2
  Filled 2021-04-02: qty 1

## 2021-04-02 MED ORDER — SODIUM CHLORIDE 0.9 % IV BOLUS
500.0000 mL | Freq: Once | INTRAVENOUS | Status: AC
Start: 2021-04-02 — End: 2021-04-02
  Administered 2021-04-02: 500 mL via INTRAVENOUS

## 2021-04-02 MED ORDER — INSULIN ASPART 100 UNIT/ML IJ SOLN
0.0000 [IU] | Freq: Every day | INTRAMUSCULAR | Status: DC
Start: 2021-04-02 — End: 2021-04-03

## 2021-04-02 MED ORDER — APIXABAN 5 MG PO TABS
5.0000 mg | ORAL_TABLET | Freq: Two times a day (BID) | ORAL | Status: DC
  Administered 2021-04-02 – 2021-04-03 (×2): 5 mg via ORAL
  Filled 2021-04-02 (×2): qty 1

## 2021-04-02 MED ORDER — ACETAMINOPHEN 325 MG PO TABS: 650.0000 mg | ORAL_TABLET | Freq: Four times a day (QID) | ORAL | Status: DC | PRN

## 2021-04-02 MED ORDER — ACETAMINOPHEN 650 MG RE SUPP: 650.0000 mg | Freq: Four times a day (QID) | RECTAL | Status: DC | PRN

## 2021-04-02 MED ORDER — BUPROPION HCL 75 MG PO TABS
75.0000 mg | ORAL_TABLET | Freq: Two times a day (BID) | ORAL | Status: DC
  Administered 2021-04-02 – 2021-04-03 (×2): 75 mg via ORAL
  Filled 2021-04-02 (×3): qty 1

## 2021-04-02 MED ORDER — DULOXETINE HCL 60 MG PO CPEP
60.0000 mg | ORAL_CAPSULE | Freq: Every day | ORAL | Status: DC
  Administered 2021-04-02 – 2021-04-03 (×2): 60 mg via ORAL
  Filled 2021-04-02 (×2): qty 1

## 2021-04-02 MED ORDER — MAGNESIUM SULFATE IN D5W 1-5 GM/100ML-% IV SOLN
1.0000 g | Freq: Once | INTRAVENOUS | Status: AC
  Administered 2021-04-02: 1 g via INTRAVENOUS
  Filled 2021-04-02: qty 100

## 2021-04-02 MED ORDER — FUROSEMIDE 40 MG PO TABS
40.0000 mg | ORAL_TABLET | ORAL | Status: DC
  Administered 2021-04-02: 40 mg via ORAL
  Filled 2021-04-02: qty 2

## 2021-04-02 NOTE — Assessment & Plan Note (Signed)
Hemoglobin is down 12.3> 10.7 over the past 5 months.  He denies any overt source of bleeding including hematemesis, melena, hematochezia, or hematuria.  Of note, he is on Eliquis.  Question relation to decreased renal function. I would like to recheck iron studies today given this new acute drop.  We will also check a retic count.

## 2021-04-02 NOTE — Hospital Course (Addendum)
#  Hyperkalemia Potassium elevated to 6.3 in the [facility] yesterday and the patient was called to return to the [facility] for repeat lab work. He has transportation difficulties and was unable to do this, and thus was recommended to seek evaluation in the ED. He does endorse nausea and vomiting that has been going on for a while, and poor oral intake. Denies chest pain, palpitations, myalgias, numbness/tingling, or any other s/s of hyperkalemia. Potassium in the ED was 5.6 and the patient received insulin/D50 for hyperkalemia, as well as lokelma. Patient did not have any EKG changes noted and did not receive calcium gluconate. Troponin level within normal limits. K on the day of discharge was within norma limits and the patient was feeling well. Hyperkalemia was thought to be secondary to this patient's AKI, and could have also possibly been exacerbated by his valsartan. Advised patient to stop his valsartan when he has episodes of vomiting/diarrhea, and to seek evaluation with his PCP.   #AKI #GI losses  Patient noted to have an AKI at the [facility] on 11/9, with Cr 2.39, with baseline around 1.5-1.7. On admission, Cr is improved to 1.62. AKI likely prerenal in nature secondary to GI losses. CT renal study performed and showed no urinary tract calculi or other acute abnormalities. Cr improved beyond the patient's baseline on day of discharge to 1.3. Vomiting/diarrhea could have been secondary to the patient's recent dosage increase of his Trulicity. Will decrease this dose and recommend he follow up with his PCP.   #Hypomagnesemia Mag low at 1.4 in the ED. Repleted with magnesium sulfate IVPB. Recommend follow up of lab work with PCP.    #HFpEF #Hypertrophic cardiomyopathy #Afib Cardiomyopathy is thought to be secondary to phenotypic hypertrophy. Patient also has a history of Afib, CHADVASC 3 (on Eliquis). Blood pressure has been well controlled on norvasc, Toprol XL, valsartan, and lasix. Patient is scheduled for a  stress echo to evaluate for HOCM. Resumed home meds on discharge.  #T2DM Patient's blood sugars have been well controlled and A1c 6.0. On Trulicity 1.5 mg once weekly and metformin 500 mg bid at home, however, will hold these medications given AKI and unclear timing of last Trulicity dose. Will decrease Trulicity dose on discharge given the patient's vomiting and diarrhea.

## 2021-04-02 NOTE — ED Notes (Signed)
Patient transported to CT 

## 2021-04-02 NOTE — Assessment & Plan Note (Signed)
Appears euvolemic on exam.  His baseline weights seem to be typically 310- 315 pounds.  At his most recent cardiology appointment on 11/2, his weight is recorded as 301.  His weight is back up to 310 pounds today.  Question if recorded weight on 11/2 was an error. Continue current management per cardiology.

## 2021-04-02 NOTE — Assessment & Plan Note (Addendum)
Creatinine 1.4> 2.4, BUN 28> 48 over the past 2 months.  BMP today also is remarkable for hyperkalemia to 6.3.  No recent medication changes over the past 2 months.  Question relation to decreased oral intake in the setting of his abdominal pain. UA: WBC greater than 30/hpf, no RBCs, no casts, no bacteria.  Specific gravity is 1.02 making prerenal less likely. Plan Given remarkable changes in a relatively short interim, we will repeat his labs today stat as well as obtain an EKG and urine urea to creatinine ratio to help delineate prerenal versus intrarenal. Hold nephrotoxic agents and renally cleared agents: Lasix, metformin, and valsartan

## 2021-04-02 NOTE — Progress Notes (Signed)
Office Visit   Patient ID: Allen Gould, male    DOB: [birth year], 55 y.o.   MRN: [id number]   PCP: [name], MD   Subjective:  CC: Diabetes, Hypertension, and Abdominal Pain   Allen Gould is a 55 y.o. year old male who presents for the above chronic conditions. Please refer to problem based charting for assessment and plan.   ACTIVE MEDICATIONS   No facility-administered medications prior to visit.   Outpatient Medications Prior to Visit  Medication Sig   Accu-Chek Softclix Lancets lancets Check blood sugar 3 times a day   amLODipine (NORVASC) 10 MG tablet Take 1 tablet (10 mg total) by mouth daily.   apixaban (ELIQUIS) 5 MG TABS tablet Take 1 tablet (5 mg total) by mouth 2 (two) times daily.   aspirin 81 MG EC tablet Take 81 mg by mouth daily. Swallow whole.   atorvastatin (LIPITOR) 80 MG tablet Take 1 tablet (80 mg total) by mouth daily.   buPROPion (WELLBUTRIN) 75 MG tablet TAKE 1 TABLET (75 MG TOTAL) BY MOUTH 2 (TWO) TIMES DAILY.   clotrimazole-betamethasone (LOTRISONE) cream Apply 1 application topically 2 (two) times daily.   Dulaglutide (TRULICITY) 1.5 RC/7.8LF SOPN INJECT ONE SYRINGE (1.5 MG) UNDER THE SKIN EVERY WEEK   DULoxetine (CYMBALTA) 60 MG capsule TAKE 1 CAPSULE (60 MG TOTAL) BY MOUTH DAILY.   furosemide (LASIX) 40 MG tablet Take 40 mg by mouth every other day.   gabapentin (NEURONTIN) 300 MG capsule Take 1 capsule (300 mg total) by mouth in the morning, at noon, and at bedtime.   glucose blood (ACCU-CHEK GUIDE) test strip Check blood sugar 3 times a day   metFORMIN (GLUCOPHAGE) 500 MG tablet Take 1 tablet (500 mg total) by mouth 2 (two) times daily with a meal.   metoprolol succinate (TOPROL XL) 25 MG 24 hr tablet Take 1 tablet (25 mg total) by mouth at bedtime.   pantoprazole (PROTONIX) 40 MG tablet Take 1 tablet (40 mg total) by mouth daily.   valsartan (DIOVAN) 80 MG tablet TAKE 1 TABLET BY MOUTH DAILY.   vitamin B-12 (CYANOCOBALAMIN) 1000 MCG  tablet Take 1,000 mcg by mouth daily.     Objective:   BP 115/70 (BP Location: Right Arm, Patient Position: Sitting, Cuff Size: Normal)   Pulse 60   Temp 98.1 F (36.7 C) (Oral)   Ht 5' 10"  (1.778 m)   Wt (!) 310 lb (140.6 kg)   SpO2 99%   BMI 44.48 kg/m  Wt Readings from Last 3 Encounters:  04/01/21 (!) 310 lb (140.6 kg)  03/25/21 (!) 301 lb (136.5 kg)  02/10/21 (!) 314 lb (142.4 kg)    BP Readings from Last 3 Encounters:  04/02/21 139/82  04/01/21 115/70  03/25/21 112/68   General: Well-appearing male no acute distress sitting in the wheelchair Cardiac: Heart is regular rate and rhythm Pulm: Lungs clear to throughout GI: Abdomen is distended.  Tenderness to palpation of the right upper quadrant and right flank.  No suprapubic tenderness.  Bowel sounds active. Skin: No rash or lesion on limited exam.  Dry appearing.  Health Maintenance:   Health Maintenance  Topic Date Due   COVID-19 Vaccine (1) Never done   Zoster Vaccines- Shingrix (1 of 2) Never done   INFLUENZA VACCINE  12/22/2020   Pneumococcal Vaccine 107-7 Years old (2 - PCV) 11/13/2021 (Originally 08/12/2014)   COLONOSCOPY (Pts 45-36yr Insurance coverage will need to be confirmed)  11/13/2021 (Originally 02/24/2009)   TETANUS/TDAP  11/13/2021 (Originally 02/25/1983)   LIPID PANEL  04/08/2021   HEMOGLOBIN A1C  07/02/2021   OPHTHALMOLOGY EXAM  09/03/2021   FOOT EXAM  10/01/2021   Hepatitis C Screening  Completed   HIV Screening  Completed   HPV VACCINES  Aged Out    Assessment & Plan:   Problem List Items Addressed This Visit       Cardiovascular and Mediastinum   (HFpEF) heart failure with preserved ejection fraction (HCC) (Chronic)    Appears euvolemic on exam.  His baseline weights seem to be typically 310- 315 pounds.  At his most recent cardiology appointment on 11/2, his weight is recorded as 301.  His weight is back up to 310 pounds today.  Question if recorded weight on 11/2 was an error. Continue  current management per cardiology.      Essential hypertension    Current medications: Amlodipine 10 mg daily, metoprolol succinate 25 mg daily, valsartan 80 mg daily, Lasix 40 mg every other day  Blood pressure is at goal in the office today. BP Readings from Last 3 Encounters:  04/01/21 115/70  03/25/21 112/68  02/10/21 132/80   Denies issues with dizziness, lightheadedness or orthostasis. BMP shows a significant increase in BUN/creatinine as well as hyperkalemia to 6.3. Plan Continue metoprolol succinate 71m daily, amlodipine 157mdaily.  Hold valsartan and Lasix Repeat BMP        Endocrine   Type II diabetes mellitus ([facility]- Primary    Current medications: metformin 50074mwice daily, Trulicity 1.5 mg weekly, gabapentin 300 mg 3 times daily Assessment: Diabetes is well controlled. A1C is 6.0.  Denies adverse medication effects. Diabetic foot exam performed within the last 12 months. Diabetic retinopathy screening performed within the last 12 months and negative for retinopathy Plan Continue Trulicity 1.5 mg weekly Continue gabapentin 300 mg 3 times daily for diabetic neuropathy Hold metformin in setting of AKI, may resume after resolution Diabetic foot exam due 09/2021 Diabetic retinopathy screening due 08/2021      Relevant Orders   POC Hbg A1C (Completed)     Genitourinary   AKI (acute kidney injury) ([facility]  Creatinine 1.4> 2.4, BUN 28> 48 over the past 2 months.  BMP today also is remarkable for hyperkalemia to 6.3.  No recent medication changes over the past 2 months.  Question relation to decreased oral intake in the setting of his abdominal pain. UA: WBC greater than 30/hpf, no RBCs, no casts, no bacteria.  Specific gravity is 1.02 making prerenal less likely. Plan Given remarkable changes in a relatively short interim, we will repeat his labs today stat as well as obtain an EKG and urine urea to creatinine ratio to help delineate prerenal versus intrarenal. Hold  nephrotoxic agents and renally cleared agents: Lasix, metformin, and valsartan         Other   Severe depression (HCC) (Chronic)    Current medications: cymbalta 35m51mily, Wellbutrin 75 mg twice daily  Interim history:  Cymbalta increased to 60 mg during his office visit with Dr. DeanMarlou Sak in July. Wellbutrin was added at time as well. He moved out of his niece's home and is now currently residing in a shelter.  HPI: He tells me that he has been experiencing intermittent angry outbursts.  He notes that he has not had this happen to him in the past.  After further questioning, it seems to correlate with when he started the Wellbutrin.  I discussed with him the different effects that  antidepressants can have on mood.  I recommended we discontinue the bupropion however he remains adamant that he would like to continue on it as he does feel like it has helped his mood.  He has been following with Dr. Theodis Shove.  He also tells me that he has developed a close friendship with one of the women in the shelter and that she provides a great deal of social support to him. Assessment PHQ 9 is 18 today.  It was 26 in July and 9 in August.  He denies thoughts of harming himself.  Given that he is on max dose of Cymbalta and is having some agitation side effects of the bupropion, I offered referral to psychiatry for further management.  Overall, he feels like he is doing well and implies that his mood is incongruent with his PHQ-9 score. Plan Continue Cymbalta 60 mg daily and Wellbutrin 75 mg twice daily per his request We will continue to monitor his symptoms and if they are being inadequately controlled, will continue to recommend referral to psychiatry       Normocytic anemia    Hemoglobin is down 12.3> 10.7 over the past 5 months.  He denies any overt source of bleeding including hematemesis, melena, hematochezia, or hematuria.  Of note, he is on Eliquis.  Question relation to decreased renal  function. I would like to recheck iron studies today given this new acute drop.  We will also check a retic count.      Flank pain    HPI: Anjel notes a 1 week history of right upper quadrant/right flank pain.  The pain is constant but intermittently worsens and he seems to be describing colicky nature as well.  Associated symptoms include at least 1 episode daily of loose stools with a yellowish colored froth.no mucus in his stool.  He denies fever, chills, nausea/vomiting, dysuria, hematuria or hematochezia.  Eating does not improve or exacerbate symptoms. Exam:  Abdomen is moderately distended and somewhat firm.  Diffuse tenderness to even light palpation in the right upper quadrant and right flank area.  No suprapubic tenderness.  No rebound tenderness. Assessment: Differential diagnosis at this time includes cholecystitis, hepatitis, nephrolithiasis, or complicated UTI.   He is afebrile in the office today.  CBC shows a WBC of 10.8 with neutrophil predominance and no left shift making infection less likely. Bilirubin 0.2, liver enzymes/alk phos are unremarkable making acute hepatitis or cholecystitis less likely. The acute worsening in his renal function raises concern for renal pathology although could also be a consequence of decreased oral intake due to the abdominal pain.  UA shows +1 leukocytes, no RBCs or nitrites.  Based on his symptoms, nephrolithiasis would be my top consideration. Plan Repeat labs today will help guide whether he needs hospital admission versus an outpatient work-up.  Ideally, I would like to get a CT renal study however due to the acute worsening in his renal function, this may not be feasible.  We could consider obtaining a renal ultrasound to look for hydronephrosis in case he had a stone blocking his right ureter.      Other Visit Diagnoses     RUQ pain       Relevant Orders   CMP14 + Anion Gap (Completed)   CBC with Diff (Completed)   Right flank pain        Relevant Orders   Urinalysis, Reflex Microscopic (Completed)        Return in about 1 week (around 04/08/2021).  Addendum: I called and spoke with Fermin about his labs and recommended he come back into the clinic today, 11/10, in order for [place] to obtain repeat BMP and EKG.  Unfortunately, transportation was a prohibiting factor, so he has chosen to use EMS for transportation to the ED for ongoing work-up.  We will have him follow-up in 1 week in our clinic.  Pt discussed with Dr. Adolm Joseph, MD Internal Medicine Resident PGY-3 [facility] 04/02/2021 11:11 AM

## 2021-04-02 NOTE — ED Notes (Signed)
Got patient on the monitor patient is resting with call bell in reach  ?

## 2021-04-02 NOTE — Assessment & Plan Note (Signed)
Current medications: metformin 512m twice daily, Trulicity 1.5 mg weekly, gabapentin 300 mg 3 times daily Assessment: Diabetes is well controlled. A1C is 6.0.  Denies adverse medication effects. Diabetic foot exam performed within the last 12 months. Diabetic retinopathy screening performed within the last 12 months and negative for retinopathy Plan  Continue Trulicity 1.5 mg weekly  Continue gabapentin 300 mg 3 times daily for diabetic neuropathy  Hold metformin in setting of AKI, may resume after resolution  Diabetic foot exam due 09/2021  Diabetic retinopathy screening due 08/2021

## 2021-04-02 NOTE — Assessment & Plan Note (Addendum)
HPI: Allen Gould notes a 1 week history of right upper quadrant/right flank pain.  The pain is constant but intermittently worsens and he seems to be describing colicky nature as well.  Associated symptoms include at least 1 episode daily of loose stools with a yellowish colored froth.no mucus in his stool.  He denies fever, chills, nausea/vomiting, dysuria, hematuria or hematochezia.  Eating does not improve or exacerbate symptoms. Exam:  Abdomen is moderately distended and somewhat firm.  Diffuse tenderness to even light palpation in the right upper quadrant and right flank area.  No suprapubic tenderness.  No rebound tenderness. Assessment: Differential diagnosis at this time includes cholecystitis, hepatitis, nephrolithiasis, or complicated UTI.   He is afebrile in the office today.  CBC shows a WBC of 10.8 with neutrophil predominance and no left shift making infection less likely. Bilirubin 0.2, liver enzymes/alk phos are unremarkable making acute hepatitis or cholecystitis less likely. The acute worsening in his renal function raises concern for renal pathology although could also be a consequence of decreased oral intake due to the abdominal pain.  UA shows +1 leukocytes, no RBCs or nitrites.  Based on his symptoms, nephrolithiasis would be my top consideration. Plan Repeat labs today will help guide whether he needs hospital admission versus an outpatient work-up.  Ideally, I would like to get a CT renal study however due to the acute worsening in his renal function, this may not be feasible.  We could consider obtaining a renal ultrasound to look for hydronephrosis in case he had a stone blocking his right ureter.

## 2021-04-02 NOTE — ED Triage Notes (Signed)
Pt sent here by PCP for high Potassium levels. Pt had labs drawn yesterday. PT  c/o some abd pain w/ N/V/D.

## 2021-04-02 NOTE — ED Provider Notes (Signed)
Emergency Medicine Provider Triage Evaluation Note  Allen Gould , a 55 y.o. male  was evaluated in triage.  Sent in by PCP today for hyperkalemia.  Patient reports that he he has been experiencing mild nausea and nonbloody/nonbilious emesis and nonbloody diarrhea for the past few days.  He does note that he has been experiencing intermittent chest pain he cannot describe it he reports his last episode of chest pain was around 3 days ago no current chest pain.  Review of Systems  Positive: Nausea, vomiting, diarrhea.  Chest pain 3 days ago, no active pain Negative: Fall, injury, fever, chills, dysuria/hematuria, extremity swelling/color change or any additional concerns.  Physical Exam  BP 139/82 (BP Location: Left Arm)   Pulse 60   Temp (!) 97.4 F (36.3 C) (Oral)   Resp 18   SpO2 100%  Gen:   Awake, no distress   Resp:  Normal effort  MSK:   Moves extremities without difficulty.  No lower extremity edema Other:  Abdomen soft nontender.  Medical Decision Making  Medically screening exam initiated at 11:09 AM.  Appropriate orders placed.  FREELAND PRACHT was informed that the remainder of the evaluation will be completed by another provider, this initial triage assessment does not replace that evaluation, and the importance of remaining in the ED until their evaluation is complete.   Note: Portions of this report may have been transcribed using voice recognition software. Every effort was made to ensure accuracy; however, inadvertent computerized transcription errors may still be present.    Deliah Boston, PA-C 04/02/21 1111    Lennice Sites, DO 04/02/21 1217

## 2021-04-02 NOTE — Assessment & Plan Note (Signed)
Current medications: cymbalta 35m daily, Wellbutrin 75 mg twice daily  Interim history:  Cymbalta increased to 60 mg during his office visit with Dr. DMarlou Saback in July. Wellbutrin was added at time as well. He moved out of his niece's home and is now currently residing in a shelter.  HPI: He tells me that he has been experiencing intermittent angry outbursts.  He notes that he has not had this happen to him in the past.  After further questioning, it seems to correlate with when he started the Wellbutrin.  I discussed with him the different effects that antidepressants can have on mood.  I recommended we discontinue the bupropion however he remains adamant that he would like to continue on it as he does feel like it has helped his mood.  He has been following with Dr. WTheodis Shove  He also tells me that he has developed a close friendship with one of the women in the shelter and that she provides a great deal of social support to him. Assessment PHQ 9 is 18 today.  It was 26 in July and 9 in August.  He denies thoughts of harming himself.  Given that he is on max dose of Cymbalta and is having some agitation side effects of the bupropion, I offered referral to psychiatry for further management.  Overall, he feels like he is doing well and implies that his mood is incongruent with his PHQ-9 score. Plan Continue Cymbalta 60 mg daily and Wellbutrin 75 mg twice daily per his request We will continue to monitor his symptoms and if they are being inadequately controlled, will continue to recommend referral to psychiatry

## 2021-04-02 NOTE — ED Provider Notes (Signed)
[facility] EMERGENCY DEPARTMENT Provider Note   CSN: [id number] Arrival date & time: 04/02/21  1054     History Chief Complaint  Patient presents with   Abnormal Lab    Allen Gould is a 55 y.o. male history includes obesity, diabetes, GERD, hokum, hypertension, secondary AV block.  Patient reports he was sent in today by his primary care provider for elevated potassium levels.  Patient reports that he has been experiencing mild nausea along with nonbloody/nonbilious emesis and nonbloody diarrhea for the past few days associated with a mild right-sided abdominal pain and intermittent chest pain which last occurred 3 days ago.  He reports he is currently chest pain-free.  He reports his abdominal pain is a mild right-sided ache which is occasional, no clear aggravating or alleviating factors or radiation of pain.  Denies fall/injury, fever, chills, dysuria/hematuria, extremity swelling/color change or any additional concerns. HPI     Past Medical History:  Diagnosis Date   Acquired dilation of ascending aorta and aortic root ([place])    44m by echo 09/2020 but normal on Chest CTA 05/2020   Acute kidney injury ([place] 09/01/2019   Asthma    Balanitis 08/09/2014   Bifascicular block    Cough 08/09/2014   Diabetes mellitus without complication ([place]    Dilated aortic root (HH[place]    4598mon echo 09/2020   DM (diabetes mellitus) type 2, uncontrolled, without ketoacidosis 08/10/2013   Dyslipidemia 03/01/2014   Financial difficulties 08/09/2014   GERD (gastroesophageal reflux disease)    Health care maintenance 03/01/2014   HOCM (hypertrophic obstructive cardiomyopathy) (HCMossyrock   noted on cMRI but no SAM or LVOT gradient   Hypertension    Hypomagnesemia 05/29/2017   Insomnia 08/29/2013   Mitral stenosis    mean MVG 98m35m   Morbid obesity (HCCWashington4/12/2013   Near syncope 05/29/2017   Odontogenic infection of jaw 05/29/2017   PAC (premature atrial contraction)     PAF (paroxysmal atrial fibrillation) (HCC)    Pressure injury of skin 09/02/2019   PVCs (premature ventricular contractions)    Right shoulder pain 08/29/2013   Second degree AV block, Mobitz type I    Sepsis (HCCRavensdale1/10/2017   Sleep apnea    uses cpap    Patient Active Problem List   Diagnosis Date Noted   Flank pain 04/02/2021   Hyperkalemia 04/02/2021   Hypertrophic cardiomyopathy (HCCPleasure Bend1/06/2020   Cardiac pacemaker in situ 03/25/2021   Lipoma 01/15/2021   Normocytic anemia 11/14/2020   Acquired dilation of ascending aorta and aortic root (HCC)    Aortic stenosis    Hip pain 2/2 osteoarthritis 06/12/2020   Tachycardia-bradycardia syndrome (HCCPhilo1/13/2022   (HFpEF) heart failure with preserved ejection fraction (HCCHorizon City1/02/2021   Severe depression (HCCJohnstown2/06/2019   Physical deconditioning 04/24/2020   Second degree AV block, Mobitz type I    Bifascicular block    PAC (premature atrial contraction)    PVCs (premature ventricular contractions)    PAF (paroxysmal atrial fibrillation) (HCCAnsted  AKI (acute kidney injury) (HCCYuba4/02/2020   Essential hypertension 10/22/2016   Health care maintenance 03/01/2014   Dyslipidemia 03/01/2014   Morbid obesity (HCCBlue Grass4/12/2013   Insomnia 08/29/2013   Type II diabetes mellitus (HCCAllen[place]3/20/2015   Asthma    GERD (gastroesophageal reflux disease)     Past Surgical History:  Procedure Laterality Date   APPENDECTOMY     MASS EXCISION N/A 08/19/2020   Procedure: EXCISION OF SCROTAL CYSTS;  Surgeon: Robley Fries, MD;  Location: [facility];  Service: Urology;  Laterality: N/A;  1 HR   PACEMAKER IMPLANT N/A 06/24/2020   Procedure: PACEMAKER IMPLANT;  Surgeon: Vickie Epley, MD;  Location: [facility];  Service: Cardiovascular;  Laterality: N/A;       Family History  Problem Relation Age of Onset   Kidney disease Mother    Diabetes Sister    Hyperlipidemia Sister    Heart disease Other     Social History   Tobacco  Use   Smoking status: Former    Types: Cigarettes    Quit date: 04/24/2005    Years since quitting: 15.9   Smokeless tobacco: Never  Vaping Use   Vaping Use: Never used  Substance Use Topics   Alcohol use: Not Currently    Alcohol/week: 2.0 standard drinks    Types: 2 Cans of beer per week    Comment: Last drink 8 months ago   Drug use: No    Home Medications Prior to Admission medications   Medication Sig Start Date End Date Taking? Authorizing Provider  Accu-Chek Softclix Lancets lancets Check blood sugar 3 times a day 06/11/20   [name], MD  amLODipine (NORVASC) 10 MG tablet Take 1 tablet (10 mg total) by mouth daily. 01/30/21 01/25/22  [name], MD  apixaban (ELIQUIS) 5 MG TABS tablet Take 1 tablet (5 mg total) by mouth 2 (two) times daily. 01/30/21   [name], MD  aspirin 81 MG EC tablet Take 81 mg by mouth daily. Swallow whole.    [provider]  atorvastatin (LIPITOR) 80 MG tablet Take 1 tablet (80 mg total) by mouth daily. 07/28/20   [name], [name], MD  buPROPion (WELLBUTRIN) 75 MG tablet TAKE 1 TABLET (75 MG TOTAL) BY MOUTH 2 (TWO) TIMES DAILY. 03/31/21   [name], MD  clotrimazole-betamethasone (LOTRISONE) cream Apply 1 application topically 2 (two) times daily.    [provider]  Dulaglutide (TRULICITY) 1.5 PJ/0.3PR SOPN INJECT ONE SYRINGE (1.5 MG) UNDER THE SKIN EVERY WEEK 01/23/21   [name], [name], MD  DULoxetine (CYMBALTA) 60 MG capsule TAKE 1 CAPSULE (60 MG TOTAL) BY MOUTH DAILY. 02/26/21 02/26/22  [name], MD  furosemide (LASIX) 40 MG tablet Take 40 mg by mouth every other day.    [provider]  gabapentin (NEURONTIN) 300 MG capsule Take 1 capsule (300 mg total) by mouth in the morning, at noon, and at bedtime. 11/13/20 05/12/21  [name], MD  glucose blood (ACCU-CHEK GUIDE) test strip Check blood sugar 3 times a day 06/11/20   [name], MD  metFORMIN (GLUCOPHAGE) 500 MG tablet Take 1 tablet (500 mg  total) by mouth 2 (two) times daily with a meal. 11/13/20   [name], [name], MD  metoprolol succinate (TOPROL XL) 25 MG 24 hr tablet Take 1 tablet (25 mg total) by mouth at bedtime. 01/30/21   [name], MD  pantoprazole (PROTONIX) 40 MG tablet Take 1 tablet (40 mg total) by mouth daily. 09/25/20 09/25/21  [name], MD  valsartan (DIOVAN) 80 MG tablet TAKE 1 TABLET BY MOUTH DAILY. 03/27/21   [name], MD  vitamin B-12 (CYANOCOBALAMIN) 1000 MCG tablet Take 1,000 mcg by mouth daily.    [provider]    Allergies    Shellfish allergy  Review of Systems   Review of Systems Ten systems are reviewed and are negative for acute change except as noted in the HPI  Physical  Exam Updated Vital Signs BP 130/71 (BP Location: Left Arm)   Pulse (!) 59   Temp (!) 97.4 F (36.3 C) (Oral)   Resp 14   SpO2 100%   Physical Exam Constitutional:      General: He is not in acute distress.    Appearance: Normal appearance. He is well-developed. He is not ill-appearing or diaphoretic.  HENT:     Head: Normocephalic and atraumatic.  Eyes:     General: Vision grossly intact. Gaze aligned appropriately.     Pupils: Pupils are equal, round, and reactive to light.  Neck:     Trachea: Trachea and phonation normal.  Pulmonary:     Effort: Pulmonary effort is normal. No respiratory distress.  Abdominal:     General: There is no distension.     Palpations: Abdomen is soft.     Tenderness: There is no abdominal tenderness. There is no guarding or rebound.  Musculoskeletal:        General: Normal range of motion.     Cervical back: Normal range of motion.     Right lower leg: No edema.     Left lower leg: No edema.  Skin:    General: Skin is warm and dry.  Neurological:     Mental Status: He is alert.     GCS: GCS eye subscore is 4. GCS verbal subscore is 5. GCS motor subscore is 6.     Comments: Speech is clear and goal oriented, follows commands Major Cranial nerves without  deficit, no facial droop Moves extremities without ataxia, coordination intact  Psychiatric:        Behavior: Behavior normal.    ED Results / Procedures / Treatments   Labs (all labs ordered are listed, but only abnormal results are displayed) Labs Reviewed  CBC WITH DIFFERENTIAL/PLATELET - Abnormal; Notable for the following components:      Result Value   RBC 3.27 (*)    Hemoglobin 10.7 (*)    HCT 32.4 (*)    All other components within normal limits  BASIC METABOLIC PANEL - Abnormal; Notable for the following components:   Potassium 5.6 (*)    BUN 34 (*)    Creatinine, Ser 1.62 (*)    GFR, Estimated 49 (*)    All other components within normal limits  MAGNESIUM - Abnormal; Notable for the following components:   Magnesium 1.4 (*)    All other components within normal limits  URINALYSIS, ROUTINE W REFLEX MICROSCOPIC - Abnormal; Notable for the following components:   APPearance HAZY (*)    Leukocytes,Ua MODERATE (*)    Bacteria, UA RARE (*)    Non Squamous Epithelial 0-5 (*)    All other components within normal limits  I-STAT CHEM 8, ED - Abnormal; Notable for the following components:   Potassium 5.7 (*)    BUN 35 (*)    Creatinine, Ser 1.70 (*)    Hemoglobin 11.2 (*)    HCT 33.0 (*)    All other components within normal limits  RESP PANEL BY RT-PCR (FLU A&B, COVID) ARPGX2  URINE CULTURE  HIV ANTIBODY (ROUTINE TESTING W REFLEX)  BASIC METABOLIC PANEL  CBC  TROPONIN I (HIGH SENSITIVITY)    EKG EKG Interpretation  Date/Time:  Thursday April 02 2021 10:54:18 EST Ventricular Rate:  60 PR Interval:  220 QRS Duration: 172 QT Interval:  444 QTC Calculation: 444 R Axis:   268 Text Interpretation: AV dual-paced rhythm with prolonged AV conduction Abnormal ECG Confirmed  by Lennice Sites [phone]) on 04/02/2021 1:29:52 PM  Radiology DG Chest Portable 1 View  Result Date: 04/02/2021 CLINICAL DATA:  55 year old male with history of intermittent chest pain. EXAM:  PORTABLE CHEST 1 VIEW COMPARISON:  Chest x-ray 06/24/2020. FINDINGS: Lung volumes are normal. No consolidative airspace disease. No pleural effusions. No pneumothorax. No pulmonary nodule or mass noted. Pulmonary vasculature and the cardiomediastinal silhouette are within normal limits. Left-sided pacemaker device in place with lead tips projecting over the expected location of the right atrium and right ventricle. IMPRESSION: 1.  No radiographic evidence of acute cardiopulmonary disease. 2. Aortic atherosclerosis. Electronically Signed   By: Vinnie Langton M.D.   On: 04/02/2021 11:20   CT Renal Stone Study  Result Date: 04/02/2021 CLINICAL DATA:  Flank pain, kidney stone suspected; right flank pain EXAM: CT ABDOMEN AND PELVIS WITHOUT CONTRAST TECHNIQUE: Multidetector CT imaging of the abdomen and pelvis was performed following the standard protocol without IV contrast. COMPARISON:  April 2021 FINDINGS: Lower chest: Stable left lower lobe 8 mm nodule (series 5, image 21). Hepatobiliary: No focal liver abnormality is seen. No gallstones, gallbladder wall thickening, or biliary dilatation. Pancreas: Unremarkable. Spleen: Unremarkable. Adrenals/Urinary Tract: Adrenals are unremarkable. No hydronephrosis or renal calculi. Ureters are normal in caliber. Bladder is unremarkable. Stomach/Bowel: Stomach is within normal limits. Bowel is normal in caliber. Post appendectomy. Vascular/Lymphatic: Aortic atherosclerosis. No enlarged lymph nodes. Reproductive: Unremarkable. Other: Unchanged fat containing umbilical hernia.  No free fluid. Musculoskeletal: No acute osseous abnormality. IMPRESSION: No urinary tract calculi or other acute abnormality. Stable left lower lobe 8 mm nodule. Single additional follow-up study in 6 months could be considered if high risk patient. Aortic atherosclerosis. Electronically Signed   By: Macy Mis M.D.   On: 04/02/2021 15:41    Procedures Procedures   Medications Ordered in  ED Medications  sodium zirconium cyclosilicate (LOKELMA) packet 10 g (10 g Oral Given 04/02/21 1555)  acetaminophen (TYLENOL) tablet 650 mg (has no administration in time range)    Or  acetaminophen (TYLENOL) suppository 650 mg (has no administration in time range)  sodium chloride 0.9 % bolus 500 mL (0 mLs Intravenous Stopped 04/02/21 1706)  insulin aspart (novoLOG) injection 5 Units (5 Units Intravenous Given 04/02/21 1510)    And  dextrose 50 % solution 50 mL (50 mLs Intravenous Given 04/02/21 1510)  magnesium sulfate IVPB 1 g 100 mL (0 g Intravenous Stopped 04/02/21 1812)    ED Course  I have reviewed the triage vital signs and the nursing notes.  Pertinent labs & imaging results that were available during my care of the patient were reviewed by me and considered in my medical decision making (see chart for details).  Clinical Course as of 04/02/21 Chari Manning Apr 02, 2021  1724 Dr. Jeffie Pollock [BM]    Clinical Course User Index [BM] Gari Crown   MDM Rules/Calculators/A&P                           Additional history obtained from: Nursing notes from this visit. Review of electronic medical records. --------------------- I ordered, reviewed and interpreted labs which include: High-sensitivity troponin within normal limits, with intermittent chest pain 3 days ago no indication for delta troponin suspicion for ACS. Magnesium slightly low at 1.4, will replete. BMP shows potassium 5.6 which is improving from 6.3 yesterday, kidney function appears to be improving as well with creatinine now at 1.62 compared to 2.39 yesterday, BUN improved  to 34 from 93 yesterday. CBC shows no leukocytosis or thrombocytopenia.  Hemoglobin of 10.7 same as yesterday.  Decreased from 12.3 four months ago. Urinalysis shows 21-50 WBCs, rare bacteria and moderate leukocytes.  Nitrite negative.  Squamous cells negative.  Suspicious for UTI given right side pain.  Will send for culture.  CXR:   IMPRESSION:  1.  No radiographic evidence of acute cardiopulmonary disease.  2. Aortic atherosclerosis.   CT Renal study:  IMPRESSION:  No urinary tract calculi or other acute abnormality.     Stable left lower lobe 8 mm nodule. Single additional follow-up  study in 6 months could be considered if high risk patient.     Aortic atherosclerosis.   EKG: AV dual-paced rhythm with prolonged AV conduction Abnormal ECG Confirmed by Lennice Sites ([id number]) on 04/02/2021 1:29:52 PM  Case was discussed with attending physician Dr. Ronnald Nian, plan is to give patient dextrose/insulin as well as Lokelma for hyperkalemia.  EKG without emergent changes.  Additionally will give fluids and monitor.  Will check BMP and CBG after medications. --- Patient reassessed he is resting comfortably in bed no acute distress updated on findings above and stated understanding. ----------- Given patient this patient follows with internal medicine, consult internal medicine to see if they have any other recommendations or would like to see the patient while he is here.  I spoke with Dr. Jeffie Pollock at 5:25 PM who advises they will come and evaluate the patient. --------  Patient reassessed resting comfortably in bed no acute distress, he has been admitted to internal medicine service.  Note: Portions of this report may have been transcribed using voice recognition software. Every effort was made to ensure accuracy; however, inadvertent computerized transcription errors may still be present.  Final Clinical Impression(s) / ED Diagnoses Final diagnoses:  Hyperkalemia    Rx / DC Orders ED Discharge Orders     None        Deliah Boston, PA-C 04/02/21 [place], Camilla, DO 04/06/21 [phone]

## 2021-04-02 NOTE — H&P (Signed)
Date: 04/02/2021               Patient Name:  Allen Gould MRN: [id number]  DOB: [birth year] Age / Sex: 55 y.o., male   PCP: [name], MD         Medical Service: Internal Medicine Teaching Service         Attending Physician: Dr. Velna Ochs, MD    First Contact: Dr. Buddy Duty Pager: [phone]  Second Contact: Dr. Gaylan Gerold Pager: [phone]       After Hours (After 5p/  First Contact Pager: [phone]  weekends / holidays): Second Contact Pager: [phone]   Chief Complaint: abnormal labs   History of Present Illness: Allen Gould is a 55 yo male with HFpEF, CAD, hypertrophic cardiomyopathy, bifascicular block s/p cardiac pacemaker, morbid obesity, T2DM, HTN, and paroxysmal Afib on Eliquis who presented to the ED after his PCP called him about abnormal lab work. He was advised to return to the clinic for repeat lab work, however, due to transportation difficulties, he elected to seek further evaluation in the ED. Patient was seen in the [facility] yesterday for 1 week of RUQ/R flank pain, and was noted to have a potassium of 6.3 on BMP. He describes having a lot of nausea and vomiting, starting about a month ago. He does have days where he goes without episodes of emesis, but last episode was 1 day ago. Denies any hematemesis or bilious emesis. He also endorses having loose stools over this time period as well, but denies any melena or hematochezia. Because of this, he has had poor oral intake and appetite. The patient did not have any food today, and only had a coffee and some juice. He says he did eat some food the day prior, but overall, has not been eating much. The patient denies any recent sick contacts, recent travel, fevers, chills, chest pain, palpitations, worsening shortness of breath, orthopnea, myalgias, or numbness/tingling. The patient does have dyspnea on exertion, specifically noting that he cannot walk more than 100 yards without getting short of breath and  having to stop, however, this has been ongoing for years. He denies any PND or shortness of breath at rest. The patient also endorses some intermittent dysuria over the past month, but has noticed no hematuria. He has had an increase in urinary frequency, specifically noting that he wakes up about 4 times in the night to urinate, although he is unsure when this first began. He denies any recent antibiotic use or any new changes in his medication regimen and has been adherent with his medications listed below.   Meds:  Amlodipine 10 mg Eliquis 5 mg bid Aspirin 81 mg Lipitor 80 mg Bupropion 75 mg Trulicity 1.5 mg q week Cymbalta 60 mg Lasix 40 mg  Gabapentin 300 mg tid Metformin 500 mg bid Metoprolol 25 mg qhs Protonix 40 mg  Valsartan 80 mg  No outpatient medications have been marked as taking for the 04/02/21 encounter [facility]counter).     Allergies: Allergies as of 04/02/2021 - Review Complete 04/02/2021  Allergen Reaction Noted   Shellfish allergy Hives 08/10/2013   Past Medical History:  Diagnosis Date   Acquired dilation of ascending aorta and aortic root ([place])    21m by echo 09/2020 but normal on Chest CTA 05/2020   Acute kidney injury (HEagar[place] 09/01/2019   Asthma    Balanitis 08/09/2014   Bifascicular block    Cough 08/09/2014   Diabetes mellitus without  complication ([place])    Dilated aortic root (HCC)    38m on echo 09/2020   DM (diabetes mellitus) type 2, uncontrolled, without ketoacidosis 08/10/2013   Dyslipidemia 03/01/2014   Financial difficulties 08/09/2014   GERD (gastroesophageal reflux disease)    Health care maintenance 03/01/2014   HOCM (hypertrophic obstructive cardiomyopathy) (HG[place]    noted on cMRI but no SAM or LVOT gradient   Hypertension    Hypomagnesemia 05/29/2017   Insomnia 08/29/2013   Mitral stenosis    mean MVG 344mg   Morbid obesity (HCBushnell04/12/2013   Near syncope 05/29/2017   Odontogenic infection of jaw 05/29/2017   PAC (premature  atrial contraction)    PAF (paroxysmal atrial fibrillation) (HCC)    Pressure injury of skin 09/02/2019   PVCs (premature ventricular contractions)    Right shoulder pain 08/29/2013   Second degree AV block, Mobitz type I    Sepsis (HCMedon01/10/2017   Sleep apnea    uses cpap    Family History: Kidney disease in mother, hyperlipidemia and diabetes in sister  Social History: Patient resides at a homeless shelter. Independent of his ADLs and IADLs, but does use a shower chair. Ambulates with a rollator. Denies any tobacco, alcohol, or any other drug use.   Review of Systems: A complete ROS was negative except as per HPI.   Physical Exam: Blood pressure 130/71, pulse (!) 59, temperature (!) 97.4 F (36.3 C), temperature source Oral, resp. rate 14, SpO2 100 %.  General: Pleasant, well-appearing obese male laying in bed. No acute distress. Head: Normocephalic. Atraumatic. CV: RRR. No murmurs, rubs, or gallops. Trace edema in bilateral lower extremities.  Pulmonary: Lungs CTAB. Normal effort. No wheezing or rales. Abdominal: Soft, nontender, nondistended. Normal bowel sounds. Umbilical hernia is reducible.  Extremities: Palpable radial and DP pulses. Normal ROM. Skin: Warm and dry. No obvious rash or lesions. Neuro: A&Ox3. No focal deficit. Psych: Normal mood and affect   EKG: personally reviewed my interpretation is no changes compared to prior EKG; AV dual placed rhythm. No peaked T waves  CXR: personally reviewed my interpretation is no active cardiopulmonary disease  Assessment & Plan by Problem: Active Problems:   Hyperkalemia  #Hyperkalemia [facility]. He has transportation difficulties and was unable to do this, and thus was recommended to seek evaluation in the ED. He does endorse nausea and vomiting that has been going on for a while, and poor oral intake. Denies chest  pain, palpitations, myalgias, numbness/tingling, or any other s/s of hyperkalemia. Potassium in the ED was 5.6 and the patient received insulin/D50 for hyperkalemia. Patient did not have any EKG changes noted and did not receive calcium gluconate. Troponin level within normal limits.  - Lokelma 10 g - Continue to monitor potassium (10 pm and AM labs) - Holding home valsartan  - Cardiac monitoring   #AKI Patient [facility], with Cr 2.39, with baseline around 1.5-1.7. Today, Cr is improved to 1.62. AKI likely prerenal in nature secondary to GI losses. CT renal study performed and showed no urinary tract calculi or other acute abnormalities. - Encourage oral intake - Monitor BMP - Avoid nephrotoxic agents - Hold home metformin   #Hypomagnesemia Mag low at 1.4 in the ED. Repleted with magnesium sulfate IVPB. - Recheck Mg in AM  #HFpEF #Hypertrophic cardiomyopathy #Afib Cardiomyopathy is thought to be  secondary to phenotypic hypertrophy. Patient also has a history of Afib, CHADVASC 3 (on Eliquis). Blood pressure has been well controlled on norvasc, Toprol XL, valsartan, and lasix. Patient is scheduled for a stress echo to evaluate for HOCM.  - Resume Eliquis, norvasc  - Holding Valsartan in the setting of hyperkalemia - Holding metoprolol   #T2DM Patient's blood sugars have been well controlled and A1c 6.0. On Trulicity 1.5 mg once weekly and metformin 500 mg bid at home, however, will hold these medications given AKI and unclear timing of last Trulicity dose. - SSI  - Gabapentin 300 mg tid  #Incidental lung nodule Stable left lower lobe 8 mm nodule noted on CT renal study. Follow up in 6 months recommended if patient is thought to be high risk.    Best practices: Code: DNR DVT: Therapeutic Eliquis Fluids: None Diet: Carb modified Dispo: Admit patient to Observation with expected length of stay less than 2 midnights.  SignedDorethea Clan,  DO 04/02/2021, 6:20 PM  Pager: [phone] After 5pm on weekdays and 1pm on weekends: On Call pager: [phone]

## 2021-04-02 NOTE — Assessment & Plan Note (Signed)
Current medications: Amlodipine 10 mg daily, metoprolol succinate 25 mg daily, valsartan 80 mg daily, Lasix 40 mg every other day  Blood pressure is at goal in the office today. BP Readings from Last 3 Encounters:  04/01/21 115/70  03/25/21 112/68  02/10/21 132/80   Denies issues with dizziness, lightheadedness or orthostasis. BMP shows a significant increase in BUN/creatinine as well as hyperkalemia to 6.3. Plan  Continue metoprolol succinate 15m daily, amlodipine 171mdaily.   Hold valsartan and Lasix  Repeat BMP

## 2021-04-02 NOTE — Progress Notes (Signed)
Internal Medicine Clinic Attending  Case discussed with Dr. [name]  At the time of the visit.  We reviewed the resident's history and exam and pertinent patient test results.  I agree with the assessment, diagnosis, and plan of care documented in the resident's note.  

## 2021-04-03 ENCOUNTER — Other Ambulatory Visit (HOSPITAL_COMMUNITY): Payer: Self-pay

## 2021-04-03 ENCOUNTER — Encounter (HOSPITAL_COMMUNITY): Payer: Self-pay | Admitting: Internal Medicine

## 2021-04-03 DIAGNOSIS — N179 Acute kidney failure, unspecified: Secondary | ICD-10-CM

## 2021-04-03 DIAGNOSIS — E875 Hyperkalemia: Secondary | ICD-10-CM

## 2021-04-03 LAB — CBC
HCT: 32.3 % — ABNORMAL LOW (ref 39.0–52.0)
Hemoglobin: 10.7 g/dL — ABNORMAL LOW (ref 13.0–17.0)
MCH: 32.4 pg (ref 26.0–34.0)
MCHC: 33.1 g/dL (ref 30.0–36.0)
MCV: 97.9 fL (ref 80.0–100.0)
Platelets: 205 10*3/uL (ref 150–400)
RBC: 3.3 MIL/uL — ABNORMAL LOW (ref 4.22–5.81)
RDW: 13.2 % (ref 11.5–15.5)
WBC: 9 10*3/uL (ref 4.0–10.5)
nRBC: 0 % (ref 0.0–0.2)

## 2021-04-03 LAB — IRON AND TIBC
Iron: 77 ug/dL (ref 45–182)
Saturation Ratios: 28 % (ref 17.9–39.5)
TIBC: 280 ug/dL (ref 250–450)
UIBC: 203 ug/dL

## 2021-04-03 LAB — BASIC METABOLIC PANEL
Anion gap: 7 (ref 5–15)
BUN: 26 mg/dL — ABNORMAL HIGH (ref 6–20)
CO2: 24 mmol/L (ref 22–32)
Calcium: 9.4 mg/dL (ref 8.9–10.3)
Chloride: 105 mmol/L (ref 98–111)
Creatinine, Ser: 1.3 mg/dL — ABNORMAL HIGH (ref 0.61–1.24)
GFR, Estimated: >60 mL/min (ref >60)
Glucose, Bld: 100 mg/dL — ABNORMAL HIGH (ref 70–99)
Potassium: 4.4 mmol/L (ref 3.5–5.1)
Sodium: 136 mmol/L (ref 135–145)

## 2021-04-03 LAB — GLUCOSE, CAPILLARY
Glucose-Capillary: 88 mg/dL (ref 70–99)
Glucose-Capillary: 99 mg/dL (ref 70–99)

## 2021-04-03 LAB — HIV ANTIBODY (ROUTINE TESTING W REFLEX): HIV Screen 4th Generation wRfx: NONREACTIVE

## 2021-04-03 LAB — MAGNESIUM: Magnesium: 1.5 mg/dL — ABNORMAL LOW (ref 1.7–2.4)

## 2021-04-03 LAB — FERRITIN: Ferritin: 179 ng/mL (ref 24–336)

## 2021-04-03 MED ORDER — MAGNESIUM SULFATE 2 GM/50ML IV SOLN
2.0000 g | Freq: Once | INTRAVENOUS | Status: AC
  Administered 2021-04-03: 2 g via INTRAVENOUS
  Filled 2021-04-03: qty 50

## 2021-04-03 MED ORDER — TRULICITY 0.75 MG/0.5ML ~~LOC~~ SOAJ
0.7500 mg | SUBCUTANEOUS | 0 refills | Status: AC
  Filled 2021-04-03 (×3): qty 2, 28d supply, fill #0

## 2021-04-03 NOTE — Discharge Summary (Signed)
Name: Allen Gould MRN: [id number] DOB: 07/10/1965 55 y.o. PCP: [name], MD  Date of Admission: 04/02/2021 10:57 AM Date of Discharge:  04/03/2021 Attending Physician: Dr. Philipp Ovens  DISCHARGE DIAGNOSIS:  Primary Problem: Hyperkalemia   Hospital Problems: Principal Problem:   Hyperkalemia    DISCHARGE MEDICATIONS:   Allergies as of 04/03/2021       Reactions   Shellfish Allergy Hives        Medication List     STOP taking these medications    Trulicity 1.5 LD/3.5TS Sopn Generic drug: Dulaglutide Replaced by: Trulicity 1.77 LT/9.0ZE Sopn       TAKE these medications    Accu-Chek Guide test strip Generic drug: glucose blood Check blood sugar 3 times a day   Accu-Chek Softclix Lancets lancets Check blood sugar 3 times a day   amLODipine 10 MG tablet Commonly known as: NORVASC Take 1 tablet (10 mg total) by mouth daily.   apixaban 5 MG Tabs tablet Commonly known as: ELIQUIS Take 1 tablet (5 mg total) by mouth 2 (two) times daily.   aspirin 81 MG EC tablet Take 81 mg by mouth daily. Swallow whole.   atorvastatin 80 MG tablet Commonly known as: LIPITOR Take 1 tablet (80 mg total) by mouth daily.   buPROPion 75 MG tablet Commonly known as: WELLBUTRIN TAKE 1 TABLET (75 MG TOTAL) BY MOUTH 2 (TWO) TIMES DAILY.   clotrimazole-betamethasone cream Commonly known as: LOTRISONE Apply 1 application topically 2 (two) times daily.   DULoxetine 60 MG capsule Commonly known as: CYMBALTA TAKE 1 CAPSULE (60 MG TOTAL) BY MOUTH DAILY.   furosemide 40 MG tablet Commonly known as: LASIX Take 40 mg by mouth every other day.   gabapentin 300 MG capsule Commonly known as: NEURONTIN Take 1 capsule (300 mg total) by mouth in the morning, at noon, and at bedtime.   metFORMIN 500 MG tablet Commonly known as: GLUCOPHAGE Take 1 tablet (500 mg total) by mouth 2 (two) times daily with a meal.   metoprolol succinate 25 MG 24 hr tablet Commonly known as:  Toprol XL Take 1 tablet (25 mg total) by mouth at bedtime.   pantoprazole 40 MG tablet Commonly known as: Protonix Take 1 tablet (40 mg total) by mouth daily.   Trulicity 0.92 ZR/0.0TM Sopn Generic drug: Dulaglutide Inject 0.75 mg into the skin once a week. Replaces: Trulicity 1.5 AU/6.3FH Sopn   valsartan 80 MG tablet Commonly known as: DIOVAN TAKE 1 TABLET BY MOUTH DAILY.   vitamin B-12 1000 MCG tablet Commonly known as: CYANOCOBALAMIN Take 1,000 mcg by mouth daily.        DISPOSITION AND FOLLOW-UP:  Allen Gould was discharged from [facility] in Stable condition. At the hospital follow up visit please address:  Follow-up Recommendations: Consults: none Labs: Basic Metabolic Profile and Magnesium Studies: None Medications: STOP Trulicity 1.5 mg and start 0.75 mg (vomiting/diarrhea started around time of increase of this dosage and thus, likely from higher Trulicity dose)  Follow-up Appointments:  Follow-up Information     [name], MD Follow up.   Specialty: Internal Medicine Why: Clinic will call you with an appt Contact information: [address] [place] [address] [phone]                 HOSPITAL COURSE:  Patient Summary: #Hyperkalemia Potassium elevated to 6.3 in the [facility] yesterday and the patient was called to return to the [facility] for repeat lab work. He  has transportation difficulties and was unable to do this, and thus was recommended to seek evaluation in the ED. He does endorse nausea and vomiting that has been going on for a while, and poor oral intake. Denies chest pain, palpitations, myalgias, numbness/tingling, or any other s/s of hyperkalemia. Potassium in the ED was 5.6 and the patient received insulin/D50 for hyperkalemia, as well as lokelma. Patient did not have any EKG changes noted and did not receive calcium gluconate. Troponin level within normal limits. K on the day of discharge was  within norma limits and the patient was feeling well. Hyperkalemia was thought to be secondary to this patient's AKI, and could have also possibly been exacerbated by his valsartan. Advised patient to stop his valsartan when he has episodes of vomiting/diarrhea, and to seek evaluation with his PCP.   #AKI #GI losses  Patient noted to have an AKI at the [facility] on 11/9, with Cr 2.39, with baseline around 1.5-1.7. On admission, Cr is improved to 1.62. AKI likely prerenal in nature secondary to GI losses. CT renal study performed and showed no urinary tract calculi or other acute abnormalities. Cr improved beyond the patient's baseline on day of discharge to 1.3. Vomiting/diarrhea could have been secondary to the patient's recent dosage increase of his Trulicity. Will decrease this dose and recommend he follow up with his PCP.   #Hypomagnesemia Mag low at 1.4 in the ED. Repleted with magnesium sulfate IVPB. Recommend follow up of lab work with PCP.    #HFpEF #Hypertrophic cardiomyopathy #Afib Cardiomyopathy is thought to be secondary to phenotypic hypertrophy. Patient also has a history of Afib, CHADVASC 3 (on Eliquis). Blood pressure has been well controlled on norvasc, Toprol XL, valsartan, and lasix. Patient is scheduled for a stress echo to evaluate for HOCM. Resumed home meds on discharge.  #T2DM Patient's blood sugars have been well controlled and A1c 6.0. On Trulicity 1.5 mg once weekly and metformin 500 mg bid at home, however, will hold these medications given AKI and unclear timing of last Trulicity dose. Will decrease Trulicity dose on discharge given the patient's vomiting and diarrhea.     DISCHARGE INSTRUCTIONS:  FOLLOW-UP INSTRUCTIONS:  Thank you for allowing [place] to be part of your care. You were hospitalized for Hyperkalemia   Please follow up with the following providers: A. [name], MD, [address] [place] [address], [phone]   Please note these  changes made to your medications:   A. Medications to start/note the dose change: Current Meds  Medication Sig   Dulaglutide (TRULICITY) 6.38 LH/7.3SK SOPN Inject 0.75 mg into the skin once a week.      B. Medications to continue:    amLODipine (NORVASC) tablet 10 mg, 10 mg, Oral, Daily   apixaban (ELIQUIS) tablet 5 mg, 5 mg, Oral, BID   aspirin EC tablet 81 mg, 81 mg, Oral, Daily   atorvastatin (LIPITOR) tablet 80 mg, 80 mg, Oral, Daily   buPROPion (WELLBUTRIN) tablet 75 mg, 75 mg, Oral, BID   DULoxetine (CYMBALTA) DR capsule 60 mg, 60 mg, Oral, Daily   furosemide (LASIX) tablet 40 mg, 40 mg, Oral, QODAY   gabapentin (NEURONTIN) capsule 300 mg, 300 mg, Oral, TID,    pantoprazole (PROTONIX) EC tablet 40 mg, 40 mg, Oral, Daily,    C. Medications to discontinue:  Trulicity 1.5 mg    Please make sure to stop taking your valsartan if you have further episodes of vomiting and diarrhea, and call your  primary care physician so you can be seen in the clinic.   Please call our clinic if you have any questions or concerns, we may be able to help and keep you from a long and expensive emergency room wait. Our clinic and after hours phone number is [phone], the best time to call is Monday through Friday 9 am to 4 pm but there is always someone available 24/7 if you have an emergency. If you need medication refills please notify your pharmacy one week in advance and they will send [place] a request.       SUBJECTIVE:  Allen Gould was seen and evaluated on the day of discharge. He says that he feels wonderful today and is stable to be discharged. He denies any abdominal pain and has no further nausea/vomiting. Also has not had any further episodes of diarrhea.   Discharge Vitals:   BP (!) 120/57 (BP Location: Left Arm)   Pulse 60   Temp 98.2 F (36.8 C) (Oral)   Resp 18   Ht 5' 10"  (1.778 m)   Wt 134.7 kg   SpO2 100%   BMI 42.60 kg/m   OBJECTIVE:  General: Pleasant,  well-appearing obese male laying in bed. No acute distress. Head: Normocephalic. Atraumatic. CV: RRR. No murmurs, rubs, or gallops. No LE edema Pulmonary: Lungs CTAB. Normal effort. No wheezing or rales. Abdominal: Soft, nontender, nondistended. Normal bowel sounds. Umbilical hernia is reducible.  Extremities: Palpable radial and DP pulses. Normal ROM. Skin: Warm and dry. No obvious rash or lesions. Neuro: A&Ox3. No focal deficit. Psych: Normal mood and affect     Pertinent Labs, Studies, and Procedures:  CBC Latest Ref Rng & Units 04/03/2021 04/02/2021 04/02/2021  WBC 4.0 - 10.5 K/uL 9.0 - 8.2  Hemoglobin 13.0 - 17.0 g/dL 10.7(L) 11.2(L) 10.7(L)  Hematocrit 39.0 - 52.0 % 32.3(L) 33.0(L) 32.4(L)  Platelets 150 - 400 K/uL 205 - 196    CMP Latest Ref Rng & Units 04/03/2021 04/02/2021 04/02/2021  Glucose 70 - 99 mg/dL 100(H) - 86  BUN 6 - 20 mg/dL 26(H) - 35(H)  Creatinine 0.61 - 1.24 mg/dL 1.30(H) - 1.70(H)  Sodium 135 - 145 mmol/L 136 - 140  Potassium 3.5 - 5.1 mmol/L 4.4 4.7 5.7(H)  Chloride 98 - 111 mmol/L 105 - 109  CO2 22 - 32 mmol/L 24 - -  Calcium 8.9 - 10.3 mg/dL 9.4 - -  Total Protein 6.0 - 8.5 g/dL - - -  Total Bilirubin 0.0 - 1.2 mg/dL - - -  Alkaline Phos 44 - 121 IU/L - - -  AST 0 - 40 IU/L - - -  ALT 0 - 44 IU/L - - -    DG Chest Portable 1 View  Result Date: 04/02/2021 CLINICAL DATA:  55 year old male with history of intermittent chest pain. EXAM: PORTABLE CHEST 1 VIEW COMPARISON:  Chest x-ray 06/24/2020. FINDINGS: Lung volumes are normal. No consolidative airspace disease. No pleural effusions. No pneumothorax. No pulmonary nodule or mass noted. Pulmonary vasculature and the cardiomediastinal silhouette are within normal limits. Left-sided pacemaker device in place with lead tips projecting over the expected location of the right atrium and right ventricle. IMPRESSION: 1.  No radiographic evidence of acute cardiopulmonary disease. 2. Aortic atherosclerosis.  Electronically Signed   By: Vinnie Langton M.D.   On: 04/02/2021 11:20   CT Renal Stone Study  Result Date: 04/02/2021 CLINICAL DATA:  Flank pain, kidney stone suspected; right flank pain EXAM: CT ABDOMEN AND PELVIS WITHOUT  CONTRAST TECHNIQUE: Multidetector CT imaging of the abdomen and pelvis was performed following the standard protocol without IV contrast. COMPARISON:  April 2021 FINDINGS: Lower chest: Stable left lower lobe 8 mm nodule (series 5, image 21). Hepatobiliary: No focal liver abnormality is seen. No gallstones, gallbladder wall thickening, or biliary dilatation. Pancreas: Unremarkable. Spleen: Unremarkable. Adrenals/Urinary Tract: Adrenals are unremarkable. No hydronephrosis or renal calculi. Ureters are normal in caliber. Bladder is unremarkable. Stomach/Bowel: Stomach is within normal limits. Bowel is normal in caliber. Post appendectomy. Vascular/Lymphatic: Aortic atherosclerosis. No enlarged lymph nodes. Reproductive: Unremarkable. Other: Unchanged fat containing umbilical hernia.  No free fluid. Musculoskeletal: No acute osseous abnormality. IMPRESSION: No urinary tract calculi or other acute abnormality. Stable left lower lobe 8 mm nodule. Single additional follow-up study in 6 months could be considered if high risk patient. Aortic atherosclerosis. Electronically Signed   By: Macy Mis M.D.   On: 04/02/2021 15:41     Signed: Buddy Duty, D.O.  Internal Medicine Resident, PGY-1 [facility]  Pager: [phone] 12:21 PM, 04/03/2021

## 2021-04-03 NOTE — Care Management (Signed)
Consult for medication assistance, patient has medicaid and is ineligible for assistance or MATCH.

## 2021-04-03 NOTE — Discharge Instructions (Addendum)
FOLLOW-UP INSTRUCTIONS:  Thank you for allowing [place] to be part of your care. You were hospitalized for Hyperkalemia   Please follow up with the following providers: A. [name], MD, [address] [place] [address], [phone]   Please note these changes made to your medications:   A. Medications to start/note the dose change: Current Meds  Medication Sig   Dulaglutide (TRULICITY) 2.59 DG/3.8VF SOPN Inject 0.75 mg into the skin once a week.      B. Medications to start:    amLODipine (NORVASC) tablet 10 mg, 10 mg, Oral, Daily, Araceli Arango N, DO, 10 mg at 04/03/21 0911   apixaban (ELIQUIS) tablet 5 mg, 5 mg, Oral, BID, Lovel Suazo N, DO, 5 mg at 04/03/21 0911   aspirin EC tablet 81 mg, 81 mg, Oral, Daily, Danny Zimny N, DO, 81 mg at 04/03/21 0911   atorvastatin (LIPITOR) tablet 80 mg, 80 mg, Oral, Daily, Christyana Corwin N, DO, 80 mg at 04/03/21 0911   buPROPion (WELLBUTRIN) tablet 75 mg, 75 mg, Oral, BID, Lashun Mccants N, DO, 75 mg at 04/03/21 6433   DULoxetine (CYMBALTA) DR capsule 60 mg, 60 mg, Oral, Daily, Scherrie Seneca N, DO, 60 mg at 04/03/21 0911   furosemide (LASIX) tablet 40 mg, 40 mg, Oral, QODAY, Miray Mancino N, DO, 40 mg at 04/02/21 1929   gabapentin (NEURONTIN) capsule 300 mg, 300 mg, Oral, TID, Niveah Boerner N, DO, 300 mg at 04/03/21 0911   pantoprazole (PROTONIX) EC tablet 40 mg, 40 mg, Oral, Daily, Tywana Robotham N, DO, 40 mg at 04/03/21 0911   C. Medications to discontinue:  Trulicity 1.5 mg     Please make sure to stop taking your valsartan if you have further episodes of vomiting and diarrhea, and call your primary care physician so you can be seen in the clinic.   Please call our clinic if you have any questions or concerns, we may be able to help and keep you from a long and expensive emergency room wait. Our clinic and after hours phone number is [phone], the best time to call is Monday through Friday 9 am to 4 pm but there is  always someone available 24/7 if you have an emergency. If you need medication refills please notify your pharmacy one week in advance and they will send [place] a request.

## 2021-04-03 NOTE — Social Work (Addendum)
[name] provided pt with a bus pass and offered community resources and resources for homeless shelters. Pt refused as he is already living in the [employer]. [name] provided pt with taxi voucher. [name] signing off.

## 2021-04-04 LAB — URINE CULTURE: Culture: >=100000 — AB

## 2021-04-06 ENCOUNTER — Telehealth: Payer: Self-pay | Admitting: Internal Medicine

## 2021-04-06 DIAGNOSIS — N390 Urinary tract infection, site not specified: Secondary | ICD-10-CM | POA: Insufficient documentation

## 2021-04-06 DIAGNOSIS — N3000 Acute cystitis without hematuria: Secondary | ICD-10-CM

## 2021-04-06 MED ORDER — CEFDINIR 300 MG PO CAPS: 300.0000 mg | ORAL_CAPSULE | Freq: Two times a day (BID) | ORAL | 0 refills | Status: DC

## 2021-04-06 NOTE — Assessment & Plan Note (Addendum)
Urine cultures from recent admission are growing >100k of proteus, 50k GBS. I spoke with Gertie Exon today who notes that his right flank pain has persisted so will treat as a complicated UTI although not clear cut. -10d course of cefdinir sent to the pharmacy -he will arrange to be seen in the clinic later this week for hospital follow up

## 2021-04-06 NOTE — Telephone Encounter (Signed)
Attempted to contact pt to discuss urine culture results and to follow up on symptoms. No answer x2, VM left to return call to the clinic.  If right flank pain has persisted, would recommend antibiotic treatment for complicated UTI with 29K course of cefdinir.   Addendum 4:49 PM : Spoke with Allen Gould. He notes persistent right flank pain. No fever or chills. I updated him on the urine culture results. Will send in for a 10d course of cefdinir. He understands to call if symptoms worsen, if he develops fever or if symptoms do not improve with treatment.

## 2021-04-07 ENCOUNTER — Telehealth (HOSPITAL_COMMUNITY): Payer: Self-pay | Admitting: *Deleted

## 2021-04-07 NOTE — Telephone Encounter (Signed)
Patient given detailed instructions per Stress Test Requisition Sheet for test on 04/13/21 at 2:00.Patient Notified to arrive 30 minutes early, and that it is imperative to arrive on time for appointment to keep from having the test rescheduled.  Patient verbalized understanding. Allen Gould

## 2021-04-08 ENCOUNTER — Encounter: Payer: Self-pay | Admitting: Physician Assistant

## 2021-04-08 ENCOUNTER — Other Ambulatory Visit: Payer: Self-pay | Admitting: Critical Care Medicine

## 2021-04-08 ENCOUNTER — Other Ambulatory Visit (HOSPITAL_COMMUNITY): Payer: Self-pay

## 2021-04-08 MED ORDER — CEFDINIR 300 MG PO CAPS
300.0000 mg | ORAL_CAPSULE | Freq: Two times a day (BID) | ORAL | 0 refills | Status: DC
  Filled 2021-04-08 – 2021-04-14 (×2): qty 20, 10d supply, fill #0

## 2021-04-08 NOTE — Progress Notes (Signed)
Pt seen by Dr Joya Gaskins  Was hospitalized with K+ 6.3 . Mg low as well. Renal function a little worse, but improved by discharge.  Pt w/ UTI and urine cx + Proteus mirabilis, ABX were sent in by PCP but he was not aware of this and has not picked them up.   He had been having vomiting and diarrhea, lost 14 lbs. Those sx have resolved. IM felt the sx were due Trulicity >> dose reduced.   Pt CBG was 80 this am, did not check at lunch. Is feeling weak, but no presyncope.  Seen by Cards 11/02, MV planned for Monday.  146/87, HR 64  CBG 71 >> given cup of oranges, has 2 small cookies and had some Snickers bars recently.    Still having vomiting at times, had an episode this am, vomited up undigested food from the night before. Yellowish part of the vomitus and also stools are yellowish.   He has a PCP appt soon, he needs to f/u on this.  Will be given a pill organizer, Bonnita Nasuti will help with this tomorrow.   Rosaria Ferries, PA-C 04/08/2021 4:58 PM

## 2021-04-13 ENCOUNTER — Other Ambulatory Visit (HOSPITAL_COMMUNITY): Payer: Medicaid Other

## 2021-04-14 ENCOUNTER — Other Ambulatory Visit (HOSPITAL_COMMUNITY): Payer: Self-pay

## 2021-04-15 ENCOUNTER — Encounter: Payer: Self-pay | Admitting: Internal Medicine

## 2021-04-15 ENCOUNTER — Other Ambulatory Visit (HOSPITAL_COMMUNITY): Payer: Self-pay

## 2021-04-15 ENCOUNTER — Other Ambulatory Visit: Payer: Self-pay

## 2021-04-15 ENCOUNTER — Ambulatory Visit (INDEPENDENT_AMBULATORY_CARE_PROVIDER_SITE_OTHER): Payer: Medicaid Other | Admitting: Internal Medicine

## 2021-04-15 VITALS — BP 143/87 | HR 68 | Temp 97.8°F | Ht 70.0 in | Wt 303.8 lb

## 2021-04-15 DIAGNOSIS — I10 Essential (primary) hypertension: Secondary | ICD-10-CM | POA: Diagnosis not present

## 2021-04-15 DIAGNOSIS — E875 Hyperkalemia: Secondary | ICD-10-CM | POA: Diagnosis not present

## 2021-04-15 DIAGNOSIS — M25559 Pain in unspecified hip: Secondary | ICD-10-CM

## 2021-04-15 DIAGNOSIS — N179 Acute kidney failure, unspecified: Secondary | ICD-10-CM | POA: Diagnosis not present

## 2021-04-15 DIAGNOSIS — F322 Major depressive disorder, single episode, severe without psychotic features: Secondary | ICD-10-CM | POA: Diagnosis not present

## 2021-04-15 DIAGNOSIS — N3000 Acute cystitis without hematuria: Secondary | ICD-10-CM | POA: Diagnosis not present

## 2021-04-15 MED ORDER — CEFDINIR 300 MG PO CAPS
300.0000 mg | ORAL_CAPSULE | Freq: Two times a day (BID) | ORAL | 0 refills | Status: AC
Start: 2021-04-15 — End: 2021-04-25

## 2021-04-15 MED ORDER — BUPROPION HCL ER (SR) 150 MG PO TB12: 150.0000 mg | ORAL_TABLET | Freq: Two times a day (BID) | ORAL | 0 refills | Status: DC

## 2021-04-15 NOTE — Progress Notes (Signed)
CC: Hyperkalemia; AKI; hip pain; depression  HPI:  [name] is a 55 y.o. with a PMHx as listed below who presents to the clinic for Hyperkalemia; AKI; hip pain; depression.   Please see the Encounters tab for problem-based Assessment & Plan regarding status of patient's acute and chronic conditions.  Past Medical History:  Diagnosis Date   Acquired dilation of ascending aorta and aortic root ([place])    66m by echo 09/2020 but normal on Chest CTA 05/2020   Acute kidney injury ([facility] 09/01/2019   Asthma    Balanitis 08/09/2014   Bifascicular block    Cough 08/09/2014   Diabetes mellitus without complication ([facility]    Dilated aortic root ([facility]    416mon echo 09/2020   DM (diabetes mellitus) type 2, uncontrolled, without ketoacidosis 08/10/2013   Dyslipidemia 03/01/2014   Financial difficulties 08/09/2014   GERD (gastroesophageal reflux disease)    Health care maintenance 03/01/2014   HOCM (hypertrophic obstructive cardiomyopathy) ([facility]   noted on cMRI but no SAM or LVOT gradient   Hypertension    Hypomagnesemia 05/29/2017   Insomnia 08/29/2013   Mitral stenosis    mean MVG 52m55m   Morbid obesity ([facility]/12/2013   Near syncope 05/29/2017   Odontogenic infection of jaw 05/29/2017   PAC (premature atrial contraction)    PAF (paroxysmal atrial fibrillation) (H[facility])    Pressure injury of skin 09/02/2019   PVCs (premature ventricular contractions)    Right shoulder pain 08/29/2013   Second degree AV block, Mobitz type I    Sepsis ([facility]1/10/2017   Sleep apnea    uses cpap   Review of Systems: Review of Systems  Constitutional:  Negative for chills and fever.  Respiratory:  Negative for cough and shortness of breath.   Cardiovascular:  Negative for chest pain and leg swelling.  Gastrointestinal:  Negative for abdominal pain, blood in stool, constipation, diarrhea, melena, nausea and vomiting.  Genitourinary:  Positive for dysuria and flank pain. Negative for  frequency, hematuria and urgency.  Musculoskeletal:  Positive for joint pain (bilateral hip).  Skin:  Negative for itching and rash.  Neurological:  Negative for dizziness and headaches.  Psychiatric/Behavioral:  Positive for depression and suicidal ideas. Negative for hallucinations and substance abuse. The patient is not nervous/anxious and does not have insomnia.    Physical Exam:  Vitals:   04/15/21 1019  BP: (!) 143/87  Pulse: 68  Temp: 97.8 F (36.6 C)  TempSrc: Oral  SpO2: 90%  Weight: (!) 303 lb 12.8 oz (137.8 kg)  Height: 5' 10"  (1.778 m)   Physical Exam Vitals and nursing note reviewed.  Constitutional:      General: He is not in acute distress.    Appearance: He is obese.  HENT:     Head: Normocephalic and atraumatic.  Eyes:     Extraocular Movements: Extraocular movements intact.     Pupils: Pupils are equal, round, and reactive to light.  Cardiovascular:     Rate and Rhythm: Normal rate and regular rhythm.     Heart sounds: No murmur heard. Pulmonary:     Effort: Pulmonary effort is normal. No respiratory distress.     Breath sounds: No wheezing, rhonchi or rales.  Abdominal:     General: Bowel sounds are normal.     Palpations: Abdomen is soft.     Tenderness: There is right CVA tenderness. There is no left CVA tenderness.  Musculoskeletal:  General: Tenderness (bilateral hip pain along the iliac crest) present.     Right lower leg: Edema (trace pitting) present.     Left lower leg: Edema (trace pitting) present.  Skin:    General: Skin is warm and dry.  Neurological:     General: No focal deficit present.     Mental Status: He is alert and oriented to person, place, and time. Mental status is at baseline.  Psychiatric:        Attention and Perception: Attention normal.        Mood and Affect: Mood is depressed.        Speech: Speech normal.        Behavior: Behavior normal. Behavior is cooperative.        Thought Content: Thought content is  not delusional. Thought content includes suicidal (passive) ideation. Thought content does not include homicidal ideation. Thought content does not include homicidal or suicidal plan.        Cognition and Memory: Cognition and memory normal.        Judgment: Judgment normal.    Assessment & Plan:   See Encounters Tab for problem based charting.  Patient discussed with Dr. Heber Franklin

## 2021-04-15 NOTE — Patient Instructions (Addendum)
It was nice seeing you today! Thank you for choosing [facility] for your Primary Care.    Today we talked about:   We will recheck your magnesium, potassium and kidney function today  Depression: Contact the [facility] to ask for additional counseling sessions We are increasing your Wellbutrin to 150 mg twice a day Continue the current dose of Duloxetine Please consider going to the ER if you feel scared that you may harm yourself.   Blood Pressure: This is likely affected by your stress levels currently. We will focus on that first before we change your medications   Hip Pain: Contact your orthopedic surgeon to ask for their soonest appointment for injections Stop Ibuprofen. It is very high risk for you to take this medication. It can worsen your blood pressure and cause worsening kidney failure Start Tylenol 1000 mg every 6 to 8 hours. This will not affect your blood pressure   Pain with Urination:  I have resent the antibiotic called Cefdinir to [facility]

## 2021-04-16 LAB — BMP8+ANION GAP
Anion Gap: 16 mmol/L (ref 10.0–18.0)
BUN/Creatinine Ratio: 15 (ref 9–20)
BUN: 21 mg/dL (ref 6–24)
CO2: 20 mmol/L (ref 20–29)
Calcium: 9.8 mg/dL (ref 8.7–10.2)
Chloride: 102 mmol/L (ref 96–106)
Creatinine, Ser: 1.37 mg/dL — ABNORMAL HIGH (ref 0.76–1.27)
Glucose: 88 mg/dL (ref 70–99)
Potassium: 5.2 mmol/L (ref 3.5–5.2)
Sodium: 138 mmol/L (ref 134–144)
eGFR: 60 mL/min/{1.73_m2} (ref >59)

## 2021-04-16 LAB — MAGNESIUM: Magnesium: 1.6 mg/dL (ref 1.6–2.3)

## 2021-04-19 NOTE — Assessment & Plan Note (Signed)
Recent admission for hyperkalemia in the setting of AKI.  Patient was treated with Lokelma.  - Repeat BMP

## 2021-04-19 NOTE — Assessment & Plan Note (Signed)
BP: (!) 143/87  Allen Gould states that he is taking amlodipine, metoprolol, and valsartan daily without any difficulty.  He is concerned that his blood pressure today is higher than it normally is.  Assessment/plan: Blood pressure above goal at this time.  We discussed focusing on his mental health for now before making any medication adjustments.  We will recheck at next visit and consider medication titration at that time if additional blood pressure control is needed.  -Continue amlodipine, metoprolol, and valsartan

## 2021-04-19 NOTE — Assessment & Plan Note (Signed)
Allen Gould states that he had developed right-sided flank pain and dysuria over the last several days.  He is aware that antibiotics were called in for him but he was never able to pick them up.  He is unsure which pharmacy was sent to.  Assessment/plan: Recent urine culture growing Proteus mirabilis.  Patient's PCP has sent in a prescription for cefdinir.    - Resent cefdinir to patient's preferred pharmacy.

## 2021-04-19 NOTE — Assessment & Plan Note (Signed)
Patient had a recent admission for hyperkalemia with new onset AKI in the setting of GI illness.  During hospitalization, he was treated with IV fluids and Lokelma.  Today, he denies any GI symptoms at this time.  However he does endorse taking ibuprofen 600 mg twice daily.  We discussed extensively that this is very high risk given his history and current ARB use.  - Recheck BMP - Strongly encourage patient's stop NSAID use immediately

## 2021-04-19 NOTE — Assessment & Plan Note (Signed)
Allen Gould states that he continues to have severe depression and at times does contemplate hurting himself.  He denies any suicidal plans at this time. He denies exposure to weapons.  He denies having any close friends or family with whom he can confide in.  He has started therapy with the [facility] and finds his sessions helpful; unfortunately, he does not feel like he is having sessions frequent enough. He is currently taking Wellbutrin and Duloxetine daily.   Assessment/plan: PHQ-9 markedly elevated today at 76 with thoughts of suicidal ideation but no plan.  Difficult to contract for safety however no indication at this time for admission.  We will continue uptitrating his Wellbutrin given he is at a low dose.  I strongly encouraged that he contact the [facility] and request additional sessions.  - Increase Wellbutrin to 150 mg twice daily - Continue duloxetine 60 mg daily - Increase therapy sessions at the [facility] with PCP in 4 weeks

## 2021-04-19 NOTE — Assessment & Plan Note (Signed)
Allen Gould states that he has a long-term history of bilateral hip osteoarthritis that is worse on the right lately.  He notes that he had previously followed with orthopedic surgery and had injections.  These injections did give him good relief of his pain.  Currently, he is only taking ibuprofen twice daily for pain relief.  He has not tried Tylenol.  Assessment/plan: Examination consistent with osteoarthritis.  - Discontinue ibuprofen use due to recent AKI and current ARB use - Start Tylenol as needed for pain - Encouraged he contact his orthopedic surgeon for consideration of additional injections

## 2021-04-20 NOTE — Progress Notes (Signed)
Renal function essentially unchanged from 11/11.  Suspect it had not improved further due to ibuprofen use.  Patient's potassium was also on the high end of normal at 5.2.  I contacted Mr. Amick and strongly urged that he stop ibuprofen use ASAP.  He has a follow-up appointment scheduled in 4 weeks, at which time BMP should be rechecked.  Magnesium shows improvement up to 1.6.

## 2021-04-21 ENCOUNTER — Telehealth: Payer: Medicaid Other

## 2021-04-21 NOTE — Progress Notes (Signed)
Internal Medicine Clinic Attending  Case discussed with Dr. Charleen Kirks    At the time of the visit.  We reviewed the resident's history and exam and pertinent patient test results.  I agree with the assessment, diagnosis, and plan of care documented in the resident's note.

## 2021-04-23 ENCOUNTER — Ambulatory Visit: Payer: Medicaid Other | Admitting: Licensed Clinical Social Worker

## 2021-04-23 NOTE — Chronic Care Management (AMB) (Signed)
  Care Management   Social Work Visit Note  04/23/2021 Name: KAMIN NIBLACK MRN: [id number] DOB: [birth year]  [name] is a 55 y.o. year old male who sees [name], MD for primary care. The care management team was consulted for assistance with care management and care coordination needs related to [facility] Resources    Patient was given the following information about care management and care coordination services today, agreed to services, and gave verbal consent: 1.care management/care coordination services include personalized support from designated clinical staff supervised by their physician, including individualized plan of care and coordination with other care providers 2. 24/7 contact phone numbers for assistance for urgent and routine care needs. 3. The patient may stop care management/care coordination services at any time by phone call to the office staff.  Engaged with patient by telephone for follow up visit in response to provider referral for social work chronic care management and care coordination services.  Assessment: Review of patient history, allergies, and health status during evaluation of patient need for care management/care coordination services.    Interventions:  Patient interviewed and appropriate assessments performed Collaborated with clinical team regarding patient needs  Patient is currently residing at [facility]. Patient has not been given a date to when he needs to leave shelter. Patient has a therapist he sees at [facility]. Patient has court on 01/05 regarding disability application.  Patient denied needing food or transportation assistance.       Plan:  patient will work with BSW to address needs related to Housing barriers Hydrographic surveyor will follow up within the next 30 days.   [name], [place]  Social Worker [facility] Care Management  [phone]

## 2021-04-23 NOTE — Patient Instructions (Signed)
Visit Information  Instructions: patient will work with SW to address concerns related to housing.  Patient was given the following information about care management and care coordination services today, agreed to services, and gave verbal consent: 1.care management/care coordination services include personalized support from designated clinical staff supervised by their physician, including individualized plan of care and coordination with other care providers 2. 24/7 contact phone numbers for assistance for urgent and routine care needs. 3. The patient may stop care management/care coordination services at any time by phone call to the office staff.  Patient verbalizes understanding of instructions provided today and agrees to view in [place].   The care management team will reach out to the patient again over the next 30 days.  [name], [place]  Social Worker [facility] Care Management  [phone]

## 2021-04-28 ENCOUNTER — Ambulatory Visit: Payer: Medicaid Other | Admitting: Behavioral Health

## 2021-04-28 DIAGNOSIS — F419 Anxiety disorder, unspecified: Secondary | ICD-10-CM

## 2021-04-28 DIAGNOSIS — F331 Major depressive disorder, recurrent, moderate: Secondary | ICD-10-CM

## 2021-04-28 NOTE — BH Specialist Note (Signed)
Integrated Behavioral Health via Telemedicine Visit  04/28/2021 JSON KOELZER [id number]  Number of [place] visits: 7 Session Start time: 1:50pm  Session End time: 2:20pm Total time: 30  Referring Provider: Dr. Morrison Old, DO Patient/Family location: Pt is on the [facility] in private [facility] Provider location: [facility] Office All persons participating in visit: Pt & Clinician Types of Service: Individual psychotherapy  I connected with Allen Gould and/or Allen Gould's  self  via  Animal nutritionist  (Video is Tree surgeon) and verified that I am speaking with the correct person using two identifiers. Discussed confidentiality:  7th visit  I discussed the limitations of telemedicine and the availability of in person appointments.  Discussed there is a possibility of technology failure and discussed alternative modes of communication if that failure occurs.  I discussed that engaging in this telemedicine visit, they consent to the provision of behavioral healthcare and the services will be billed under their insurance.  Patient and/or legal guardian expressed understanding and consented to Telemedicine visit: Yes   Presenting Concerns: Patient and/or family reports the following symptoms/concerns: elevated anx/dep due to the circumstances of homelessness Duration of problem: 3 months; Severity of problem: moderate  Patient and/or Family's Strengths/Protective Factors: Concrete supports in place (healthy food, safe environments, etc.)  Goals Addressed: Patient will:  Reduce symptoms of: anxiety, depression, and stress   Increase knowledge and/or ability of: coping skills and stress reduction   Demonstrate ability to: Increase healthy adjustment to current life circumstances, Increase adequate support systems for patient/family, and Begin healthy grieving over loss  Progress towards  Goals: Ongoing  Interventions: Interventions utilized:  Solution-Focused Strategies and Supportive Counseling Standardized Assessments completed:  screeners prn  Patient and/or Family Response: Pt requests call on Mon-RC on Tue @ 1:50pm.  Assessment: Patient currently experiencing elevated anx/dep due to circumstances of homelessness & poverty.   Pt has Stress Test & Atty call on 05/11/21. Pt's Disability Hearing is on 05/28/20.   Patient may benefit from cont'd services via psychotherapy & coping skills.  Plan: Follow up with behavioral health clinician on : 2-3 wks on telehealth for 30 min Behavioral recommendations: None today Referral(s): [place] (In Clinic)  I discussed the assessment and treatment plan with the patient and/or parent/guardian. They were provided an opportunity to ask questions and all were answered. They agreed with the plan and demonstrated an understanding of the instructions.   They were advised to call back or seek an in-person evaluation if the symptoms worsen or if the condition fails to improve as anticipated.  Donnetta Hutching, LMFT

## 2021-04-29 ENCOUNTER — Other Ambulatory Visit: Payer: Self-pay | Admitting: Internal Medicine

## 2021-04-29 ENCOUNTER — Telehealth: Payer: Self-pay

## 2021-04-29 ENCOUNTER — Encounter: Payer: Self-pay | Admitting: Physician Assistant

## 2021-04-29 DIAGNOSIS — E119 Type 2 diabetes mellitus without complications: Secondary | ICD-10-CM

## 2021-04-29 NOTE — Telephone Encounter (Signed)
Left message requesting call back regarding medication costs.  Call back [phone]
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
## 2021-04-29 NOTE — Telephone Encounter (Signed)
After review Trulicity was discontinued at discharge from hospital and not removed from med list. Is patient still on medication?

## 2021-04-29 NOTE — Telephone Encounter (Signed)
Pt returned phone call  Pt says he takes about 12 medications that total up to almost $50 at [place]. Informed pt he could use [facility] pharmacy for his medications and place them on a charge account that he would be responsible to make payments on. Pt says he is currently doing this at [facility] and wants to clear his bill there before opening one somewhere else. Pt will reach out to me or pharmacy at the beginning of the year to transfer his meds. He will also be going to court to establish disability

## 2021-04-29 NOTE — Telephone Encounter (Signed)
He is on 0.80m

## 2021-05-04 ENCOUNTER — Encounter (HOSPITAL_COMMUNITY): Payer: Self-pay | Admitting: *Deleted

## 2021-05-04 ENCOUNTER — Telehealth (HOSPITAL_COMMUNITY): Payer: Self-pay | Admitting: *Deleted

## 2021-05-04 NOTE — Telephone Encounter (Signed)
Reminder letter sent via [employer] outlining instructions for upcoming stress echocardiogram on 05/11/21.

## 2021-05-05 ENCOUNTER — Telehealth: Payer: Self-pay | Admitting: Internal Medicine

## 2021-05-05 NOTE — Telephone Encounter (Signed)
°  Pt states he is feeling more "Aggressive" since starting his new dosage of the following medication and needs someone to call him back as soon as possible.   buPROPion (WELLBUTRIN SR) 150 MG 12 hr tablet

## 2021-05-05 NOTE — Telephone Encounter (Signed)
Pt calling back to provide a number to reach him at.  Please call him back at [phone]

## 2021-05-05 NOTE — Telephone Encounter (Signed)
Return pt's call. 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 I called phone # on chart - no answer; left message to call the office.

## 2021-05-06 ENCOUNTER — Encounter: Payer: Self-pay | Admitting: *Deleted

## 2021-05-06 NOTE — Telephone Encounter (Signed)
Called pt again - no answer; left message on self-identified vm to call the office. I will also send pt a MY Chart message. He has an appt in [place] with Dr Margreta Journey 05/20/21.

## 2021-05-07 ENCOUNTER — Emergency Department (HOSPITAL_COMMUNITY)
Admission: EM | Admit: 2021-05-07 | Discharge: 2021-05-07 | Disposition: A | Discharge status: Home / Self Care | Payer: Medicare Other | Attending: Emergency Medicine | Admitting: Emergency Medicine

## 2021-05-07 ENCOUNTER — Other Ambulatory Visit: Payer: Self-pay

## 2021-05-07 ENCOUNTER — Telehealth: Payer: Self-pay | Admitting: Internal Medicine

## 2021-05-07 ENCOUNTER — Telehealth: Payer: Self-pay

## 2021-05-07 ENCOUNTER — Encounter (HOSPITAL_COMMUNITY): Payer: Self-pay

## 2021-05-07 ENCOUNTER — Telehealth: Payer: Self-pay | Admitting: Cardiology

## 2021-05-07 ENCOUNTER — Telehealth: Payer: Medicaid Other | Admitting: Internal Medicine

## 2021-05-07 ENCOUNTER — Emergency Department (HOSPITAL_COMMUNITY): Payer: Medicare Other

## 2021-05-07 DIAGNOSIS — I110 Hypertensive heart disease with heart failure: Secondary | ICD-10-CM | POA: Diagnosis not present

## 2021-05-07 DIAGNOSIS — Z7984 Long term (current) use of oral hypoglycemic drugs: Secondary | ICD-10-CM | POA: Insufficient documentation

## 2021-05-07 DIAGNOSIS — I509 Heart failure, unspecified: Secondary | ICD-10-CM | POA: Diagnosis not present

## 2021-05-07 DIAGNOSIS — Z7901 Long term (current) use of anticoagulants: Secondary | ICD-10-CM | POA: Insufficient documentation

## 2021-05-07 DIAGNOSIS — J45909 Unspecified asthma, uncomplicated: Secondary | ICD-10-CM | POA: Diagnosis not present

## 2021-05-07 DIAGNOSIS — R079 Chest pain, unspecified: Secondary | ICD-10-CM

## 2021-05-07 DIAGNOSIS — Z950 Presence of cardiac pacemaker: Secondary | ICD-10-CM | POA: Insufficient documentation

## 2021-05-07 DIAGNOSIS — Z7982 Long term (current) use of aspirin: Secondary | ICD-10-CM | POA: Insufficient documentation

## 2021-05-07 DIAGNOSIS — E119 Type 2 diabetes mellitus without complications: Secondary | ICD-10-CM | POA: Insufficient documentation

## 2021-05-07 DIAGNOSIS — Z79899 Other long term (current) drug therapy: Secondary | ICD-10-CM | POA: Diagnosis not present

## 2021-05-07 DIAGNOSIS — R0789 Other chest pain: Secondary | ICD-10-CM | POA: Diagnosis present

## 2021-05-07 DIAGNOSIS — F322 Major depressive disorder, single episode, severe without psychotic features: Secondary | ICD-10-CM

## 2021-05-07 DIAGNOSIS — Z87891 Personal history of nicotine dependence: Secondary | ICD-10-CM | POA: Diagnosis not present

## 2021-05-07 LAB — CBC WITH DIFFERENTIAL/PLATELET
Abs Immature Granulocytes: 0.02 10*3/uL (ref 0.00–0.07)
Basophils Absolute: 0 10*3/uL (ref 0.0–0.1)
Basophils Relative: 0 %
Eosinophils Absolute: 0.2 10*3/uL (ref 0.0–0.5)
Eosinophils Relative: 2 %
HCT: 36.4 % — ABNORMAL LOW (ref 39.0–52.0)
Hemoglobin: 12.1 g/dL — ABNORMAL LOW (ref 13.0–17.0)
Immature Granulocytes: 0 %
Lymphocytes Relative: 19 %
Lymphs Abs: 1.5 10*3/uL (ref 0.7–4.0)
MCH: 33.5 pg (ref 26.0–34.0)
MCHC: 33.2 g/dL (ref 30.0–36.0)
MCV: 100.8 fL — ABNORMAL HIGH (ref 80.0–100.0)
Monocytes Absolute: 0.8 10*3/uL (ref 0.1–1.0)
Monocytes Relative: 9 %
Neutro Abs: 5.8 10*3/uL (ref 1.7–7.7)
Neutrophils Relative %: 70 %
Platelets: 222 10*3/uL (ref 150–400)
RBC: 3.61 MIL/uL — ABNORMAL LOW (ref 4.22–5.81)
RDW: 13.3 % (ref 11.5–15.5)
WBC: 8.3 10*3/uL (ref 4.0–10.5)
nRBC: 0 % (ref 0.0–0.2)

## 2021-05-07 LAB — TROPONIN I (HIGH SENSITIVITY)
Troponin I (High Sensitivity): 10 ng/L (ref <18)
Troponin I (High Sensitivity): 9 ng/L (ref <18)

## 2021-05-07 LAB — BASIC METABOLIC PANEL
Anion gap: 7 (ref 5–15)
BUN: 30 mg/dL — ABNORMAL HIGH (ref 6–20)
CO2: 25 mmol/L (ref 22–32)
Calcium: 10 mg/dL (ref 8.9–10.3)
Chloride: 102 mmol/L (ref 98–111)
Creatinine, Ser: 1.77 mg/dL — ABNORMAL HIGH (ref 0.61–1.24)
GFR, Estimated: 44 mL/min — ABNORMAL LOW (ref >60)
Glucose, Bld: 79 mg/dL (ref 70–99)
Potassium: 4.6 mmol/L (ref 3.5–5.1)
Sodium: 134 mmol/L — ABNORMAL LOW (ref 135–145)

## 2021-05-07 NOTE — ED Triage Notes (Signed)
BIB [facility] from home. Pt has had chest pressure and tightness for past week. Pain 6/10. Pt states chest pain moves around chest. Hx: Afib, HTN. 324 aspirin po prior to arrival.   148/96 60 heart rate 100% room air

## 2021-05-07 NOTE — ED Provider Notes (Signed)
[facility] EMERGENCY DEPARTMENT Provider Note   CSN: [id number] Arrival date & time: 05/07/21  1526     History Chief Complaint  Patient presents with   Chest Pain    Allen Gould is a 55 y.o. male.   Chest Pain Associated symptoms: no abdominal pain, no back pain, no shortness of breath and no weakness   Patient presents with anterior chest pain.  On the anterior lower chest.  Sharp.  Starts on left chest and moves to the right.  Comes on with stress.  Not with exertion but states he does not really exert himself much.  No fevers.  No coughing.  Is not feel his heart racing.  Does have a pacemaker.  No trouble breathing.  Has had episodes for the last few days.    Past Medical History:  Diagnosis Date   Acquired dilation of ascending aorta and aortic root ([place])    74m by echo 09/2020 but normal on Chest CTA 05/2020   Acute kidney injury ([place]ems[place] 09/01/2019   Asthma    Balanitis 08/09/2014   Bifascicular block    Cough 08/09/2014   Diabetes mellitus without complication ([place]    Dilated aortic root ([place]    41mon echo 09/2020   DM (diabetes mellitus) type 2, uncontrolled, without ketoacidosis 08/10/2013   Dyslipidemia 03/01/2014   Financial difficulties 08/09/2014   GERD (gastroesophageal reflux disease)    Health care maintenance 03/01/2014   HOCM (hypertrophic obstructive cardiomyopathy) (HCRedstone   noted on cMRI but no SAM or LVOT gradient   Hypertension    Hypomagnesemia 05/29/2017   Insomnia 08/29/2013   Mitral stenosis    mean MVG 53m71m   Morbid obesity (HCCBergholz4/12/2013   Near syncope 05/29/2017   Odontogenic infection of jaw 05/29/2017   PAC (premature atrial contraction)    PAF (paroxysmal atrial fibrillation) (HCC)    Pressure injury of skin 09/02/2019   PVCs (premature ventricular contractions)    Right shoulder pain 08/29/2013   Second degree AV block, Mobitz type I    Sepsis (HCCJohnson1/10/2017   Sleep apnea    uses cpap    Patient  Active Problem List   Diagnosis Date Noted   UTI (urinary tract infection) 04/06/2021   Flank pain 04/02/2021   Hyperkalemia 04/02/2021   Hypertrophic cardiomyopathy (HCCAmesti1/06/2020   Cardiac pacemaker in situ 03/25/2021   Lipoma 01/15/2021   Normocytic anemia 11/14/2020   Acquired dilation of ascending aorta and aortic root (HCC)    Aortic stenosis    Hip pain 2/2 osteoarthritis 06/12/2020   Tachycardia-bradycardia syndrome (HCCVermilion1/13/2022   (HFpEF) heart failure with preserved ejection fraction (HCCBlanchard1/02/2021   Severe depression (HCCGillett Grove2/06/2019   Physical deconditioning 04/24/2020   Second degree AV block, Mobitz type I    Bifascicular block    PAC (premature atrial contraction)    PVCs (premature ventricular contractions)    PAF (paroxysmal atrial fibrillation) (HCCCape Charles  AKI (acute kidney injury) (HCCCataract4/02/2020   Essential hypertension 10/22/2016   Health care maintenance 03/01/2014   Dyslipidemia 03/01/2014   Morbid obesity (HCCBrooktree Park4/12/2013   Insomnia 08/29/2013   Type II diabetes mellitus (HCCLapel3/20/2015   Asthma    GERD (gastroesophageal reflux disease)     Past Surgical History:  Procedure Laterality Date   APPENDECTOMY     MASS EXCISION N/A 08/19/2020   Procedure: EXCISION OF SCROTAL CYSTS;  Surgeon: PacRobley FriesD;  Location: [facility];  Service:  Urology;  Laterality: N/A;  1 HR   PACEMAKER IMPLANT N/A 06/24/2020   Procedure: PACEMAKER IMPLANT;  Surgeon: Vickie Epley, MD;  Location: [place] CV LAB;  Service: Cardiovascular;  Laterality: N/A;       Family History  Problem Relation Age of Onset   Kidney disease Mother    Diabetes Sister    Hyperlipidemia Sister    Heart disease Other     Social History   Tobacco Use   Smoking status: Former    Types: Cigarettes    Quit date: 04/24/2005    Years since quitting: 16.0   Smokeless tobacco: Never  Vaping Use   Vaping Use: Never used  Substance Use Topics   Alcohol use: Not Currently     Alcohol/week: 2.0 standard drinks    Types: 2 Cans of beer per week    Comment: Last drink 8 months ago   Drug use: No    Home Medications Prior to Admission medications   Medication Sig Start Date End Date Taking? Authorizing Provider  Accu-Chek Softclix Lancets lancets Check blood sugar 3 times a day 06/11/20  Yes [name], MD  acetaminophen (TYLENOL) 500 MG tablet Take 500 mg by mouth every 6 (six) hours as needed (pain).   Yes [provider]  amLODipine (NORVASC) 10 MG tablet Take 1 tablet (10 mg total) by mouth daily. 01/30/21 01/25/22 Yes Turner, Eber Hong, MD  apixaban (ELIQUIS) 5 MG TABS tablet Take 1 tablet (5 mg total) by mouth 2 (two) times daily. 01/30/21  Yes [name], MD  aspirin 81 MG EC tablet Take 81 mg by mouth daily. Swallow whole.   Yes [provider]  atorvastatin (LIPITOR) 80 MG tablet Take 1 tablet (80 mg total) by mouth daily. 07/28/20  Yes [name], [name], MD  buPROPion (WELLBUTRIN SR) 150 MG 12 hr tablet Take 1 tablet (150 mg total) by mouth 2 (two) times daily. 04/15/21 04/15/22 Yes Jose Persia, MD  clotrimazole-betamethasone (LOTRISONE) cream Apply 1 application topically 2 (two) times daily.   Yes [provider]  DULoxetine (CYMBALTA) 60 MG capsule TAKE 1 CAPSULE (60 MG TOTAL) BY MOUTH DAILY. 02/26/21 02/26/22 Yes [name], [name], MD  furosemide (LASIX) 40 MG tablet Take 40 mg by mouth every other day.   Yes [provider]  gabapentin (NEURONTIN) 300 MG capsule Take 1 capsule (300 mg total) by mouth in the morning, at noon, and at bedtime. 11/13/20 05/12/21 Yes [name], [name], MD  glucose blood (ACCU-CHEK GUIDE) test strip Check blood sugar 3 times a day 06/11/20  Yes [name], MD  metFORMIN (GLUCOPHAGE) 500 MG tablet Take 1 tablet (500 mg total) by mouth 2 (two) times daily with a meal. 11/13/20  Yes [name], [name], MD  metoprolol succinate (TOPROL XL) 25 MG 24 hr tablet Take 1 tablet (25 mg total) by mouth at  bedtime. 01/30/21  Yes Turner, Eber Hong, MD  pantoprazole (PROTONIX) 40 MG tablet Take 1 tablet (40 mg total) by mouth daily. 09/25/20 09/25/21 Yes [name], [name], MD  TRULICITY 1.5 JO/8.7OM SOPN Inject into the skin once a week. 04/29/21  Yes [provider]  valsartan (DIOVAN) 80 MG tablet TAKE 1 TABLET BY MOUTH DAILY. Patient taking differently: Take 80 mg by mouth daily. 03/27/21  Yes [name], [name], MD  vitamin B-12 (CYANOCOBALAMIN) 1000 MCG tablet Take 1,000 mcg by mouth daily.   Yes [provider]    Allergies    Shellfish allergy  Review of Systems  Review of Systems  Constitutional:  Negative for appetite change.  HENT:  Negative for congestion.   Respiratory:  Negative for shortness of breath.   Cardiovascular:  Positive for chest pain.  Gastrointestinal:  Negative for abdominal pain.  Genitourinary:  Negative for flank pain.  Musculoskeletal:  Negative for back pain.  Skin:  Negative for rash.  Neurological:  Negative for weakness.   Physical Exam Updated Vital Signs BP (!) 135/93    Pulse (!) 57    Temp (!) 97.4 F (36.3 C) (Oral)    Resp 19    Ht 5' 10"  (1.778 m)    Wt (!) 137.4 kg    SpO2 100%    BMI 43.48 kg/m   Physical Exam Vitals and nursing note reviewed.  HENT:     Head: Atraumatic.  Cardiovascular:     Rate and Rhythm: Regular rhythm.  Pulmonary:     Breath sounds: No rales.  Chest:     Chest wall: Tenderness present.     Comments: Tenderness over anterior mid chest.  No crepitance.  No rash. Abdominal:     Tenderness: There is no abdominal tenderness.  Musculoskeletal:     Cervical back: Normal range of motion.     Right lower leg: No edema.     Left lower leg: No edema.  Skin:    Capillary Refill: Capillary refill takes less than 2 seconds.  Neurological:     Mental Status: He is alert.    ED Results / Procedures / Treatments   Labs (all labs ordered are listed, but only abnormal results are displayed) Labs Reviewed  CBC  WITH DIFFERENTIAL/PLATELET - Abnormal; Notable for the following components:      Result Value   RBC 3.61 (*)    Hemoglobin 12.1 (*)    HCT 36.4 (*)    MCV 100.8 (*)    All other components within normal limits  BASIC METABOLIC PANEL - Abnormal; Notable for the following components:   Sodium 134 (*)    BUN 30 (*)    Creatinine, Ser 1.77 (*)    GFR, Estimated 44 (*)    All other components within normal limits  TROPONIN I (HIGH SENSITIVITY)  TROPONIN I (HIGH SENSITIVITY)    EKG None  Radiology DG Chest Portable 1 View  Result Date: 05/07/2021 CLINICAL DATA:  Chest pain. Additional history provided: Patient reports chest pain for 1 week, shortness of breath. EXAM: PORTABLE CHEST 1 VIEW COMPARISON:  Prior chest radiographs 04/02/2021 and earlier. FINDINGS: Shallow inspiration radiograph. Redemonstrated left chest dual lead implantable cardiac device. Mild cardiomegaly, unchanged. No appreciable airspace consolidation or pulmonary edema. No evidence of pleural effusion or pneumothorax. No acute bony abnormality identified. IMPRESSION: Shallow inspiration radiograph. No evidence of acute cardiopulmonary abnormality. Mild cardiomegaly, unchanged. Electronically Signed   By: Kellie Simmering D.O.   On: 05/07/2021 16:36    Procedures Procedures   Medications Ordered in ED Medications - No data to display  ED Course  I have reviewed the triage vital signs and the nursing notes.  Pertinent labs & imaging results that were available during my care of the patient were reviewed by me and considered in my medical decision making (see chart for details).    MDM Rules/Calculators/A&P                         Patient presents with chest pain.  Sharp.  Comes and goes.  Not exertional.  States does  come on with stress.  EKG reviewed and reassuring.  Troponin negative x2.  X-ray reassuring.  Reproducible on the chest.  Doubt cardiac ischemia as a as the cause.  Does have some arrhythmia history.  Is  scheduled for stress test here in the next few days.  Appears stable for discharge doubt pulmonary embolism    Final Clinical Impression(s) / ED Diagnoses Final diagnoses:  Nonspecific chest pain    Rx / DC Orders ED Discharge Orders     None        Davonna Belling, MD 05/07/21 2004

## 2021-05-07 NOTE — Telephone Encounter (Addendum)
Allen Gould called our clinic on 12/13, reporting he was feeling more " aggressive" since starting a higher dose of bupropion.   Recent history  He had initially been started on bupropion in July due to persistent depression despite duloxetine 60 mg.  PHQ-9 was 26 at that time.  He was started on 75 mg bupropion twice daily at that visit.  He followed up in our clinic in August at which time his PHQ-9 had dramatically improved to 8.  He had followed up with me in the clinic on 11/9 at which time he had mentioned that he had noticed himself having more agitation since starting the Wellbutrin.  I recommended that we discontinue the bupropion however he felt that it was helping his depression and that he could tolerate the slight increase in agitation and wish to continue it.  PHQ-9 was 18.  He was seen in the [facility] by one of my colleagues 11/23 at which time he had mentioned some suicidal ideation although did not have any particular plan.  PHQ-9 at that visit was 27.  Bupropion dose was increased to 150 mg twice daily.  Telephone visit I called and spoke with Allen Gould today.  He notes that he has been having a lot more agitation since starting the higher dose of bupropion.  He expresses frustration regarding his current housing issues, noting that the shelter keeps accusing him of having bedbugs and took his Rollator away for sanitation, telling him that he would get this back for 2 weeks. He says he has not been in a physical altercations although he implies that he could get to this.  He does not have any particular plans and just says that people are getting on his nerves.  He denied any suicidal ideation or plans to me. He is also stressed because he has an upcoming court date regarding his disability.  He tells me he does not want to mess anything up that would affect this.  He was not able to be more specific in terms of what he meant.  Assessment: I am familiar with Allen Gould as he is one of my clinic  patients and him and I have visited on the number of visits.  Based on our discussion today, I can tell that he is more agitated than usual. Based on the time course of his symptoms and the activating nature of bupropion, I suspect that it is attributable to his symptoms, in combination with these life stressors he is facing.   I recommended that we decrease his dose of bupropion back to 75 mg however he is resistant to this.  I offered to send in the 75 mg tablets however he declined.  He has an upcoming office visit with me on 12/28, at which time he would like to further discuss this.  Plan - I will plan to continue to encourage him to decrease the bupropion back down to 75mg  when he sees me on 12/28.  I would like to try to transition him to an alternative SSRI/SNRI such as zoloft, since it seems that the 60 mg of duloxetine is not working sufficiently.  If he is strongly resistant to trying an alternative medication, we can increase his duloxetine to 120 mg although studies suggest that doses above 60 mg are not more beneficial for depression treatment.  We may need to consider getting him over to psychiatry to assist with medication management as well. - He is aware that he needs to seek immediate medical attention via  the ER or urgent [facility] should he develop a suicidal or homicidal plan  Case reviewed with Dr. Jimmye Norman  Of additional note, I reviewed the telephone encounter he had with cardiology this morning, at which time he reported 2 weeks of intermittent chest pain and palpitations. I spoke with him briefly about this as well. He attributes it to the stress he is experiencing. I agree with the recommendations from the cardiology office that he needs further evaluation in the ED. I expressed the importance of this however he continued to decline.

## 2021-05-07 NOTE — Discharge Instructions (Signed)
Your kidney function looks slightly worse than before.  Follow-up with your doctor for further management of her medications for it.

## 2021-05-07 NOTE — Telephone Encounter (Signed)
Pt c/o of Chest Pain: STAT if CP now or developed within 24 hours  1. Are you having CP right now? No, was having this morning   2. Are you experiencing any other symptoms (ex. SOB, nausea, vomiting, sweating)? Palpitations, nausea, pain at pacemaker sight & lost of appetite   3. How long have you been experiencing CP?  2 weeks   4. Is your CP continuous or coming and going? Coming and going   5. Have you taken Nitroglycerin? No  ?

## 2021-05-07 NOTE — Assessment & Plan Note (Addendum)
Allen Gould called our clinic on 12/13, reporting he was feeling more " aggressive" since starting a higher dose of bupropion.   Recent history  He had initially been started on bupropion in July due to persistent depression despite duloxetine 60 mg.  PHQ-9 was 26 at that time.  He was started on 75 mg bupropion twice daily at that visit.  He followed up in our clinic in August at which time his PHQ-9 had dramatically improved to 8.  He had followed up with me in the clinic on 11/9 at which time he had mentioned that he had noticed himself having more agitation since starting the Wellbutrin.  I recommended that we discontinue the bupropion however he felt that it was helping his depression and that he could tolerate the slight increase in agitation and wish to continue it.  PHQ-9 was 18.  He was seen in the [facility] by one of my colleagues 11/23 at which time he had mentioned some suicidal ideation although did not have any particular plan.  PHQ-9 at that visit was 27.  Bupropion dose was increased to 150 mg twice daily.  Telephone visit I called and spoke with [facility] today.  He notes that he has been having a lot more agitation since starting the higher dose of bupropion.  He expresses frustration regarding his current housing issues, noting that the shelter keeps accusing him of having bedbugs and took his Rollator away for sanitation, telling him that he would get this back for 2 weeks. He says he has not been in a physical altercations although he implies that he could get to this.  He does not have any particular plans and just says that people are getting on his nerves.  He denied any suicidal ideation or plans to me. He is also stressed because he has an upcoming court date regarding his disability.  He tells me he does not want to mess anything up that would affect this.  He was not able to be more specific in terms of what he meant.  Assessment: I am familiar with Allen Gould as he is one of my clinic  patients and him and I have visited on the number of visits.  Based on our discussion today, I can tell that he is more agitated than usual. Based on the time course of his symptoms and the activating nature of bupropion, I suspect that it is attributable to his symptoms, in combination with these life stressors he is facing.   I recommended that we decrease his dose of bupropion back to 75 mg however he is resistant to this.  I offered to send in the 75 mg tablets however he declined.  He has an upcoming office visit with me on 12/28, at which time he would like to further discuss this.  Plan - I will plan to continue to encourage him to decrease the bupropion back down to 75mg  when he sees me on 12/28.  I would like to try to transition him to an alternative SSRI/SNRI such as zoloft, since it seems that the 60 mg of duloxetine is not working sufficiently.  If he is strongly resistant to trying an alternative medication, we can increase his duloxetine to 120 mg although studies suggest that doses above 60 mg are not more beneficial for depression treatment.  We may need to consider getting him over to psychiatry to assist with medication management as well. - He is aware that he needs to seek immediate medical attention via  the ER or urgent care should he develop a suicidal or homicidal plan  Case reviewed with Dr. Jimmye Norman

## 2021-05-07 NOTE — Telephone Encounter (Signed)
Patient called back and corrected his number.  [phone].  Pt states he is so sorry he gave the wrong number. Pt state he really needs some help as soon as possible about his behavorial medications making him more aggressive.

## 2021-05-07 NOTE — Telephone Encounter (Signed)
Pt is requesting a call back .. he stated that where he is staying they have took his walking device and he is a fall risk.. he stated that he stays at the [facility] and the management took it  about ten mins ago  .[name] pt is scared he is going to fall

## 2021-05-07 NOTE — Telephone Encounter (Signed)
Returned call to patient. States he cannot come in but is willing to have tele appt today. Appt given with Dr. Court Joy at 1:15. Please call [phone].

## 2021-05-07 NOTE — Telephone Encounter (Signed)
Spoke with the patient who reports that he has been having intermittent sharp chest pain for the past couple of weeks. He states that he has been under a lot of stress lately and pain comes when he gets really stressed. He states that pain is in the middle of his chest. He states that he has also been dealing with a lot of nausea and loss of appetite. He reports that he throws up almost every morning. He also reports that he has been having palpitations as well. He states that he is unable to plug in his machine to send over a transmission. Patient also reports that he gets very fatigued easily with shortness of breath. I advised the patient that he should go back to the hospital for evaluation. Patient declines, stating that he cant go back there right now because he has too much going on.  Patient then put me on hold and got back on the phone and states that he was talking to his PCP and that he has an appointment to go see them today. Encouraged him to keep that appointment. He denies any current chest pain but advised that if chest pain returns he should go to the ED. Patient verbalized understanding.

## 2021-05-11 ENCOUNTER — Ambulatory Visit (HOSPITAL_COMMUNITY): Payer: Medicaid Other | Attending: Cardiology

## 2021-05-11 ENCOUNTER — Other Ambulatory Visit: Payer: Self-pay | Admitting: *Deleted

## 2021-05-11 ENCOUNTER — Ambulatory Visit (HOSPITAL_COMMUNITY): Payer: Medicaid Other

## 2021-05-11 ENCOUNTER — Other Ambulatory Visit: Payer: Self-pay

## 2021-05-11 ENCOUNTER — Other Ambulatory Visit: Payer: Self-pay | Admitting: Nurse Practitioner

## 2021-05-11 DIAGNOSIS — I422 Other hypertrophic cardiomyopathy: Secondary | ICD-10-CM

## 2021-05-11 DIAGNOSIS — I452 Bifascicular block: Secondary | ICD-10-CM

## 2021-05-11 DIAGNOSIS — Z950 Presence of cardiac pacemaker: Secondary | ICD-10-CM

## 2021-05-11 LAB — ECHOCARDIOGRAM LIMITED
AV Mean grad: 7 mmHg
AV Peak grad: 7.3 mmHg
Ao pk vel: 1.35 m/s
Area-P 1/2: 2.48 cm2
S' Lateral: 2.7 cm

## 2021-05-11 NOTE — Patient Instructions (Signed)
Visit Information  Allen Gould was given information about Medicaid Managed Care team care coordination services as a part of their [place] Medicaid benefit. Allen Gould verbally consentedto engagement with the [facility] Managed Care team.   If you are experiencing a medical emergency, please call 911 or report to your local emergency department or urgent care.   If you have a non-emergency medical problem during routine business hours, please contact your provider's office and ask to speak with a nurse.   For questions related to your [facility] health plan, please call: [phone]  OR visit the member homepage at: [email or web address]  If you would like to schedule transportation through your [facility] plan, please call the following number at least 2 days in advance of your appointment: [phone]  If you are experiencing a behavioral health crisis, call the [place] at [phone]). The line is available 24 hours a day, seven days a week.  If you would like help to quit smoking, call 1-800-QUIT-NOW (914) 694-8183) OR Espaol: 1-855-Djelo-Ya (1-224-825-0037) o para ms informacin haga clic aqu or Text READY to 200-400 to register via text  Allen Gould - following are the goals we discussed in your visit today:   Goals Addressed             This Visit's Progress    COMPLETED: Find Help in My Community       Resolving due to duplicate goal  Timeframe:  Long-Range Goal Priority:  High Start Date:  12/03/20                           Expected End Date:  05/11/21                     Follow Up Date 05/11/21    - call [phone] for status of disability application/follow up with your Attorney for updates - work with Wyatt Portela, SW for assistance with resources for food, transportation, housing, and financial  resources - begin a notebook of services in my neighborhood or community - follow-up on any referrals for help I am given    Why is this important?   Knowing how and where to find help for yourself or family in your neighborhood and community is an important skill.  You will want to take some steps to learn how.         COMPLETED: Track and Manage Symptoms-Heart Failure       Resolving due to duplicate goal  Timeframe:  Long-Range Goal Priority:  High Start Date:    12/03/20                         Expected End Date:  05/11/21                     Follow Up Date 05/11/21    - attend scheduled appointments - begin a heart failure diary - bring diary to all appointments - eat more whole grains, fruits and vegetables, lean meats and healthy fats - know when to call the doctor - track symptoms and what helps feel better or worse  - contact provider with questions or concerns - call provider with weight gain of 3# in 24 hours or 5# in a week - take all medications as directed - work with MM Pharmacist for medication management 03/10/21  Why is this important?   You will be able to handle your symptoms better if you keep track of them.  Making some simple changes to your lifestyle will help.  Eating healthy is one thing you can do to take good care of yourself.            Please see education materials related to CHF and depression provided by MyChart link.  Patient has access to MyChart and can view provided education  Telephone follow up appointment with Managed Medicaid care management team member scheduled for:05/22/21 @ 10:30am  [name] RN, BSN [place] RN Care Coordinator   Following is a copy of your plan of care:  Care Plan : Heart Failure (Adult)  Updates made by Melissa Montane, RN since 05/11/2021 12:00 AM  Completed 05/11/2021   Problem: Symptom Exacerbation (Heart Failure) Resolved 05/11/2021  Note:   Resolving due  to duplicate goal     Long-Range Goal: Symptom Exacerbation Prevented or Minimized Completed 05/11/2021  Start Date: 12/03/2020  Expected End Date: 05/11/2021  Recent Progress: On track  Priority: High  Note:   Current Barriers:  Knowledge deficit related to basic heart failure pathophysiology and self care management Financial strain-working on disability, applied to [employer] Social Support Does not adhere to prescribed medication regimen Lacks social connections Does not contact provider office for questions/concerns Resolving due to duplicate goal  Case Manager Clinical Goal(s):  patient will weigh self daily and record patient will verbalize understanding of Heart Failure Action Plan and when to call doctor patient will take all Heart Failure mediations as prescribed patient will weigh daily and record (notifying MD of 3 lb weight gain over night or 5 lb in a week) Interventions:  Basic overview and discussion of pathophysiology of Heart Failure reviewed  Provided verbal education on low sodium diet Advised patient to weigh each morning after emptying bladder Discussed importance of daily weight and advised patient to weigh and record daily Reviewed role of diuretics in prevention of fluid overload and management of heart failure Reviewed upcoming appointments  Provided therapeutic listening, discussed StepUp program, seeking employment, encouragement provided Reviewed medications and discussed patient taking as directed, having difficulty affording medications, patient scheduled with [facility] Pharmacist today RNCM will continue to follow, patient appreciates the interaction Patient Goals/Self-Care Activities - call [phone] for status of disability application/follow up with your Attorney for updates - work with Wyatt Portela, [place] for assistance with resources for food, transportation, housing, and financial resources - begin a  notebook of services in my neighborhood or community - follow-up on any referrals for help I am given - begin a heart failure diary - bring diary to all appointments - eat more whole grains, fruits and vegetables, lean meats and healthy fats - know when to call the doctor - track symptoms and what helps feel better or worse  - contact provider with questions or concerns - call provider with weight gain of 3# in 24 hours or 5# in a week - take all medications as directed - work with MM Pharmacist for medication management on 03/10/21 Follow Up Plan: Telephone follow up appointment with care management team member scheduled for:05/11/21 @ 10:30am     Care Plan : [place] [facility]  Updates made by Melissa Montane, RN since 05/11/2021 12:00 AM     Problem: Health Management needs related to HF      Long-Range Goal: Development  of Plan of Care to address health management needs related to HF   Start Date: 05/11/2021  Expected End Date: 08/09/2021  Priority: High  Note:   Current Barriers:  Chronic Disease Management support and education needs related to CHF and Depression: depressed mood loss of energy/fatigue disturbed sleep decreased appetite and Agitation Allen Gould is currently living at [facility], he has an exit date of March 2023. He has a court date in January for disability determination. He is unhappy with his living situation and feels like he is disrespected by the staff and a resident. He has feelings of depression and agitation. He does not have feelings of harming himself, but worries that he may harm someone. He is working with a Teacher, music at [employer] and has an appointment on 12/20. He reports taking all of his medications.  RNCM Clinical Goal(s):  Patient will verbalize understanding of plan for management of CHF and Depression as evidenced by patient verbalization of self monitoring activities take all medications exactly as prescribed and will  call provider for medication related questions as evidenced by documentation in EMR    attend all scheduled medical appointments: 12/19 @ 1:50pm for stress echo, 12/20 with Psychiatrist at [facility], 12/28 with PCP and 12/29 with Kerin Salen as evidenced by provider documentation in EMR        work with social worker to address Housing barriers and [place] Concerns  related to the management of CHF, Depression, and Homelessness as evidenced by review of EMR and patient or Education officer, museum report     through collaboration with Consulting civil engineer, provider, and care team.   Interventions: Inter-disciplinary care team collaboration (see longitudinal plan of care) Evaluation of current treatment plan related to  self management and patient's adherence to plan as established by provider  Heart Failure Interventions:  (Status: New goal.)  Long Term Goal  Basic overview and discussion of pathophysiology of Heart Failure reviewed Provided education on low sodium diet Discussed the importance of keeping all appointments with provider Screening for signs and symptoms of depression related to chronic disease state  Assessed social determinant of health barriers Provided therapeutic listening Collaborated with LCSW, Jerene Pitch re: patient mental health  Advised patient to call [phone] for assistance with mental health crisis Provided patient with information on [facility] [phone] Reviewed medications and recent changes made by provider Reviewed scheduled appointments: 12/19 @ 1:50pm for stress echo, 12/28 with PCP at Internal Medicine-Patient aware of all upcoming appointments and has arranged transporation Advised patient to avoid interaction with the resident and/or staff that he feels is being disrespectful  Patient Goals/Self-Care Activities: Take medications as prescribed   Attend all scheduled provider appointments Call pharmacy for medication refills 3-7 days in advance of  running out of medications Call provider office for new concerns or questions  Work with the social worker to address care coordination needs and will continue to work with the clinical team to address health care and disease management related needs call the Suicide and Crisis Lifeline: 988 call 1-800-273-TALK (toll free, 24 hour hotline) go to [facility] [address] [place] [phone]) if experiencing a Mental Health or [place]  call office if I gain more than 2 pounds in one day or 5 pounds in one week do ankle pumps when sitting watch for swelling in feet, ankles and legs every day develop a rescue plan eat more whole grains, fruits and vegetables, lean meats and  healthy fats dress right for the weather, hot or cold

## 2021-05-11 NOTE — Patient Outreach (Signed)
Medicaid Managed Care   Nurse Care Manager Note  05/11/2021 Name:  Allen Gould MRN:  [id number] DOB:  02-22-1966  Allen Gould is an 55 y.o. year old male who is a primary patient of Allen Hansen, MD.  The [facility] Managed Care Coordination team was consulted for assistance with:    CHF Depression  Allen Gould was given information about Medicaid Managed Care Coordination team services today. Allen Gould Patient agreed to services and verbal consent obtained.  Engaged with patient by telephone for follow up visit in response to provider referral for case management and/or care coordination services.   Assessments/Interventions:  Review of past medical history, allergies, medications, health status, including review of consultants reports, laboratory and other test data, was performed as part of comprehensive evaluation and provision of chronic care management services.  SDOH (Social Determinants of Health) assessments and interventions performed: SDOH Interventions    Flowsheet Row Most Recent Value  SDOH Interventions   Food Insecurity Interventions Other (Comment)  [Patient recieving food stamps]  Housing Interventions Other (Comment)  [Patient is on the waitlist for housing]  Depression Interventions/Treatment  --  [Patient has a visit with Psychiatrist tomorrow. has had 6-7 visits]       Care Plan  Allergies  Allergen Reactions   Shellfish Allergy Hives    Medications Reviewed Today     Reviewed by Melissa Montane, RN (Registered Nurse) on 05/11/21 at 1038  Med List Status: <None>   Medication Order Taking? Sig Documenting Provider Last Dose Status Informant  Accu-Chek Softclix Lancets lancets [id number] Yes Check blood sugar 3 times a day [name], MD Taking Active Self, Pharmacy Records  acetaminophen (TYLENOL) 500 MG tablet [id number] No Take 500 mg by mouth every 6 (six) hours as needed (pain).  Patient not taking: Reported on 05/11/2021    [provider] Not Taking Active Self, Pharmacy Records  amLODipine (NORVASC) 10 MG tablet [id number] Yes Take 1 tablet (10 mg total) by mouth daily. [name], MD Taking Active Self, Pharmacy Records  apixaban [facility]) 5 MG TABS tablet [id number] Yes Take 1 tablet (5 mg total) by mouth 2 (two) times daily. [name], MD Taking Active Self, Pharmacy Records  aspirin 81 MG EC tablet [id number] Yes Take 81 mg by mouth daily. Swallow whole. [provider] Taking Active Self, Pharmacy Records  atorvastatin (LIPITOR) 80 MG tablet [id number] Yes Take 1 tablet (80 mg total) by mouth daily. Allen Hansen, MD Taking Active Self, Pharmacy Records  buPROPion [facility] SR) 150 MG 12 hr tablet [id number] Yes Take 1 tablet (150 mg total) by mouth 2 (two) times daily. Allen Persia, MD Taking Active Self, Pharmacy Records           Med Note Allen Gould A   Mon May 11, 2021 10:23 AM) Taking 1/2 tablet, (110m)  clotrimazole-betamethasone (LOTRISONE) cream [id number] Apply 1 application topically 2 (two) times daily. [provider] Taking Active Self, Pharmacy Records  DULoxetine (CYMBALTA) 60 MG capsule [id number] TAKE 1 CAPSULE (60 MG TOTAL) BY MOUTH DAILY. CMitzi Hansen MD Taking Active Self, Pharmacy Records  furosemide (LASIX) 40 MG tablet [id number] Take 40 mg by mouth every other day. [provider] Taking Active Self, Pharmacy Records  gabapentin (NEURONTIN) 300 MG capsule [id number] Take 1 capsule (300 mg total) by mouth in the morning, at noon, and at bedtime. CMitzi Hansen MD Taking Active Self, Pharmacy Records  glucose blood (ACCU-CHEK GUIDE) test strip [id number]  Yes Check blood sugar 3 times a day [name], MD Taking Active Self, Pharmacy Records  metFORMIN (GLUCOPHAGE) 500 MG tablet [id number] Yes Take 1 tablet (500 mg total) by mouth 2 (two) times daily with a meal. [name], [name], MD Taking Active Self, Pharmacy Records   metoprolol succinate (TOPROL XL) 25 MG 24 hr tablet [id number] Yes Take 1 tablet (25 mg total) by mouth at bedtime. [name], MD Taking Active Self, Pharmacy Records  pantoprazole (PROTONIX) 40 MG tablet [id number] Yes Take 1 tablet (40 mg total) by mouth daily. Allen Hansen, MD Taking Active Self, Pharmacy Records  TRULICITY 1.5 NL/8.9QJ [facility] [id number] Yes Inject into the skin once a week. [provider] Taking Active Self, Pharmacy Records           Med Note [name], [name] A   Mon May 11, 2021 10:37 AM) Taking .18m  valsartan (DIOVAN) 80 MG tablet [id number] TAKE 1 TABLET BY MOUTH DAILY.  Patient taking differently: Take 80 mg by mouth daily.   CMitzi Hansen MD Taking Active Self, Pharmacy Records  vitamin B-12 (CYANOCOBALAMIN) 1000 MCG tablet [id number] Take 1,000 mcg by mouth daily. [provider] Taking Active Self, Pharmacy Records  Med List Note (Allen Gould 04/03/21 [id number]: .Allen Gould           Patient Active Problem List   Diagnosis Date Noted   UTI (urinary tract infection) 04/06/2021   Flank pain 04/02/2021   Hyperkalemia 04/02/2021   Hypertrophic cardiomyopathy ([facility] 03/25/2021   Cardiac pacemaker in situ 03/25/2021   Lipoma 01/15/2021   Normocytic anemia 11/14/2020   Acquired dilation of ascending aorta and aortic root (HCC)    Aortic stenosis    Hip pain 2/2 osteoarthritis 06/12/2020   Tachycardia-bradycardia syndrome ([place] 06/05/2020   (HFpEF) heart failure with preserved ejection fraction ([facility] 06/02/2020   Severe depression ([facility] 04/24/2020   Physical deconditioning 04/24/2020   Second degree AV block, Mobitz type I    Bifascicular block    PAC (premature atrial contraction)    PVCs (premature ventricular contractions)    PAF (paroxysmal atrial fibrillation) ([facility]    AKI (acute kidney injury) ([facility] 09/01/2019   Essential hypertension 10/22/2016   Health care maintenance 03/01/2014   Dyslipidemia 03/01/2014   Morbid  obesity ([facility] 08/29/2013   Insomnia 08/29/2013   Type II diabetes mellitus ([facility] [place] 08/10/2013   Asthma    GERD (gastroesophageal reflux disease)     Conditions to be addressed/monitored per PCP order:  CHF and Depression  Care Plan : Heart Failure (Adult)  Updates made by RMelissa Montane RN since 05/11/2021 12:00 AM  Completed 05/11/2021   Problem: Symptom Exacerbation (Heart Failure) Resolved 05/11/2021  Note:   Resolving due to duplicate goal     Long-Range Goal: Symptom Exacerbation Prevented or Minimized Completed 05/11/2021  Start Date: 12/03/2020  Expected End Date: 05/11/2021  Recent Progress: On track  Priority: High  Note:   Current Barriers:  Knowledge deficit related to basic heart failure pathophysiology and self care management Financial strain-working on disability, applied to [employer] Social Support Does not adhere to prescribed medication regimen Lacks social connections Does not contact provider office for questions/concerns Resolving due to duplicate goal  Case Manager Clinical Goal(s):  patient will weigh self daily and record patient will verbalize understanding of Heart Failure Action Plan and when to call doctor patient will take all Heart Failure mediations as prescribed patient will weigh daily and record (notifying  MD of 3 lb weight gain over night or 5 lb in a week) Interventions:  Basic overview and discussion of pathophysiology of Heart Failure reviewed  Provided verbal education on low sodium diet Advised patient to weigh each morning after emptying bladder Discussed importance of daily weight and advised patient to weigh and record daily Reviewed role of diuretics in prevention of fluid overload and management of heart failure Reviewed upcoming appointments  Provided therapeutic listening, discussed StepUp program, seeking employment, encouragement provided Reviewed medications and  discussed patient taking as directed, having difficulty affording medications, patient scheduled with [facility] Pharmacist today RNCM will continue to follow, patient appreciates the interaction Patient Goals/Self-Care Activities - call [phone] for status of disability application/follow up with your Attorney for updates - work with Wyatt Portela, [place] for assistance with resources for food, transportation, housing, and financial resources - begin a notebook of services in my neighborhood or community - follow-up on any referrals for help I am given - begin a heart failure diary - bring diary to all appointments - eat more whole grains, fruits and vegetables, lean meats and healthy fats - know when to call the doctor - track symptoms and what helps feel better or worse  - contact provider with questions or concerns - call provider with weight gain of 3# in 24 hours or 5# in a week - take all medications as directed - work with [facility] Pharmacist for medication management on 03/10/21 Follow Up Plan: Telephone follow up appointment with care management team member scheduled for:05/11/21 @ 10:30am     Care Plan : [place] of Care  Updates made by Melissa Montane, RN since 05/11/2021 12:00 AM     Problem: Health Management needs related to HF      Long-Range Goal: Development of Plan of Care to address health management needs related to HF   Start Date: 05/11/2021  Expected End Date: 08/09/2021  Priority: High  Note:   Current Barriers:  Chronic Disease Management support and education needs related to CHF and Depression: depressed mood loss of energy/fatigue disturbed sleep decreased appetite and Agitation Mr. Perry is currently living at [facility], he has an exit date of March 2023. He has a court date in January for disability determination. He is unhappy with his living situation and feels like he is disrespected by the staff and a resident. He has feelings of depression and  agitation. He does not have feelings of harming himself, but worries that he may harm someone. He is working with a Teacher, music at [employer] and has an appointment on 12/20. He reports taking all of his medications.  RNCM Clinical Goal(s):  Patient will verbalize understanding of plan for management of CHF and Depression as evidenced by patient verbalization of self monitoring activities take all medications exactly as prescribed and will call provider for medication related questions as evidenced by documentation in EMR    attend all scheduled medical appointments: 12/19 @ 1:50pm for stress echo, 12/20 with Psychiatrist at [facility], 12/28 with PCP and 12/29 with Kerin Salen as evidenced by provider documentation in EMR        work with social worker to address Housing barriers and [place] Concerns  related to the management of CHF, Depression, and Homelessness as evidenced by review of EMR and patient or Education officer, museum report     through collaboration with Consulting civil engineer, provider, and care team.   Interventions: Inter-disciplinary care team collaboration (see longitudinal plan of  care) Evaluation of current treatment plan related to  self management and patient's adherence to plan as established by provider  Heart Failure Interventions:  (Status: New goal.)  Long Term Goal  Basic overview and discussion of pathophysiology of Heart Failure reviewed Provided education on low sodium diet Discussed the importance of keeping all appointments with provider Screening for signs and symptoms of depression related to chronic disease state  Assessed social determinant of health barriers Provided therapeutic listening Collaborated with LCSW, Jerene Pitch re: patient mental health  Advised patient to call 75 for assistance with mental health crisis Provided patient with information on [facility] [phone] Reviewed medications and recent changes made by provider Reviewed  scheduled appointments: 12/19 @ 1:50pm for stress echo, 12/28 with PCP at Internal Medicine-Patient aware of all upcoming appointments and has arranged transporation Advised patient to avoid interaction with the resident and/or staff that he feels is being disrespectful  Patient Goals/Self-Care Activities: Take medications as prescribed   Attend all scheduled provider appointments Call pharmacy for medication refills 3-7 days in advance of running out of medications Call provider office for new concerns or questions  Work with the social worker to address care coordination needs and will continue to work with the clinical team to address health care and disease management related needs call the Suicide and Crisis Lifeline: 988 call 1-800-273-TALK (toll free, 24 hour hotline) go to [facility] [address] [place] [phone]) if experiencing a Mental Health or [place]  call office if I gain more than 2 pounds in one day or 5 pounds in one week do ankle pumps when sitting watch for swelling in feet, ankles and legs every day develop a rescue plan eat more whole grains, fruits and vegetables, lean meats and healthy fats dress right for the weather, hot or cold       Follow Up:  Patient agrees to Care Plan and Follow-up.  Plan: The Managed Medicaid care management team will reach out to the patient again over the next 14 days.  Date/time of next scheduled RN care management/care coordination outreach:  05/22/21 @ 10:30am  Allen Joiner RN, BSN Universal RN Care Coordinator

## 2021-05-14 ENCOUNTER — Telehealth: Payer: Self-pay

## 2021-05-14 ENCOUNTER — Telehealth: Payer: Self-pay | Admitting: Behavioral Health

## 2021-05-14 NOTE — Telephone Encounter (Signed)
-----   Message from Werner Lean, MD sent at 05/12/2021  4:25 PM EST ----- Let's Try to CPEX then.  He is symptomatic and its hard to figure out if this is heart , lung, or neither.   ----- Message ----- From: Precious Gilding, RN Sent: 05/12/2021  11:08 AM EST To: Werner Lean, MD  The patient has been notified of the result and verbalized understanding.  All questions (if any) were answered. Windell Musson N Deniese Oberry, RN 05/12/2021 11:08 AM   Pt thinks might be able to do CPEX using a sit down bike.

## 2021-05-14 NOTE — Telephone Encounter (Signed)
Contacted Pt per collaboration w/Dr. [name], MD. This is a ck-in call on Pt mental health wellness. Pt is in the Lobby of [employer] moved into the [place] for privacy. The Shelter wants residents out by 10:30am & they must stay gone for 2 hrs. It is raining heavily this morning.  Pt feels he has no voice in the [place]. He has been accused of things he is not responsible for & his rollator has been taken for another week. He does not know the reason for this.  Pt endorses inc'd agitation, inc'd aggression, & a mean attitude.  Pt has filed a Conservation officer, historic buildings Complaint w/the Case Mngr @ [employer].  Pt reports his need to speak w/someone & Clinician agreed this is the reason for the call today.  Phone was disconnected @ this time. RC to Pt phone & lft msg as he did not answer. He can RC to Clinician & speak another 10 min -  Dr. Theodis Shove

## 2021-05-15 ENCOUNTER — Other Ambulatory Visit: Payer: Self-pay | Admitting: Licensed Clinical Social Worker

## 2021-05-15 DIAGNOSIS — F322 Major depressive disorder, single episode, severe without psychotic features: Secondary | ICD-10-CM

## 2021-05-15 DIAGNOSIS — F5101 Primary insomnia: Secondary | ICD-10-CM

## 2021-05-15 NOTE — Patient Outreach (Signed)
Medicaid Managed Care Social Work Note  05/15/2021 Name:  Allen Gould MRN:  [id number] DOB:  [birth year]  Allen Gould is an 55 y.o. year old male who is a primary patient of [name], MD.  The Medicaid Managed Care Coordination team was consulted for assistance with:  [place] and Resources  Mr. Pattillo was given information about Medicaid Managed Care Coordination team services today. Allen Gould Patient agreed to services and verbal consent obtained.  Engaged with patient  for by telephone forinitial visit in response to referral for case management and/or care coordination services.   Assessments/Interventions:  Review of past medical history, allergies, medications, health status, including review of consultants reports, laboratory and other test data, was performed as part of comprehensive evaluation and provision of chronic care management services.  SDOH: (Social Determinant of Health) assessments and interventions performed: SDOH Interventions    Flowsheet Row Most Recent Value  SDOH Interventions   Stress Interventions Provide Counseling, Offered [employer] Resources       Advanced Directives Status:  See Care Plan for related entries.  Care Plan                 Allergies  Allergen Reactions   Shellfish Allergy Hives    Medications Reviewed Today     Reviewed by Melissa Montane, RN (Registered Nurse) on 05/11/21 at 1038  Med List Status: <None>   Medication Order Taking? Sig Documenting Provider Last Dose Status Informant  Accu-Chek Softclix Lancets lancets [id number] Yes Check blood sugar 3 times a day [name], MD Taking Active Self, Pharmacy Records  acetaminophen (TYLENOL) 500 MG tablet [id number] No Take 500 mg by mouth every 6 (six) hours as needed (pain).  Patient not taking: Reported on 05/11/2021   [provider] Not Taking Active Self, Pharmacy Records  amLODipine (NORVASC) 10 MG tablet [id number] Yes  Take 1 tablet (10 mg total) by mouth daily. [name], MD Taking Active Self, Pharmacy Records  apixaban [facility]) 5 MG TABS tablet [id number] Yes Take 1 tablet (5 mg total) by mouth 2 (two) times daily. [name], MD Taking Active Self, Pharmacy Records  aspirin 81 MG EC tablet [id number] Yes Take 81 mg by mouth daily. Swallow whole. [provider] Taking Active Self, Pharmacy Records  atorvastatin (LIPITOR) 80 MG tablet [id number] Yes Take 1 tablet (80 mg total) by mouth daily. [name], MD Taking Active Self, Pharmacy Records  buPROPion [facility] SR) 150 MG 12 hr tablet [id number] Yes Take 1 tablet (150 mg total) by mouth 2 (two) times daily. Jose Persia, MD Taking Active Self, Pharmacy Records           Med Note [name] A   Mon May 11, 2021 10:23 AM) Taking 1/2 tablet, (75mg )  clotrimazole-betamethasone (LOTRISONE) cream [id number] Yes Apply 1 application topically 2 (two) times daily. [provider] Taking Active Self, Pharmacy Records  DULoxetine (CYMBALTA) 60 MG capsule [id number] Yes TAKE 1 CAPSULE (60 MG TOTAL) BY MOUTH DAILY. [name], MD Taking Active Self, Pharmacy Records  furosemide (LASIX) 40 MG tablet [id number] Yes Take 40 mg by mouth every other day. [provider] Taking Active Self, Pharmacy Records  gabapentin (NEURONTIN) 300 MG capsule [id number] Yes Take 1 capsule (300 mg total) by mouth in the morning, at noon, and at bedtime. [name], MD Taking Active Self, Pharmacy Records  glucose blood (ACCU-CHEK GUIDE) test strip [id number] Yes Check blood sugar 3 times a day Lisabeth Devoid,  Janett Billow, MD Taking Active Self, Pharmacy Records  metFORMIN (GLUCOPHAGE) 500 MG tablet [id number] Yes Take 1 tablet (500 mg total) by mouth 2 (two) times daily with a meal. [name], [name], MD Taking Active Self, Pharmacy Records  metoprolol succinate (TOPROL XL) 25 MG 24 hr tablet [id number] Yes Take 1 tablet (25 mg total) by mouth at bedtime.  [name], MD Taking Active Self, Pharmacy Records  pantoprazole (PROTONIX) 40 MG tablet [id number] Yes Take 1 tablet (40 mg total) by mouth daily. [name], MD Taking Active Self, Pharmacy Records  TRULICITY 1.5 ZT/2.4PY [facility] [id number] Yes Inject into the skin once a week. [provider] Taking Active Self, Pharmacy Records           Med Note [name], MELANIE A   Mon May 11, 2021 10:37 AM) Taking .75mg   valsartan (DIOVAN) 80 MG tablet [id number] Yes TAKE 1 TABLET BY MOUTH DAILY.  Patient taking differently: Take 80 mg by mouth daily.   [name], MD Taking Active Self, Pharmacy Records  vitamin B-12 (CYANOCOBALAMIN) 1000 MCG tablet [id number] Yes Take 1,000 mcg by mouth daily. [provider] Taking Active Self, Pharmacy Records  Med List Note Burnett Harry, CPhT 04/03/21 9735): [name]            Patient Active Problem List   Diagnosis Date Noted   UTI (urinary tract infection) 04/06/2021   Flank pain 04/02/2021   Hyperkalemia 04/02/2021   Hypertrophic cardiomyopathy ([place]) 03/25/2021   Cardiac pacemaker in situ 03/25/2021   Lipoma 01/15/2021   Normocytic anemia 11/14/2020   Acquired dilation of ascending aorta and aortic root (HCC)    Aortic stenosis    Hip pain 2/2 osteoarthritis 06/12/2020   Tachycardia-bradycardia syndrome ([place]) 06/05/2020   (HFpEF) heart failure with preserved ejection fraction ([place]) 06/02/2020   Severe depression ([place]) 04/24/2020   Physical deconditioning 04/24/2020   Second degree AV block, Mobitz type I    Bifascicular block    PAC (premature atrial contraction)    PVCs (premature ventricular contractions)    PAF (paroxysmal atrial fibrillation) ([place])    AKI (acute kidney injury) ([place]) 09/01/2019   Essential hypertension 10/22/2016   Health care maintenance 03/01/2014   Dyslipidemia 03/01/2014   Morbid obesity ([place]) 08/29/2013   Insomnia 08/29/2013   Type II diabetes mellitus ([place]) 08/10/2013   Asthma    GERD  (gastroesophageal reflux disease)     Conditions to be addressed/monitored per PCP order:  Depression  Care Plan : Social Work Plan of Care  Updates made by Greg Cutter, LCSW since 05/15/2021 12:00 AM     Problem: Depression Identification (Depression)      Goal: Depressive Symptoms Identified   Note:   Timeframe:  Long-Range Goal Priority:  High Start Date:   05/15/21                  Expected End Date:  ongoing                     Follow Up Date--05/29/21   - check out counseling - keep 72 percent of counseling appointments - schedule counseling appointment    Why is this important?             Beating depression may take some time.            If you don't feel better right away, don't give up on your treatment plan.    Current barriers:  Chronic Mental Health needs related to depression, stress and transportation barriers.            Mental Health Concerns            Needs Support, Education, and Care Coordination in order to meet unmet mental health needs. Clinical Goal(s): demonstrate a reduction in symptoms related to : Depression, connect with provider for ongoing mental health treatment.  , and increase coping skills, healthy habits, self-management skills, and stress reduction     Clinical Interventions:            Assessed patient's previous and current treatment, coping skills, support system and barriers to care  ?         Depression screen reviewed  ?         Solution-Focused Strategies ?         Mindfulness or Relaxation Training ?         Active listening / Reflection utilized  ?         Emotional Supportive Provided ?         Behavioral Activation ?         Participation in counseling encouraged  ?         Verbalization of feelings encouraged  ?         Crisis Resource Education / information provided  ?         Suicidal Ideation/Homicidal Ideation assessed: ?         Discussed Health Care Power of Attorney  ?         Discussed referral for  counseling           Review various resources, discussed options and provided patient information about  ?         Options for mental health treatment based on need and insurance           Inter-disciplinary care team collaboration (see longitudinal plan of care)           LCSW discussed coping skills for anxiety and depression. SW used empathetic and active and reflective listening, validated feelings/concerns, and provided emotional support. LCSW provided self-care education to help manage her mental health conditions and improve her mood.           Patient has a therapist that comes to the [place] [facility] 2x per month to provide him therapy. Patient is still interested in gaining additional mental health support. [facility] LCSW will place referral [facility] psychiatry [facility]. Patient is agreeable to this referral but ask if they will consider virtual visits as he has transportation issues. [facility] LCSW placed referral to [facility] BSW for transportation assistance. He uses the city bus and has no money for bus passes.            Patient denies any current crises or urgent needs Patient Goals/Self-Care Activities: Over the next 120 days           Call your insurance provider for more information about your Enhanced Benefits      10 LITTLE Things To Do When Youre Feeling Too Down To Do Anything  Take a shower. Even if you plan to stay in all day long and not see a soul, take a shower. It takes the most effort to hop in to the shower but once you do, youll feel immediate results. It will wake you up and youll be feeling much fresher (and cleaner too).  Brush and floss your teeth. Give your teeth  a good brushing with a floss finish. Its a small task but it feels so good and you can check taking care of your health off the list of things to do.  Do something small on your list. Most of [place] have some small thing we would like to get done (load of laundry, sew a button, email a friend). Doing one of these  things will make you feel like youve accomplished something.  Drink water. Drinking water is easy right? Its also really beneficial for your health so keep a glass beside you all day and take sips often. It gives you energy and prevents you from boredom eating.  Do some floor exercises. The last thing you want to do is exercise but it might be just the thing you need the most. Keep it simple and do exercises that involve sitting or laying on the floor. Even the smallest of exercises release chemicals in the brain that make you feel good. Yoga stretches or core exercises are going to make you feel good with minimal effort.  Make your bed. Making your bed takes a few minutes but its productive and youll feel relieved when its done. An unmade bed is a huge visual reminder that youre having an unproductive day. Do it and consider it your housework for the day.  Put on some nice clothes. Take the sweatpants off even if you dont plan to go anywhere. Put on clothes that make you feel good. Take a look in the mirror so your brain recognizes the sweatpants have been replaced with clothes that make you look great. Its an instant confidence booster.  Wash the dishes. A pile of dirty dishes in the sink is a reflection of your mood. Its possible that if you wash up the dishes, your mood will follow suit. Its worth a try.  Cook a real meal. If you have the luxury to have a do nothing day, you have time to make a real meal for yourself. Make a meal that you love to eat. The process is good to get you out of the funk and the food will ensure you have more energy for tomorrow.  Write out your thoughts by hand. When you hand write, you stimulate your brain to focus on the moment that youre in so make yourself comfortable and write whatever comes into your mind. Put those thoughts out on paper so they stop spinning around in your head. Those thoughts might be the very thing holding you down.        Follow up:  Patient agrees to Care Plan and Follow-up.  Plan: The Managed Medicaid care management team will reach out to the patient again over the next 30 days.  Date/time of next scheduled Social Work care management/care coordination outreach:  05/29/21 at 11:00 am  Eula Fried, BSW, MSW, [place] Medicaid LCSW [email or web address] Phone: [phone]

## 2021-05-15 NOTE — Patient Instructions (Signed)
Visit Information  Mr. Reede was given information about Medicaid Managed Care team care coordination services as a part of their [place] Medicaid benefit. [name] verbally consentedto engagement with the [facility] Managed Care team.   If you are experiencing a medical emergency, please call 911 or report to your local emergency department or urgent care.   If you have a non-emergency medical problem during routine business hours, please contact your provider's office and ask to speak with a nurse.   For questions related to your [facility] health plan, please call: [phone]  OR visit the member homepage at: [email or web address]  If you would like to schedule transportation through your [facility] plan, please call the following number at least 2 days in advance of your appointment: [phone]  If you are experiencing a behavioral health crisis, call the [place] at [phone]). The line is available 24 hours a day, seven days a week.  If you would like help to quit smoking, call 1-800-QUIT-NOW 206 549 7418) OR Espaol: 1-855-Djelo-Ya (9-211-941-7408) o para ms informacin haga clic aqu or Text READY to 200-400 to register via text  Following is a copy of your plan of care:  Care Plan : Social Work Plan of Care  Updates made by Greg Cutter, LCSW since 05/15/2021 12:00 AM     Problem: Depression Identification (Depression)      Goal: Depressive Symptoms Identified   Note:   Timeframe:  Long-Range Goal Priority:  High Start Date:   05/15/21                  Expected End Date:  ongoing                     Follow Up Date--05/29/21   - check out counseling - keep 38 percent of counseling appointments - schedule counseling appointment    Why is this important?             Beating depression may take  some time.            If you don't feel better right away, don't give up on your treatment plan.    Current barriers:             Chronic Mental Health needs related to depression, stress and transportation barriers.            Mental Health Concerns            Needs Support, Education, and Care Coordination in order to meet unmet mental health needs. Clinical Goal(s): demonstrate a reduction in symptoms related to : Depression, connect with provider for ongoing mental health treatment.  , and increase coping skills, healthy habits, self-management skills, and stress reduction         10 LITTLE Things To Do When Youre Feeling Too Down To Do Anything  Take a shower. Even if you plan to stay in all day long and not see a soul, take a shower. It takes the most effort to hop in to the shower but once you do, youll feel immediate results. It will wake you up and youll be feeling much fresher (and cleaner too).  Brush and floss your teeth. Give your teeth a good brushing with a floss finish. Its a small task but it feels so good and you can check taking care of your health off the list of things to do.  Do something  small on your list. Most of [place] have some small thing we would like to get done (load of laundry, sew a button, email a friend). Doing one of these things will make you feel like youve accomplished something.  Drink water. Drinking water is easy right? Its also really beneficial for your health so keep a glass beside you all day and take sips often. It gives you energy and prevents you from boredom eating.  Do some floor exercises. The last thing you want to do is exercise but it might be just the thing you need the most. Keep it simple and do exercises that involve sitting or laying on the floor. Even the smallest of exercises release chemicals in the brain that make you feel good. Yoga stretches or core exercises are going to make you feel good with minimal effort.  Make  your bed. Making your bed takes a few minutes but its productive and youll feel relieved when its done. An unmade bed is a huge visual reminder that youre having an unproductive day. Do it and consider it your housework for the day.  Put on some nice clothes. Take the sweatpants off even if you dont plan to go anywhere. Put on clothes that make you feel good. Take a look in the mirror so your brain recognizes the sweatpants have been replaced with clothes that make you look great. Its an instant confidence booster.  Wash the dishes. A pile of dirty dishes in the sink is a reflection of your mood. Its possible that if you wash up the dishes, your mood will follow suit. Its worth a try.  Cook a real meal. If you have the luxury to have a do nothing day, you have time to make a real meal for yourself. Make a meal that you love to eat. The process is good to get you out of the funk and the food will ensure you have more energy for tomorrow.  Write out your thoughts by hand. When you hand write, you stimulate your brain to focus on the moment that youre in so make yourself comfortable and write whatever comes into your mind. Put those thoughts out on paper so they stop spinning around in your head. Those thoughts might be the very thing holding you down.      Eula Fried, BSW, MSW, [employer] Managed Medicaid LCSW [email or web address] Phone: [phone]

## 2021-05-20 ENCOUNTER — Encounter: Payer: Medicaid Other | Admitting: Internal Medicine

## 2021-05-21 ENCOUNTER — Telehealth: Payer: Self-pay

## 2021-05-21 ENCOUNTER — Ambulatory Visit: Payer: Medicaid Other | Admitting: Behavioral Health

## 2021-05-21 DIAGNOSIS — F331 Major depressive disorder, recurrent, moderate: Secondary | ICD-10-CM

## 2021-05-21 DIAGNOSIS — I422 Other hypertrophic cardiomyopathy: Secondary | ICD-10-CM

## 2021-05-21 DIAGNOSIS — I452 Bifascicular block: Secondary | ICD-10-CM

## 2021-05-21 DIAGNOSIS — Z950 Presence of cardiac pacemaker: Secondary | ICD-10-CM

## 2021-05-21 DIAGNOSIS — F419 Anxiety disorder, unspecified: Secondary | ICD-10-CM

## 2021-05-21 NOTE — Telephone Encounter (Signed)
-----   Message from Werner Lean, MD sent at 05/12/2021  4:25 PM EST ----- Let's Try to CPEX then.  He is symptomatic and its hard to figure out if this is heart , lung, or neither.   ----- Message ----- From: Precious Gilding, RN Sent: 05/12/2021  11:08 AM EST To: Werner Lean, MD  The patient has been notified of the result and verbalized understanding.  All questions (if any) were answered. Nyla Creason N Ellery Tash, RN 05/12/2021 11:08 AM   Pt thinks might be able to do CPEX using a sit down bike.

## 2021-05-21 NOTE — BH Specialist Note (Signed)
Integrated Behavioral Health via Telemedicine Visit  05/21/2021 FRED FRANZEN [id number]  Number of Integrated Behavioral Health visits: 8 Session Start time: 9:30am  Session End time: 10:00am Total time: 30  Referring Provider: Dr. Morrison Old, DO Patient/Family location: Pt is @ [facility] in semi-private [facility] Provider location: [facility] Office All persons participating in visit: Pt & Clinician Types of Service: Individual psychotherapy and Health Promotion  I connected with [name] and/or Alisa Graff Morren's  self  via  Animal nutritionist  (Video is Tree surgeon) and verified that I am speaking with the correct person using two identifiers. Discussed confidentiality:  8th visit  I discussed the limitations of telemedicine and the availability of in person appointments.  Discussed there is a possibility of technology failure and discussed alternative modes of communication if that failure occurs.  I discussed that engaging in this telemedicine visit, they consent to the provision of behavioral healthcare and the services will be billed under their insurance.  Patient and/or legal guardian expressed understanding and consented to Telemedicine visit:  8th visit  Presenting Concerns: Patient and/or family reports the following symptoms/concerns: elevated anx/dep & difficulty controlling anger issues Duration of problem: months; Severity of problem: moderate  Patient and/or Family's Strengths/Protective Factors: Social connections, Concrete supports in place (healthy food, safe environments, etc.), and Sense of purpose  Goals Addressed: Patient will:  Reduce symptoms of: anxiety, depression, stress, and anger    Increase knowledge and/or ability of: coping skills, healthy habits, and stress reduction   Demonstrate ability to: Increase healthy adjustment to current life circumstances and Increase adequate support systems for  patient/family  Progress towards Goals: Ongoing  Interventions: Interventions utilized:  Behavioral Activation and Supportive Counseling Standardized Assessments completed:  screeners prn  Patient and/or Family Response: Pt receptive to call today & requests future appt  Assessment: Patient currently experiencing inc in positive attitude as he faces the New Year. Pt c/o anger issues, lack of privacy, & the lack of return of his rollator for ambulation.  Pt is due in [place] on 05/28/2021 w/[facility] [facility], [place] for his Disability Hearing.  Patient may benefit from f/u on his appts so he can report this @ his Disability Hearing.  Plan: Follow up with behavioral health clinician on : 2-3 wks on telehealth for 30 min mental health wellness ck-in Behavioral recommendations: Follow through on the suggestions made today so Disability Hearing will promote forum for you to show diligence w/your [place], [facility] appt, & appt w/Brooke Blanch Media, LCSW. Referral(s): [place] (In Clinic) and Psychological Evaluation/Testing Referral @ [facility] care @ [phone].  I discussed the assessment and treatment plan with the patient and/or parent/guardian. They were provided an opportunity to ask questions and all were answered. They agreed with the plan and demonstrated an understanding of the instructions.   They were advised to call back or seek an in-person evaluation if the symptoms worsen or if the condition fails to improve as anticipated.  Donnetta Hutching, LMFT

## 2021-05-21 NOTE — Telephone Encounter (Signed)
Called pt reviewed MD recommendation that pt have a CPX.  Educated pt on the purpose of CPX pt verbalizes understanding.  Reviewed instructions informed pt that someone will call and review instructions prior to appointment.  Advised to call the office with any questions or concerns.

## 2021-05-22 ENCOUNTER — Other Ambulatory Visit: Payer: Self-pay | Admitting: *Deleted

## 2021-05-22 ENCOUNTER — Other Ambulatory Visit: Payer: Self-pay

## 2021-05-22 NOTE — Patient Instructions (Signed)
Visit Information  Mr. Miyamoto was given information about Medicaid Managed Care team care coordination services as a part of their [place] Medicaid benefit. [name] verbally consentedto engagement with the [facility] Managed Care team.   If you are experiencing a medical emergency, please call 911 or report to your local emergency department or urgent care.   If you have a non-emergency medical problem during routine business hours, please contact your provider's office and ask to speak with a nurse.   For questions related to your [employer] health plan, please call: [phone]  OR visit the member homepage at: [email or web address]  If you would like to schedule transportation through your [facility] plan, please call the following number at least 2 days in advance of your appointment: [phone]  If you are experiencing a behavioral health crisis, call the [place] at [phone]). The line is available 24 hours a day, seven days a week.  If you would like help to quit smoking, call 1-800-QUIT-NOW (281)242-8743) OR Espaol: 1-855-Djelo-Ya (0-712-197-5883) o para ms informacin haga clic aqu or Text READY to 200-400 to register via text  Mr. Sweitzer - following are the goals we discussed in your visit today:   Goals Addressed   None     Please see education materials related to CHF and weight loss provided by MyChart link.  Patient has access to MyChart and can view provided education  Telephone follow up appointment with Managed Medicaid care management team member scheduled for:06/22/21 @ 11:15am  [name] RN, BSN [place] RN Care Coordinator   Following is a copy of your plan of care:  Care Plan : RN Care Manager Plan of Care  Updates made by Melissa Montane, RN since  05/22/2021 12:00 AM     Problem: Health Management needs related to HF      Long-Range Goal: Development of Plan of Care to address health management needs related to HF   Start Date: 05/11/2021  Expected End Date: 08/09/2021  Priority: High  Note:   Current Barriers:  Chronic Disease Management support and education needs related to CHF and pain Mr. Kynard is working with the therapist at the [employer] and [place] for mental health needs. His disability determination hearing is 05/28/21. He has bilat hip pain and reports current pain management is not sufficient.  RNCM Clinical Goal(s):  Patient will verbalize understanding of plan for management of CHF and Depression as evidenced by patient verbalization of self monitoring activities take all medications exactly as prescribed and will call provider for medication related questions as evidenced by documentation in EMR    attend all scheduled medical appointments: 1/25 with Kerin Salen and reschedule appointment on 1/5 with PCP as evidenced by provider documentation in EMR        work with social worker to address Housing barriers and [place] Concerns  related to the management of CHF, Depression, and Homelessness as evidenced by review of EMR and patient or Education officer, museum report     through collaboration with Consulting civil engineer, provider, and care team.   Interventions: Inter-disciplinary care team collaboration (see longitudinal plan of care) Evaluation of current treatment plan related to  self management and patient's adherence to plan as established by provider   Pain Interventions:  (Status:  New goal.) Long Term Goal Pain assessment performed Medications reviewed Reviewed provider established plan for pain management  Counseled on the importance of reporting any/all new or changed pain symptoms or management strategies to pain management provider Reviewed with patient prescribed pharmacological and  nonpharmacological pain relief strategies Advised patient to schedule a follow up with Orthopedics to discuss increased pain Discussed weight loss and the benefits, education provided  Heart Failure Interventions:  (Status: Goal on Track (progressing): YES.)  Long Term Goal  Provided education on low sodium diet Discussed the importance of keeping all appointments with provider Assessed social determinant of health barriers Provided therapeutic listening Provided patient with information on [place] [phone] Reviewed scheduled appointments: 1/25 with Kerin Salen and needing to reschedule appointment on 1/5 with PCP-Patient aware of all upcoming appointments and has arranged transporation   Patient Goals/Self-Care Activities: Take medications as prescribed   Attend all scheduled provider appointments Call pharmacy for medication refills 3-7 days in advance of running out of medications Call provider office for new concerns or questions  Work with the social worker to address care coordination needs and will continue to work with the clinical team to address health care and disease management related needs call the Suicide and Crisis Lifeline: 988 call 1-800-273-TALK (toll free, 24 hour hotline) go to [facility] [address] [place] [phone]) if experiencing a Mental Health or [place]  call office if I gain more than 2 pounds in one day or 5 pounds in one week do ankle pumps when sitting watch for swelling in feet, ankles and legs every day develop a rescue plan eat more whole grains, fruits and vegetables, lean meats and healthy fats dress right for the weather, hot or cold

## 2021-05-22 NOTE — Patient Outreach (Signed)
Medicaid Managed Care   Nurse Care Manager Note  05/22/2021 Name:  Allen Gould MRN:  [id number] DOB:  [birth year]  Allen Gould is an 55 y.o. year old male who is a primary patient of [name], MD.  The [facility] Managed Care Coordination team was consulted for assistance with:    CHF pain  Mr. Eichhorst was given information about Medicaid Managed Care Coordination team services today. Allen Gould Patient agreed to services and verbal consent obtained.  Engaged with patient by telephone for follow up visit in response to provider referral for case management and/or care coordination services.   Assessments/Interventions:  Review of past medical history, allergies, medications, health status, including review of consultants reports, laboratory and other test data, was performed as part of comprehensive evaluation and provision of chronic care management services.  SDOH (Social Determinants of Health) assessments and interventions performed: SDOH Interventions    Flowsheet Row Most Recent Value  SDOH Interventions   Financial Strain Interventions Other (Comment)  [Patient has court date for disability determination 05/28/2021]  Transportation Interventions Other (Comment)  [Medical transportation provided by [employer] [phone]]       Care Plan  Allergies  Allergen Reactions   Shellfish Allergy Hives    Medications Reviewed Today     Reviewed by Melissa Montane, RN (Registered Nurse) on 05/22/21 at 57  Med List Status: <None>   Medication Order Taking? Sig Documenting Provider Last Dose Status Informant  Accu-Chek Softclix Lancets lancets [id number] Yes Check blood sugar 3 times a day [name], MD Taking Active Self, Pharmacy Records  acetaminophen (TYLENOL) 500 MG tablet [id number] No Take 500 mg by mouth every 6 (six) hours as needed (pain).  Patient not taking: Reported on 05/11/2021   [provider] Not Taking Active Self,  Pharmacy Records  amLODipine (NORVASC) 10 MG tablet [id number] Yes Take 1 tablet (10 mg total) by mouth daily. [name], MD Taking Active Self, Pharmacy Records  apixaban [facility]) 5 MG TABS tablet [id number] Yes Take 1 tablet (5 mg total) by mouth 2 (two) times daily. [name], MD Taking Active Self, Pharmacy Records  aspirin 81 MG EC tablet [id number] Yes Take 81 mg by mouth daily. Swallow whole. [provider] Taking Active Self, Pharmacy Records  atorvastatin (LIPITOR) 80 MG tablet [id number] Yes Take 1 tablet (80 mg total) by mouth daily. [name], MD Taking Active Self, Pharmacy Records  buPROPion [facility] SR) 150 MG 12 hr tablet [id number] Yes Take 1 tablet (150 mg total) by mouth 2 (two) times daily. Jose Persia, MD Taking Active Self, Pharmacy Records           Med Note [name] A   Mon May 11, 2021 10:23 AM) Taking 1/2 tablet, (75mg )  clotrimazole-betamethasone (LOTRISONE) cream [id number] Yes Apply 1 application topically 2 (two) times daily. [provider] Taking Active Self, Pharmacy Records  DULoxetine (CYMBALTA) 60 MG capsule [id number] Yes TAKE 1 CAPSULE (60 MG TOTAL) BY MOUTH DAILY. [name], MD Taking Active Self, Pharmacy Records  furosemide (LASIX) 40 MG tablet [id number] Yes Take 40 mg by mouth every other day. [provider] Taking Active Self, Pharmacy Records  gabapentin (NEURONTIN) 300 MG capsule [id number] Yes Take 1 capsule (300 mg total) by mouth in the morning, at noon, and at bedtime. [name], MD Taking Active Self, Pharmacy Records  glucose blood (ACCU-CHEK GUIDE) test strip [id number] Yes Check blood sugar 3 times a day [name], MD Taking Active  Self, Pharmacy Records  metFORMIN (GLUCOPHAGE) 500 MG tablet [id number] Yes Take 1 tablet (500 mg total) by mouth 2 (two) times daily with a meal. [name], [name], MD Taking Active Self, Pharmacy Records  metoprolol succinate (TOPROL XL) 25 MG 24 hr  tablet [id number] Yes Take 1 tablet (25 mg total) by mouth at bedtime. [name], MD Taking Active Self, Pharmacy Records  pantoprazole (PROTONIX) 40 MG tablet [id number] Yes Take 1 tablet (40 mg total) by mouth daily. [name], MD Taking Active Self, Pharmacy Records  TRULICITY 1.5 OA/4.1YS [facility] [id number] Yes Inject into the skin once a week. [provider] Taking Active Self, Pharmacy Records           Med Note [name], Dustie Brittle A   Mon May 11, 2021 10:37 AM) Taking .75mg   valsartan (DIOVAN) 80 MG tablet [id number] Yes TAKE 1 TABLET BY MOUTH DAILY.  Patient taking differently: Take 80 mg by mouth daily.   [name], MD Taking Active Self, Pharmacy Records  vitamin B-12 (CYANOCOBALAMIN) 1000 MCG tablet [id number] Yes Take 1,000 mcg by mouth daily. [provider] Taking Active Self, Pharmacy Records  Med List Note Burnett Harry, CPhT 04/03/21 [id number]): [name]            Patient Active Problem List   Diagnosis Date Noted   UTI (urinary tract infection) 04/06/2021   Flank pain 04/02/2021   Hyperkalemia 04/02/2021   Hypertrophic cardiomyopathy ([place]) 03/25/2021   Cardiac pacemaker in situ 03/25/2021   Lipoma 01/15/2021   Normocytic anemia 11/14/2020   Acquired dilation of ascending aorta and aortic root (HCC)    Aortic stenosis    Hip pain 2/2 osteoarthritis 06/12/2020   Tachycardia-bradycardia syndrome ([place]) 06/05/2020   (HFpEF) heart failure with preserved ejection fraction ([place]) 06/02/2020   Severe depression ([place]) 04/24/2020   Physical deconditioning 04/24/2020   Second degree AV block, Mobitz type I    Bifascicular block    PAC (premature atrial contraction)    PVCs (premature ventricular contractions)    PAF (paroxysmal atrial fibrillation) ([place])    AKI (acute kidney injury) ([place]) 09/01/2019   Essential hypertension 10/22/2016   Health care maintenance 03/01/2014   Dyslipidemia 03/01/2014   Morbid obesity ([place]) 08/29/2013   Insomnia 08/29/2013    Type II diabetes mellitus ([place]) 08/10/2013   Asthma    GERD (gastroesophageal reflux disease)     Conditions to be addressed/monitored per PCP order:  CHF and pain  Care Plan : RN Care Manager Plan of Care  Updates made by Melissa Montane, RN since 05/22/2021 12:00 AM     Problem: Health Management needs related to HF      Long-Range Goal: Development of Plan of Care to address health management needs related to HF   Start Date: 05/11/2021  Expected End Date: 08/09/2021  Priority: High  Note:   Current Barriers:  Chronic Disease Management support and education needs related to CHF and pain Mr. Puff is working with the therapist at the [employer] and [place] for mental health needs. His disability determination hearing is 05/28/21. He has bilat hip pain and reports current pain management is not sufficient.  RNCM Clinical Goal(s):  Patient will verbalize understanding of plan for management of CHF and Depression as evidenced by patient verbalization of self monitoring activities take all medications exactly as prescribed and will call provider for medication related questions as evidenced by documentation in EMR    attend all scheduled medical appointments: 1/25 with Kerin Salen and  reschedule appointment on 1/5 with PCP as evidenced by provider documentation in EMR        work with social worker to address Housing barriers and [place] Concerns  related to the management of CHF, Depression, and Homelessness as evidenced by review of EMR and patient or Education officer, museum report     through collaboration with Consulting civil engineer, provider, and care team.   Interventions: Inter-disciplinary care team collaboration (see longitudinal plan of care) Evaluation of current treatment plan related to  self management and patient's adherence to plan as established by provider   Pain Interventions:  (Status:  New goal.) Long Term Goal Pain assessment performed Medications  reviewed Reviewed provider established plan for pain management Counseled on the importance of reporting any/all new or changed pain symptoms or management strategies to pain management provider Reviewed with patient prescribed pharmacological and nonpharmacological pain relief strategies Advised patient to schedule a follow up with Orthopedics to discuss increased pain Discussed weight loss and the benefits, education provided  Heart Failure Interventions:  (Status: Goal on Track (progressing): YES.)  Long Term Goal  Provided education on low sodium diet Discussed the importance of keeping all appointments with provider Assessed social determinant of health barriers Provided therapeutic listening Provided patient with information on [place] [phone] Reviewed scheduled appointments: 1/25 with Kerin Salen and needing to reschedule appointment on 1/5 with PCP-Patient aware of all upcoming appointments and has arranged transporation   Patient Goals/Self-Care Activities: Take medications as prescribed   Attend all scheduled provider appointments Call pharmacy for medication refills 3-7 days in advance of running out of medications Call provider office for new concerns or questions  Work with the social worker to address care coordination needs and will continue to work with the clinical team to address health care and disease management related needs call the Suicide and Crisis Lifeline: 988 call 1-800-273-TALK (toll free, 24 hour hotline) go to [facility] [address] [place] [phone]) if experiencing a Mental Health or [place]  call office if I gain more than 2 pounds in one day or 5 pounds in one week do ankle pumps when sitting watch for swelling in feet, ankles and legs every day develop a rescue plan eat more whole grains, fruits and vegetables, lean meats and healthy fats dress right for the  weather, hot or cold       Follow Up:  Patient agrees to Care Plan and Follow-up.  Plan: The Managed Medicaid care management team will reach out to the patient again over the next 30 days.  Date/time of next scheduled RN care management/care coordination outreach:  06/22/21 @ 11:15am  [name] RN, BSN [place] RN Care Coordinator

## 2021-05-25 ENCOUNTER — Other Ambulatory Visit: Payer: Self-pay | Admitting: Cardiology

## 2021-05-25 DIAGNOSIS — G4733 Obstructive sleep apnea (adult) (pediatric): Secondary | ICD-10-CM

## 2021-05-28 ENCOUNTER — Encounter: Payer: Medicaid Other | Admitting: Internal Medicine

## 2021-05-29 ENCOUNTER — Other Ambulatory Visit: Payer: Self-pay

## 2021-05-29 NOTE — Patient Outreach (Signed)
Care Coordination  05/29/2021  Allen Gould [birth year] [id number]   Medicaid Managed Care   Unsuccessful Outreach Note  05/29/2021 Name: Allen Gould MRN: [id number] DOB: 1966-03-04  Referred by: [name], MD Reason for referral : High Risk Managed Medicaid (MM social Work Unsuccessful Telephone call )   An unsuccessful telephone outreach was attempted today. The patient was referred to the case management team for assistance with care management and care coordination.   Individual that answered the phone stated he was not around patient at the time and would have patient contact SW.   Follow Up Plan: The care management team will reach out to the patient again over the next 30-45 days.

## 2021-05-29 NOTE — Patient Instructions (Signed)
Visit Information  [name]  - as a part of your Medicaid benefit, you are eligible for care management and care coordination services at no cost or copay. I was unable to reach you by phone today but would be happy to help you with your health related needs. Please feel free to call me @ [phone].     Mickel Fuchs, BSW, [place] Managed Medicaid Team  [phone]

## 2021-05-30 ENCOUNTER — Telehealth: Payer: Self-pay | Admitting: Student
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
## 2021-06-01 ENCOUNTER — Other Ambulatory Visit: Payer: Self-pay | Admitting: Internal Medicine

## 2021-06-01 NOTE — Telephone Encounter (Signed)
Next appt scheduled 03/24/22 with PCP. 

## 2021-06-02 ENCOUNTER — Encounter: Payer: Self-pay | Admitting: *Deleted

## 2021-06-02 NOTE — Progress Notes (Signed)
Pt contacted RN c/o chest pain.  He describes the pain as sharp, but also has aching.   Sometimes the aching is exertional.  The sharp pain can come w/ emotional stress or at rest.  Has some sx w/ exertion, but also has chest wall tenderness.   He says he has an appt for a stress test, but this has not been made.   A treadmill test was tried 12/19, but he could not walk on the treadmill.  Hx non-obs CAD at cath 2018, EF 50-55% 09/2020 echo, amyloid test equivocal. Requested to get SPEP/UPEP and needs cMRI w/ GAD to r/o amyloid.   He has appt on 06/23/2021 for a remote device check at 7:00 am.  Only requirement is that he be near his machine.   It is important not to miss those, he paces > 99% of the time.   Not able to do a full exam, but HR is regular, no LE edema, no SOB w/ conversation.   Gave him Voltaren gel to use on his chest wall. Let us know if this does not help.   Will contact the office to get him a f/u appt.   Rosaria Ferries, PAC

## 2021-06-03 ENCOUNTER — Encounter: Payer: Self-pay | Admitting: Internal Medicine

## 2021-06-03 ENCOUNTER — Ambulatory Visit: Payer: Medicaid Other | Admitting: Internal Medicine

## 2021-06-03 ENCOUNTER — Telehealth: Payer: Self-pay | Admitting: Behavioral Health

## 2021-06-03 ENCOUNTER — Other Ambulatory Visit (HOSPITAL_COMMUNITY): Payer: Self-pay | Admitting: *Deleted

## 2021-06-03 VITALS — BP 136/84 | HR 60 | Temp 98.6°F | Wt 289.2 lb

## 2021-06-03 DIAGNOSIS — I422 Other hypertrophic cardiomyopathy: Secondary | ICD-10-CM

## 2021-06-03 DIAGNOSIS — I452 Bifascicular block: Secondary | ICD-10-CM

## 2021-06-03 DIAGNOSIS — Z950 Presence of cardiac pacemaker: Secondary | ICD-10-CM

## 2021-06-03 DIAGNOSIS — F322 Major depressive disorder, single episode, severe without psychotic features: Secondary | ICD-10-CM

## 2021-06-03 MED ORDER — TRULICITY 0.75 MG/0.5ML ~~LOC~~ SOAJ: 0.7500 mg | SUBCUTANEOUS | 3 refills | Status: DC

## 2021-06-03 NOTE — Patient Instructions (Addendum)
I would like you to decrease the bupropion to 150mg  one time per day. I will speak with Dr. Theodis Shove regarding what we need to do to get you established with psychiatry. Her or I will reach out to you to discuss further.   Please return in 1-2 months for a regular diabetes appointment. In the meantime, please keep me in the loop on how you are doing with the decreased bupropion dose.

## 2021-06-03 NOTE — Telephone Encounter (Signed)
Contacted Pt today re: his situation @ [employer] & his recent Disability Hearing on 05/28/2021. Pt is positive about the D Hearing & hopes to hear results this week. Pt is concerned for magmt of his temper. He will hold in his concerns until he feels like exploding, but he has done well through the Holiday Season to keep himself eased.   Pt has contacted [facility] & did not understand what the man on the phone was telling him so he hung up.   Communicated to Pt [facility] will place a second Referral so he can call & set an Appt.  Pt acknowledged & agreed.  Dr. Theodis Shove

## 2021-06-04 MED ORDER — BUPROPION HCL ER (XL) 150 MG PO TB24: 150.0000 mg | ORAL_TABLET | ORAL | 0 refills | Status: DC
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
## 2021-06-04 NOTE — Assessment & Plan Note (Signed)
Current treatment: cymbalta 60mg  daily, wellbutrin SR 150mg  BID He has continued to struggle with aggitation since our telephone call on 12/15. As noted at that time, these symptoms correlate with the timing of starting bupropion. At his visit today, he is starting to have decreased appetite as well.  His mood seems slightly better than it was during our telephone call however he is still more agitated than he has been in the past, prior to starting it. He does not relay a sense of immediate danger to self, denies HI. He is agreeable to decreasing the wellbutrin dose today.   Will transition him to wellbutrin XL 150mg  daily  Continue duloxetine 60mg   I touched base with [place] who was able to speak with pt. Will place another referral to [facility].

## 2021-06-05 ENCOUNTER — Other Ambulatory Visit: Payer: Self-pay

## 2021-06-05 ENCOUNTER — Ambulatory Visit (HOSPITAL_COMMUNITY): Payer: Medicaid Other | Attending: Internal Medicine

## 2021-06-05 DIAGNOSIS — Z950 Presence of cardiac pacemaker: Secondary | ICD-10-CM | POA: Diagnosis not present

## 2021-06-05 DIAGNOSIS — I422 Other hypertrophic cardiomyopathy: Secondary | ICD-10-CM | POA: Diagnosis present

## 2021-06-05 DIAGNOSIS — I421 Obstructive hypertrophic cardiomyopathy: Secondary | ICD-10-CM | POA: Diagnosis not present

## 2021-06-05 DIAGNOSIS — I452 Bifascicular block: Secondary | ICD-10-CM | POA: Diagnosis not present

## 2021-06-05 NOTE — Progress Notes (Signed)
Internal Medicine Clinic Attending  Case discussed with Dr. [name]  At the time of the visit.  We reviewed the resident's history and exam and pertinent patient test results.  I agree with the assessment, diagnosis, and plan of care documented in the resident's note.  

## 2021-06-09 ENCOUNTER — Ambulatory Visit: Payer: Medicaid Other | Admitting: Orthopaedic Surgery

## 2021-06-09 ENCOUNTER — Ambulatory Visit: Payer: Medicaid Other | Admitting: Behavioral Health

## 2021-06-09 DIAGNOSIS — R454 Irritability and anger: Secondary | ICD-10-CM

## 2021-06-09 DIAGNOSIS — F419 Anxiety disorder, unspecified: Secondary | ICD-10-CM

## 2021-06-09 DIAGNOSIS — F332 Major depressive disorder, recurrent severe without psychotic features: Secondary | ICD-10-CM

## 2021-06-10 ENCOUNTER — Other Ambulatory Visit: Payer: Self-pay | Admitting: Critical Care Medicine

## 2021-06-10 ENCOUNTER — Encounter: Payer: Self-pay | Admitting: Physician Assistant

## 2021-06-10 DIAGNOSIS — B351 Tinea unguium: Secondary | ICD-10-CM

## 2021-06-10 DIAGNOSIS — E119 Type 2 diabetes mellitus without complications: Secondary | ICD-10-CM

## 2021-06-10 DIAGNOSIS — Z794 Long term (current) use of insulin: Secondary | ICD-10-CM

## 2021-06-10 NOTE — BH Specialist Note (Signed)
Spoke w/[facility] Gwendolyn with [facility] this day for extensive 45 min conversation re: Pt's disposition & care. ROI provided.  Pt has spoken w/Gwen 6 times total so far & she feels they are doing good for him.   Last Thur, 06/04/2021, Pt discussed his anger issues.Cslr created a Safety Plan & discussed admission to [facility] [address]. Pt was willing to go for Eval/Med Mgmt, but declined once he realized he would lose his bed @ the [facility] facility.  Cslr is working on past Hx of trauma, Pt losing his Fiance, & his current situation w/homelessness & lack of Family communication. Cslr has also given Pt a Pkt about a 50+ Program that offers free activities for this age grp.   Cslr agreed to speak next week on Tues @ 3:00pm after their visit on Mon. This Clinician will also have [facility] telehealth session w/Pt on Wed. Agreed to navigate his Care Plan more closely as a Team.  Dr. Theodis Shove

## 2021-06-10 NOTE — Progress Notes (Addendum)
Pt seen by Dr Joya Gaskins  He had a CPX test, poor functional capacity, 2nd body habitus, had hip pain, and also because HR only got to 94  He has lost 44 lbs so far.   He needs his toenails trimmed, but was told we could not give him clippers.  He has Medicaid, can be seen by the [facility] >> referral sent.   CBGs are good, 89, 99, 120, 79.   124/72, 88, 97% O2  Needs THR, sees Dr Erlinda Hong, gets steroid injections. When he loses 14 more lbs, can have surgery. 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   Lungs CTA bilaterally  His shoes are too big, he has size 14s, needs 13s. The shelter has a way to reimburse for new shoes. The shoes are actually 13s but are loose.   Rosaria Ferries, PA-C 06/10/2021 2:33 PM

## 2021-06-12 ENCOUNTER — Telehealth: Payer: Self-pay

## 2021-06-12 NOTE — Telephone Encounter (Signed)
-----   Message from Vickie Epley, MD sent at 06/10/2021 10:24 PM EST ----- Agree.   Device clinic, please bring Mr Saintjean in to the device clinic to increase the rate responsiveness of his pacemaker. Call me with any questions.  Thanks!  Lars Mage     ----- Message ----- From: Werner Lean, MD Sent: 06/07/2021   2:10 PM EST To: [name], MD, Vickie Epley, MD  Hi all,  Wanted to get your thoughts on his CPET (he was unable to get a stress echo, but I haven't found evidence of a LVOT Obstruction).  Weight loss will be the best part of his care, but potentially may benefit from increase on his base rate.  Thanks, MAC   ----- Message ----- From: Suzan Slick T Sent: 06/05/2021  12:00 PM EST To: Werner Lean, MD

## 2021-06-12 NOTE — Telephone Encounter (Signed)
LVM for patient to call device clinic back to make appointment t have rate responsiveness increased, schedule with device clinic on day when Dr. Quentin Ore is on office preferably

## 2021-06-13 ENCOUNTER — Other Ambulatory Visit: Payer: Self-pay

## 2021-06-13 ENCOUNTER — Encounter (HOSPITAL_COMMUNITY): Payer: Self-pay | Admitting: Psychiatry

## 2021-06-13 ENCOUNTER — Telehealth (HOSPITAL_BASED_OUTPATIENT_CLINIC_OR_DEPARTMENT_OTHER): Payer: Medicaid Other | Admitting: Psychiatry

## 2021-06-13 DIAGNOSIS — F4321 Adjustment disorder with depressed mood: Secondary | ICD-10-CM | POA: Diagnosis not present

## 2021-06-13 DIAGNOSIS — F5102 Adjustment insomnia: Secondary | ICD-10-CM

## 2021-06-13 DIAGNOSIS — F411 Generalized anxiety disorder: Secondary | ICD-10-CM | POA: Diagnosis not present

## 2021-06-13 DIAGNOSIS — F322 Major depressive disorder, single episode, severe without psychotic features: Secondary | ICD-10-CM | POA: Diagnosis not present

## 2021-06-13 MED ORDER — MIRTAZAPINE 7.5 MG PO TABS: 7.5000 mg | ORAL_TABLET | Freq: Every day | ORAL | 0 refills | Status: DC

## 2021-06-13 MED ORDER — BUSPIRONE HCL 7.5 MG PO TABS: 7.5000 mg | ORAL_TABLET | Freq: Every day | ORAL | 1 refills | Status: DC

## 2021-06-13 NOTE — Progress Notes (Signed)
Psychiatric Initial Adult Assessment   Patient Identification: Allen Gould MRN:  [id number] Date of Evaluation:  06/13/2021 Referral Source:primary care and therapist Chief Complaint:  establish care, depression Visit Diagnosis:    ICD-10-CM   1. Severe depression ([place])  F32.2     2. Adjustment insomnia  F51.02     3. Grief  F43.21     4. GAD (generalized anxiety disorder)  F41.1      Virtual Visit via Video Note  I connected with [name] on 06/13/21 at  9:30 AM EST by a video enabled telemedicine application and verified that I am speaking with the correct person using two identifiers.  Location: Patient: homeless shelter Provider: home office   I discussed the limitations of evaluation and management by telemedicine and the availability of in person appointments. The patient expressed understanding and agreed to proceed.      I discussed the assessment and treatment plan with the patient. The patient was provided an opportunity to ask questions and all were answered. The patient agreed with the plan and demonstrated an understanding of the instructions.   The patient was advised to call back or seek an in-person evaluation if the symptoms worsen or if the condition fails to improve as anticipated.  I provided 55 minutes of non-face-to-face time during this encounter.   History of Present Illness: Patient is a 56 year old currently single African-American male currently at homeless shelter  Referred by primary care physician he does have a therapist who comes by at the shelter.  Patient history is complicated grief of losing his fiance 2 years ago since that exacerbated his depression he has had history of depression with hopelessness despair decreased interest and withdrawn sadness diagnosed with depressive disorder.  States he has seen a psychiatrist 20 years ago and was diagnosed with depression was given Zoloft but said it did not help.  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  Was cooking 4 hours in the past.  Is financially stressed he is also homeless shelter.  He endorses anxiety excessive worries unreasonable worries at times and feels that he has been feeling down depressed and recently more agitated does not endorse paranoia or daytime hallucinations at time at night he feels he has visions  Does not endorse any clear manic symptoms  Admitted in 2003 for drug rehab and but noticed since for the last 10 years he has not used any cocaine marijuana and alcohol according to him  No past psychiatric admission.  Has had a difficult childhood and has been passed around as a kid grew up all along with his parents  Aggravating factors; he is on pacemaker, medical comorbidity, loss fianc 2 years ago.  Financially stressed  Modifying factors; limited but he is in a homeless shelter that are helping and he is  on some routine  Duration throughout his adult life     Past Psychiatric History: depression  Previous Psychotropic Medications: Yes  Zoloft , didn't help Substance Abuse History in the last 12 months:  No.  Consequences of Substance Abuse: NA  Past Medical History:  Past Medical History:  Diagnosis Date   Acquired dilation of ascending aorta and aortic root ([place])    6mm by echo 09/2020 but normal on  Chest CTA 05/2020   Acute kidney injury ([place]) 09/01/2019   Asthma    Balanitis 08/09/2014   Bifascicular block    Cough 08/09/2014   Diabetes mellitus without complication ([place])    Dilated aortic root (HCC)    1mm on echo 09/2020   DM (diabetes mellitus) type 2, uncontrolled, without ketoacidosis 08/10/2013   Dyslipidemia 03/01/2014   Financial difficulties 08/09/2014   GERD (gastroesophageal reflux disease)    Health care maintenance 03/01/2014   HOCM (hypertrophic obstructive cardiomyopathy) ([place])    noted on cMRI but no SAM or LVOT gradient   Hypertension    Hypomagnesemia 05/29/2017   Insomnia 08/29/2013   Mitral stenosis    mean MVG 86mmHg   Morbid obesity ([place]) 08/29/2013   Near syncope 05/29/2017   Odontogenic infection of jaw 05/29/2017   PAC (premature atrial contraction)    PAF (paroxysmal atrial fibrillation) (HCC)    Pressure injury of skin 09/02/2019   PVCs (premature ventricular contractions)    Right shoulder pain 08/29/2013   Second degree AV block, Mobitz type I    Sepsis ([place]) 05/29/2017   Sleep apnea    uses cpap    Past Surgical History:  Procedure Laterality Date   APPENDECTOMY     MASS EXCISION N/A 08/19/2020   Procedure: EXCISION OF SCROTAL CYSTS;  Surgeon: Robley Fries, MD;  Location: [facility];  Service: Urology;  Laterality: N/A;  1 HR   PACEMAKER IMPLANT N/A 06/24/2020   Procedure: PACEMAKER IMPLANT;  Surgeon: Vickie Epley, MD;  Location: [facility];  Service: Cardiovascular;  Laterality: N/A;    Family Psychiatric History: denies  Family History:  Family History  Problem Relation Age of Onset   Kidney disease Mother    Diabetes Sister    Hyperlipidemia Sister    Heart disease Other     Social History:   Social History   Socioeconomic History   Marital status: Single    Spouse name: Not on file   Number of children: Not on file   Years of education: Not on file   Highest education level: Not on file  Occupational  History   Not on file  Tobacco Use   Smoking status: Former    Types: Cigarettes    Quit date: 04/24/2005    Years since quitting: 16.1   Smokeless tobacco: Never  Vaping Use   Vaping Use: Never used  Substance and Sexual Activity   Alcohol use: Not Currently    Alcohol/week: 2.0 standard drinks    Types: 2 Cans of beer per week    Comment: Last drink 8 months ago   Drug use: No   Sexual activity: Not on file  Other Topics Concern   Not on file  Social History Narrative   Not on file   Social Determinants of Health   Financial Resource Strain: High Risk   Difficulty of Paying Living Expenses: Very hard  Food Insecurity: Food Insecurity Present   Worried About Running Out of Food in the Last Year: Sometimes true  Ran Out of Food in the Last Year: Sometimes true  Transportation Needs: Unmet Transportation Needs   Lack of Transportation (Medical): Yes   Lack of Transportation (Non-Medical): Yes  Physical Activity: Not on file  Stress: Stress Concern Present   Feeling of Stress : Rather much  Social Connections: Not on file    Additional Social History: grew up with different people or family member, says not with parents and was passed around, didn't finish school.  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 Currently in homeless shelter  Allergies:   Allergies  Allergen Reactions   Shellfish Allergy Hives    Metabolic Disorder Labs: Lab Results  Component Value Date   HGBA1C 6.0 (A) 04/01/2021   MPG 174.29 08/12/2020   MPG 117 09/02/2019   No results found for: PROLACTIN Lab Results  Component Value Date   CHOL 92 (L) 04/08/2020   TRIG 87 04/08/2020   HDL 40 04/08/2020   CHOLHDL 2.3 04/08/2020   VLDL NOT CALC 02/28/2014   LDLCALC 35 04/08/2020   LDLCALC NOT CALC 02/28/2014   Lab Results  Component Value Date   TSH 2.150 10/10/2019    Therapeutic Level Labs: No results found for: LITHIUM No results found for: CBMZ No results  found for: VALPROATE  Current Medications: Current Outpatient Medications  Medication Sig Dispense Refill   busPIRone (BUSPAR) 7.5 MG tablet Take 1 tablet (7.5 mg total) by mouth daily. 30 tablet 1   mirtazapine (REMERON) 7.5 MG tablet Take 1 tablet (7.5 mg total) by mouth at bedtime. 30 tablet 0   Accu-Chek Softclix Lancets lancets Check blood sugar 3 times a day 100 each 12   acetaminophen (TYLENOL) 500 MG tablet Take 500 mg by mouth every 6 (six) hours as needed (pain). (Patient not taking: Reported on 05/11/2021)     amLODipine (NORVASC) 10 MG tablet Take 1 tablet (10 mg total) by mouth daily. 90 tablet 3   apixaban (ELIQUIS) 5 MG TABS tablet Take 1 tablet (5 mg total) by mouth 2 (two) times daily. 180 tablet 3   aspirin 81 MG EC tablet Take 81 mg by mouth daily. Swallow whole.     atorvastatin (LIPITOR) 80 MG tablet Take 1 tablet (80 mg total) by mouth daily. 90 tablet 3   clotrimazole-betamethasone (LOTRISONE) cream Apply 1 application topically 2 (two) times daily.     Dulaglutide (TRULICITY) 1.24 PY/0.9XI SOPN Inject 0.75 mg into the skin once a week. 2 mL 3   DULoxetine (CYMBALTA) 60 MG capsule TAKE 1 CAPSULE (60 MG TOTAL) BY MOUTH DAILY. 90 capsule 2   furosemide (LASIX) 40 MG tablet Take 40 mg by mouth every other day.     gabapentin (NEURONTIN) 300 MG capsule Take 1 capsule (300 mg total) by mouth in the morning, at noon, and at bedtime. 270 capsule 1   glucose blood (ACCU-CHEK GUIDE) test strip Check blood sugar 3 times a day 100 each 12   metFORMIN (GLUCOPHAGE) 500 MG tablet Take 1 tablet (500 mg total) by mouth 2 (two) times daily with a meal. 180 tablet 1   metoprolol succinate (TOPROL XL) 25 MG 24 hr tablet Take 1 tablet (25 mg total) by mouth at bedtime. 90 tablet 3   pantoprazole (PROTONIX) 40 MG tablet Take 1 tablet (40 mg total) by mouth daily. 90 tablet 3   valsartan (DIOVAN) 80 MG tablet TAKE 1 TABLET BY MOUTH DAILY. (Patient taking differently: Take 80 mg by mouth  daily.)  90 tablet 1   vitamin B-12 (CYANOCOBALAMIN) 1000 MCG tablet Take 1,000 mcg by mouth daily.     No current facility-administered medications for this visit.    Psychiatric Specialty Exam: Review of Systems  Cardiovascular:  Negative for chest pain.  Neurological:  Negative for tremors.  Psychiatric/Behavioral:  Positive for agitation, dysphoric mood and sleep disturbance. Negative for hallucinations and suicidal ideas.    There were no vitals taken for this visit.There is no height or weight on file to calculate BMI.  General Appearance: Casual  Eye Contact:  Fair  Speech:  Normal Rate  Volume:  Decreased  Mood:  Dysphoric  Affect:  Constricted  Thought Process:  Goal Directed  Orientation:  Full (Time, Place, and Person)  Thought Content:  Rumination  Suicidal Thoughts:  No  Homicidal Thoughts:  No  Memory:  Recent;   Fair  Judgement:  Other:  shallow  Insight:  Fair  Psychomotor Activity:  Decreased  Concentration:  Concentration: Fair  Recall:  [place] of Knowledge:Fair  Language: Fair      AIMS (if indicated):  not done  Assets:  Desire for Improvement  ADL's:  Intact  Cognition: WNL  Sleep:  Poor   Screenings: PHQ2-9    Flowsheet Row Video Visit from 06/13/2021 in [facility] Office Visit from 06/03/2021 in [facility] Telephone from 05/11/2021 in [facility] Visit from 04/15/2021 in [facility] Office Visit from 04/01/2021 in [facility]  PHQ-2 Total Score 2 6 6 6 4   PHQ-9 Total Score 14 27 21 27 18       Flowsheet Row Video Visit from 06/13/2021 in [facility] Patient Outreach Telephone from 05/11/2021 in [facility] ED from 05/07/2021 in [facility] No Risk Error: Question 6 not populated No Risk       Assessment and Plan: as follows  Major depressive disorder recurrent severe; considering his agitation and relevance to Wellbutrin that was started 2 months ago quite possible with Wellbutrin to be contributing to it we will discontinue and start him on Remeron since he has mentioned he has lost weight for the last couple of months and has poor sleep He will follow-up with his primary care physician regarding to his medical comorbidity including he is on pacemaker and follow-up with his pain condition that may also be contributing to his depression and frustration patient not hopeless or suicidal or homicidal understands to work on breathing techniques and withdraw from any situation that may arise to make him mad continue therapy and regarding to that  Generalized anxiety disorder; start BuSpar for anxiety  Grief; provided supportive therapy continue work on therapy  Insomnia; reviewed sleep hygiene start Remeron 7.5 mg at night  Follow-up in 3 to 4 weeks preferably in office as scheduled for possible therapy 21st at 1:30 call back earlier if concerns he does have therapist and community support that comes by and talk to him.    Merian Capron, MD 1/21/202310:07 AM

## 2021-06-16 ENCOUNTER — Other Ambulatory Visit: Payer: Self-pay

## 2021-06-16 ENCOUNTER — Encounter: Payer: Self-pay | Admitting: Orthopaedic Surgery

## 2021-06-16 ENCOUNTER — Ambulatory Visit: Payer: Self-pay

## 2021-06-16 ENCOUNTER — Ambulatory Visit: Payer: Medicaid Other | Admitting: Physical Medicine and Rehabilitation

## 2021-06-16 ENCOUNTER — Ambulatory Visit (INDEPENDENT_AMBULATORY_CARE_PROVIDER_SITE_OTHER): Payer: Medicaid Other | Admitting: Podiatry

## 2021-06-16 ENCOUNTER — Ambulatory Visit (INDEPENDENT_AMBULATORY_CARE_PROVIDER_SITE_OTHER): Payer: Medicaid Other | Admitting: Orthopaedic Surgery

## 2021-06-16 VITALS — Ht 70.0 in | Wt 294.4 lb

## 2021-06-16 DIAGNOSIS — M160 Bilateral primary osteoarthritis of hip: Secondary | ICD-10-CM

## 2021-06-16 DIAGNOSIS — M25552 Pain in left hip: Secondary | ICD-10-CM | POA: Diagnosis not present

## 2021-06-16 DIAGNOSIS — Z6841 Body Mass Index (BMI) 40.0 and over, adult: Secondary | ICD-10-CM

## 2021-06-16 DIAGNOSIS — M25551 Pain in right hip: Secondary | ICD-10-CM | POA: Diagnosis not present

## 2021-06-16 DIAGNOSIS — Z91199 Patient's noncompliance with other medical treatment and regimen due to unspecified reason: Secondary | ICD-10-CM

## 2021-06-16 MED ORDER — BUPIVACAINE HCL 0.25 % IJ SOLN
5.0000 mL | INTRAMUSCULAR | Status: AC | PRN
  Administered 2021-06-16: 16:00:00 5 mL via INTRA_ARTICULAR

## 2021-06-16 MED ORDER — TRIAMCINOLONE ACETONIDE 40 MG/ML IJ SUSP
40.0000 mg | INTRAMUSCULAR | Status: AC | PRN
  Administered 2021-06-16: 16:00:00 40 mg via INTRA_ARTICULAR

## 2021-06-16 NOTE — Progress Notes (Signed)
° °  GUSTAF MCCARTER - 56 y.o. male MRN [id number]  Date of birth: 05-Dec-1965  Office Visit Note: Visit Date: 06/16/2021 PCP: [name], MD Referred by: [name], MD  Subjective: Chief Complaint  Patient presents with   Right Hip - Pain, Follow-up   Left Hip - Pain, Follow-up   HPI:  ZYRELL CARMEAN is a 56 y.o. male who comes in today at the request of Dr. Eduard Roux for planned Bilateral anesthetic hip arthrogram with fluoroscopic guidance.  The patient has failed conservative care including home exercise, medications, time and activity modification.  This injection will be diagnostic and hopefully therapeutic.  Please see requesting physician notes for further details and justification.   ROS Otherwise per HPI.  Assessment & Plan: Visit Diagnoses:    ICD-10-CM   1. Primary osteoarthritis of both hips  M16.0     2. Body mass index 45.0-49.9, adult (HCC)  Z68.42     3. Morbid obesity ([place])  E66.01       Plan: No additional findings.   Meds & Orders: No orders of the defined types were placed in this encounter.   Orders Placed This Encounter  Procedures   Large Joint Inj    Follow-up: No follow-ups on file.   Procedures: Large Joint Inj: bilateral hip joint on 06/16/2021 3:30 PM Indications: diagnostic evaluation and pain Details: 22 G 3.5 in needle, fluoroscopy-guided anterior approach  Arthrogram: No  Medications (Right): 40 mg triamcinolone acetonide 40 MG/ML; 5 mL bupivacaine 0.25 % Medications (Left): 40 mg triamcinolone acetonide 40 MG/ML; 5 mL bupivacaine 0.25 % Outcome: tolerated well, no immediate complications  There was excellent flow of contrast producing a partial arthrogram of the hip. The patient did have relief of symptoms during the anesthetic phase of the injection. Procedure, treatment alternatives, risks and benefits explained, specific risks discussed. Consent was given by the patient. Immediately prior to procedure a time out was  called to verify the correct patient, procedure, equipment, support staff and site/side marked as required. Patient was prepped and draped in the usual sterile fashion.         Clinical History: No specialty comments available.     Objective:  VS:  HT:5\' 10"  (177.8 cm)    WT:294 lb 6.4 oz (133.5 kg)   BMI:42.24     BP:    HR: bpm   TEMP: ( )   RESP:  Physical Exam   Imaging: No results found.

## 2021-06-16 NOTE — Addendum Note (Signed)
Addended by: Raymondo Band on: 06/16/2021 03:31 PM   Modules accepted: Orders

## 2021-06-16 NOTE — Progress Notes (Signed)
Office Visit Note   Patient: Allen Gould           Date of Birth: 1965/06/26           MRN: [id number] Visit Date: 06/16/2021              Requested by: [name], MD [address] [place],  [place] [address] PCP: [name], MD   Assessment & Plan: Visit Diagnoses:  1. Primary osteoarthritis of both hips   2. Body mass index 45.0-49.9, adult ([place])   3. Morbid obesity ([place])     Plan: Patient returns today for bilateral hip OA.  Requesting repeat injections.  Examination hips unchanged.  We sent the patient upstairs to be seen by Dr. Ernestina Patches for cortisone injections today.  We will see him back as needed.  Follow-Up Instructions: No follow-ups on file.   Orders:  No orders of the defined types were placed in this encounter.  No orders of the defined types were placed in this encounter.     Procedures: No procedures performed   Clinical Data: No additional findings.   Subjective: Chief Complaint  Patient presents with   Right Hip - Pain, Follow-up   Left Hip - Pain, Follow-up    HPI  Review of Systems   Objective: Vital Signs: Ht 5' 10"  (1.778 m)    Wt 294 lb 6.4 oz (133.5 kg)    BMI 42.24 kg/m   Physical Exam  Ortho Exam  Specialty Comments:  No specialty comments available.  Imaging: No results found.   PMFS History: Patient Active Problem List   Diagnosis Date Noted   Flank pain 04/02/2021   Hypertrophic cardiomyopathy ([place]) 03/25/2021   Cardiac pacemaker in situ 03/25/2021   Lipoma 01/15/2021   Normocytic anemia 11/14/2020   Acquired dilation of ascending aorta and aortic root (HCC)    Aortic stenosis    Hip pain 2/2 osteoarthritis 06/12/2020   Tachycardia-bradycardia syndrome ([place]) 06/05/2020   (HFpEF) heart failure with preserved ejection fraction ([place]) 06/02/2020   Severe depression ([place]) 04/24/2020   Physical deconditioning 04/24/2020   Second degree AV block, Mobitz type I    Bifascicular block     PAC (premature atrial contraction)    PVCs (premature ventricular contractions)    PAF (paroxysmal atrial fibrillation) ([place])    Essential hypertension 10/22/2016   Health care maintenance 03/01/2014   Morbid obesity ([place]) 08/29/2013   Insomnia 08/29/2013   Type II diabetes mellitus ([place]) 08/10/2013   Asthma    GERD (gastroesophageal reflux disease)    Past Medical History:  Diagnosis Date   Acquired dilation of ascending aorta and aortic root ([place])    367m by echo 09/2020 but normal on Chest CTA 05/2020   Acute kidney injury ([place] 09/01/2019   Asthma    Balanitis 08/09/2014   Bifascicular block    Cough 08/09/2014   Diabetes mellitus without complication ([place]    Dilated aortic root ([place]    480mon echo 09/2020   DM (diabetes mellitus) type 2, uncontrolled, without ketoacidosis 08/10/2013   Dyslipidemia 03/01/2014   Financial difficulties 08/09/2014   GERD (gastroesophageal reflux disease)    Health care maintenance 03/01/2014   [place]M (hypertrophic obstructive cardiomyopathy) ([place]   noted on cMRI but no SAM or LVOT gradient   Hypertension    Hypomagnesemia 05/29/2017   Insomnia 08/29/2013   Mitral stenosis    mean MVG 67m667m   Morbid obesity ([place]/12/2013   Near  syncope 05/29/2017   Odontogenic infection of jaw 05/29/2017   PAC (premature atrial contraction)    PAF (paroxysmal atrial fibrillation) (HCC)    Pressure injury of skin 09/02/2019   PVCs (premature ventricular contractions)    Right shoulder pain 08/29/2013   Second degree AV block, Mobitz type I    Sepsis ([place]) 05/29/2017   Sleep apnea    uses cpap    Family History  Problem Relation Age of Onset   Kidney disease Mother    Diabetes Sister    Hyperlipidemia Sister    Heart disease Other     Past Surgical History:  Procedure Laterality Date   APPENDECTOMY     MASS EXCISION N/A 08/19/2020   Procedure: EXCISION OF SCROTAL CYSTS;  Surgeon: Robley Fries, MD;  Location: [facility];  Service: Urology;   Laterality: N/A;  1 HR   PACEMAKER IMPLANT N/A 06/24/2020   Procedure: PACEMAKER IMPLANT;  Surgeon: Vickie Epley, MD;  Location: [facility];  Service: Cardiovascular;  Laterality: N/A;   Social History   Occupational History   Not on file  Tobacco Use   Smoking status: Former    Types: Cigarettes    Quit date: 04/24/2005    Years since quitting: 16.1   Smokeless tobacco: Never  Vaping Use   Vaping Use: Never used  Substance and Sexual Activity   Alcohol use: Not Currently    Alcohol/week: 2.0 standard drinks    Types: 2 Cans of beer per week    Comment: Last drink 8 months ago   Drug use: No   Sexual activity: Not on file

## 2021-06-16 NOTE — Progress Notes (Signed)
No show

## 2021-06-17 ENCOUNTER — Ambulatory Visit: Payer: Medicaid Other | Admitting: Behavioral Health

## 2021-06-17 DIAGNOSIS — X503XXA Overexertion from repetitive movements, initial encounter: Secondary | ICD-10-CM

## 2021-06-17 DIAGNOSIS — F4321 Adjustment disorder with depressed mood: Secondary | ICD-10-CM

## 2021-06-17 DIAGNOSIS — F331 Major depressive disorder, recurrent, moderate: Secondary | ICD-10-CM

## 2021-06-17 DIAGNOSIS — F419 Anxiety disorder, unspecified: Secondary | ICD-10-CM

## 2021-06-17 NOTE — BH Specialist Note (Signed)
Integrated Behavioral Health via Telemedicine Visit  06/17/2021 Allen Gould [id number]  Number of Integrated Behavioral Health visits: 9 Session Start time: 9:30am  Session End time: 10:00am Total time: 30  Referring Provider: Dr. Verlin Dike, MD Patient/Family location: Pt is @ [facility] in [facility] Provider location: [facility] Office All persons participating in visit: Pt & Clinician Types of Service: Individual psychotherapy  I connected with Allen Gould and/or Allen Gould's  self  via  Animal nutritionist  (Video is Tree surgeon) and verified that I am speaking with the correct person using two identifiers. Discussed confidentiality:  9th visit  I discussed the limitations of telemedicine and the availability of in person appointments.  Discussed there is a possibility of technology failure and discussed alternative modes of communication if that failure occurs.  I discussed that engaging in this telemedicine visit, they consent to the provision of behavioral healthcare and the services will be billed under their insurance.  Patient and/or legal guardian expressed understanding and consented to Telemedicine visit:  9th visit  Presenting Concerns: Patient and/or family reports the following symptoms/concerns: dec in anx/dep, & anger today Duration of problem: months; Severity of problem: moderate  Patient and/or Family's Strengths/Protective Factors: Concrete supports in place (healthy food, safe environments, etc.), Sense of purpose, and Physical Health (exercise, healthy diet, medication compliance, etc.)  Goals Addressed: Patient will:  Reduce symptoms of: anxiety, depression, and anger rxn    Increase knowledge and/or ability of: healthy habits   Demonstrate ability to: Increase healthy adjustment to current life circumstances, Increase adequate support systems for patient/family, Begin healthy grieving over loss, and  assist/provide psychoedu for Pt to monitor s/e profiles of new medications so compliance is enhanced  Progress towards Goals: Ongoing; Pt is seeking a job where he can create a routine for his days. Pt is trying to keep his focus on his goals. He eventually wants to purchase a home & put it in his Son's name. Pt has been on Remeron & Buspar for almost 2 full days; he slept well last night & his anger is reduced significantly. Pt feels meds are already helping him. He also rec'd cortisone shots for his pain. Pt had his medications delivered yesterday by [facility] to [facility]. Pt is still waiting on the results of his Disability Hearing held on 05/28/2021.  Interventions: Interventions utilized:  Motivational Interviewing and Supportive Counseling Standardized Assessments completed:  screeners prn  Patient and/or Family Response: Pt receptive to call today & in good spirits. Pt requests f/u call in 2 wks.  Assessment: Patient currently experiencing improvement in his overall attitude due to making progress in areas of importance. He is getting himself back on track & appreciates all the assistance he has received.   Patient may benefit from weekly therapy sessions to address his trauma & the loss of his GF.  Plan: Follow up with behavioral health clinician on : 2-3 wks for 30 min on telehealth Behavioral recommendations: None today Referral(s): [place] (In Clinic) and Call [facility] & ck on f/u appt w/Dr. De Nurse, MD   I discussed the assessment and treatment plan with the patient and/or parent/guardian. They were provided an opportunity to ask questions and all were answered. They agreed with the plan and demonstrated an understanding of the instructions.   They were advised to call back or seek an in-person evaluation if the symptoms worsen or if the condition fails to improve as anticipated.  Allen L  [place], LMFT

## 2021-06-22 ENCOUNTER — Other Ambulatory Visit: Payer: Self-pay | Admitting: *Deleted

## 2021-06-22 NOTE — Patient Instructions (Signed)
Visit Information  [name]  - as a part of your Medicaid benefit, you are eligible for care management and care coordination services at no cost or copay. I was unable to reach you by phone today but would be happy to help you with your health related needs. Please feel free to call me @ [phone].   A member of the Managed Medicaid care management team will reach out to you again over the next 14 days.   [name] RN, BSN [place] RN Care Coordinator

## 2021-06-22 NOTE — Patient Outreach (Signed)
Care Coordination  06/22/2021  ESLEY BROOKING Dec 25, 1965 [id number]   Medicaid Managed Care   Unsuccessful Outreach Note  06/22/2021 Name: YUAN GANN MRN: [id number] DOB: November 25, 1965  Referred by: [name], MD Reason for referral : High Risk Managed Medicaid (Unsuccessful RNCM follow up outreach)   An unsuccessful telephone outreach was attempted today. The patient was referred to the case management team for assistance with care management and care coordination.   Follow Up Plan: A HIPAA compliant phone message was left for the patient providing contact information and requesting a return call.   [name] RN, BSN [place] RN Care Coordinator

## 2021-06-24 ENCOUNTER — Telehealth: Payer: Self-pay | Admitting: Internal Medicine

## 2021-06-24 NOTE — Telephone Encounter (Signed)
.. °  Medicaid Managed Care   Unsuccessful Outreach Note  06/24/2021 Name: Allen Gould MRN: [id number] DOB: 09/13/1965  Referred by: [name], MD Reason for referral : High Risk Managed Medicaid (I called the patient today to get his phone visit with the MM LCSW rescheduled. I had to leave a message on his VM.)   An unsuccessful telephone outreach was attempted today. The patient was referred to the case management team for assistance with care management and care coordination.   Follow Up Plan: The care management team will reach out to the patient again over the next 10 days.   [place]

## 2021-06-25 ENCOUNTER — Ambulatory Visit: Payer: Medicaid Other | Admitting: Podiatry

## 2021-06-30 ENCOUNTER — Telehealth: Payer: Self-pay

## 2021-06-30 DIAGNOSIS — I2510 Atherosclerotic heart disease of native coronary artery without angina pectoris: Secondary | ICD-10-CM | POA: Insufficient documentation

## 2021-06-30 NOTE — Progress Notes (Signed)
Cardiology Office Note:    Date:  07/01/2021   ID:  [name], DOB Jul 26, 1965, MRN [id number]  PCP:  [name], MD  [facility] Providers Cardiologist:  Fransico Him, MD Electrophysiologist:  Vickie Epley, MD    Referring MD: [name], MD   Chief Complaint:  Follow-up for A-fib, hypertrophic cardiomyopathy, HFpEF    Patient Profile: Coronary artery disease Mild to mod non-obs Dz by cath at [facility] in 2018  CPET 1/23: Fxn limitation due to deconditioning, obesity, chrono incompetence Paroxysmal atrial fibrillation CHA2DS2-VASc=3 (HTN, Diab, CAD)  admx in 11/2018 at [facility] w scrotal abscess >> AF w RVR Prior anticoagulation with Apixaban, Rivaroxaban >> not continued after 4/21 admit due to hx of ETOH abuse, non-adherence  Anticoag later resumed with Apixaban Hypertrophic CM  PYP 7/22: equivocal SPEP/UPEP normal CMR: not suggestive of Amyloid; c/w hypertropic CM (HFpEF) heart failure with preserved ejection fraction  RBBB, LAFB Tachy-Brady syndrome 2nd degree AVB Type 1, 2 and 3rd degree AVB; junctional rhythm  S/p Pacemaker 06/2020 Needs Rate Response adjusted for chrono incompetence  Hx Alcohol abuse Morbid obesity OSA  Hypertension  Diabetes mellitus  Macrocytic anemia Admitted in 5/21 with starvation ketosis  Hepatic steatosis  Aortic atherosclerosis (CT in 05/2020)  Prior CV Studies: CPET 06/05/21 Moderate to severe functional limitation due primarily to obesity and deconditioning. There is also marked chronotropic incompetence in the setting of chronic AV pacing - consider adjustment of rate-response parameters if clinically indicated.   Exercise Echocardiogram 05/11/21 Pt could not walk on Treadmill Peak LVOT w Valsalva 16 mmHg  Cardiac MRI 02/03/21 EF 59 No cardiac amyloidosis C/w hypertrophic CM  Echocardiogram 09/29/20 EF 50-55, severe LVH, Gr 1 DD, normal RVSF, trivial MR, mild MS (mean 3 mmHg), AV sclerosis w/o AS, Ao root 42 mm,  ascending aorta 42 mm  Event monitor 12/21 NSR, nonsustained WCT (6 beats); nonsustained ATach (148 bpm), 2nd degree Type 1 and Type 2 AVB; 3rd degree AVB (2.2 sec) occurring during sleep   Echocardiogram 10/11/2019 EF 60-65, no RWMA, moderate LVH, normal RVSF, mild LAE, severe calcification of anterior mitral valve leaflet, trivial MR, mild MS, mild AS (mean gradient 12 mmHg), borderline dilatation of aortic root (39 mm)   Echocardiogram 11/30/2018 ([facility]) EF 60-65, BAE, mod LVH   Echocardiogram 07/01/2018 ([facility]) EF 60-65   Echocardiogram 05/30/2017 Moderate concentric LVH, EF 55-60, normal wall motion, GR 1 DD, aortic root 39 mm, mild MR   Cardiac catheterization 10/27/16 ([facility]) Report states:  "mild to mod non-obstructive dz"   Echocardiogram 10/25/16 ([facility]) EF 55-60, mod LVH, Gr 1 DD, mild LAE     History of Present Illness:   BLAIKE VICKERS is a 56 y.o. male with the above problem list.  He was last seen in clinic by Dr. Gasper Sells in 11/22.   He was set up for a exercise echocardiogram to assess for significant LVOT gradient.  He could not walk on a treadmill.  He did not have a significant gradient with Valsalva.  He was then set up for CPET which demonstrated limitation due to obesity and deconditioning as well as chronotropic incompetence.  He has an appt pending to increase the rate responsiveness for his pacemaker.  He returns for f/u.  He is here alone.  He lives at the [employer] near [employer].  He is remote device was misplaced and he has not been able to do remote pacemaker checks.  He is scheduled to see Dr. Quentin Ore  in the next week to change the rate response on his device.  He continues to have issues with dyspnea exertion.  He describes class IIb-III symptoms.  He has occasional chest discomfort.  He describes it as a pinprick.  He notices it when his heart rate has increased.  He experiences it sometimes after walking.  He has not had syncope, orthopnea,  leg edema.    Past Medical History:  Diagnosis Date   Acquired dilation of ascending aorta and aortic root ([place])    7mm by echo 09/2020 but normal on Chest CTA 05/2020   Acute kidney injury ([place]) 09/01/2019   Asthma    Balanitis 08/09/2014   Bifascicular block    Cough 08/09/2014   Diabetes mellitus without complication ([place])    Dilated aortic root ([place])    3mm on echo 09/2020   DM (diabetes mellitus) type 2, uncontrolled, without ketoacidosis 08/10/2013   Dyslipidemia 03/01/2014   Financial difficulties 08/09/2014   GERD (gastroesophageal reflux disease)    Health care maintenance 03/01/2014   HOCM (hypertrophic obstructive cardiomyopathy) ([place])    noted on cMRI but no SAM or LVOT gradient   Hypertension    Hypomagnesemia 05/29/2017   Insomnia 08/29/2013   Mitral stenosis    mean MVG 93mmHg   Morbid obesity ([place]) 08/29/2013   Near syncope 05/29/2017   Odontogenic infection of jaw 05/29/2017   PAC (premature atrial contraction)    PAF (paroxysmal atrial fibrillation) (HCC)    Pressure injury of skin 09/02/2019   PVCs (premature ventricular contractions)    Right shoulder pain 08/29/2013   Second degree AV block, Mobitz type I    Sepsis ([place]) 05/29/2017   Sleep apnea    uses cpap   Current Medications: Current Meds  Medication Sig   Accu-Chek Softclix Lancets lancets Check blood sugar 3 times a day   acetaminophen (TYLENOL) 500 MG tablet Take 500 mg by mouth every 6 (six) hours as needed (pain).   amLODipine (NORVASC) 10 MG tablet Take 1 tablet (10 mg total) by mouth daily.   apixaban (ELIQUIS) 5 MG TABS tablet Take 1 tablet (5 mg total) by mouth 2 (two) times daily.   aspirin 81 MG EC tablet Take 81 mg by mouth daily. Swallow whole.   busPIRone (BUSPAR) 7.5 MG tablet Take 1 tablet (7.5 mg total) by mouth daily.   clotrimazole-betamethasone (LOTRISONE) cream Apply 1 application topically 2 (two) times daily.   Dulaglutide (TRULICITY) 5.03 TW/6.5KC SOPN Inject 0.75 mg  into the skin once a week.   DULoxetine (CYMBALTA) 60 MG capsule TAKE 1 CAPSULE (60 MG TOTAL) BY MOUTH DAILY.   furosemide (LASIX) 40 MG tablet Take 40 mg by mouth every other day.   glucose blood (ACCU-CHEK GUIDE) test strip Check blood sugar 3 times a day   metFORMIN (GLUCOPHAGE) 500 MG tablet Take 1 tablet (500 mg total) by mouth 2 (two) times daily with a meal.   metoprolol succinate (TOPROL XL) 25 MG 24 hr tablet Take 1 tablet (25 mg total) by mouth at bedtime.   mirtazapine (REMERON) 7.5 MG tablet Take 1 tablet (7.5 mg total) by mouth at bedtime.   pantoprazole (PROTONIX) 40 MG tablet Take 1 tablet (40 mg total) by mouth daily.   valsartan (DIOVAN) 80 MG tablet TAKE 1 TABLET BY MOUTH DAILY.   vitamin B-12 (CYANOCOBALAMIN) 1000 MCG tablet Take 1,000 mcg by mouth daily.   [DISCONTINUED] atorvastatin (LIPITOR) 80 MG tablet Take 1 tablet (80 mg total) by mouth daily.  Allergies:   Shellfish allergy   Social History   Tobacco Use   Smoking status: Former    Types: Cigarettes    Quit date: 04/24/2005    Years since quitting: 16.1   Smokeless tobacco: Never  Vaping Use   Vaping Use: Never used  Substance Use Topics   Alcohol use: Not Currently    Alcohol/week: 2.0 standard drinks    Types: 2 Cans of beer per week    Comment: Last drink 8 months ago   Drug use: No    Family Hx: The patient's family history includes Diabetes in his sister; Heart disease in an other family member; Hyperlipidemia in his sister; Kidney disease in his mother.  Review of Systems  Gastrointestinal:  Negative for hematochezia.  Genitourinary:  Negative for hematuria.    EKGs/Labs/Other Test Reviewed:    EKG:  EKG is  ordered today.  The ekg ordered today demonstrates V paced, HR 64  Recent Labs: 04/01/2021: ALT 10 04/15/2021: Magnesium 1.6 05/07/2021: BUN 30; Creatinine, Ser 1.77; Hemoglobin 12.1; Platelets 222; Potassium 4.6; Sodium 134   Recent Lipid Panel No results for input(s): CHOL, TRIG,  HDL, VLDL, LDLCALC, LDLDIRECT in the last 8760 hours.   Risk Assessment/Calculations:    CHA2DS2-VASc Score = 4   This indicates a 4.8% annual risk of stroke. The patient's score is based upon: CHF History: 1 HTN History: 1 Diabetes History: 1 Stroke History: 0 Vascular Disease History: 1 Age Score: 0 Gender Score: 0        Physical Exam:    VS:  BP 118/80 (BP Location: Right Arm, Patient Position: Sitting, Cuff Size: Large)    Pulse 64    Ht 5\' 10"  (1.778 m)    Wt 291 lb 3.2 oz (132.1 kg)    SpO2 98%    BMI 41.78 kg/m     Wt Readings from Last 3 Encounters:  07/01/21 291 lb 3.2 oz (132.1 kg)  06/16/21 294 lb 6.4 oz (133.5 kg)  06/03/21 289 lb 3.2 oz (131.2 kg)    Constitutional:      Appearance: Healthy appearance. Not in distress.  Neck:     Vascular: JVD normal.  Pulmonary:     Effort: Pulmonary effort is normal.     Breath sounds: No wheezing. No rales.  Cardiovascular:     Normal rate. Regular rhythm. Normal S1. Normal S2.      Murmurs: There is no murmur.  Edema:    Peripheral edema absent.  Abdominal:     Palpations: Abdomen is soft.  Skin:    General: Skin is warm and dry.  Neurological:     General: No focal deficit present.     Mental Status: Alert and oriented to person, place and time.     Cranial Nerves: Cranial nerves are intact.        ASSESSMENT & PLAN:   CAD (coronary artery disease) History of nonobstructive disease by cardiac catheterization in 2018.  He had a recent cardiopulmonary stress test that demonstrated functional limitation due to obesity and deconditioning as well as chronotropic incompetence.  There was no cardiac or pulmonary limitation.  His chest discomfort sounds noncardiac.  He does have risk factors for coronary artery disease.  He does have an appointment with Dr. Quentin Ore to change the rate response on his pacemaker.  If he continues to have symptoms of chest discomfort after changing his rate response, we may need to consider  coronary CTA to reevaluate for coronary artery disease.  He does remain on aspirin in addition to apixaban.  He has not had PCI in the past.  I will review with Dr. Radford Pax to see if we can take him off of aspirin.  Continue atorvastatin 80 mg daily.  Follow-up with Dr. Radford Pax in 3 months.  Hypertrophic cardiomyopathy ([place]) His work-up for amyloid was negative.  He does have hypertrophic cardiomyopathy features on cardiac MRI.  His echo in December did not demonstrate significant LVOT gradient with Valsalva.  As noted, his cardiopulmonary stress test did not demonstrate any cardiac or pulmonary limitations.  Continue metoprolol succinate 25 mg daily.  Essential hypertension Blood pressure is well controlled.  Continue amlodipine 10 mg daily, metoprolol succinate 25 mg daily, valsartan 80 mg daily.  Acquired dilation of ascending aorta and aortic root (HCC) 42 mm by echocardiogram in May 2022.  Cardiac MRI in September 2022 demonstrated normal size cardiac root and ascending aorta.  We could consider repeating his echocardiogram in a year.  If he does ultimately require coronary CTA, this will also help resize his aorta.  Blood pressures well controlled.  Tachycardia-bradycardia syndrome ([place]) Status post pacemaker.  Follow-up with EP as planned.  As noted, he has an appointment with EP to change rate response on his device.  PAF (paroxysmal atrial fibrillation) (HCC) It is difficult to assess his underlying rhythm on electrocardiogram today.  He is currently V paced.  Heart rate is controlled.  In 12/22, creatinine 1.77, Hgb 12.1.  Continue apixaban 5 mg twice daily.  (HFpEF) heart failure with preserved ejection fraction (HCC) Overall, volume status appears to be stable on exam.  He describes NYHA IIb-III symptoms.  Recent cardiopulmonary stress test does not demonstrate significant cardiac or pulmonary limitations.  Continue furosemide 40 mg every other day.  If shortness of breath does not  improve with rate adjustment on his device, consider adding MRA, SGLT2 inhibitor.           Dispo:  Return in about 3 months (around 09/28/2021) for Routine follow up 3 months with Dr. Radford Pax. .   Medication Adjustments/Labs and Tests Ordered: Current medicines are reviewed at length with the patient today.  Concerns regarding medicines are outlined above.  Tests Ordered: Orders Placed This Encounter  Procedures   EKG 12-Lead   Medication Changes: No orders of the defined types were placed in this encounter.  Signed, Richardson Dopp, PA-C  07/01/2021 5:44 PM    [facility] [place], [place], [place]  [address] Phone: [phone]; Fax: [phone]

## 2021-06-30 NOTE — Telephone Encounter (Signed)
Patient called in stating he is living in a shelter and they have misplaced his monitor. Patient has a upcoming appointment and wants to talk to doc to see what he should do next. Patient might need to come into the office to get 6 month checks in office if the monitor wont suffice

## 2021-07-01 ENCOUNTER — Ambulatory Visit (INDEPENDENT_AMBULATORY_CARE_PROVIDER_SITE_OTHER): Payer: Medicaid Other | Admitting: Physician Assistant

## 2021-07-01 ENCOUNTER — Telehealth: Payer: Self-pay | Admitting: *Deleted

## 2021-07-01 ENCOUNTER — Encounter: Payer: Self-pay | Admitting: Physician Assistant

## 2021-07-01 ENCOUNTER — Other Ambulatory Visit: Payer: Self-pay | Admitting: Internal Medicine

## 2021-07-01 ENCOUNTER — Other Ambulatory Visit: Payer: Self-pay

## 2021-07-01 VITALS — BP 118/80 | HR 64 | Ht 70.0 in | Wt 291.2 lb

## 2021-07-01 DIAGNOSIS — I480 Paroxysmal atrial fibrillation: Secondary | ICD-10-CM

## 2021-07-01 DIAGNOSIS — I2510 Atherosclerotic heart disease of native coronary artery without angina pectoris: Secondary | ICD-10-CM

## 2021-07-01 DIAGNOSIS — I422 Other hypertrophic cardiomyopathy: Secondary | ICD-10-CM

## 2021-07-01 DIAGNOSIS — I25119 Atherosclerotic heart disease of native coronary artery with unspecified angina pectoris: Secondary | ICD-10-CM | POA: Diagnosis not present

## 2021-07-01 DIAGNOSIS — I5032 Chronic diastolic (congestive) heart failure: Secondary | ICD-10-CM | POA: Diagnosis not present

## 2021-07-01 DIAGNOSIS — I77819 Aortic ectasia, unspecified site: Secondary | ICD-10-CM | POA: Diagnosis not present

## 2021-07-01 DIAGNOSIS — I10 Essential (primary) hypertension: Secondary | ICD-10-CM

## 2021-07-01 DIAGNOSIS — I495 Sick sinus syndrome: Secondary | ICD-10-CM

## 2021-07-01 NOTE — Assessment & Plan Note (Signed)
42 mm by echocardiogram in May 2022.  Cardiac MRI in September 2022 demonstrated normal size cardiac root and ascending aorta.  We could consider repeating his echocardiogram in a year.  If he does ultimately require coronary CTA, this will also help resize his aorta.  Blood pressures well controlled.

## 2021-07-01 NOTE — Assessment & Plan Note (Signed)
Blood pressure is well controlled.  Continue amlodipine 10 mg daily, metoprolol succinate 25 mg daily, valsartan 80 mg daily.

## 2021-07-01 NOTE — Assessment & Plan Note (Signed)
History of nonobstructive disease by cardiac catheterization in 2018.  He had a recent cardiopulmonary stress test that demonstrated functional limitation due to obesity and deconditioning as well as chronotropic incompetence.  There was no cardiac or pulmonary limitation.  His chest discomfort sounds noncardiac.  He does have risk factors for coronary artery disease.  He does have an appointment with Dr. Quentin Ore to change the rate response on his pacemaker.  If he continues to have symptoms of chest discomfort after changing his rate response, we may need to consider coronary CTA to reevaluate for coronary artery disease.  He does remain on aspirin in addition to apixaban.  He has not had PCI in the past.  I will review with Dr. Radford Pax to see if we can take him off of aspirin.  Continue atorvastatin 80 mg daily.  Follow-up with Dr. Radford Pax in 3 months.

## 2021-07-01 NOTE — Assessment & Plan Note (Signed)
Status post pacemaker.  Follow-up with EP as planned.  As noted, he has an appointment with EP to change rate response on his device.

## 2021-07-01 NOTE — Assessment & Plan Note (Signed)
Overall, volume status appears to be stable on exam.  He describes NYHA IIb-III symptoms.  Recent cardiopulmonary stress test does not demonstrate significant cardiac or pulmonary limitations.  Continue furosemide 40 mg every other day.  If shortness of breath does not improve with rate adjustment on his device, consider adding MRA, SGLT2 inhibitor.

## 2021-07-01 NOTE — Assessment & Plan Note (Addendum)
It is difficult to assess his underlying rhythm on electrocardiogram today.  He is currently V paced.  Heart rate is controlled.  In 12/22, creatinine 1.77, Hgb 12.1.  Continue apixaban 5 mg twice daily.

## 2021-07-01 NOTE — Telephone Encounter (Signed)
-----   Message from Tamsen Snider sent at 07/01/2021 11:31 AM EST ----- Please set up pt for [employer] transportation.    Thanks [employer]

## 2021-07-01 NOTE — Telephone Encounter (Signed)
Pt has been scheduled for transportation to his upcoming appt 07/08/21.    Ride Scheduled! Roundtrip Ride ID:  [id number]  Transportation Type:  STANDARD VEHICLE: DOOR-TO-DOOR  Pickup Date/Time:  07/08/21 at 1:00 PM (EST)  [facility] Address  [address], [place] [address]  [facility] Address  [address], [place]  Return Leg Ride ID:  [id number]  [facility] Date/Time:  Will Call  [facility]  [address] [place], [place] [address], [place] Drop-off Address  [address] [place], [place] [address], [place]

## 2021-07-01 NOTE — Assessment & Plan Note (Addendum)
His work-up for amyloid was negative.  He does have hypertrophic cardiomyopathy features on cardiac MRI.  His echo in December did not demonstrate significant LVOT gradient with Valsalva.  As noted, his cardiopulmonary stress test did not demonstrate any cardiac or pulmonary limitations.  Continue metoprolol succinate 25 mg daily.

## 2021-07-01 NOTE — Patient Instructions (Signed)
Medication Instructions:   Your physician recommends that you continue on your current medications as directed. Please refer to the Current Medication list given to you today.  *If you need a refill on your cardiac medications before your next appointment, please call your pharmacy*   Lab Work:  None ordered.  If you have labs (blood work) drawn today and your tests are completely normal, you will receive your results only by: [place] (if you have MyChart) OR A paper copy in the mail If you have any lab test that is abnormal or we need to change your treatment, we will call you to review the results.   Testing/Procedures:  None ordered.   Follow-Up: At [facility], you and your health needs are our priority.  As part of our continuing mission to provide you with exceptional heart care, we have created designated Provider Care Teams.  These Care Teams include your primary Cardiologist (physician) and Advanced Practice Providers (APPs -  Physician Assistants and Nurse Practitioners) who all work together to provide you with the care you need, when you need it.  We recommend signing up for the patient portal called "MyChart".  Sign up information is provided on this After Visit Summary.  MyChart is used to connect with patients for Virtual Visits (Telemedicine).  Patients are able to view lab/test results, encounter notes, upcoming appointments, etc.  Non-urgent messages can be sent to your provider as well.   To learn more about what you can do with MyChart, go to [email or web address].    Your next appointment:   3 month(s)  The format for your next appointment:   In Person  Provider:   Fransico Him, MD     Other Instructions  Please keep appointment with Dr. Quentin Ore.

## 2021-07-03 ENCOUNTER — Telehealth: Payer: Self-pay | Admitting: *Deleted

## 2021-07-03 NOTE — Telephone Encounter (Signed)
Lvm with recommendations to Discontinue Asprin.  Medication list updated.

## 2021-07-03 NOTE — Telephone Encounter (Signed)
-----   Message from Liliane Shi, [place] sent at 07/02/2021  8:33 AM EST ----- Regarding: FW: Aspirin Can you let him know to DC the ASA? Thanks Event organiser  ----- Message ----- From: [name], MD Sent: 07/02/2021   8:33 AM EST To: Liliane Shi, PA-C Subject: RE: Aspirin                                    Okay to stop aspirin ----- Message ----- From: Sharmon Revere Sent: 07/01/2021   5:33 PM EST To: [name], MD Subject: Aspirin                                        Traci He is on aspirin and Eliquis.  Is any reason that we cannot stop his aspirin? Thanks [employer]

## 2021-07-06 ENCOUNTER — Other Ambulatory Visit: Payer: Self-pay

## 2021-07-06 ENCOUNTER — Ambulatory Visit (INDEPENDENT_AMBULATORY_CARE_PROVIDER_SITE_OTHER): Payer: Medicaid Other | Admitting: Podiatry

## 2021-07-06 ENCOUNTER — Encounter: Payer: Self-pay | Admitting: Podiatry

## 2021-07-06 DIAGNOSIS — M79674 Pain in right toe(s): Secondary | ICD-10-CM

## 2021-07-06 DIAGNOSIS — B351 Tinea unguium: Secondary | ICD-10-CM | POA: Diagnosis not present

## 2021-07-06 DIAGNOSIS — M79675 Pain in left toe(s): Secondary | ICD-10-CM | POA: Diagnosis not present

## 2021-07-06 DIAGNOSIS — E1142 Type 2 diabetes mellitus with diabetic polyneuropathy: Secondary | ICD-10-CM

## 2021-07-06 DIAGNOSIS — Z794 Long term (current) use of insulin: Secondary | ICD-10-CM

## 2021-07-06 NOTE — Progress Notes (Signed)
°  Subjective:  Patient ID: ASON HESLIN, male    DOB: [birth year],   MRN: [id number]  Chief Complaint  Patient presents with   Nail Problem    Nail trim     56 y.o. male presents for concern of thickened elongated and painful nails that are difficult to trim. Requesting to have them trimmed today. Denies any burning or tingling in her feet. Patient is diabetic and last A1c was 6.   . Denies any other pedal complaints. Denies n/v/f/c.   PCP: Asencion Noble MD   Past Medical History:  Diagnosis Date   Acquired dilation of ascending aorta and aortic root ([place])    42mm by echo 09/2020 but normal on Chest CTA 05/2020   Acute kidney injury ([place]) 09/01/2019   Asthma    Balanitis 08/09/2014   Bifascicular block    Cough 08/09/2014   Diabetes mellitus without complication ([place])    Dilated aortic root ([place])    53mm on echo 09/2020   DM (diabetes mellitus) type 2, uncontrolled, without ketoacidosis 08/10/2013   Dyslipidemia 03/01/2014   Financial difficulties 08/09/2014   GERD (gastroesophageal reflux disease)    Health care maintenance 03/01/2014   HOCM (hypertrophic obstructive cardiomyopathy) ([place])    noted on cMRI but no SAM or LVOT gradient   Hypertension    Hypomagnesemia 05/29/2017   Insomnia 08/29/2013   Mitral stenosis    mean MVG 90mmHg   Morbid obesity ([place]) 08/29/2013   Near syncope 05/29/2017   Odontogenic infection of jaw 05/29/2017   PAC (premature atrial contraction)    PAF (paroxysmal atrial fibrillation) (HCC)    Pressure injury of skin 09/02/2019   PVCs (premature ventricular contractions)    Right shoulder pain 08/29/2013   Second degree AV block, Mobitz type I    Sepsis ([place]) 05/29/2017   Sleep apnea    uses cpap    Objective:  Physical Exam: Vascular: DP/PT pulses 2/4 bilateral. CFT <3 seconds. Absent hair growth noted. No edema.  Skin. No lacerations or abrasions bilateral feet. Nails 1-5 are thickened discolored and elongated with subungual debris.   Musculoskeletal: MMT 5/5 bilateral lower extremities in DF, PF, Inversion and Eversion. Deceased ROM in DF of ankle joint.  Neurological: Sensation intact to light touch. Protective sensation absent.   Assessment:   1. Type 2 diabetes mellitus with diabetic polyneuropathy, with long-term current use of insulin (HCC)   2. Pain due to onychomycosis of toenails of both feet      Plan:  Patient was evaluated and treated and all questions answered. -Discussed and educated patient on diabetic foot care, especially with  regards to the vascular, neurological and musculoskeletal systems.  -Stressed the importance of good glycemic control and the detriment of not  controlling glucose levels in relation to the foot. -Discussed supportive shoes at all times and checking feet regularly.  -Mechanically debrided all nails 1-5 bilateral using sterile nail nipper and filed with dremel without incident  -Answered all patient questions -Patient to return  in 3 months for at risk foot care -Patient advised to call the office if any problems or questions arise in the meantime.   Lorenda Peck, DPM

## 2021-07-08 ENCOUNTER — Telehealth: Payer: Self-pay

## 2021-07-08 NOTE — Telephone Encounter (Signed)
Patient called to reschedule device appt

## 2021-07-14 ENCOUNTER — Institutional Professional Consult (permissible substitution): Payer: Medicaid Other | Admitting: Behavioral Health

## 2021-07-14 ENCOUNTER — Telehealth (HOSPITAL_COMMUNITY): Payer: Medicaid Other | Admitting: Psychiatry

## 2021-07-14 ENCOUNTER — Telehealth: Payer: Self-pay | Admitting: Behavioral Health

## 2021-07-14 NOTE — Telephone Encounter (Signed)
Lft msg for Pt to [facility] to [facility] & r/s @ his convenience.  Dr. Theodis Shove

## 2021-07-20 ENCOUNTER — Other Ambulatory Visit (HOSPITAL_COMMUNITY): Payer: Self-pay | Admitting: Psychiatry

## 2021-08-04 ENCOUNTER — Telehealth: Payer: Self-pay | Admitting: Behavioral Health

## 2021-08-04 NOTE — Telephone Encounter (Signed)
Lft msg for Pt concerning touching base on his mental health wellness needs & f/u on the status of his Disability, housing, food needs. Promoted Pt to [facility] to [facility] & speak w/Clinician @ his convenience.  ? ?Dr. Theodis Shove ?

## 2021-08-12 ENCOUNTER — Encounter: Payer: Self-pay | Admitting: Physician Assistant

## 2021-08-12 NOTE — Progress Notes (Signed)
Pt seen by Dr Joya Gaskins.  ? 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  ? ?He had to work very hard, over 12 hr/day for 2 days. He was supposed to be supervising, but had to stand. They don't allow chairs.  ? ?His back has bothered him since then. He needs Lidocaine patches and Tylenol. He needs a cane, the rollator is too inconvenient.  ? ?119/69, 69,  100% ? ?He is to ask the S.W. how much he can make w/out affecting his disability benefits, to see if it is ok for him to work.  ? ?Rosaria Ferries, PA-C ?08/12/2021 ?4:09 PM ? ? ? ? ? ?

## 2021-08-19 ENCOUNTER — Ambulatory Visit (HOSPITAL_COMMUNITY): Payer: Medicaid Other | Admitting: Licensed Clinical Social Worker

## 2021-09-03 ENCOUNTER — Other Ambulatory Visit: Payer: Self-pay | Admitting: Internal Medicine

## 2021-09-03 DIAGNOSIS — E119 Type 2 diabetes mellitus without complications: Secondary | ICD-10-CM

## 2021-09-07 ENCOUNTER — Ambulatory Visit: Payer: Medicaid Other | Admitting: Behavioral Health

## 2021-09-07 DIAGNOSIS — F331 Major depressive disorder, recurrent, moderate: Secondary | ICD-10-CM

## 2021-09-07 DIAGNOSIS — F419 Anxiety disorder, unspecified: Secondary | ICD-10-CM

## 2021-09-07 NOTE — BH Specialist Note (Signed)
Integrated Behavioral Health via Telemedicine Visit ? ?09/07/2021 ?Allen Gould ?[id number] ? ?Number of [place] Clinician visits: 10 ?Session Start time: 1000 ?Session End time: 6579 ?Total time in minutes: 30 min ? ?Referring Provider: Dr. Verlin Dike, MD ?Patient/Family location: Pt is sitting in [place] ?[facility] Provider location: [facility] Office ?All persons participating in visit: Pt & Clinician ?Types of Service: Individual psychotherapy ? ?I connected with Allen Gould and/or [name]'s  self  via  Animal nutritionist  (Video is Tree surgeon) and verified that I am speaking with the correct person using two identifiers. Discussed confidentiality: Yes  ? ?I discussed the limitations of telemedicine and the availability of in person appointments.  Discussed there is a possibility of technology failure and discussed alternative modes of communication if that failure occurs. ? ?I discussed that engaging in this telemedicine visit, they consent to the provision of behavioral healthcare and the services will be billed under their insurance. ? ?Patient and/or legal guardian expressed understanding and consented to Telemedicine visit: Yes  ? ?Presenting Concerns: ?Patient and/or family reports the following symptoms/concerns: elevated anx/dep due to the lack of his SSI-D coming through yet which has stalled his progress ?Duration of problem: months; Severity of problem: moderate ? ?Patient and/or Family's Strengths/Protective Factors: ?Social and Emotional competence, Concrete supports in place (healthy food, safe environments, etc.), Sense of purpose, and Physical Health (exercise, healthy diet, medication compliance, etc.) ? ?Goals Addressed: ?Patient will: ? Reduce symptoms of: anxiety, depression, stress, and some paranoid thinking processes   ? Increase knowledge and/or ability of: coping skills, healthy habits, and stress reduction  ?  Demonstrate ability to: Increase healthy adjustment to current life circumstances ? ?Progress towards Goals: ?Ongoing ? ?Interventions: ?Interventions utilized:  Solution-Focused Strategies and Supportive Counseling ?Standardized Assessments completed:  screeners prn ? ?Patient and/or Family Response: Pt is receptive to call today w/limited privacy disclosed near mid-session ? ?Assessment: ?Patient currently experiencing elevated anx/dep due to current circumstances. Pt's hopefulness has reduced since last visit bc he is mired in the details of rec'g his SSI-D check in 2-4 wks. ? ?Pt is unhappy w/the way the shelter Director has handled his dec to move out w/2 friends soon. He feels his money was mis-managed & he is suspicious of [employer] who do not seem to have his best interests @ heart. ? ?Call disconnected @ end of session on Pt side. ? ?Patient may benefit from cont'd Cslg @ his discretion. ? ?Plan: ?Follow up with behavioral health clinician on : Next avail in one mos for 30 min telehealth ?Behavioral recommendations: None today ?Referral(s): Armed forces logistics/support/administrative officer (LME/Outside Clinic) ? ?I discussed the assessment and treatment plan with the patient and/or parent/guardian. They were provided an opportunity to ask questions and all were answered. They agreed with the plan and demonstrated an understanding of the instructions. ?  ?They were advised to call back or seek an in-person evaluation if the symptoms worsen or if the condition fails to improve as anticipated. ? ?Donnetta Hutching, LMFT ?

## 2021-09-17 DIAGNOSIS — R079 Chest pain, unspecified: Secondary | ICD-10-CM | POA: Insufficient documentation

## 2021-09-17 NOTE — Progress Notes (Deleted)
Office Visit   Patient ID: Allen Gould, male    DOB: Sep 14, 1965, 56 y.o.   MRN: [id number]   PCP: [name], MD   Subjective:   Allen Gould is a 56 y.o. year old male with hypertension, hyperlipidemia, CHF, afib, diabetes, and mood disorder who presents for follow up of his chronic medical conditions.   LOV with me was in January however, due to more pressing mental health issues, we were unable to discuss his other chronic medical conditions.   Interm hx 1/13: exercise stress test--poor functional capacity. Reported hip pain and poor fitting shoes>>referred to podiatry 1/24:  orthopedics--bilateral hip joint injections 2/8: cardiology--aspirin discontinued 2/13: podiatry--nail debridement  He has required multiple appt cancellations due to lack of transportation.  ACTIVE MEDICATIONS   Outpatient Medications Prior to Visit  Medication Sig   Accu-Chek Softclix Lancets lancets Check blood sugar 3 times a day   acetaminophen (TYLENOL) 500 MG tablet Take 500 mg by mouth every 6 (six) hours as needed (pain).   amLODipine (NORVASC) 10 MG tablet Take 1 tablet (10 mg total) by mouth daily.   apixaban (ELIQUIS) 5 MG TABS tablet Take 1 tablet (5 mg total) by mouth 2 (two) times daily.   atorvastatin (LIPITOR) 80 MG tablet TAKE 1 TABLET (80 MG TOTAL) BY MOUTH DAILY.   busPIRone (BUSPAR) 7.5 MG tablet TAKE 1 TABLET (7.5 MG TOTAL) BY MOUTH DAILY.   clotrimazole-betamethasone (LOTRISONE) cream Apply 1 application topically 2 (two) times daily.   Dulaglutide (TRULICITY) 8.93 YB/0.1BP SOPN Inject 0.75 mg into the skin once a week.   DULoxetine (CYMBALTA) 60 MG capsule TAKE 1 CAPSULE (60 MG TOTAL) BY MOUTH DAILY.   furosemide (LASIX) 40 MG tablet Take 40 mg by mouth every other day.   gabapentin (NEURONTIN) 300 MG capsule TAKE 1 CAPSULE (300 MG TOTAL) BY MOUTH IN THE MORNING, AT NOON, AND AT BEDTIME.   glucose blood (ACCU-CHEK GUIDE) test strip Check blood sugar 3 times a day    metFORMIN (GLUCOPHAGE) 500 MG tablet TAKE 1 TABLET (500 MG TOTAL) BY MOUTH 2 (TWO) TIMES DAILY WITH A MEAL.   metoprolol succinate (TOPROL XL) 25 MG 24 hr tablet Take 1 tablet (25 mg total) by mouth at bedtime.   mirtazapine (REMERON) 7.5 MG tablet TAKE 1 TABLET (7.5 MG TOTAL) BY MOUTH AT BEDTIME.   pantoprazole (PROTONIX) 40 MG tablet TAKE 1 TABLET (40 MG TOTAL) BY MOUTH DAILY.   valsartan (DIOVAN) 80 MG tablet TAKE 1 TABLET BY MOUTH DAILY.   vitamin B-12 (CYANOCOBALAMIN) 1000 MCG tablet Take 1,000 mcg by mouth daily.   No facility-administered medications prior to visit.     Objective:   There were no vitals taken for this visit. Wt Readings from Last 3 Encounters:  07/01/21 291 lb 3.2 oz (132.1 kg)  06/16/21 294 lb 6.4 oz (133.5 kg)  06/03/21 289 lb 3.2 oz (131.2 kg)    BP Readings from Last 3 Encounters:  08/12/21 119/69  07/01/21 118/80  06/10/21 124/72     Health Maintenance:   Health Maintenance  Topic Date Due   COVID-19 Vaccine (1) Never done   Zoster Vaccines- Shingrix (1 of 2) Never done   HEMOGLOBIN A1C  07/02/2021   OPHTHALMOLOGY EXAM  09/03/2021   COLONOSCOPY (Pts 45-53yr Insurance coverage will need to be confirmed)  11/13/2021 (Originally 02/24/2009)   TETANUS/TDAP  11/13/2021 (Originally 02/25/1983)   LIPID PANEL  05/25/2022 (Originally 04/08/2021)   FOOT EXAM  10/01/2021   INFLUENZA  VACCINE  12/22/2021   Hepatitis C Screening  Completed   HIV Screening  Completed   HPV VACCINES  Aged Out    Assessment & Plan:   Problem List Items Addressed This Visit   None    Follow up 3 months for diabetes recheck Follow up with podiatry 5/15 Follow up with cardiology 6/13   Pt discussed with ***  [name], MD Internal Medicine Resident PGY-3 [facility] 09/17/2021 10:17 AM

## 2021-09-17 NOTE — Assessment & Plan Note (Deleted)
PYP 7/22: equivocal SPEP/UPEP normal CMR: not suggestive of Amyloid; c/w hypertropic CM ECHO 04/2021: no significant gradient with valsalva

## 2021-09-23 ENCOUNTER — Encounter: Payer: Medicaid Other | Admitting: Internal Medicine

## 2021-09-23 DIAGNOSIS — I5032 Chronic diastolic (congestive) heart failure: Secondary | ICD-10-CM

## 2021-09-23 DIAGNOSIS — I10 Essential (primary) hypertension: Secondary | ICD-10-CM

## 2021-09-23 DIAGNOSIS — E1142 Type 2 diabetes mellitus with diabetic polyneuropathy: Secondary | ICD-10-CM

## 2021-09-23 DIAGNOSIS — I422 Other hypertrophic cardiomyopathy: Secondary | ICD-10-CM

## 2021-09-23 DIAGNOSIS — R079 Chest pain, unspecified: Secondary | ICD-10-CM

## 2021-09-28 ENCOUNTER — Encounter: Payer: Medicaid Other | Admitting: Internal Medicine

## 2021-10-05 ENCOUNTER — Encounter: Payer: Self-pay | Admitting: Podiatry

## 2021-10-05 ENCOUNTER — Ambulatory Visit (INDEPENDENT_AMBULATORY_CARE_PROVIDER_SITE_OTHER): Payer: Medicaid Other | Admitting: Podiatry

## 2021-10-05 DIAGNOSIS — M79675 Pain in left toe(s): Secondary | ICD-10-CM | POA: Diagnosis not present

## 2021-10-05 DIAGNOSIS — B351 Tinea unguium: Secondary | ICD-10-CM

## 2021-10-05 DIAGNOSIS — E1142 Type 2 diabetes mellitus with diabetic polyneuropathy: Secondary | ICD-10-CM | POA: Diagnosis not present

## 2021-10-05 DIAGNOSIS — M79674 Pain in right toe(s): Secondary | ICD-10-CM | POA: Diagnosis not present

## 2021-10-05 DIAGNOSIS — Z794 Long term (current) use of insulin: Secondary | ICD-10-CM

## 2021-10-05 DIAGNOSIS — E1141 Type 2 diabetes mellitus with diabetic mononeuropathy: Secondary | ICD-10-CM

## 2021-10-05 NOTE — Progress Notes (Signed)
This patient returns to my office for at risk foot care.  This patient requires this care by a professional since this patient will be at risk due to having diabetes.    This patient is unable to cut nails himself since the patient cannot reach his nails.These nails are painful walking and wearing shoes.  This patient presents for at risk foot care today. ? ?General Appearance  Alert, conversant and in no acute stress. ? ?Vascular  Dorsalis pedis and posterior tibial  pulses are palpable  bilaterally.  Capillary return is within normal limits  bilaterally. Temperature is within normal limits  bilaterally. ? ?Neurologic  Senn-Weinstein monofilament wire test absent  bilaterally. Muscle power within normal limits bilaterally. ? ?Nails Thick disfigured discolored nails with subungual debris  from hallux to fifth toes bilaterally. No evidence of bacterial infection or drainage bilaterally. ? ?Orthopedic  No limitations of motion  feet .  No crepitus or effusions noted.  No bony pathology or digital deformities noted. ? ?Skin  normotropic skin with no porokeratosis noted bilaterally.  No signs of infections or ulcers noted.    ? ?Onychomycosis  Pain in right toes  Pain in left toes ? ?Consent was obtained for treatment procedures.   Mechanical debridement of nails 1-5  bilaterally performed with a nail nipper.  Filed with dremel without incident.  ? ? ?Return office visit     3 months                 Told patient to return for periodic foot care and evaluation due to potential at risk complications. ? ? ?Gardiner Barefoot DPM   ?

## 2021-10-06 ENCOUNTER — Encounter: Payer: Self-pay | Admitting: Internal Medicine

## 2021-10-06 ENCOUNTER — Other Ambulatory Visit: Payer: Self-pay | Admitting: Internal Medicine

## 2021-10-06 ENCOUNTER — Ambulatory Visit (INDEPENDENT_AMBULATORY_CARE_PROVIDER_SITE_OTHER): Payer: Medicare Other | Admitting: Internal Medicine

## 2021-10-06 VITALS — BP 118/80 | HR 85 | Temp 97.9°F | Ht 70.0 in | Wt 288.5 lb

## 2021-10-06 DIAGNOSIS — F322 Major depressive disorder, single episode, severe without psychotic features: Secondary | ICD-10-CM

## 2021-10-06 DIAGNOSIS — Z7985 Long-term (current) use of injectable non-insulin antidiabetic drugs: Secondary | ICD-10-CM

## 2021-10-06 DIAGNOSIS — I10 Essential (primary) hypertension: Secondary | ICD-10-CM

## 2021-10-06 DIAGNOSIS — Z6841 Body Mass Index (BMI) 40.0 and over, adult: Secondary | ICD-10-CM

## 2021-10-06 DIAGNOSIS — M25552 Pain in left hip: Secondary | ICD-10-CM

## 2021-10-06 DIAGNOSIS — E1122 Type 2 diabetes mellitus with diabetic chronic kidney disease: Secondary | ICD-10-CM | POA: Diagnosis present

## 2021-10-06 DIAGNOSIS — E119 Type 2 diabetes mellitus without complications: Secondary | ICD-10-CM

## 2021-10-06 DIAGNOSIS — Z794 Long term (current) use of insulin: Secondary | ICD-10-CM | POA: Diagnosis not present

## 2021-10-06 DIAGNOSIS — N1831 Chronic kidney disease, stage 3a: Secondary | ICD-10-CM | POA: Diagnosis not present

## 2021-10-06 DIAGNOSIS — I5032 Chronic diastolic (congestive) heart failure: Secondary | ICD-10-CM | POA: Diagnosis not present

## 2021-10-06 DIAGNOSIS — M25551 Pain in right hip: Secondary | ICD-10-CM | POA: Diagnosis not present

## 2021-10-06 DIAGNOSIS — I130 Hypertensive heart and chronic kidney disease with heart failure and stage 1 through stage 4 chronic kidney disease, or unspecified chronic kidney disease: Secondary | ICD-10-CM

## 2021-10-06 LAB — GLUCOSE, CAPILLARY: Glucose-Capillary: 115 mg/dL — ABNORMAL HIGH (ref 70–99)

## 2021-10-06 LAB — POCT GLYCOSYLATED HEMOGLOBIN (HGB A1C): Hemoglobin A1C: 5.5 % (ref 4.0–5.6)

## 2021-10-06 MED ORDER — METFORMIN HCL 500 MG PO TABS: 500.0000 mg | ORAL_TABLET | Freq: Two times a day (BID) | ORAL | 1 refills | Status: DC

## 2021-10-06 MED ORDER — AMLODIPINE BESYLATE 10 MG PO TABS: 10.0000 mg | ORAL_TABLET | Freq: Every day | ORAL | 1 refills | Status: AC

## 2021-10-06 MED ORDER — FUROSEMIDE 40 MG PO TABS: 40.0000 mg | ORAL_TABLET | ORAL | 1 refills | Status: DC

## 2021-10-06 MED ORDER — DULOXETINE HCL 60 MG PO CPEP: 60.0000 mg | ORAL_CAPSULE | Freq: Every day | ORAL | 2 refills | Status: DC

## 2021-10-06 MED ORDER — VALSARTAN 80 MG PO TABS: 80.0000 mg | ORAL_TABLET | Freq: Every day | ORAL | 1 refills | Status: DC

## 2021-10-06 MED ORDER — METOPROLOL SUCCINATE ER 25 MG PO TB24: 25.0000 mg | ORAL_TABLET | Freq: Every day | ORAL | 1 refills | Status: AC

## 2021-10-06 NOTE — Assessment & Plan Note (Addendum)
Current medications: metformin 519m twice daily, Trulicity 08.40mg weekly, gabapentin 300 mg 3 times daily ?Assessment: Diabetes is well controlled. A1C is 5.5.  Denies adverse medication effects. ?Plan ?? Continue current management ?? Diabetic foot exam performed in the office today ?? He is aware of importance of yearly retinopathy screening ?? Follow-up in 3 months ?

## 2021-10-06 NOTE — Assessment & Plan Note (Signed)
Reports ongoing hip pain, left worse than right.  Supposedly needs to lose another 13 pounds in order to qualify for total hip replacement.  He has been taking Tylenol.  Due to his kidney disease, he knows to avoid NSAIDs.  He will follow-up with orthopedics to undergo another steroid injection. ?

## 2021-10-06 NOTE — Assessment & Plan Note (Signed)
Current medications: Cymbalta 60 mg daily.  BuSpar and Remeron are on his medicine list as well however dispense report shows he has not picked these up in a few months. ?PHQ-9 is 18 today, increased from 14 in January.  His mood is incongruent with this increase.  He actually feels like things are going pretty well.  He recently was able to move out of the homeless shelter and is now residing in a boardinghouse.  Additionally, he won his SSI case and will start receiving payments through that starting in June.  This is a great relief to him as this was causing him a lot of anxiety in the past.  Additionally, he has a new job which she is excited about.  No SI/HI today. ?

## 2021-10-06 NOTE — Progress Notes (Addendum)
? ?Office Visit ? ? Patient ID: Allen Gould, male    DOB: 07/22/1965, 56 y.o.   MRN: [id number]   PCP: [name], MD  ? ?Subjective:  ? ?Allen Gould is a 56 y.o. year old male who presents for follow up of type 2 diabetes. Please refer to problem based charting for assessment and plan. ? ? ?ACTIVE MEDICATIONS  ? ?Amlodipine 10 mg daily ?Valsartan 80 mg daily ?Metoprolol succinate 25 mg daily ?Lasix 40 mg every other day ?Eliquis 5 mg twice daily ?Lipitor 80 mg daily ?BuSpar 7.5 mg daily ?Cymbalta 60 mg daily ?Gabapentin 300 mg 3 times a day ?Mirtazapine (Remeron) 7.5 mg nightly  ?Protonix 40 mg daily ?Trulicity 4.58 mg weekly ?Metformin 500 mg twice daily ? ?Objective:  ? ?BP 118/80 (BP Location: Right Arm, Patient Position: Sitting, Cuff Size: Small)   Pulse 85   Temp 97.9 ?F (36.6 ?C) (Oral)   Ht 5' 10"  (1.778 m)   Wt 288 lb 8 oz (130.9 kg)   SpO2 98%   BMI 41.40 kg/m?  ?Wt Readings from Last 3 Encounters:  ?10/06/21 288 lb 8 oz (130.9 kg)  ?07/01/21 291 lb 3.2 oz (132.1 kg)  ?06/16/21 294 lb 6.4 oz (133.5 kg)  ?  ?BP Readings from Last 3 Encounters:  ?10/06/21 118/80  ?08/12/21 119/69  ?07/01/21 118/80  ? ?General: Chronically ill but generally well-appearing male in no distress ?Cardiac: Heart regular rate and rhythm, no lower extremity edema ?Pulm: Breathing comfortably on room air, lung sounds are clear ?Psych: Normal mood and affect ? ?Assessment & Plan:  ? ? ? Essential hypertension  ?  Current medications: Amlodipine 10 mg daily, metoprolol succinate 25 mg daily, valsartan 80 mg daily ?Blood pressure is at goal in the office today. ?- Continue current management ? ?  ?  ? Relevant Medications  ? amLODipine (NORVASC) 10 MG tablet  ? furosemide (LASIX) 40 MG tablet  ? metoprolol succinate (TOPROL XL) 25 MG 24 hr tablet  ? valsartan (DIOVAN) 80 MG tablet  ? Other Relevant Orders  ? Basic metabolic panel  ? Type II diabetes mellitus ([place]) - Primary  ?  Current medications: metformin 537m  twice daily, Trulicity 00.99mg weekly, gabapentin 300 mg 3 times daily ?Assessment: Diabetes is well controlled. A1C is 5.5.  Denies adverse medication effects. ?Plan ?Continue current management ?Diabetic foot exam performed in the office today ?He is aware of importance of yearly retinopathy screening ?Follow-up in 3 months ?  ?  ? Morbid obesity ([place] (Chronic)  ?  Weight is down another 6 pounds from January-- 294>> 288 pounds.  He reports needing to lose another 13 pounds in order to be candidate for total hip replacement. ?- He is slowly losing weight with Trulicity.  I will check with CRosendo Grosto see if there would be any barriers to [place] ?- Note, he does not tolerate Trulicity doses over 08.33mg-- previously admitted for AKI secondary to GI adverse effects. ? ?  ?  ? Severe depression (HCC) (Chronic)  ?  Current medications: Cymbalta 60 mg daily.  BuSpar and Remeron are on his medicine list as well however dispense report shows he has not picked these up in a few months. ?PHQ-9 is 18 today, increased from 14 in January.  His mood is incongruent with this increase.  He actually feels like things are going pretty well.  He recently was able to move out of the homeless shelter and is  now residing in a boardinghouse.  Additionally, he won his SSI case and will start receiving payments through that starting in June.  This is a great relief to him as this was causing him a lot of anxiety in the past.  Additionally, he has a new job which she is excited about.  No SI/HI today. ? ?  ?  ? Relevant Medications  ? DULoxetine (CYMBALTA) 60 MG capsule  ? Hip pain 2/2 osteoarthritis  ?  Reports ongoing hip pain, left worse than right.  Supposedly needs to lose another 13 pounds in order to qualify for total hip replacement.  He has been taking Tylenol.  Due to his kidney disease, he knows to avoid NSAIDs.  He will follow-up with orthopedics to undergo another steroid injection. ? ?  ?  ? 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 Continue to monitor.  ? ? ?Follow-ups ? ?Follow-up with Dr. Radford Pax in cardiology 11/03/2021 ?Return in about 3 months (around 01/06/2022) for Diabetes follow up. ? ? ?Pt discussed with Dr. Dareen Piano ? ?[name], MD ?Internal Medicine Resident PGY-3 ?[facility] ?10/06/2021 4:49 PM  ?  ?

## 2021-10-06 NOTE — Assessment & Plan Note (Signed)
Current medications: Amlodipine 10 mg daily, metoprolol succinate 25 mg daily, valsartan 80 mg daily ?Blood pressure is at goal in the office today. ?- Continue current management ?

## 2021-10-06 NOTE — Assessment & Plan Note (Signed)
Weight is down another 6 pounds from January-- 294>> 288 pounds.  He reports needing to lose another 13 pounds in order to be candidate for total hip replacement. ?- He is slowly losing weight with Trulicity.  I will check with Rosendo Gros to see if there would be any barriers to transitioning him to [place]. ?- Note, he does not tolerate Trulicity doses over 7.25 mg-- previously admitted for AKI secondary to GI adverse effects. ?

## 2021-10-06 NOTE — Patient Instructions (Addendum)
It was very nice to see you in clinic today. Here are a few reminders from what we discussed today: ? ?Your A1c was 5.5 today, which is great! We will continue you on your current medications. ?For your hip pain, we recommend you contact [facility]. You should be able to get another hip injection, and we know these have helped in the past. ?Keep trying to lose weight! You only have 13 more pounds to go to reach 275lbs! You can do it! ?We ordered a BMP to check your electrolytes and kidney function, and we will contact you with the results. ? ?We would like for you to follow-up in clinic again in 3 months! If you have any questions or concerns before your next appointment, please do not hesitate to contact us! ?

## 2021-10-07 ENCOUNTER — Ambulatory Visit: Payer: Medicaid Other | Admitting: Behavioral Health

## 2021-10-07 ENCOUNTER — Other Ambulatory Visit (HOSPITAL_COMMUNITY): Payer: Self-pay

## 2021-10-07 ENCOUNTER — Telehealth: Payer: Self-pay | Admitting: Behavioral Health

## 2021-10-07 DIAGNOSIS — N1831 Chronic kidney disease, stage 3a: Secondary | ICD-10-CM | POA: Insufficient documentation

## 2021-10-07 LAB — BASIC METABOLIC PANEL
BUN/Creatinine Ratio: 24 — ABNORMAL HIGH (ref 9–20)
BUN: 43 mg/dL — ABNORMAL HIGH (ref 6–24)
CO2: 20 mmol/L (ref 20–29)
Calcium: 9.8 mg/dL (ref 8.7–10.2)
Chloride: 104 mmol/L (ref 96–106)
Creatinine, Ser: 1.76 mg/dL — ABNORMAL HIGH (ref 0.76–1.27)
Glucose: 110 mg/dL — ABNORMAL HIGH (ref 70–99)
Potassium: 4.8 mmol/L (ref 3.5–5.2)
Sodium: 144 mmol/L (ref 134–144)
eGFR: 45 mL/min/{1.73_m2} — ABNORMAL LOW (ref >59)

## 2021-10-07 NOTE — Assessment & Plan Note (Signed)
Chronic and stable on labs today. Continue to monitor.  ?

## 2021-10-07 NOTE — Progress Notes (Signed)
Your renal function and electrolytes are stable on your labs from your visit. Please let me know if you have any questions or concerns.  ? ?[name], MD

## 2021-10-07 NOTE — Progress Notes (Signed)
Internal Medicine Clinic Attending  Case discussed with Dr. [name]  At the time of the visit.  We reviewed the resident's history and exam and pertinent patient test results.  I agree with the assessment, diagnosis, and plan of care documented in the resident's note.  

## 2021-10-07 NOTE — Telephone Encounter (Signed)
Unable to lv msg for Pt as phone states, "Your call cannot be completed @ this time." Pt can RC to [facility] & r/s @ his convenience. ? ?Dr. Theodis Shove ?

## 2021-10-12 ENCOUNTER — Other Ambulatory Visit (HOSPITAL_COMMUNITY): Payer: Self-pay

## 2021-11-03 ENCOUNTER — Ambulatory Visit: Payer: Medicaid Other | Admitting: Cardiology

## 2021-11-09 ENCOUNTER — Emergency Department (HOSPITAL_COMMUNITY): Payer: Medicare Other

## 2021-11-09 ENCOUNTER — Emergency Department (HOSPITAL_COMMUNITY)
Admission: EM | Admit: 2021-11-09 | Discharge: 2021-11-09 | Disposition: A | Discharge status: Home / Self Care | Payer: Medicare Other | Attending: Emergency Medicine | Admitting: Emergency Medicine

## 2021-11-09 ENCOUNTER — Emergency Department (HOSPITAL_BASED_OUTPATIENT_CLINIC_OR_DEPARTMENT_OTHER)
Admit: 2021-11-09 | Discharge: 2021-11-09 | Disposition: A | Discharge status: Home / Self Care | Payer: Medicare Other | Attending: Emergency Medicine | Admitting: Emergency Medicine

## 2021-11-09 ENCOUNTER — Encounter (HOSPITAL_COMMUNITY): Payer: Self-pay

## 2021-11-09 ENCOUNTER — Other Ambulatory Visit: Payer: Self-pay

## 2021-11-09 DIAGNOSIS — R609 Edema, unspecified: Secondary | ICD-10-CM | POA: Diagnosis not present

## 2021-11-09 DIAGNOSIS — E875 Hyperkalemia: Secondary | ICD-10-CM | POA: Insufficient documentation

## 2021-11-09 DIAGNOSIS — K5900 Constipation, unspecified: Secondary | ICD-10-CM | POA: Insufficient documentation

## 2021-11-09 LAB — CBC
HCT: 37.9 % — ABNORMAL LOW (ref 39.0–52.0)
Hemoglobin: 12 g/dL — ABNORMAL LOW (ref 13.0–17.0)
MCH: 34.4 pg — ABNORMAL HIGH (ref 26.0–34.0)
MCHC: 31.7 g/dL (ref 30.0–36.0)
MCV: 108.6 fL — ABNORMAL HIGH (ref 80.0–100.0)
Platelets: 227 10*3/uL (ref 150–400)
RBC: 3.49 MIL/uL — ABNORMAL LOW (ref 4.22–5.81)
RDW: 14.1 % (ref 11.5–15.5)
WBC: 11.1 10*3/uL — ABNORMAL HIGH (ref 4.0–10.5)
nRBC: 0 % (ref 0.0–0.2)

## 2021-11-09 LAB — COMPREHENSIVE METABOLIC PANEL
ALT: 24 U/L (ref 0–44)
AST: 34 U/L (ref 15–41)
Albumin: 3.4 g/dL — ABNORMAL LOW (ref 3.5–5.0)
Alkaline Phosphatase: 94 U/L (ref 38–126)
Anion gap: 9 (ref 5–15)
BUN: 29 mg/dL — ABNORMAL HIGH (ref 6–20)
CO2: 22 mmol/L (ref 22–32)
Calcium: 9 mg/dL (ref 8.9–10.3)
Chloride: 106 mmol/L (ref 98–111)
Creatinine, Ser: 1.32 mg/dL — ABNORMAL HIGH (ref 0.61–1.24)
GFR, Estimated: >60 mL/min (ref >60)
Glucose, Bld: 237 mg/dL — ABNORMAL HIGH (ref 70–99)
Potassium: 6 mmol/L — ABNORMAL HIGH (ref 3.5–5.1)
Sodium: 137 mmol/L (ref 135–145)
Total Bilirubin: 1.6 mg/dL — ABNORMAL HIGH (ref 0.3–1.2)
Total Protein: 6.6 g/dL (ref 6.5–8.1)

## 2021-11-09 LAB — URINALYSIS, ROUTINE W REFLEX MICROSCOPIC
Bilirubin Urine: NEGATIVE
Glucose, UA: NEGATIVE mg/dL
Hgb urine dipstick: NEGATIVE
Ketones, ur: NEGATIVE mg/dL
Leukocytes,Ua: NEGATIVE
Nitrite: NEGATIVE
Protein, ur: NEGATIVE mg/dL
Specific Gravity, Urine: 1.015 (ref 1.005–1.030)
pH: 6 (ref 5.0–8.0)

## 2021-11-09 LAB — POTASSIUM: Potassium: 4.3 mmol/L (ref 3.5–5.1)

## 2021-11-09 LAB — LIPASE, BLOOD: Lipase: 33 U/L (ref 11–51)

## 2021-11-09 MED ORDER — FLEET ENEMA 7-19 GM/118ML RE ENEM
1.0000 | ENEMA | Freq: Once | RECTAL | Status: AC
  Administered 2021-11-09: 1 via RECTAL
  Filled 2021-11-09: qty 1

## 2021-11-09 MED ORDER — SODIUM ZIRCONIUM CYCLOSILICATE 10 G PO PACK
10.0000 g | PACK | Freq: Once | ORAL | Status: AC
  Administered 2021-11-09: 10 g via ORAL
  Filled 2021-11-09: qty 1

## 2021-11-09 NOTE — ED Triage Notes (Addendum)
Reports leg swelling x 3=4days ago.  Also reports constipation x 8 days.  Reports has not taken anything for constipation.  Patient upset bc he can't get in and out of apartment bc it is on 2nd floor.  COmplains he can't get around to get on the bus and has no  money to get anything he needs.  Reports he has not been eating due to no money.  Reports he paid 3k for  bills and rent and was evicted the same day.

## 2021-11-09 NOTE — ED Notes (Signed)
Patient able to find transportation home.

## 2021-11-09 NOTE — Progress Notes (Signed)
Right lower extremity venous duplex completed. Refer to "CV Proc" under chart review to view preliminary results.  11/09/2021 12:34 PM Kelby Aline., MHA, RVT, RDCS, RDMS

## 2021-11-09 NOTE — ED Notes (Signed)
Patient provided with sandwich and juice per request

## 2021-11-09 NOTE — Discharge Instructions (Addendum)
Your repeat potassium appears improved to 4.3.  Make sure you follow-up with your doctor to repeat this lab test again in a week to ensure your potassium level remains normal.  Take MiraLAX daily to avoid constipation.  Return to the ER if you have worsening symptoms or any additional concerns.

## 2021-11-09 NOTE — ED Provider Triage Note (Signed)
Emergency Medicine Provider Triage Evaluation Note  Allen Gould , a 56 y.o. male  was evaluated in triage.  Pt complains of bilateral leg pain with swelling of right greater than left for around 3 to 4 days.  Patient reports he has been on his feet a lot.  History of diabetes, neuropathy.  Patient also endorses some constipation for the last 7 to 8 days with minimal abdominal pain.  He has not tried anything for the constipation reports that he is having some financial difficulties at this time.  He reports a mild cough but denies any chest pain, shortness of breath.  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  Review of Systems  Positive: Leg swelling, constipation Negative: Chest pain, shob  Physical Exam  BP (!) 137/91 (BP Location: Left Arm)   Pulse 96   Temp 98 F (36.7 C) (Oral)   Resp 17   Ht 5' 10"  (1.778 m)   Wt 130.6 kg   SpO2 98%   BMI 41.32 kg/m  Gen:   Awake, no distress   Resp:  Normal effort  MSK:   Moves extremities without difficulty  Other:  1-2+ non-pitting edema bilaterally, worst on dorsum of foot, right foot slightly more swollen than left on my exam. No significant abd distension or TTP of abdome  Medical Decision Making  Medically screening exam initiated at 11:05 AM.  Appropriate orders placed.  Allen Gould was informed that the remainder of the evaluation will be completed by another provider, this initial triage assessment does not replace that evaluation, and the importance of remaining in the ED until their evaluation is complete.  Workup initiated   Anselmo Pickler, [place] 11/09/21 1107

## 2021-11-09 NOTE — ED Notes (Signed)
Pt ambulatory to bathroom. Pt had a small BM

## 2021-11-09 NOTE — ED Provider Notes (Signed)
[facility] EMERGENCY DEPARTMENT Provider Note   CSN: [id number] Arrival date & time: 11/09/21  1029     History  Chief Complaint  Patient presents with   Constipation   Leg Swelling    Allen Gould is a 56 y.o. male.  Patient presents ER chief complaint of abdominal bloating and constipation.  He states he feels impacted and has been feeling this way for about 7 or 8 days.  Denies any vomiting.  Denies any fevers or cough.       Home Medications Prior to Admission medications   Medication Sig Start Date End Date Taking? Authorizing Provider  Accu-Chek Softclix Lancets lancets Check blood sugar 3 times a day 06/11/20   [name], MD  amLODipine (NORVASC) 10 MG tablet Take 1 tablet (10 mg total) by mouth daily. 10/06/21 10/01/22  [name], MD  apixaban (ELIQUIS) 5 MG TABS tablet Take 1 tablet (5 mg total) by mouth 2 (two) times daily. 01/30/21   [name], MD  atorvastatin (LIPITOR) 80 MG tablet TAKE 1 TABLET (80 MG TOTAL) BY MOUTH DAILY. 07/01/21   [name], MD  DULoxetine (CYMBALTA) 60 MG capsule Take 1 capsule (60 mg total) by mouth daily. 10/06/21 10/06/22  [name], MD  furosemide (LASIX) 40 MG tablet Take 1 tablet (40 mg total) by mouth every other day. 10/06/21   [name], MD  gabapentin (NEURONTIN) 300 MG capsule TAKE 1 CAPSULE (300 MG TOTAL) BY MOUTH IN THE MORNING, AT NOON, AND AT BEDTIME. 09/03/21 03/02/22  [name], [name], MD  glucose blood (ACCU-CHEK GUIDE) test strip Check blood sugar 3 times a day 06/11/20   [name], MD  metFORMIN (GLUCOPHAGE) 500 MG tablet Take 1 tablet (500 mg total) by mouth 2 (two) times daily with a meal. 10/06/21   [name], [name], MD  metoprolol succinate (TOPROL XL) 25 MG 24 hr tablet Take 1 tablet (25 mg total) by mouth at bedtime. 10/06/21   [name], [name], MD  pantoprazole (PROTONIX) 40 MG tablet TAKE 1 TABLET (40 MG TOTAL) BY MOUTH DAILY. 09/03/21 09/03/22  [name], MD   TRULICITY 6.44 IH/4.7QQ SOPN INJECT 0.75 MG INTO THE SKIN ONCE A WEEK. 10/06/21   [name], MD  valsartan (DIOVAN) 80 MG tablet Take 1 tablet (80 mg total) by mouth daily. 10/06/21   [name], MD  buPROPion (WELLBUTRIN XL) 150 MG 24 hr tablet Take 1 tablet (150 mg total) by mouth every morning. 06/04/21 06/13/21  [name], MD      Allergies    Shellfish allergy    Review of Systems   Review of Systems  Constitutional:  Negative for fever.  HENT:  Negative for ear pain and sore throat.   Eyes:  Negative for pain.  Respiratory:  Negative for cough.   Cardiovascular:  Negative for chest pain.  Gastrointestinal:  Positive for rectal pain. Negative for abdominal distention.  Genitourinary:  Negative for flank pain.  Musculoskeletal:  Negative for back pain.  Skin:  Negative for color change and rash.  Neurological:  Negative for syncope.  All other systems reviewed and are negative.   Physical Exam Updated Vital Signs BP (!) 137/104 (BP Location: Right Arm)   Pulse 77   Temp 98.3 F (36.8 C)   Resp 16   Ht 5' 10"  (1.778 m)   Wt 130.6 kg   SpO2 98%   BMI 41.32 kg/m  Physical Exam Constitutional:      Appearance: He is well-developed.  HENT:     Head: Normocephalic.     Nose: Nose normal.  Eyes:     Extraocular Movements: Extraocular movements intact.  Cardiovascular:     Rate and Rhythm: Normal rate.  Pulmonary:     Effort: Pulmonary effort is normal.  Abdominal:     Comments: Diffuse abdominal discomfort on palpation.  No localized tenderness noted.  Skin:    Coloration: Skin is not jaundiced.  Neurological:     Mental Status: He is alert. Mental status is at baseline.     ED Results / Procedures / Treatments   Labs (all labs ordered are listed, but only abnormal results are displayed) Labs Reviewed  COMPREHENSIVE METABOLIC PANEL - Abnormal; Notable for the following components:      Result Value   Potassium 6.0 (*)    Glucose, Bld 237  (*)    BUN 29 (*)    Creatinine, Ser 1.32 (*)    Albumin 3.4 (*)    Total Bilirubin 1.6 (*)    All other components within normal limits  CBC - Abnormal; Notable for the following components:   WBC 11.1 (*)    RBC 3.49 (*)    Hemoglobin 12.0 (*)    HCT 37.9 (*)    MCV 108.6 (*)    MCH 34.4 (*)    All other components within normal limits  LIPASE, BLOOD  URINALYSIS, ROUTINE W REFLEX MICROSCOPIC  POTASSIUM    EKG None  Radiology VAS [place] LOWER EXTREMITY VENOUS (DVT) (7a-7p)  Result Date: 11/09/2021  Lower Venous DVT Study Patient Name:  Allen Gould  Date of Exam:   11/09/2021 Medical Rec #: [id number]        Accession #:    [id number] Date of Birth: [birth year]        Patient Gender: M Patient Age:   33 years Exam Location:  [facility] Procedure:      VAS [place] LOWER EXTREMITY VENOUS (DVT) Referring Phys: [name] PROSPERI --------------------------------------------------------------------------------  Indications: Edema.  Limitations: Body habitus, poor ultrasound/tissue interface and position. Comparison Study: No prior study Performing Technologist: Maudry Mayhew MHA, RDMS, RVT, RDCS  Examination Guidelines: A complete evaluation includes B-mode imaging, spectral Doppler, color Doppler, and power Doppler as needed of all accessible portions of each vessel. Bilateral testing is considered an integral part of a complete examination. Limited examinations for reoccurring indications may be performed as noted. The reflux portion of the exam is performed with the patient in reverse Trendelenburg.  +---------+---------------+---------+-----------+----------+--------------+ RIGHT    CompressibilityPhasicitySpontaneityPropertiesThrombus Aging +---------+---------------+---------+-----------+----------+--------------+ CFV      Full           Yes      Yes                                 +---------+---------------+---------+-----------+----------+--------------+ SFJ      Full                                                         +---------+---------------+---------+-----------+----------+--------------+ FV Prox  Full                                                        +---------+---------------+---------+-----------+----------+--------------+  FV Mid   Full                                                        +---------+---------------+---------+-----------+----------+--------------+ FV DistalFull                                                        +---------+---------------+---------+-----------+----------+--------------+ PFV      Full                                                        +---------+---------------+---------+-----------+----------+--------------+ POP      Full           Yes      Yes                                 +---------+---------------+---------+-----------+----------+--------------+ PTV      Full                                                        +---------+---------------+---------+-----------+----------+--------------+   Right Technical Findings: Not visualized segments include peroneal veins.  Left Technical Findings: Not visualized segments include CFV.   Summary: RIGHT: - There is no evidence of deep vein thrombosis in the lower extremity. However, portions of this examination were limited- see technologist comments above.  - No cystic structure found in the popliteal fossa.   *See table(s) above for measurements and observations. Electronically signed by Monica Martinez MD on 11/09/2021 at 7:49:15 PM.    Final    DG Abdomen 1 View  Result Date: 11/09/2021 CLINICAL DATA:  Constipation, abdominal pain EXAM: ABDOMEN - 1 VIEW COMPARISON:  None Available. FINDINGS: Nonobstructive pattern of bowel gas, with gas present to the rectum. Large burden of stool throughout the colon. No free air on supine radiographs. No radio-opaque calculi or other significant radiographic abnormality are seen.  IMPRESSION: Nonobstructive pattern of bowel gas, with gas present to the rectum. Large burden of stool throughout the colon. Electronically Signed   By: Delanna Ahmadi M.D.   On: 11/09/2021 11:48    Procedures Procedures    Medications Ordered in ED Medications  sodium phosphate (FLEET) 7-19 GM/118ML enema 1 enema (1 enema Rectal Given 11/09/21 1818)  sodium zirconium cyclosilicate (LOKELMA) packet 10 g (10 g Oral Given 11/09/21 1817)    ED Course/ Medical Decision Making/ A&P                           Medical Decision Making Amount and/or Complexity of Data Reviewed Labs: ordered.  Risk OTC drugs. Prescription drug management.   Review of record shows office visit Oct 06, 2021.  Cardiac monitoring shows paced rhythm.  Diagnostic studies includes labs CBC chemistry.  Hemoglobin  normal potassium elevated 6.0.  EKG shows paced pattern.  Patient given Lokelma and rectal enema with subsequent large bowel movement.  Subsequent abdominal exam is benign with no guarding or tenderness noted.  Repeat potassium appears improved at 4.3.  Patient discharged home in stable condition.        Final Clinical Impression(s) / ED Diagnoses Final diagnoses:  Constipation, unspecified constipation type  Hyperkalemia    Rx / DC Orders ED Discharge Orders     None         Luna Fuse, MD 11/09/21 2227

## 2021-11-11 ENCOUNTER — Telehealth: Payer: Self-pay | Admitting: *Deleted

## 2021-11-11 NOTE — Telephone Encounter (Signed)
Patient called in requesting call back from [facility]. States it's been awhile since they've spoken. When asked what this is regarding, he stated, "my sanity." States call does not need to be today.

## 2021-11-16 ENCOUNTER — Encounter: Payer: Self-pay | Admitting: *Deleted

## 2021-11-17 ENCOUNTER — Telehealth: Payer: Self-pay | Admitting: Behavioral Health

## 2021-11-17 ENCOUNTER — Ambulatory Visit: Payer: Medicare Other | Admitting: Behavioral Health
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
## 2021-11-18 ENCOUNTER — Encounter: Payer: Medicare Other | Admitting: Student

## 2021-12-16 ENCOUNTER — Encounter: Payer: Self-pay | Admitting: Podiatry

## 2022-01-11 ENCOUNTER — Ambulatory Visit: Payer: Medicaid Other | Admitting: Podiatry

## 2022-01-18 ENCOUNTER — Telehealth: Payer: Self-pay | Admitting: Physical Medicine and Rehabilitation

## 2022-01-18 NOTE — Telephone Encounter (Signed)
Patient called needing to schedule an appointment with Dr. Ernestina Patches for an injection in his hips.  The number to contact patient is [phone]

## 2022-01-27 ENCOUNTER — Encounter (HOSPITAL_COMMUNITY): Payer: Self-pay | Admitting: Emergency Medicine

## 2022-01-27 ENCOUNTER — Other Ambulatory Visit: Payer: Self-pay

## 2022-01-27 ENCOUNTER — Observation Stay (HOSPITAL_COMMUNITY)
Admission: EM | Admit: 2022-01-27 | Discharge: 2022-01-30 | Disposition: A | Discharge status: Skilled Nursing Facility | Payer: Medicare Other | Attending: Internal Medicine | Admitting: Internal Medicine

## 2022-01-27 DIAGNOSIS — I495 Sick sinus syndrome: Secondary | ICD-10-CM | POA: Diagnosis not present

## 2022-01-27 DIAGNOSIS — I6381 Other cerebral infarction due to occlusion or stenosis of small artery: Secondary | ICD-10-CM | POA: Diagnosis not present

## 2022-01-27 DIAGNOSIS — E119 Type 2 diabetes mellitus without complications: Secondary | ICD-10-CM

## 2022-01-27 DIAGNOSIS — Z79899 Other long term (current) drug therapy: Secondary | ICD-10-CM | POA: Diagnosis not present

## 2022-01-27 DIAGNOSIS — N3000 Acute cystitis without hematuria: Secondary | ICD-10-CM | POA: Insufficient documentation

## 2022-01-27 DIAGNOSIS — H209 Unspecified iridocyclitis: Secondary | ICD-10-CM | POA: Diagnosis not present

## 2022-01-27 DIAGNOSIS — R5381 Other malaise: Secondary | ICD-10-CM | POA: Insufficient documentation

## 2022-01-27 DIAGNOSIS — E860 Dehydration: Secondary | ICD-10-CM | POA: Insufficient documentation

## 2022-01-27 DIAGNOSIS — S098XXA Other specified injuries of head, initial encounter: Secondary | ICD-10-CM | POA: Insufficient documentation

## 2022-01-27 DIAGNOSIS — Z7984 Long term (current) use of oral hypoglycemic drugs: Secondary | ICD-10-CM | POA: Insufficient documentation

## 2022-01-27 DIAGNOSIS — R262 Difficulty in walking, not elsewhere classified: Secondary | ICD-10-CM | POA: Insufficient documentation

## 2022-01-27 DIAGNOSIS — E1122 Type 2 diabetes mellitus with diabetic chronic kidney disease: Secondary | ICD-10-CM | POA: Diagnosis not present

## 2022-01-27 DIAGNOSIS — D72829 Elevated white blood cell count, unspecified: Secondary | ICD-10-CM | POA: Diagnosis not present

## 2022-01-27 DIAGNOSIS — I5030 Unspecified diastolic (congestive) heart failure: Secondary | ICD-10-CM | POA: Insufficient documentation

## 2022-01-27 DIAGNOSIS — Z7901 Long term (current) use of anticoagulants: Secondary | ICD-10-CM | POA: Diagnosis not present

## 2022-01-27 DIAGNOSIS — N1831 Chronic kidney disease, stage 3a: Secondary | ICD-10-CM | POA: Diagnosis not present

## 2022-01-27 DIAGNOSIS — Z950 Presence of cardiac pacemaker: Secondary | ICD-10-CM | POA: Diagnosis not present

## 2022-01-27 DIAGNOSIS — F1410 Cocaine abuse, uncomplicated: Secondary | ICD-10-CM

## 2022-01-27 DIAGNOSIS — R519 Headache, unspecified: Secondary | ICD-10-CM | POA: Diagnosis not present

## 2022-01-27 DIAGNOSIS — R6889 Other general symptoms and signs: Secondary | ICD-10-CM | POA: Diagnosis not present

## 2022-01-27 DIAGNOSIS — F1490 Cocaine use, unspecified, uncomplicated: Secondary | ICD-10-CM | POA: Diagnosis not present

## 2022-01-27 DIAGNOSIS — N179 Acute kidney failure, unspecified: Principal | ICD-10-CM | POA: Insufficient documentation

## 2022-01-27 DIAGNOSIS — Z7985 Long-term (current) use of injectable non-insulin antidiabetic drugs: Secondary | ICD-10-CM | POA: Diagnosis not present

## 2022-01-27 DIAGNOSIS — I480 Paroxysmal atrial fibrillation: Secondary | ICD-10-CM | POA: Insufficient documentation

## 2022-01-27 DIAGNOSIS — M79673 Pain in unspecified foot: Secondary | ICD-10-CM | POA: Diagnosis not present

## 2022-01-27 DIAGNOSIS — R2681 Unsteadiness on feet: Secondary | ICD-10-CM | POA: Diagnosis not present

## 2022-01-27 DIAGNOSIS — I10 Essential (primary) hypertension: Secondary | ICD-10-CM | POA: Diagnosis present

## 2022-01-27 DIAGNOSIS — Z743 Need for continuous supervision: Secondary | ICD-10-CM | POA: Diagnosis not present

## 2022-01-27 DIAGNOSIS — M6281 Muscle weakness (generalized): Secondary | ICD-10-CM | POA: Diagnosis not present

## 2022-01-27 DIAGNOSIS — M25559 Pain in unspecified hip: Secondary | ICD-10-CM | POA: Diagnosis present

## 2022-01-27 DIAGNOSIS — I130 Hypertensive heart and chronic kidney disease with heart failure and stage 1 through stage 4 chronic kidney disease, or unspecified chronic kidney disease: Secondary | ICD-10-CM | POA: Insufficient documentation

## 2022-01-27 DIAGNOSIS — H579 Unspecified disorder of eye and adnexa: Secondary | ICD-10-CM | POA: Diagnosis not present

## 2022-01-27 DIAGNOSIS — R531 Weakness: Secondary | ICD-10-CM | POA: Diagnosis not present

## 2022-01-27 DIAGNOSIS — S0993XA Unspecified injury of face, initial encounter: Secondary | ICD-10-CM | POA: Diagnosis not present

## 2022-01-27 DIAGNOSIS — H538 Other visual disturbances: Secondary | ICD-10-CM | POA: Insufficient documentation

## 2022-01-27 DIAGNOSIS — H5712 Ocular pain, left eye: Secondary | ICD-10-CM | POA: Diagnosis present
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
## 2022-01-28 ENCOUNTER — Observation Stay (HOSPITAL_COMMUNITY): Payer: Medicare Other

## 2022-01-28 ENCOUNTER — Emergency Department (HOSPITAL_COMMUNITY): Payer: Medicare Other

## 2022-01-28 DIAGNOSIS — M160 Bilateral primary osteoarthritis of hip: Secondary | ICD-10-CM | POA: Diagnosis not present

## 2022-01-28 DIAGNOSIS — N3000 Acute cystitis without hematuria: Secondary | ICD-10-CM | POA: Diagnosis not present

## 2022-01-28 DIAGNOSIS — N179 Acute kidney failure, unspecified: Secondary | ICD-10-CM | POA: Diagnosis not present

## 2022-01-28 DIAGNOSIS — H5712 Ocular pain, left eye: Secondary | ICD-10-CM | POA: Diagnosis not present

## 2022-01-28 DIAGNOSIS — N1831 Chronic kidney disease, stage 3a: Secondary | ICD-10-CM | POA: Diagnosis not present

## 2022-01-28 DIAGNOSIS — R519 Headache, unspecified: Secondary | ICD-10-CM | POA: Diagnosis not present

## 2022-01-28 DIAGNOSIS — I129 Hypertensive chronic kidney disease with stage 1 through stage 4 chronic kidney disease, or unspecified chronic kidney disease: Secondary | ICD-10-CM

## 2022-01-28 DIAGNOSIS — I6381 Other cerebral infarction due to occlusion or stenosis of small artery: Secondary | ICD-10-CM | POA: Diagnosis not present

## 2022-01-28 DIAGNOSIS — Z6839 Body mass index (BMI) 39.0-39.9, adult: Secondary | ICD-10-CM

## 2022-01-28 DIAGNOSIS — S0993XA Unspecified injury of face, initial encounter: Secondary | ICD-10-CM | POA: Diagnosis not present

## 2022-01-28 DIAGNOSIS — R531 Weakness: Secondary | ICD-10-CM | POA: Diagnosis not present

## 2022-01-28 LAB — BASIC METABOLIC PANEL
Anion gap: 12 (ref 5–15)
BUN: 39 mg/dL — ABNORMAL HIGH (ref 6–20)
CO2: 22 mmol/L (ref 22–32)
Calcium: 9.7 mg/dL (ref 8.9–10.3)
Chloride: 105 mmol/L (ref 98–111)
Creatinine, Ser: 1.74 mg/dL — ABNORMAL HIGH (ref 0.61–1.24)
GFR, Estimated: 45 mL/min — ABNORMAL LOW (ref >60)
Glucose, Bld: 119 mg/dL — ABNORMAL HIGH (ref 70–99)
Potassium: 3.5 mmol/L (ref 3.5–5.1)
Sodium: 139 mmol/L (ref 135–145)

## 2022-01-28 LAB — URINALYSIS, ROUTINE W REFLEX MICROSCOPIC
Bilirubin Urine: NEGATIVE
Glucose, UA: NEGATIVE mg/dL
Hgb urine dipstick: NEGATIVE
Ketones, ur: 5 mg/dL — AB
Nitrite: NEGATIVE
Protein, ur: 30 mg/dL — AB
Specific Gravity, Urine: 1.019 (ref 1.005–1.030)
pH: 5 (ref 5.0–8.0)

## 2022-01-28 LAB — RAPID URINE DRUG SCREEN, HOSP PERFORMED
Amphetamines: NOT DETECTED
Barbiturates: NOT DETECTED
Benzodiazepines: NOT DETECTED
Cocaine: POSITIVE — AB
Opiates: NOT DETECTED
Tetrahydrocannabinol: POSITIVE — AB

## 2022-01-28 LAB — DIFFERENTIAL
Abs Immature Granulocytes: 0 10*3/uL (ref 0.00–0.07)
Basophils Absolute: 0 10*3/uL (ref 0.0–0.1)
Basophils Relative: 0 %
Eosinophils Absolute: 0.3 10*3/uL (ref 0.0–0.5)
Eosinophils Relative: 2 %
Lymphocytes Relative: 16 %
Lymphs Abs: 2 10*3/uL (ref 0.7–4.0)
Monocytes Absolute: 1 10*3/uL (ref 0.1–1.0)
Monocytes Relative: 8 %
Neutro Abs: 9.3 10*3/uL — ABNORMAL HIGH (ref 1.7–7.7)
Neutrophils Relative %: 74 %

## 2022-01-28 LAB — CBC
HCT: 39.9 % (ref 39.0–52.0)
Hemoglobin: 13.3 g/dL (ref 13.0–17.0)
MCH: 33.2 pg (ref 26.0–34.0)
MCHC: 33.3 g/dL (ref 30.0–36.0)
MCV: 99.5 fL (ref 80.0–100.0)
Platelets: 230 10*3/uL (ref 150–400)
RBC: 4.01 MIL/uL — ABNORMAL LOW (ref 4.22–5.81)
RDW: 12.4 % (ref 11.5–15.5)
WBC: 12.6 10*3/uL — ABNORMAL HIGH (ref 4.0–10.5)
nRBC: 0 % (ref 0.0–0.2)

## 2022-01-28 LAB — COMPREHENSIVE METABOLIC PANEL
ALT: 12 U/L (ref 0–44)
AST: 15 U/L (ref 15–41)
Albumin: 4 g/dL (ref 3.5–5.0)
Alkaline Phosphatase: 82 U/L (ref 38–126)
Anion gap: 11 (ref 5–15)
BUN: 41 mg/dL — ABNORMAL HIGH (ref 6–20)
CO2: 20 mmol/L — ABNORMAL LOW (ref 22–32)
Calcium: 9.7 mg/dL (ref 8.9–10.3)
Chloride: 106 mmol/L (ref 98–111)
Creatinine, Ser: 2.1 mg/dL — ABNORMAL HIGH (ref 0.61–1.24)
GFR, Estimated: 36 mL/min — ABNORMAL LOW (ref >60)
Glucose, Bld: 115 mg/dL — ABNORMAL HIGH (ref 70–99)
Potassium: 3.9 mmol/L (ref 3.5–5.1)
Sodium: 137 mmol/L (ref 135–145)
Total Bilirubin: 0.7 mg/dL (ref 0.3–1.2)
Total Protein: 7.6 g/dL (ref 6.5–8.1)

## 2022-01-28 LAB — HEMOGLOBIN A1C
Hgb A1c MFr Bld: 6.3 % — ABNORMAL HIGH (ref 4.8–5.6)
Mean Plasma Glucose: 134.11 mg/dL

## 2022-01-28 LAB — CBG MONITORING, ED
Glucose-Capillary: 116 mg/dL — ABNORMAL HIGH (ref 70–99)
Glucose-Capillary: 106 mg/dL — ABNORMAL HIGH (ref 70–99)
Glucose-Capillary: 224 mg/dL — ABNORMAL HIGH (ref 70–99)

## 2022-01-28 LAB — PROTIME-INR
INR: 1 (ref 0.8–1.2)
Prothrombin Time: 13.3 seconds (ref 11.4–15.2)

## 2022-01-28 LAB — GLUCOSE, CAPILLARY: Glucose-Capillary: 190 mg/dL — ABNORMAL HIGH (ref 70–99)

## 2022-01-28 LAB — APTT: aPTT: 33 seconds (ref 24–36)

## 2022-01-28 LAB — ETHANOL: Alcohol, Ethyl (B): <10 mg/dL (ref <10)

## 2022-01-28 MED ORDER — AMLODIPINE BESYLATE 10 MG PO TABS
10.0000 mg | ORAL_TABLET | Freq: Every day | ORAL | Status: DC
  Administered 2022-01-28 – 2022-01-30 (×3): 10 mg via ORAL
  Filled 2022-01-28: qty 1
  Filled 2022-01-28: qty 2
  Filled 2022-01-28: qty 1

## 2022-01-28 MED ORDER — INSULIN ASPART 100 UNIT/ML IJ SOLN
0.0000 [IU] | Freq: Three times a day (TID) | INTRAMUSCULAR | Status: DC
  Administered 2022-01-29 (×2): 2 [IU] via SUBCUTANEOUS
  Administered 2022-01-29: 3 [IU] via SUBCUTANEOUS
  Administered 2022-01-30: 2 [IU] via SUBCUTANEOUS
  Administered 2022-01-30: 3 [IU] via SUBCUTANEOUS

## 2022-01-28 MED ORDER — DULOXETINE HCL 60 MG PO CPEP
60.0000 mg | ORAL_CAPSULE | Freq: Every day | ORAL | Status: DC
  Administered 2022-01-28 – 2022-01-30 (×3): 60 mg via ORAL
  Filled 2022-01-28 (×2): qty 1
  Filled 2022-01-28: qty 2

## 2022-01-28 MED ORDER — APIXABAN 5 MG PO TABS: 5.0000 mg | ORAL_TABLET | Freq: Two times a day (BID) | ORAL | Status: DC

## 2022-01-28 MED ORDER — FLUORESCEIN SODIUM 1 MG OP STRP
1.0000 | ORAL_STRIP | Freq: Once | OPHTHALMIC | Status: AC
  Administered 2022-01-28: 1 via OPHTHALMIC
  Filled 2022-01-28: qty 1

## 2022-01-28 MED ORDER — APIXABAN 2.5 MG PO TABS: 2.5000 mg | ORAL_TABLET | Freq: Two times a day (BID) | ORAL | Status: DC

## 2022-01-28 MED ORDER — GABAPENTIN 300 MG PO CAPS
300.0000 mg | ORAL_CAPSULE | Freq: Two times a day (BID) | ORAL | Status: DC
  Administered 2022-01-28 – 2022-01-30 (×5): 300 mg via ORAL
  Filled 2022-01-28 (×4): qty 1

## 2022-01-28 MED ORDER — FENTANYL CITRATE PF 50 MCG/ML IJ SOSY
50.0000 ug | PREFILLED_SYRINGE | Freq: Once | INTRAMUSCULAR | Status: AC
  Administered 2022-01-28: 50 ug via INTRAVENOUS
  Filled 2022-01-28: qty 1

## 2022-01-28 MED ORDER — PREDNISOLONE ACETATE 1 % OP SUSP
1.0000 [drp] | Freq: Three times a day (TID) | OPHTHALMIC | Status: DC
  Administered 2022-01-28 – 2022-01-30 (×9): 1 [drp] via OPHTHALMIC
  Filled 2022-01-28 (×2): qty 5

## 2022-01-28 MED ORDER — ACETAMINOPHEN 325 MG PO TABS
650.0000 mg | ORAL_TABLET | Freq: Four times a day (QID) | ORAL | Status: DC | PRN
  Administered 2022-01-29 – 2022-01-30 (×4): 650 mg via ORAL
  Filled 2022-01-28 (×4): qty 2

## 2022-01-28 MED ORDER — ATORVASTATIN CALCIUM 80 MG PO TABS
80.0000 mg | ORAL_TABLET | Freq: Every day | ORAL | Status: DC
  Administered 2022-01-28 – 2022-01-30 (×3): 80 mg via ORAL
  Filled 2022-01-28 (×2): qty 1
  Filled 2022-01-28: qty 2

## 2022-01-28 MED ORDER — METOPROLOL SUCCINATE ER 25 MG PO TB24
25.0000 mg | ORAL_TABLET | Freq: Every day | ORAL | Status: DC
  Administered 2022-01-28 – 2022-01-29 (×2): 25 mg via ORAL
  Filled 2022-01-28 (×2): qty 1

## 2022-01-28 MED ORDER — SODIUM CHLORIDE 0.9 % IV BOLUS (SEPSIS)
500.0000 mL | Freq: Once | INTRAVENOUS | Status: AC
  Administered 2022-01-28: 500 mL via INTRAVENOUS

## 2022-01-28 MED ORDER — SODIUM CHLORIDE 0.9 % IV SOLN
1.0000 g | INTRAVENOUS | Status: AC
  Administered 2022-01-29 – 2022-01-30 (×2): 1 g via INTRAVENOUS
  Filled 2022-01-28 (×2): qty 10

## 2022-01-28 MED ORDER — DICLOFENAC SODIUM 1 % EX GEL
4.0000 g | Freq: Three times a day (TID) | CUTANEOUS | Status: DC
  Administered 2022-01-28 – 2022-01-30 (×7): 4 g via TOPICAL
  Filled 2022-01-28: qty 100

## 2022-01-28 MED ORDER — POLYETHYLENE GLYCOL 3350 17 G PO PACK: 17.0000 g | PACK | Freq: Every day | ORAL | Status: DC | PRN

## 2022-01-28 MED ORDER — TETRACAINE HCL 0.5 % OP SOLN
2.0000 [drp] | Freq: Once | OPHTHALMIC | Status: AC
  Administered 2022-01-28: 2 [drp] via OPHTHALMIC
  Filled 2022-01-28: qty 4

## 2022-01-28 MED ORDER — SODIUM CHLORIDE 0.9 % IV SOLN
1.0000 g | Freq: Once | INTRAVENOUS | Status: AC
  Administered 2022-01-28: 1 g via INTRAVENOUS
  Filled 2022-01-28: qty 10

## 2022-01-28 MED ORDER — APIXABAN 5 MG PO TABS
5.0000 mg | ORAL_TABLET | Freq: Two times a day (BID) | ORAL | Status: DC
  Administered 2022-01-28 – 2022-01-30 (×5): 5 mg via ORAL
  Filled 2022-01-28 (×5): qty 1

## 2022-01-28 MED ORDER — CYCLOPENTOLATE HCL 1 % OP SOLN
1.0000 [drp] | Freq: Two times a day (BID) | OPHTHALMIC | Status: DC
  Administered 2022-01-28 – 2022-01-30 (×5): 1 [drp] via OPHTHALMIC
  Filled 2022-01-28: qty 2

## 2022-01-28 MED ORDER — ACETAMINOPHEN 650 MG RE SUPP: 650.0000 mg | Freq: Four times a day (QID) | RECTAL | Status: DC | PRN

## 2022-01-28 MED ORDER — PANTOPRAZOLE SODIUM 40 MG PO TBEC
40.0000 mg | DELAYED_RELEASE_TABLET | Freq: Every day | ORAL | Status: DC
  Administered 2022-01-28 – 2022-01-30 (×3): 40 mg via ORAL
  Filled 2022-01-28 (×3): qty 1

## 2022-01-28 NOTE — ED Notes (Signed)
Pacemaker interrogated. 

## 2022-01-28 NOTE — ED Notes (Signed)
Attempted to get patient up to walk was able to sit and stand however had great difficulty in getting his legs to move to walk. C/o pain in his hips.
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
## 2022-01-28 NOTE — Plan of Care (Signed)
  Problem: Education: Goal: Knowledge of disease or condition will improve Outcome: Progressing Goal: Understanding of medication regimen will improve Outcome: Progressing Goal: Individualized Educational Video(s) Outcome: Progressing   Problem: Activity: Goal: Ability to tolerate increased activity will improve Outcome: Progressing   Problem: Cardiac: Goal: Ability to achieve and maintain adequate cardiopulmonary perfusion will improve Outcome: Progressing   Problem: Health Behavior/Discharge Planning: Goal: Ability to safely manage health-related needs after discharge will improve Outcome: Progressing   Problem: Clinical Measurements: Goal: Respiratory complications will improve Outcome: Progressing   Problem: Coping: Goal: Level of anxiety will decrease Outcome: Progressing   Problem: Safety: Goal: Ability to remain free from injury will improve Outcome: Progressing   Problem: Skin Integrity: Goal: Risk for impaired skin integrity will decrease Outcome: Progressing
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
## 2022-01-28 NOTE — ED Notes (Signed)
Got patient off the bedpan and cleaned up sheets changed patient is resting with call bell in reach

## 2022-01-28 NOTE — TOC CAGE-AID Note (Signed)
Transition of Care [place]) - CAGE-AID Screening   Patient Details  Name: Allen Gould MRN: [id number] Date of Birth: 11-Sep-1965  Transition of Care [facility]) CM/SW Contact:    Maddie Brazier C Tarpley[place], [place] Phone Number: 01/28/2022, 5:05 PM   Clinical Narrative: Pt participated in [place].  Pt stated he does use substance.  Pt was offered resources, due to usage of substance.  Braelin Brosch Tarple[place], MSW, LCSW-A Pronouns:  She/Her/Hers [facility] HealthTransitions of Care Clinical Social Worker Direct Number:  [phone] [email or web address]     CAGE-AID Screening:    Have You Ever Felt You Ought to Cut Down on Your Drinking or Drug Use?: Yes Have People Annoyed You By [employer] Your Drinking Or Drug Use?: No Have You Felt Bad Or Guilty About Your Drinking Or Drug Use?: No Have You Ever Had a Drink or Used Drugs First Thing In The Morning to Steady Your Nerves or to Get Rid of a Hangover?: No CAGE-AID Score: 1  Substance Abuse Education Offered: Yes  Substance abuse interventions: Scientist, clinical (histocompatibility and immunogenetics)

## 2022-01-28 NOTE — Evaluation (Signed)
Physical Therapy Evaluation Patient Details Name: Allen Gould MRN: [id number] DOB: 1965/08/05 Today's Date: 01/28/2022  History of Present Illness  Pt is a 55 y/o male admitted secondary to increased LLE pain and L eye pain. Also found to have ARF. PMH includes DM, HTN, SSS s/p pacemaker, and a fib.  Clinical Impression  Pt admitted secondary to problem above with deficits below. Pt with increased pain which limited mobility tolerance. Requiring increased time and min A to transfer to/from chair for bed linen change in ED. Pt with difficulty ambulating and feel he is at increased risk for falls. Will have increased difficulty performing stair navigation to get into apartment. Recommending SNF level therapies at d/c to address mobility deficits. Will continue to follow acutely.        Recommendations for follow up therapy are one component of a multi-disciplinary discharge planning process, led by the attending physician.  Recommendations may be updated based on patient status, additional functional criteria and insurance authorization.  Follow Up Recommendations Skilled nursing-short term rehab (<3 hours/day) Can patient physically be transported by private vehicle: Yes    Assistance Recommended at Discharge Intermittent Supervision/Assistance  Patient can return home with the following  A lot of help with walking and/or transfers;A little help with bathing/dressing/bathroom;Assistance with cooking/housework;Assist for transportation;Help with stairs or ramp for entrance    Equipment Recommendations None recommended by PT  Recommendations for Other Services       Functional Status Assessment Patient has had a recent decline in their functional status and demonstrates the ability to make significant improvements in function in a reasonable and predictable amount of time.     Precautions / Restrictions Precautions Precautions: Fall Restrictions Weight Bearing Restrictions: No       Mobility  Bed Mobility Overal bed mobility: Needs Assistance Bed Mobility: Supine to Sit, Sit to Supine     Supine to sit: Min guard Sit to supine: Mod assist   General bed mobility comments: Mod A for LE assist back to stretcher    Transfers Overall transfer level: Needs assistance Equipment used: Straight cane Transfers: Sit to/from Stand, Bed to chair/wheelchair/BSC Sit to Stand: Min assist Stand pivot transfers: Min assist         General transfer comment: Increased time to come to standing. Min A for lift assist. Very slow labored steps to/from chair in ED. Flexed posture and unable to stand upright.    Ambulation/Gait                  Stairs            Wheelchair Mobility    Modified Rankin (Stroke Patients Only)       Balance Overall balance assessment: Needs assistance Sitting-balance support: No upper extremity supported, Feet supported Sitting balance-Leahy Scale: Fair     Standing balance support: Single extremity supported Standing balance-Leahy Scale: Poor Standing balance comment: Reliant on UE and external support                             Pertinent Vitals/Pain Pain Assessment Pain Assessment: Faces Faces Pain Scale: Hurts whole lot Pain Location: LLE Pain Descriptors / Indicators: Grimacing, Guarding Pain Intervention(s): Limited activity within patient's tolerance, Monitored during session, Repositioned    Home Living Family/patient expects to be discharged to:: Private residence Living Arrangements: Non-relatives/Friends Available Help at Discharge: Friend(s) Type of Home: Apartment Home Access: Stairs to enter Entrance Stairs-Rails: Right Entrance Stairs-Number of  Steps: 2 flights   Home Layout: One level Home Equipment: [place] - single point;Rollator (4 wheels)      Prior Function Prior Level of Function : Independent/Modified Independent             Mobility Comments: Uses cane vs rollator        Hand Dominance        Extremity/Trunk Assessment   Upper Extremity Assessment Upper Extremity Assessment: Defer to OT evaluation    Lower Extremity Assessment Lower Extremity Assessment: Generalized weakness;LLE deficits/detail LLE Deficits / Details: Limited weightbearing secondary to pain    Cervical / Trunk Assessment Cervical / Trunk Assessment: Kyphotic  Communication   Communication: No difficulties  Cognition Arousal/Alertness: Awake/alert Behavior During Therapy: WFL for tasks assessed/performed Overall Cognitive Status: No family/caregiver present to determine baseline cognitive functioning                                          General Comments      Exercises     Assessment/Plan    PT Assessment Patient needs continued PT services  PT Problem List Decreased strength;Decreased activity tolerance;Decreased balance;Decreased mobility;Decreased knowledge of use of DME;Decreased knowledge of precautions;Pain       PT Treatment Interventions DME instruction;Gait training;Therapeutic exercise;Stair training;Functional mobility training;Therapeutic activities;Balance training;Patient/family education    PT Goals (Current goals can be found in the Care Plan section)  Acute Rehab PT Goals Patient Stated Goal: to decrease pain PT Goal Formulation: With patient Time For Goal Achievement: 02/11/22 Potential to Achieve Goals: Good    Frequency Min 2X/week     Co-evaluation               AM-PAC PT "6 Clicks" Mobility  Outcome Measure Help needed turning from your back to your side while in a flat bed without using bedrails?: A Little Help needed moving from lying on your back to sitting on the side of a flat bed without using bedrails?: A Little Help needed moving to and from a bed to a chair (including a wheelchair)?: A Little Help needed standing up from a chair using your arms (e.g., wheelchair or bedside chair)?: A Little Help  needed to walk in hospital room?: A Lot Help needed climbing 3-5 steps with a railing? : Total 6 Click Score: 15    End of Session Equipment Utilized During Treatment: Gait belt Activity Tolerance: Patient limited by pain Patient left: in bed;with call bell/phone within reach (on stretcher in ED) Nurse Communication: Mobility status PT Visit Diagnosis: Unsteadiness on feet (R26.81);Muscle weakness (generalized) (M62.81);Difficulty in walking, not elsewhere classified (R26.2);Pain Pain - Right/Left: Left Pain - part of body: Leg;Hip    Time: 8828-0034 PT Time Calculation (min) (ACUTE ONLY): 17 min   Charges:   PT Evaluation $PT Eval Moderate Complexity: 1 Mod          Reuel Derby, PT, DPT  Acute Rehabilitation Services  Office: [phone]   Rudean Hitt 01/28/2022, 4:31 PM

## 2022-01-29 DIAGNOSIS — N179 Acute kidney failure, unspecified: Secondary | ICD-10-CM | POA: Diagnosis not present

## 2022-01-29 DIAGNOSIS — N1831 Chronic kidney disease, stage 3a: Secondary | ICD-10-CM | POA: Diagnosis not present

## 2022-01-29 DIAGNOSIS — H5712 Ocular pain, left eye: Secondary | ICD-10-CM | POA: Diagnosis not present

## 2022-01-29 DIAGNOSIS — N3000 Acute cystitis without hematuria: Secondary | ICD-10-CM | POA: Diagnosis not present

## 2022-01-29 LAB — CBC
HCT: 36.3 % — ABNORMAL LOW (ref 39.0–52.0)
Hemoglobin: 12 g/dL — ABNORMAL LOW (ref 13.0–17.0)
MCH: 32.9 pg (ref 26.0–34.0)
MCHC: 33.1 g/dL (ref 30.0–36.0)
MCV: 99.5 fL (ref 80.0–100.0)
Platelets: 206 10*3/uL (ref 150–400)
RBC: 3.65 MIL/uL — ABNORMAL LOW (ref 4.22–5.81)
RDW: 12.3 % (ref 11.5–15.5)
WBC: 10.3 10*3/uL (ref 4.0–10.5)
nRBC: 0 % (ref 0.0–0.2)

## 2022-01-29 LAB — GLUCOSE, CAPILLARY
Glucose-Capillary: 169 mg/dL — ABNORMAL HIGH (ref 70–99)
Glucose-Capillary: 131 mg/dL — ABNORMAL HIGH (ref 70–99)
Glucose-Capillary: 145 mg/dL — ABNORMAL HIGH (ref 70–99)
Glucose-Capillary: 141 mg/dL — ABNORMAL HIGH (ref 70–99)

## 2022-01-29 LAB — URINE CULTURE: Culture: NO GROWTH

## 2022-01-29 LAB — BASIC METABOLIC PANEL
Anion gap: 9 (ref 5–15)
BUN: 32 mg/dL — ABNORMAL HIGH (ref 6–20)
CO2: 23 mmol/L (ref 22–32)
Calcium: 9.4 mg/dL (ref 8.9–10.3)
Chloride: 104 mmol/L (ref 98–111)
Creatinine, Ser: 1.29 mg/dL — ABNORMAL HIGH (ref 0.61–1.24)
GFR, Estimated: >60 mL/min (ref >60)
Glucose, Bld: 176 mg/dL — ABNORMAL HIGH (ref 70–99)
Potassium: 4.2 mmol/L (ref 3.5–5.1)
Sodium: 136 mmol/L (ref 135–145)

## 2022-01-29 MED ORDER — METFORMIN HCL 500 MG PO TABS
500.0000 mg | ORAL_TABLET | Freq: Two times a day (BID) | ORAL | Status: DC
  Administered 2022-01-29 – 2022-01-30 (×3): 500 mg via ORAL
  Filled 2022-01-29 (×3): qty 1

## 2022-01-29 NOTE — NC FL2 (Signed)
[place] LEVEL OF CARE SCREENING TOOL     IDENTIFICATION  Patient Name: Allen Gould Birthdate: 03-Nov-1965 Sex: male Admission Date (Current Location): 01/27/2022  [facility] and [place] Number:  Herbalist and Address:  [facility] [place], [place] [address] [place], [place] [address]      Provider Number: [id number]  Attending Physician Name and Address:  Sid Falcon, MD  Relative Name and Phone Number:       Current Level of Care: Hospital Recommended Level of Care: [place] Prior Approval Number:    Date Approved/Denied:   PASRR Number: [id number]  Discharge Plan: SNF    Current Diagnoses: Patient Active Problem List   Diagnosis Date Noted   Acute renal failure superimposed on stage 3a chronic kidney disease ([place]) 01/28/2022   Acute renal failure superimposed on chronic kidney disease ([place]) 01/28/2022   Acute cystitis without hematuria    Stage 3a chronic kidney disease (CKD) ([place]) 10/07/2021   Chest pain 09/17/2021   CAD (coronary artery disease) 06/30/2021   Hypertrophic cardiomyopathy ([place]) 03/25/2021   Cardiac pacemaker in situ 03/25/2021   Normocytic anemia 11/14/2020   Acquired dilation of ascending aorta and aortic root (HCC)    Aortic stenosis    Hip pain 2/2 osteoarthritis 06/12/2020   Tachycardia-bradycardia syndrome ([place]) 06/05/2020   (HFpEF) heart failure with preserved ejection fraction ([place]) 06/02/2020   Severe depression ([place]) 04/24/2020   Physical deconditioning 04/24/2020   Second degree AV block, Mobitz type I    Bifascicular block    PAC (premature atrial contraction)    PVCs (premature ventricular contractions)    PAF (paroxysmal atrial fibrillation) ([place])    Essential hypertension 10/22/2016   Health care maintenance 03/01/2014   Morbid obesity ([place]) 08/29/2013   Type II diabetes mellitus ([place]) 08/10/2013   Asthma    GERD (gastroesophageal reflux disease)     Orientation  RESPIRATION BLADDER Height & Weight     Self, Time, Situation, Place  Normal Continent Weight: 271 lb (122.9 kg) (scale a) Height:  5' 10"  (177.8 cm)  BEHAVIORAL SYMPTOMS/MOOD NEUROLOGICAL BOWEL NUTRITION STATUS      Continent Diet (See d/c summary)  AMBULATORY STATUS COMMUNICATION OF NEEDS Skin   Limited Assist Verbally Normal                       Personal Care Assistance Level of Assistance  Bathing, Feeding, Dressing Bathing Assistance: Limited assistance Feeding assistance: Independent Dressing Assistance: Limited assistance     Functional Limitations Info  Sight, Hearing, Speech Sight Info: Adequate Hearing Info: Adequate Speech Info: Adequate    SPECIAL CARE FACTORS FREQUENCY  PT (By licensed PT), OT (By licensed OT)     PT Frequency: 5x/week OT Frequency: 5x/week            Contractures Contractures Info: Not present    Additional Factors Info  Code Status, Allergies Code Status Info: DNR Allergies Info: Shellfish Allergy           Current Medications (01/29/2022):  This is the current hospital active medication list Current Facility-Administered Medications  Medication Dose Route Frequency Provider Last Rate Last Admin   acetaminophen (TYLENOL) tablet 650 mg  650 mg Oral Q6H PRN Virl Axe, MD   650 mg at 01/29/22 0737   Or   acetaminophen (TYLENOL) suppository 650 mg  650 mg Rectal Q6H PRN Virl Axe, MD       amLODipine (NORVASC) tablet  10 mg  10 mg Oral Daily Virl Axe, MD   10 mg at 01/29/22 0933   apixaban (ELIQUIS) tablet 5 mg  5 mg Oral BID Virl Axe, MD   5 mg at 01/29/22 0933   atorvastatin (LIPITOR) tablet 80 mg  80 mg Oral Daily Virl Axe, MD   80 mg at 01/29/22 0933   cefTRIAXone (ROCEPHIN) 1 g in sodium chloride 0.9 % 100 mL IVPB  1 g Intravenous Q24H Virl Axe, MD 200 mL/hr at 01/29/22 0942 1 g at 01/29/22 0942   cyclopentolate (CYCLODRYL,CYCLOGYL) 1 % ophthalmic solution 1 drop  1 drop Left Eye BID  Ripley Fraise, MD   1 drop at 01/29/22 5681   diclofenac Sodium (VOLTAREN) 1 % topical gel 4 g  4 g Topical TID AC & HS Virl Axe, MD   4 g at 01/29/22 0935   DULoxetine (CYMBALTA) DR capsule 60 mg  60 mg Oral Daily Virl Axe, MD   60 mg at 01/29/22 0933   gabapentin (NEURONTIN) capsule 300 mg  300 mg Oral BID Virl Axe, MD   300 mg at 01/29/22 0933   insulin aspart (novoLOG) injection 0-15 Units  0-15 Units Subcutaneous TID WC Virl Axe, MD   3 Units at 01/29/22 2751   metFORMIN (GLUCOPHAGE) tablet 500 mg  500 mg Oral BID WC Mapp, Tavien, MD   500 mg at 01/29/22 0933   metoprolol succinate (TOPROL-XL) 24 hr tablet 25 mg  25 mg Oral QHS Virl Axe, MD   25 mg at 01/28/22 2014   pantoprazole (PROTONIX) EC tablet 40 mg  40 mg Oral Daily Virl Axe, MD   40 mg at 01/29/22 0933   polyethylene glycol (MIRALAX / GLYCOLAX) packet 17 g  17 g Oral Daily PRN Virl Axe, MD       prednisoLONE acetate (PRED FORTE) 1 % ophthalmic suspension 1 drop  1 drop Left Eye TID AC & HS Ripley Fraise, MD   1 drop at 01/29/22 7001     Discharge Medications: Please see discharge summary for a list of discharge medications.  Relevant Imaging Results:  Relevant Lab Results:   Additional Information SS# [id number]  [facility] Yisroel Ramming, LCSW
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
## 2022-01-29 NOTE — Progress Notes (Addendum)
Patient stated he has OSA and using CPAP at home. Refused to wear CPAP tonight. Hooked to oxygen at 2 lmp via [place]. Education given. Patient agreed and acknowledged. Plan of care ongoing.   Patient requested not to give any information to anyone over the phone.
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
## 2022-01-30 DIAGNOSIS — H538 Other visual disturbances: Secondary | ICD-10-CM | POA: Diagnosis not present

## 2022-01-30 DIAGNOSIS — N3000 Acute cystitis without hematuria: Secondary | ICD-10-CM | POA: Diagnosis not present

## 2022-01-30 DIAGNOSIS — H5712 Ocular pain, left eye: Secondary | ICD-10-CM | POA: Diagnosis not present

## 2022-01-30 DIAGNOSIS — I2510 Atherosclerotic heart disease of native coronary artery without angina pectoris: Secondary | ICD-10-CM | POA: Diagnosis not present

## 2022-01-30 DIAGNOSIS — I4891 Unspecified atrial fibrillation: Secondary | ICD-10-CM | POA: Diagnosis not present

## 2022-01-30 DIAGNOSIS — I480 Paroxysmal atrial fibrillation: Secondary | ICD-10-CM | POA: Diagnosis not present

## 2022-01-30 DIAGNOSIS — M160 Bilateral primary osteoarthritis of hip: Secondary | ICD-10-CM | POA: Diagnosis not present

## 2022-01-30 DIAGNOSIS — K219 Gastro-esophageal reflux disease without esophagitis: Secondary | ICD-10-CM | POA: Diagnosis not present

## 2022-01-30 DIAGNOSIS — I10 Essential (primary) hypertension: Secondary | ICD-10-CM | POA: Diagnosis not present

## 2022-01-30 DIAGNOSIS — Z950 Presence of cardiac pacemaker: Secondary | ICD-10-CM | POA: Diagnosis not present

## 2022-01-30 DIAGNOSIS — I130 Hypertensive heart and chronic kidney disease with heart failure and stage 1 through stage 4 chronic kidney disease, or unspecified chronic kidney disease: Secondary | ICD-10-CM | POA: Diagnosis not present

## 2022-01-30 DIAGNOSIS — I77819 Aortic ectasia, unspecified site: Secondary | ICD-10-CM | POA: Diagnosis not present

## 2022-01-30 DIAGNOSIS — N1831 Chronic kidney disease, stage 3a: Secondary | ICD-10-CM | POA: Diagnosis not present

## 2022-01-30 DIAGNOSIS — S098XXA Other specified injuries of head, initial encounter: Secondary | ICD-10-CM | POA: Diagnosis not present

## 2022-01-30 DIAGNOSIS — R5381 Other malaise: Secondary | ICD-10-CM | POA: Diagnosis not present

## 2022-01-30 DIAGNOSIS — R079 Chest pain, unspecified: Secondary | ICD-10-CM | POA: Diagnosis not present

## 2022-01-30 DIAGNOSIS — E119 Type 2 diabetes mellitus without complications: Secondary | ICD-10-CM | POA: Diagnosis not present

## 2022-01-30 DIAGNOSIS — M6281 Muscle weakness (generalized): Secondary | ICD-10-CM | POA: Diagnosis not present

## 2022-01-30 DIAGNOSIS — I5030 Unspecified diastolic (congestive) heart failure: Secondary | ICD-10-CM | POA: Diagnosis not present

## 2022-01-30 DIAGNOSIS — Z7901 Long term (current) use of anticoagulants: Secondary | ICD-10-CM | POA: Diagnosis not present

## 2022-01-30 DIAGNOSIS — R2689 Other abnormalities of gait and mobility: Secondary | ICD-10-CM | POA: Diagnosis not present

## 2022-01-30 DIAGNOSIS — I422 Other hypertrophic cardiomyopathy: Secondary | ICD-10-CM | POA: Diagnosis not present

## 2022-01-30 DIAGNOSIS — G629 Polyneuropathy, unspecified: Secondary | ICD-10-CM | POA: Diagnosis not present

## 2022-01-30 DIAGNOSIS — R531 Weakness: Secondary | ICD-10-CM | POA: Diagnosis not present

## 2022-01-30 DIAGNOSIS — Z7401 Bed confinement status: Secondary | ICD-10-CM | POA: Diagnosis not present

## 2022-01-30 DIAGNOSIS — I350 Nonrheumatic aortic (valve) stenosis: Secondary | ICD-10-CM | POA: Diagnosis not present

## 2022-01-30 DIAGNOSIS — E785 Hyperlipidemia, unspecified: Secondary | ICD-10-CM | POA: Diagnosis not present

## 2022-01-30 DIAGNOSIS — Z7984 Long term (current) use of oral hypoglycemic drugs: Secondary | ICD-10-CM | POA: Diagnosis not present

## 2022-01-30 DIAGNOSIS — M25559 Pain in unspecified hip: Secondary | ICD-10-CM | POA: Diagnosis not present

## 2022-01-30 DIAGNOSIS — R29898 Other symptoms and signs involving the musculoskeletal system: Secondary | ICD-10-CM | POA: Diagnosis not present

## 2022-01-30 DIAGNOSIS — R262 Difficulty in walking, not elsewhere classified: Secondary | ICD-10-CM | POA: Diagnosis not present

## 2022-01-30 DIAGNOSIS — Z79899 Other long term (current) drug therapy: Secondary | ICD-10-CM | POA: Diagnosis not present

## 2022-01-30 DIAGNOSIS — I495 Sick sinus syndrome: Secondary | ICD-10-CM | POA: Diagnosis not present

## 2022-01-30 DIAGNOSIS — D72829 Elevated white blood cell count, unspecified: Secondary | ICD-10-CM | POA: Diagnosis not present

## 2022-01-30 DIAGNOSIS — F32A Depression, unspecified: Secondary | ICD-10-CM | POA: Diagnosis not present

## 2022-01-30 DIAGNOSIS — J45909 Unspecified asthma, uncomplicated: Secondary | ICD-10-CM | POA: Diagnosis not present

## 2022-01-30 DIAGNOSIS — N179 Acute kidney failure, unspecified: Secondary | ICD-10-CM | POA: Diagnosis not present

## 2022-01-30 DIAGNOSIS — Z7985 Long-term (current) use of injectable non-insulin antidiabetic drugs: Secondary | ICD-10-CM | POA: Diagnosis not present

## 2022-01-30 DIAGNOSIS — D649 Anemia, unspecified: Secondary | ICD-10-CM | POA: Diagnosis not present

## 2022-01-30 DIAGNOSIS — N189 Chronic kidney disease, unspecified: Secondary | ICD-10-CM | POA: Diagnosis not present

## 2022-01-30 DIAGNOSIS — H209 Unspecified iridocyclitis: Secondary | ICD-10-CM | POA: Diagnosis not present

## 2022-01-30 DIAGNOSIS — R2681 Unsteadiness on feet: Secondary | ICD-10-CM | POA: Diagnosis not present

## 2022-01-30 DIAGNOSIS — E860 Dehydration: Secondary | ICD-10-CM | POA: Diagnosis not present

## 2022-01-30 DIAGNOSIS — E1122 Type 2 diabetes mellitus with diabetic chronic kidney disease: Secondary | ICD-10-CM | POA: Diagnosis not present

## 2022-01-30 LAB — BASIC METABOLIC PANEL
Anion gap: 9 (ref 5–15)
BUN: 26 mg/dL — ABNORMAL HIGH (ref 6–20)
CO2: 24 mmol/L (ref 22–32)
Calcium: 9.2 mg/dL (ref 8.9–10.3)
Chloride: 103 mmol/L (ref 98–111)
Creatinine, Ser: 1.23 mg/dL (ref 0.61–1.24)
GFR, Estimated: >60 mL/min (ref >60)
Glucose, Bld: 155 mg/dL — ABNORMAL HIGH (ref 70–99)
Potassium: 3.9 mmol/L (ref 3.5–5.1)
Sodium: 136 mmol/L (ref 135–145)

## 2022-01-30 LAB — CBC
HCT: 33.2 % — ABNORMAL LOW (ref 39.0–52.0)
Hemoglobin: 11.5 g/dL — ABNORMAL LOW (ref 13.0–17.0)
MCH: 33.7 pg (ref 26.0–34.0)
MCHC: 34.6 g/dL (ref 30.0–36.0)
MCV: 97.4 fL (ref 80.0–100.0)
Platelets: 193 10*3/uL (ref 150–400)
RBC: 3.41 MIL/uL — ABNORMAL LOW (ref 4.22–5.81)
RDW: 12.2 % (ref 11.5–15.5)
WBC: 10.4 10*3/uL (ref 4.0–10.5)
nRBC: 0 % (ref 0.0–0.2)

## 2022-01-30 LAB — GLUCOSE, CAPILLARY
Glucose-Capillary: 162 mg/dL — ABNORMAL HIGH (ref 70–99)
Glucose-Capillary: 135 mg/dL — ABNORMAL HIGH (ref 70–99)

## 2022-01-30 MED ORDER — PREDNISOLONE ACETATE 1 % OP SUSP: 1.0000 [drp] | Freq: Three times a day (TID) | OPHTHALMIC | 0 refills | Status: DC

## 2022-01-30 MED ORDER — DICLOFENAC SODIUM 1 % EX GEL: 4.0000 g | Freq: Three times a day (TID) | CUTANEOUS | 0 refills | Status: DC

## 2022-01-30 MED ORDER — CYCLOPENTOLATE HCL 1 % OP SOLN: 1.0000 [drp] | Freq: Two times a day (BID) | OPHTHALMIC | 0 refills | Status: DC

## 2022-01-30 NOTE — TOC Progression Note (Addendum)
Transition of Care [facility]) - Progression Note    Patient Details  Name: Allen Gould MRN: [id number] Date of Birth: November 08, 1965  Transition of Care [facility]) CM/SW [place], LCSW Phone Number: 01/30/2022, 10:20 AM  Clinical Narrative:     Pt's authorization has been approved. Plan # [id number] Auth dates:01/30/2022- 02/02/2022.  TOC team will continue to assist with discharge planning needs.   Expected Discharge Plan: [place] Barriers to Discharge: Continued Medical Work up, Ship broker  Expected Discharge Plan and Services Expected Discharge Plan: [place]       Living arrangements for the past 2 months: [place] Determinants of Health (SDOH) Interventions    Readmission Risk Interventions    09/03/2019   10:38 AM  Readmission Risk Prevention Plan  Post Dischage Appt Not Complete  Appt Comments pt states he switched his Medicaid to [facility], was seeing [facility]  Medication Screening Complete  Transportation Screening Complete

## 2022-01-30 NOTE — Discharge Instructions (Addendum)
You were hospitalized for left eye pain and hip pain.   Hospital course: We treated your eye pain with eyedrops that decrease inflammation of the eye.  We treated your hip pain with medications.  Medications:  Please start taking: -Cyclopentolate (Cyclodryl) 1% ophthalmic solution (Place 1 drop into left eye 2 times daily) for eye pain -Prednisolone acetate (Pred Forte) 1% ophthalmic suspension (Place 1 drop into left eye 4 times daily) for eye pain -Diclofenac sodium (Voltaren) 1% gel (apply 4 g to the skin 4 times daily) for hip pain  Please continue taking: -Glucose blood (Accu-Chek Guide) (check blood sugar 3 times a day) for your diabetes -Accu-Chek Softclix lancets (use to check blood sugar 3 times daily) for your diabetes -Amlodipine (Norvasc) 10 mg tablet (take 1 by mouth daily) for high blood pressure -Apixaban (Eliquis) 5 mg tablets (take 1 tablet by mouth twice daily) for thinning of your blood -Atorvastatin (Lipitor) 80 mg tablet (by mouth once daily) for high cholesterol -Duloxetine (Cymbalta) 60 mg tablet (once by mouth daily) for anxiety or depression -Furosemide (Lasix) 40 mg tablet (once by mouth daily) for high blood pressure -Gabapentin (Neurontin) 300 mg capsule (once by mouth in the morning, at noon, and at bedtime) for nerve pain -Ibuprofen (Advil) 200 mg tablet (take 600 mg by mouth 2 times daily as needed) for pain -Metformin (Glucophage) 500 mg tablet (by mouth twice daily with a meal) for your diabetes -Metoprolol succinate (Toprol-XL) 25 mg tablet (once by mouth at bedtime) for high blood pressure -Pantoprazole (Protonix) 40 mg tablet (once by mouth daily) for heartburn  -Trulicity 1.61 mg / 0.5 mL SOPN (inject 0.75 mg into the skin once weekly) for your diabetes -Valsartan (Diovan) 80 mg tablet (take once by mouth daily) for high blood pressure  Follow-Up: -Please follow-up with your primary care provider Dr. Linward Natal at the [facility] internal medicine  clinic within the next couple of weeks (clinic will call you to schedule an appointment) -Please follow-up with ophthalmology (Dr. Dema Severin can refer you to an ophthalmologist at your clinic visit)
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
## 2022-01-30 NOTE — TOC Transition Note (Addendum)
Transition of Care [facility]) - CM/SW Discharge Note   Patient Details  Name: Allen Gould MRN: [id number] Date of Birth: April 20, 1966  [facility] of [facility]) CM/SW Contact:  Bary Castilla, LCSW Phone Number:[phone] 01/30/2022, 12:13 PM   Clinical Narrative:    Patient will DC to:?[facility] date:?01/30/2022 Family notified:?None on file Transport by: Corey Harold   Per MD patient ready for DC to [facility]. RN, patient, patient's family, and facility notified of DC. Discharge Summary sent to facility. RN given number for report [phone]. DC packet on chart with DC summary per facility request. Ambulance transport requested for patient.   [name] signing off.   [place], [place] [phone]    Final next level of care: Skilled Nursing Facility Barriers to Discharge: Continued Medical Work up, Ship broker   Patient Goals and CMS Choice Patient states their goals for this hospitalization and ongoing recovery are:: Patient is in need of shelter resources.   Choice offered to / list presented to : NA  Discharge Placement              Patient chooses bed at: Other - please specify in the comment section below: [facility]) Patient to be transferred to facility by: [place] Name of family member notified: No contact on file    Discharge Plan and Services                                     Social Determinants of Health ([place]) Interventions     Readmission Risk Interventions    09/03/2019   10:38 AM  Readmission Risk Prevention Plan  Post Dischage Appt Not Complete  Appt Comments pt states he switched his Medicaid to [facility], was seeing [facility]  Medication Screening Complete  Transportation Screening Complete

## 2022-01-30 NOTE — Progress Notes (Signed)
Report attempted at [facility]. Voicemail left with call back number.

## 2022-02-01 DIAGNOSIS — E785 Hyperlipidemia, unspecified: Secondary | ICD-10-CM | POA: Diagnosis not present

## 2022-02-01 DIAGNOSIS — K219 Gastro-esophageal reflux disease without esophagitis: Secondary | ICD-10-CM | POA: Diagnosis not present

## 2022-02-01 DIAGNOSIS — I10 Essential (primary) hypertension: Secondary | ICD-10-CM | POA: Diagnosis not present

## 2022-02-01 DIAGNOSIS — I5030 Unspecified diastolic (congestive) heart failure: Secondary | ICD-10-CM | POA: Diagnosis not present

## 2022-02-01 DIAGNOSIS — N189 Chronic kidney disease, unspecified: Secondary | ICD-10-CM | POA: Diagnosis not present

## 2022-02-01 DIAGNOSIS — I4891 Unspecified atrial fibrillation: Secondary | ICD-10-CM | POA: Diagnosis not present

## 2022-02-01 DIAGNOSIS — G629 Polyneuropathy, unspecified: Secondary | ICD-10-CM | POA: Diagnosis not present

## 2022-02-01 DIAGNOSIS — I2510 Atherosclerotic heart disease of native coronary artery without angina pectoris: Secondary | ICD-10-CM | POA: Diagnosis not present

## 2022-02-01 DIAGNOSIS — R531 Weakness: Secondary | ICD-10-CM | POA: Diagnosis not present

## 2022-02-01 DIAGNOSIS — E119 Type 2 diabetes mellitus without complications: Secondary | ICD-10-CM | POA: Diagnosis not present

## 2022-02-03 ENCOUNTER — Encounter: Payer: Medicare Other | Admitting: Physical Medicine and Rehabilitation

## 2022-02-03 ENCOUNTER — Telehealth: Payer: Self-pay | Admitting: Physical Medicine and Rehabilitation

## 2022-02-03 NOTE — Telephone Encounter (Signed)
[facility] called. Need to [facility] his appointment with Dr. Ernestina Patches. [phone]

## 2022-02-04 NOTE — Telephone Encounter (Signed)
Tried calling-spoke with 2 different individuals who were unable to assist with this and could not understand why I was calling.  A message was taken and will be provided to patients nurse and they will have someone call back.

## 2022-02-07 DIAGNOSIS — R5381 Other malaise: Secondary | ICD-10-CM | POA: Diagnosis not present

## 2022-02-07 DIAGNOSIS — I495 Sick sinus syndrome: Secondary | ICD-10-CM | POA: Diagnosis not present

## 2022-02-07 DIAGNOSIS — M160 Bilateral primary osteoarthritis of hip: Secondary | ICD-10-CM | POA: Diagnosis not present

## 2022-02-07 DIAGNOSIS — G629 Polyneuropathy, unspecified: Secondary | ICD-10-CM | POA: Diagnosis not present

## 2022-02-07 DIAGNOSIS — I77819 Aortic ectasia, unspecified site: Secondary | ICD-10-CM | POA: Diagnosis not present

## 2022-02-07 DIAGNOSIS — I5030 Unspecified diastolic (congestive) heart failure: Secondary | ICD-10-CM | POA: Diagnosis not present

## 2022-02-07 DIAGNOSIS — I10 Essential (primary) hypertension: Secondary | ICD-10-CM | POA: Diagnosis not present

## 2022-02-07 DIAGNOSIS — E119 Type 2 diabetes mellitus without complications: Secondary | ICD-10-CM | POA: Diagnosis not present

## 2022-02-08 DIAGNOSIS — I10 Essential (primary) hypertension: Secondary | ICD-10-CM | POA: Diagnosis not present

## 2022-02-08 DIAGNOSIS — I2510 Atherosclerotic heart disease of native coronary artery without angina pectoris: Secondary | ICD-10-CM | POA: Diagnosis not present

## 2022-02-08 DIAGNOSIS — F32A Depression, unspecified: Secondary | ICD-10-CM | POA: Diagnosis not present

## 2022-02-08 DIAGNOSIS — E119 Type 2 diabetes mellitus without complications: Secondary | ICD-10-CM | POA: Diagnosis not present

## 2022-02-08 DIAGNOSIS — I495 Sick sinus syndrome: Secondary | ICD-10-CM | POA: Diagnosis not present

## 2022-02-08 DIAGNOSIS — I422 Other hypertrophic cardiomyopathy: Secondary | ICD-10-CM | POA: Diagnosis not present

## 2022-02-08 DIAGNOSIS — J45909 Unspecified asthma, uncomplicated: Secondary | ICD-10-CM | POA: Diagnosis not present

## 2022-02-08 DIAGNOSIS — I4891 Unspecified atrial fibrillation: Secondary | ICD-10-CM | POA: Diagnosis not present

## 2022-02-08 DIAGNOSIS — G629 Polyneuropathy, unspecified: Secondary | ICD-10-CM | POA: Diagnosis not present

## 2022-02-08 DIAGNOSIS — K219 Gastro-esophageal reflux disease without esophagitis: Secondary | ICD-10-CM | POA: Diagnosis not present

## 2022-02-09 DIAGNOSIS — I480 Paroxysmal atrial fibrillation: Secondary | ICD-10-CM | POA: Diagnosis not present

## 2022-02-09 DIAGNOSIS — E1122 Type 2 diabetes mellitus with diabetic chronic kidney disease: Secondary | ICD-10-CM | POA: Diagnosis not present

## 2022-02-09 DIAGNOSIS — J45909 Unspecified asthma, uncomplicated: Secondary | ICD-10-CM | POA: Diagnosis not present

## 2022-02-10 ENCOUNTER — Telehealth: Payer: Self-pay | Admitting: Physical Medicine and Rehabilitation

## 2022-02-10 NOTE — Telephone Encounter (Signed)
Received call from [name] with [facility] needing to reschedule an appointment for the patient.   The number to contact Vee is [phone]

## 2022-02-11 NOTE — Telephone Encounter (Signed)
I called back to schedule. Receptionist transferred me to a voicemail. Message was left.

## 2022-02-15 DIAGNOSIS — K219 Gastro-esophageal reflux disease without esophagitis: Secondary | ICD-10-CM | POA: Diagnosis not present

## 2022-02-15 DIAGNOSIS — N179 Acute kidney failure, unspecified: Secondary | ICD-10-CM | POA: Diagnosis not present

## 2022-02-15 DIAGNOSIS — I10 Essential (primary) hypertension: Secondary | ICD-10-CM | POA: Diagnosis not present

## 2022-02-15 DIAGNOSIS — J45909 Unspecified asthma, uncomplicated: Secondary | ICD-10-CM | POA: Diagnosis not present

## 2022-02-15 DIAGNOSIS — G629 Polyneuropathy, unspecified: Secondary | ICD-10-CM | POA: Diagnosis not present

## 2022-02-15 DIAGNOSIS — E119 Type 2 diabetes mellitus without complications: Secondary | ICD-10-CM | POA: Diagnosis not present

## 2022-02-15 DIAGNOSIS — M6281 Muscle weakness (generalized): Secondary | ICD-10-CM | POA: Diagnosis not present

## 2022-02-15 DIAGNOSIS — I441 Atrioventricular block, second degree: Secondary | ICD-10-CM | POA: Diagnosis not present

## 2022-02-15 DIAGNOSIS — M160 Bilateral primary osteoarthritis of hip: Secondary | ICD-10-CM | POA: Diagnosis not present

## 2022-02-15 DIAGNOSIS — I4891 Unspecified atrial fibrillation: Secondary | ICD-10-CM | POA: Diagnosis not present

## 2022-02-15 DIAGNOSIS — R2689 Other abnormalities of gait and mobility: Secondary | ICD-10-CM | POA: Diagnosis not present

## 2022-02-15 DIAGNOSIS — I495 Sick sinus syndrome: Secondary | ICD-10-CM | POA: Diagnosis not present

## 2022-02-18 DIAGNOSIS — I10 Essential (primary) hypertension: Secondary | ICD-10-CM | POA: Diagnosis not present

## 2022-02-18 DIAGNOSIS — E119 Type 2 diabetes mellitus without complications: Secondary | ICD-10-CM | POA: Diagnosis not present

## 2022-03-15 ENCOUNTER — Telehealth: Payer: Self-pay | Admitting: Physical Medicine and Rehabilitation

## 2022-03-15 NOTE — Telephone Encounter (Signed)
Pt called requesting a call back to set an appt with Dr Ernestina Patches for a injection. Please call pt at [phone]

## 2022-03-16 NOTE — Telephone Encounter (Signed)
Scheduled. He is able to use walker to get around and feels confident he will be fine to get on table.

## 2022-03-16 NOTE — Telephone Encounter (Signed)
Patient was scheduled previously about a month ago for bilateral hip injections but was in a nursing facility and he said that they cancelled his appointment.  He previously had bilat injections earlier this year. States he needs bilateral THA but needs some relief until he can get this done.  He does have diabetes. Ok to schedule from referral placed in August for bilat hip injections?

## 2022-03-25 ENCOUNTER — Ambulatory Visit: Payer: Medicare Other | Admitting: Physical Medicine and Rehabilitation

## 2022-03-25 NOTE — Telephone Encounter (Signed)
Patient called needing to reschedule his appointment. Patient said his transportation did not come to get him. The number to contact patient is [phone]

## 2022-03-31 NOTE — Telephone Encounter (Signed)
Spoke with patient and he is scheduled for 04/08/22

## 2022-04-08 ENCOUNTER — Ambulatory Visit (INDEPENDENT_AMBULATORY_CARE_PROVIDER_SITE_OTHER): Payer: Medicare Other | Admitting: Physical Medicine and Rehabilitation

## 2022-04-08 ENCOUNTER — Ambulatory Visit: Payer: Self-pay

## 2022-04-08 DIAGNOSIS — M25551 Pain in right hip: Secondary | ICD-10-CM | POA: Diagnosis not present

## 2022-04-08 DIAGNOSIS — M25552 Pain in left hip: Secondary | ICD-10-CM

## 2022-04-08 NOTE — Progress Notes (Signed)
Numeric Pain Rating Scale and Functional Assessment Average Pain 10   In the last MONTH (on 0-10 scale) has pain interfered with the following?  1. General activity like being  able to carry out your everyday physical activities such as walking, climbing stairs, carrying groceries, or moving a chair?  Rating(10)  Has been taking ibuprofen for any relief, states he is not supposed to take ibuprofen but the only kind of relief he can get some times.  +Driver, -BT, -Dye Allergies.  Does take blood thinner

## 2022-04-14 MED ORDER — TRIAMCINOLONE ACETONIDE 40 MG/ML IJ SUSP
40.0000 mg | INTRAMUSCULAR | Status: AC | PRN
  Administered 2022-04-08: 40 mg via INTRA_ARTICULAR

## 2022-04-14 MED ORDER — BUPIVACAINE HCL 0.25 % IJ SOLN
5.0000 mL | INTRAMUSCULAR | Status: AC | PRN
  Administered 2022-04-08: 5 mL via INTRA_ARTICULAR

## 2022-04-14 NOTE — Progress Notes (Signed)
   Allen Gould - 56 y.o. male MRN [id number]  Date of birth: 10-05-1965  Office Visit Note: Visit Date: 04/08/2022 PCP: Linward Natal, MD Referred by: Linward Natal, MD  Subjective: Chief Complaint  Patient presents with   Right Hip - Pain    Bilateral hip injections   Left Hip - Pain   HPI:  Allen Gould is a 56 y.o. male who comes in today at the request of Dr. Eduard Roux for planned Bilateral anesthetic hip arthrogram with fluoroscopic guidance.  The patient has failed conservative care including home exercise, medications, time and activity modification.  This injection will be diagnostic and hopefully therapeutic.  Please see requesting physician notes for further details and justification.   ROS Otherwise per HPI.  Assessment & Plan: Visit Diagnoses:    ICD-10-CM   1. Pain in left hip  M25.552 XR C-ARM NO REPORT    2. Pain in right hip  M25.551 XR C-ARM NO REPORT      Plan: No additional findings.   Meds & Orders: No orders of the defined types were placed in this encounter.   Orders Placed This Encounter  Procedures   Large Joint Inj   XR C-ARM NO REPORT    Follow-up: No follow-ups on file.   Procedures: Large Joint Inj: bilateral hip joint on 04/08/2022 1:30 PM Indications: diagnostic evaluation and pain Details: 22 G 3.5 in needle, fluoroscopy-guided anterior approach  Arthrogram: No  Medications (Right): 40 mg triamcinolone acetonide 40 MG/ML; 5 mL bupivacaine 0.25 % Medications (Left): 40 mg triamcinolone acetonide 40 MG/ML; 5 mL bupivacaine 0.25 % Outcome: tolerated well, no immediate complications  There was excellent flow of contrast producing a partial arthrogram of the hips. The patient did have relief of symptoms during the anesthetic phase of the injection. Procedure, treatment alternatives, risks and benefits explained, specific risks discussed. Consent was given by the patient. Immediately prior to procedure a time out was called to  verify the correct patient, procedure, equipment, support staff and site/side marked as required. Patient was prepped and draped in the usual sterile fashion.          Clinical History: No specialty comments available.     Objective:  VS:  HT:    WT:   BMI:     BP:   HR: bpm  TEMP: ( )  RESP:  Physical Exam   Imaging: No results found.

## 2022-04-20 ENCOUNTER — Ambulatory Visit (INDEPENDENT_AMBULATORY_CARE_PROVIDER_SITE_OTHER): Payer: Medicare Other | Admitting: Orthopaedic Surgery

## 2022-04-20 ENCOUNTER — Encounter: Payer: Self-pay | Admitting: Orthopaedic Surgery

## 2022-04-20 VITALS — Ht 69.5 in | Wt 297.0 lb

## 2022-04-20 DIAGNOSIS — M1611 Unilateral primary osteoarthritis, right hip: Secondary | ICD-10-CM

## 2022-04-20 DIAGNOSIS — M1612 Unilateral primary osteoarthritis, left hip: Secondary | ICD-10-CM

## 2022-04-20 DIAGNOSIS — Z6841 Body Mass Index (BMI) 40.0 and over, adult: Secondary | ICD-10-CM

## 2022-04-20 MED ORDER — TRAMADOL HCL 50 MG PO TABS: 50.0000 mg | ORAL_TABLET | Freq: Every day | ORAL | 0 refills | Status: DC | PRN

## 2022-04-20 NOTE — Progress Notes (Signed)
Office Visit Note   Patient: Allen Gould           Date of Birth: 08/23/1965           MRN: [id number] Visit Date: 04/20/2022              Requested by: Linward Natal, MD [address] [place],  [place] [address] PCP: Linward Natal, MD   Assessment & Plan: Visit Diagnoses:  1. Primary osteoarthritis of right hip   2. Primary osteoarthritis of left hip   3. Body mass index 40.0-44.9, adult [facility])     Plan: Allen Gould is fairly complex medically.  He has gotten quite deconditioned from his hospitalization and stay at the nursing home.  He has gained quite a bit of weight as well which puts his BMI greater than 40.  Currently he is not a surgical candidate and in general at least moderate risk for any joint replacement surgery.  I think the best thing for him is to continue to work on weight loss and to work on conditioning his body.  He can follow-up with [place] once he has achieved his goal weight.  Tramadol was prescribed today for severe pain.  I explained that this will not be refilled.  He may want to talk to his primary care doctor if he needs chronic pain management.  Were also happy to refer him to a pain clinic if needed.  Total face to face encounter time was greater than 25 minutes and over half of this time was spent in counseling and/or coordination of care.  Follow-Up Instructions: No follow-ups on file.   Orders:  No orders of the defined types were placed in this encounter.  Meds ordered this encounter  Medications   traMADol (ULTRAM) 50 MG tablet    Sig: Take 1-2 tablets (50-100 mg total) by mouth daily as needed.    Dispense:  30 tablet    Refill:  0      Procedures: No procedures performed   Clinical Data: No additional findings.   Subjective: Chief Complaint  Patient presents with   Right Hip - Pain   Left Hip - Pain    HPI Allen Gould is a 56 year old gentleman who comes in for follow-up of left hip DJD worse on the left.  Dr. Ernestina Patches did a cortisone  injection about 2 weeks ago in both hips.  He feels good on the right side but the relief is already worn off on the left side.  Currently using a walker.  States that he was in a nursing home for a period of time earlier this year.  Review of Systems  Constitutional: Negative.   HENT: Negative.    Eyes: Negative.   Respiratory: Negative.    Cardiovascular: Negative.   Gastrointestinal: Negative.   Endocrine: Negative.   Genitourinary: Negative.   Skin: Negative.   Allergic/Immunologic: Negative.   Neurological: Negative.   Hematological: Negative.   Psychiatric/Behavioral: Negative.    All other systems reviewed and are negative.    Objective: Vital Signs: Ht 5' 9.5" (1.765 m)   Wt 297 lb (134.7 kg)   BMI 43.23 kg/m   Physical Exam Vitals and nursing note reviewed.  Constitutional:      Appearance: He is well-developed.  HENT:     Head: Normocephalic and atraumatic.  Eyes:     Pupils: Pupils are equal, round, and reactive to light.  Pulmonary:     Effort: Pulmonary effort is normal.  Abdominal:  Palpations: Abdomen is soft.  Musculoskeletal:        General: Normal range of motion.     Cervical back: Neck supple.  Skin:    General: Skin is warm.  Neurological:     Mental Status: He is alert and oriented to person, place, and time.  Psychiatric:        Behavior: Behavior normal.        Thought Content: Thought content normal.        Judgment: Judgment normal.     Ortho Exam Examination of bilateral hips is unchanged. Specialty Comments:  No specialty comments available.  Imaging: No results found.   PMFS History: Patient Active Problem List   Diagnosis Date Noted   Acute renal failure superimposed on stage 3a chronic kidney disease ([place]) 01/28/2022   Acute renal failure superimposed on chronic kidney disease ([place]) 01/28/2022   Acute cystitis without hematuria    Stage 3a chronic kidney disease (CKD) ([place]) 10/07/2021   Chest pain 09/17/2021   CAD  (coronary artery disease) 06/30/2021   Hypertrophic cardiomyopathy ([place]) 03/25/2021   Cardiac pacemaker in situ 03/25/2021   Normocytic anemia 11/14/2020   Acquired dilation of ascending aorta and aortic root (HCC)    Aortic stenosis    Hip pain 2/2 osteoarthritis 06/12/2020   Tachycardia-bradycardia syndrome ([place]) 06/05/2020   (HFpEF) heart failure with preserved ejection fraction ([place]) 06/02/2020   Severe depression ([place]) 04/24/2020   Physical deconditioning 04/24/2020   Second degree AV block, Mobitz type I    Bifascicular block    PAC (premature atrial contraction)    PVCs (premature ventricular contractions)    PAF (paroxysmal atrial fibrillation) ([place])    Essential hypertension 10/22/2016   Health care maintenance 03/01/2014   Morbid obesity ([place]) 08/29/2013   Type II diabetes mellitus ([place]) 08/10/2013   Asthma    GERD (gastroesophageal reflux disease)    Past Medical History:  Diagnosis Date   Acquired dilation of ascending aorta and aortic root ([place])    537m by echo 09/2020 but normal on Chest CTA 05/2020   Acute kidney injury ([place] 09/01/2019   Asthma    Balanitis 08/09/2014   Bifascicular block    Cough 08/09/2014   Diabetes mellitus without complication ([place]    Dilated aortic root ([place]    448mon echo 09/2020   DM (diabetes mellitus) type 2, uncontrolled, without ketoacidosis 08/10/2013   Dyslipidemia 03/01/2014   Financial difficulties 08/09/2014   GERD (gastroesophageal reflux disease)    Health care maintenance 03/01/2014   HOCM (hypertrophic obstructive cardiomyopathy) ([place]   noted on cMRI but no SAM or LVOT gradient   Hypertension    Hypomagnesemia 05/29/2017   Insomnia 08/29/2013   Mitral stenosis    mean MVG 37m92m   Morbid obesity ([place]CC[place]4/12/2013   Near syncope 05/29/2017   Odontogenic infection of jaw 05/29/2017   PAC (premature atrial contraction)    PAF (paroxysmal atrial fibrillation) ([place]  Pressure injury of skin 09/02/2019   PVCs  (premature ventricular contractions)    Right shoulder pain 08/29/2013   Second degree AV block, Mobitz type I    Sepsis ([place]1/10/2017   Sleep apnea    uses cpap    Family History  Problem Relation Age of Onset   Kidney disease Mother    Diabetes Sister    Hyperlipidemia Sister    Heart disease Other     Past Surgical History:  Procedure Laterality Date   APPENDECTOMY  MASS EXCISION N/A 08/19/2020   Procedure: EXCISION OF SCROTAL CYSTS;  Surgeon: Robley Fries, MD;  [facility];  Service: Urology;  Laterality: N/A;  1 HR   PACEMAKER IMPLANT N/A 06/24/2020   Procedure: PACEMAKER IMPLANT;  Surgeon: Vickie Epley, MD;  Location: [facility];  Service: Cardiovascular;  Laterality: N/A;   Social History   Occupational History   Not on file  Tobacco Use   Smoking status: Former    Types: Cigarettes    Quit date: 04/24/2005    Years since quitting: 17.0   Smokeless tobacco: Never  Vaping Use   Vaping Use: Never used  Substance and Sexual Activity   Alcohol use: Not Currently    Alcohol/week: 2.0 standard drinks of alcohol    Types: 2 Cans of beer per week    Comment: Last drink 8 months ago   Drug use: No   Sexual activity: Not on file

## 2022-04-28 DIAGNOSIS — I700 Atherosclerosis of aorta: Secondary | ICD-10-CM | POA: Diagnosis not present

## 2022-04-28 DIAGNOSIS — Z0000 Encounter for general adult medical examination without abnormal findings: Secondary | ICD-10-CM | POA: Diagnosis not present

## 2022-04-28 DIAGNOSIS — Z23 Encounter for immunization: Secondary | ICD-10-CM | POA: Diagnosis not present

## 2022-04-28 DIAGNOSIS — N1831 Chronic kidney disease, stage 3a: Secondary | ICD-10-CM | POA: Diagnosis not present

## 2022-04-28 DIAGNOSIS — D6869 Other thrombophilia: Secondary | ICD-10-CM | POA: Diagnosis not present

## 2022-04-28 DIAGNOSIS — Z79899 Other long term (current) drug therapy: Secondary | ICD-10-CM | POA: Diagnosis not present

## 2022-04-28 DIAGNOSIS — M1990 Unspecified osteoarthritis, unspecified site: Secondary | ICD-10-CM | POA: Diagnosis not present

## 2022-04-28 DIAGNOSIS — E1140 Type 2 diabetes mellitus with diabetic neuropathy, unspecified: Secondary | ICD-10-CM | POA: Diagnosis not present

## 2022-05-05 DIAGNOSIS — E1140 Type 2 diabetes mellitus with diabetic neuropathy, unspecified: Secondary | ICD-10-CM | POA: Diagnosis not present

## 2022-05-05 DIAGNOSIS — I422 Other hypertrophic cardiomyopathy: Secondary | ICD-10-CM | POA: Diagnosis not present

## 2022-05-05 DIAGNOSIS — Z87891 Personal history of nicotine dependence: Secondary | ICD-10-CM | POA: Diagnosis not present

## 2022-05-05 DIAGNOSIS — E1159 Type 2 diabetes mellitus with other circulatory complications: Secondary | ICD-10-CM | POA: Diagnosis not present

## 2022-05-05 DIAGNOSIS — D6869 Other thrombophilia: Secondary | ICD-10-CM | POA: Diagnosis not present

## 2022-05-05 DIAGNOSIS — I2510 Atherosclerotic heart disease of native coronary artery without angina pectoris: Secondary | ICD-10-CM | POA: Diagnosis not present

## 2022-05-05 DIAGNOSIS — G4733 Obstructive sleep apnea (adult) (pediatric): Secondary | ICD-10-CM | POA: Diagnosis not present

## 2022-05-07 ENCOUNTER — Ambulatory Visit: Payer: Self-pay | Admitting: Licensed Clinical Social Worker

## 2022-05-07 NOTE — Patient Outreach (Signed)
SW removed from care team.  [name], BSW , MSW, [place] Social Worker [facility] Management  [phone]

## 2022-05-28 ENCOUNTER — Ambulatory Visit
Admission: RE | Admit: 2022-05-28 | Discharge: 2022-05-28 | Disposition: A | Discharge status: Home / Self Care | Payer: Commercial Managed Care - HMO | Source: Ambulatory Visit | Attending: Family Medicine | Admitting: Family Medicine

## 2022-05-28 ENCOUNTER — Other Ambulatory Visit: Payer: Self-pay | Admitting: Family Medicine

## 2022-05-28 DIAGNOSIS — M19019 Primary osteoarthritis, unspecified shoulder: Secondary | ICD-10-CM

## 2022-10-23 ENCOUNTER — Other Ambulatory Visit: Payer: Self-pay

## 2022-10-23 ENCOUNTER — Encounter (HOSPITAL_COMMUNITY): Payer: Self-pay

## 2022-10-23 ENCOUNTER — Inpatient Hospital Stay (HOSPITAL_COMMUNITY)
Admission: EM | Admit: 2022-10-23 | Discharge: 2022-11-03 | DRG: 190 | Disposition: A | Discharge status: Skilled Nursing Facility | Payer: Medicare HMO | Attending: Family Medicine | Admitting: Family Medicine

## 2022-10-23 ENCOUNTER — Observation Stay (HOSPITAL_COMMUNITY): Payer: Medicare HMO

## 2022-10-23 ENCOUNTER — Emergency Department (HOSPITAL_COMMUNITY): Payer: Medicare HMO

## 2022-10-23 DIAGNOSIS — Z79899 Other long term (current) drug therapy: Secondary | ICD-10-CM

## 2022-10-23 DIAGNOSIS — R45851 Suicidal ideations: Secondary | ICD-10-CM | POA: Diagnosis present

## 2022-10-23 DIAGNOSIS — Z1152 Encounter for screening for COVID-19: Secondary | ICD-10-CM

## 2022-10-23 DIAGNOSIS — B3789 Other sites of candidiasis: Secondary | ICD-10-CM | POA: Diagnosis not present

## 2022-10-23 DIAGNOSIS — F17210 Nicotine dependence, cigarettes, uncomplicated: Secondary | ICD-10-CM | POA: Diagnosis present

## 2022-10-23 DIAGNOSIS — I421 Obstructive hypertrophic cardiomyopathy: Secondary | ICD-10-CM | POA: Diagnosis present

## 2022-10-23 DIAGNOSIS — N5089 Other specified disorders of the male genital organs: Secondary | ICD-10-CM

## 2022-10-23 DIAGNOSIS — I495 Sick sinus syndrome: Secondary | ICD-10-CM | POA: Diagnosis present

## 2022-10-23 DIAGNOSIS — F1910 Other psychoactive substance abuse, uncomplicated: Secondary | ICD-10-CM | POA: Diagnosis present

## 2022-10-23 DIAGNOSIS — I480 Paroxysmal atrial fibrillation: Secondary | ICD-10-CM | POA: Diagnosis present

## 2022-10-23 DIAGNOSIS — N433 Hydrocele, unspecified: Secondary | ICD-10-CM | POA: Diagnosis present

## 2022-10-23 DIAGNOSIS — R739 Hyperglycemia, unspecified: Secondary | ICD-10-CM

## 2022-10-23 DIAGNOSIS — L304 Erythema intertrigo: Secondary | ICD-10-CM

## 2022-10-23 DIAGNOSIS — I2510 Atherosclerotic heart disease of native coronary artery without angina pectoris: Secondary | ICD-10-CM | POA: Diagnosis present

## 2022-10-23 DIAGNOSIS — Z8249 Family history of ischemic heart disease and other diseases of the circulatory system: Secondary | ICD-10-CM

## 2022-10-23 DIAGNOSIS — I5030 Unspecified diastolic (congestive) heart failure: Secondary | ICD-10-CM | POA: Diagnosis present

## 2022-10-23 DIAGNOSIS — Z7984 Long term (current) use of oral hypoglycemic drugs: Secondary | ICD-10-CM

## 2022-10-23 DIAGNOSIS — T380X5A Adverse effect of glucocorticoids and synthetic analogues, initial encounter: Secondary | ICD-10-CM | POA: Diagnosis not present

## 2022-10-23 DIAGNOSIS — Z716 Tobacco abuse counseling: Secondary | ICD-10-CM

## 2022-10-23 DIAGNOSIS — I5032 Chronic diastolic (congestive) heart failure: Secondary | ICD-10-CM | POA: Diagnosis not present

## 2022-10-23 DIAGNOSIS — E1165 Type 2 diabetes mellitus with hyperglycemia: Secondary | ICD-10-CM | POA: Diagnosis not present

## 2022-10-23 DIAGNOSIS — E119 Type 2 diabetes mellitus without complications: Secondary | ICD-10-CM

## 2022-10-23 DIAGNOSIS — J441 Chronic obstructive pulmonary disease with (acute) exacerbation: Principal | ICD-10-CM

## 2022-10-23 DIAGNOSIS — R0602 Shortness of breath: Secondary | ICD-10-CM | POA: Diagnosis not present

## 2022-10-23 DIAGNOSIS — I77810 Thoracic aortic ectasia: Secondary | ICD-10-CM | POA: Diagnosis present

## 2022-10-23 DIAGNOSIS — Z59812 Housing instability, housed, homelessness in past 12 months: Secondary | ICD-10-CM | POA: Diagnosis present

## 2022-10-23 DIAGNOSIS — F1021 Alcohol dependence, in remission: Secondary | ICD-10-CM | POA: Diagnosis present

## 2022-10-23 DIAGNOSIS — J449 Chronic obstructive pulmonary disease, unspecified: Secondary | ICD-10-CM | POA: Diagnosis not present

## 2022-10-23 DIAGNOSIS — J440 Chronic obstructive pulmonary disease with acute lower respiratory infection: Secondary | ICD-10-CM | POA: Diagnosis present

## 2022-10-23 DIAGNOSIS — Z66 Do not resuscitate: Secondary | ICD-10-CM | POA: Diagnosis present

## 2022-10-23 DIAGNOSIS — Z841 Family history of disorders of kidney and ureter: Secondary | ICD-10-CM

## 2022-10-23 DIAGNOSIS — Z59819 Housing instability, housed unspecified: Secondary | ICD-10-CM

## 2022-10-23 DIAGNOSIS — E1122 Type 2 diabetes mellitus with diabetic chronic kidney disease: Secondary | ICD-10-CM | POA: Diagnosis present

## 2022-10-23 DIAGNOSIS — Z91013 Allergy to seafood: Secondary | ICD-10-CM

## 2022-10-23 DIAGNOSIS — F1490 Cocaine use, unspecified, uncomplicated: Secondary | ICD-10-CM | POA: Diagnosis present

## 2022-10-23 DIAGNOSIS — N1831 Chronic kidney disease, stage 3a: Secondary | ICD-10-CM | POA: Diagnosis present

## 2022-10-23 DIAGNOSIS — K219 Gastro-esophageal reflux disease without esophagitis: Secondary | ICD-10-CM | POA: Diagnosis present

## 2022-10-23 DIAGNOSIS — Z751 Person awaiting admission to adequate facility elsewhere: Secondary | ICD-10-CM

## 2022-10-23 DIAGNOSIS — F322 Major depressive disorder, single episode, severe without psychotic features: Secondary | ICD-10-CM | POA: Diagnosis present

## 2022-10-23 DIAGNOSIS — J189 Pneumonia, unspecified organism: Secondary | ICD-10-CM | POA: Diagnosis present

## 2022-10-23 DIAGNOSIS — Z7985 Long-term (current) use of injectable non-insulin antidiabetic drugs: Secondary | ICD-10-CM

## 2022-10-23 DIAGNOSIS — Z7901 Long term (current) use of anticoagulants: Secondary | ICD-10-CM

## 2022-10-23 DIAGNOSIS — Z833 Family history of diabetes mellitus: Secondary | ICD-10-CM

## 2022-10-23 DIAGNOSIS — I130 Hypertensive heart and chronic kidney disease with heart failure and stage 1 through stage 4 chronic kidney disease, or unspecified chronic kidney disease: Secondary | ICD-10-CM | POA: Diagnosis present

## 2022-10-23 DIAGNOSIS — Z7151 Drug abuse counseling and surveillance of drug abuser: Secondary | ICD-10-CM

## 2022-10-23 DIAGNOSIS — F332 Major depressive disorder, recurrent severe without psychotic features: Secondary | ICD-10-CM

## 2022-10-23 DIAGNOSIS — Z950 Presence of cardiac pacemaker: Secondary | ICD-10-CM

## 2022-10-23 DIAGNOSIS — E785 Hyperlipidemia, unspecified: Secondary | ICD-10-CM | POA: Diagnosis present

## 2022-10-23 DIAGNOSIS — Z6841 Body Mass Index (BMI) 40.0 and over, adult: Secondary | ICD-10-CM

## 2022-10-23 DIAGNOSIS — B372 Candidiasis of skin and nail: Secondary | ICD-10-CM | POA: Diagnosis present

## 2022-10-23 DIAGNOSIS — I080 Rheumatic disorders of both mitral and aortic valves: Secondary | ICD-10-CM | POA: Diagnosis present

## 2022-10-23 DIAGNOSIS — R3915 Urgency of urination: Secondary | ICD-10-CM | POA: Diagnosis present

## 2022-10-23 LAB — GLUCOSE, CAPILLARY: Glucose-Capillary: 289 mg/dL — ABNORMAL HIGH (ref 70–99)

## 2022-10-23 LAB — CBC WITH DIFFERENTIAL/PLATELET
Abs Immature Granulocytes: 0.11 10*3/uL — ABNORMAL HIGH (ref 0.00–0.07)
Basophils Absolute: 0 10*3/uL (ref 0.0–0.1)
Basophils Relative: 0 %
Eosinophils Absolute: 0.2 10*3/uL (ref 0.0–0.5)
Eosinophils Relative: 2 %
HCT: 40 % (ref 39.0–52.0)
Hemoglobin: 13 g/dL (ref 13.0–17.0)
Immature Granulocytes: 1 %
Lymphocytes Relative: 13 %
Lymphs Abs: 1.4 10*3/uL (ref 0.7–4.0)
MCH: 31.2 pg (ref 26.0–34.0)
MCHC: 32.5 g/dL (ref 30.0–36.0)
MCV: 95.9 fL (ref 80.0–100.0)
Monocytes Absolute: 0.7 10*3/uL (ref 0.1–1.0)
Monocytes Relative: 7 %
Neutro Abs: 8.4 10*3/uL — ABNORMAL HIGH (ref 1.7–7.7)
Neutrophils Relative %: 77 %
Platelets: 240 10*3/uL (ref 150–400)
RBC: 4.17 MIL/uL — ABNORMAL LOW (ref 4.22–5.81)
RDW: 11.9 % (ref 11.5–15.5)
WBC: 10.8 10*3/uL — ABNORMAL HIGH (ref 4.0–10.5)
nRBC: 0 % (ref 0.0–0.2)

## 2022-10-23 LAB — COMPREHENSIVE METABOLIC PANEL
ALT: 21 U/L (ref 0–44)
AST: 21 U/L (ref 15–41)
Albumin: 3.4 g/dL — ABNORMAL LOW (ref 3.5–5.0)
Alkaline Phosphatase: 110 U/L (ref 38–126)
Anion gap: 14 (ref 5–15)
BUN: 18 mg/dL (ref 6–20)
CO2: 23 mmol/L (ref 22–32)
Calcium: 9.7 mg/dL (ref 8.9–10.3)
Chloride: 92 mmol/L — ABNORMAL LOW (ref 98–111)
Creatinine, Ser: 1.38 mg/dL — ABNORMAL HIGH (ref 0.61–1.24)
GFR, Estimated: 59 mL/min — ABNORMAL LOW (ref >60)
Glucose, Bld: 673 mg/dL (ref 70–99)
Potassium: 4.9 mmol/L (ref 3.5–5.1)
Sodium: 129 mmol/L — ABNORMAL LOW (ref 135–145)
Total Bilirubin: 0.9 mg/dL (ref 0.3–1.2)
Total Protein: 7.5 g/dL (ref 6.5–8.1)

## 2022-10-23 LAB — I-STAT VENOUS BLOOD GAS, ED
Acid-base deficit: 1 mmol/L (ref 0.0–2.0)
Bicarbonate: 25.9 mmol/L (ref 20.0–28.0)
Calcium, Ion: 1.24 mmol/L (ref 1.15–1.40)
HCT: 42 % (ref 39.0–52.0)
Hemoglobin: 14.3 g/dL (ref 13.0–17.0)
O2 Saturation: 44 %
Potassium: 4.9 mmol/L (ref 3.5–5.1)
Sodium: 130 mmol/L — ABNORMAL LOW (ref 135–145)
TCO2: 27 mmol/L (ref 22–32)
pCO2, Ven: 48.3 mmHg (ref 44–60)
pH, Ven: 7.337 (ref 7.25–7.43)
pO2, Ven: 26 mmHg — CL (ref 32–45)

## 2022-10-23 LAB — CBG MONITORING, ED
Glucose-Capillary: 377 mg/dL — ABNORMAL HIGH (ref 70–99)
Glucose-Capillary: 401 mg/dL — ABNORMAL HIGH (ref 70–99)

## 2022-10-23 LAB — URINALYSIS, W/ REFLEX TO CULTURE (INFECTION SUSPECTED)
Bacteria, UA: NONE SEEN
Bilirubin Urine: NEGATIVE
Glucose, UA: >=500 mg/dL — AB
Ketones, ur: NEGATIVE mg/dL
Nitrite: NEGATIVE
Protein, ur: NEGATIVE mg/dL
Specific Gravity, Urine: 1.028 (ref 1.005–1.030)
pH: 6 (ref 5.0–8.0)

## 2022-10-23 LAB — BRAIN NATRIURETIC PEPTIDE: B Natriuretic Peptide: 81.4 pg/mL (ref 0.0–100.0)

## 2022-10-23 LAB — HIV ANTIBODY (ROUTINE TESTING W REFLEX): HIV Screen 4th Generation wRfx: NONREACTIVE

## 2022-10-23 LAB — TROPONIN I (HIGH SENSITIVITY)
Troponin I (High Sensitivity): 21 ng/L — ABNORMAL HIGH (ref <18)
Troponin I (High Sensitivity): 21 ng/L — ABNORMAL HIGH

## 2022-10-23 LAB — SARS CORONAVIRUS 2 BY RT PCR: SARS Coronavirus 2 by RT PCR: NEGATIVE

## 2022-10-23 MED ORDER — INSULIN GLARGINE-YFGN 100 UNIT/ML ~~LOC~~ SOLN
10.0000 [IU] | Freq: Every day | SUBCUTANEOUS | Status: DC
  Filled 2022-10-23: qty 0.1

## 2022-10-23 MED ORDER — MAGNESIUM SULFATE 2 GM/50ML IV SOLN
2.0000 g | Freq: Once | INTRAVENOUS | Status: AC
  Administered 2022-10-23: 2 g via INTRAVENOUS
  Filled 2022-10-23: qty 50

## 2022-10-23 MED ORDER — ALBUTEROL SULFATE (2.5 MG/3ML) 0.083% IN NEBU
2.5000 mg | INHALATION_SOLUTION | RESPIRATORY_TRACT | Status: DC | PRN
Start: 2022-10-23 — End: 2022-10-23

## 2022-10-23 MED ORDER — GABAPENTIN 300 MG PO CAPS
300.0000 mg | ORAL_CAPSULE | Freq: Three times a day (TID) | ORAL | Status: DC
  Administered 2022-10-24 – 2022-11-03 (×30): 300 mg via ORAL
  Filled 2022-10-23 (×30): qty 1

## 2022-10-23 MED ORDER — METOPROLOL SUCCINATE ER 25 MG PO TB24
25.0000 mg | ORAL_TABLET | Freq: Every day | ORAL | Status: DC
  Administered 2022-10-24 – 2022-11-02 (×10): 25 mg via ORAL
  Filled 2022-10-23 (×10): qty 1

## 2022-10-23 MED ORDER — AMLODIPINE BESYLATE 10 MG PO TABS
10.0000 mg | ORAL_TABLET | Freq: Every day | ORAL | Status: DC
  Administered 2022-10-24 – 2022-11-02 (×10): 10 mg via ORAL
  Filled 2022-10-23 (×10): qty 1

## 2022-10-23 MED ORDER — AZITHROMYCIN 250 MG PO TABS
250.0000 mg | ORAL_TABLET | Freq: Every day | ORAL | Status: AC
  Administered 2022-10-24 – 2022-10-27 (×4): 250 mg via ORAL
  Filled 2022-10-23 (×4): qty 1

## 2022-10-23 MED ORDER — IPRATROPIUM-ALBUTEROL 0.5-2.5 (3) MG/3ML IN SOLN: 3.0000 mL | RESPIRATORY_TRACT | Status: DC | PRN

## 2022-10-23 MED ORDER — INSULIN GLARGINE-YFGN 100 UNIT/ML ~~LOC~~ SOLN
10.0000 [IU] | Freq: Once | SUBCUTANEOUS | Status: DC
Start: 2022-10-23 — End: 2022-10-23

## 2022-10-23 MED ORDER — INSULIN ASPART 100 UNIT/ML IJ SOLN
0.0000 [IU] | Freq: Three times a day (TID) | INTRAMUSCULAR | Status: DC
  Administered 2022-10-24 – 2022-10-25 (×4): 15 [IU] via SUBCUTANEOUS

## 2022-10-23 MED ORDER — DULOXETINE HCL 60 MG PO CPEP
90.0000 mg | ORAL_CAPSULE | Freq: Every day | ORAL | Status: DC
  Administered 2022-10-24: 90 mg via ORAL
  Filled 2022-10-23: qty 1

## 2022-10-23 MED ORDER — IRBESARTAN 150 MG PO TABS
75.0000 mg | ORAL_TABLET | Freq: Every day | ORAL | Status: DC
  Administered 2022-10-24 – 2022-11-02 (×10): 75 mg via ORAL
  Filled 2022-10-23 (×10): qty 1

## 2022-10-23 MED ORDER — IPRATROPIUM-ALBUTEROL 0.5-2.5 (3) MG/3ML IN SOLN
3.0000 mL | Freq: Once | RESPIRATORY_TRACT | Status: AC
  Administered 2022-10-23: 3 mL via RESPIRATORY_TRACT
  Filled 2022-10-23: qty 3

## 2022-10-23 MED ORDER — NYSTATIN 100000 UNIT/GM EX POWD
Freq: Once | CUTANEOUS | Status: AC
  Filled 2022-10-23 (×2): qty 15

## 2022-10-23 MED ORDER — SODIUM CHLORIDE 0.9 % IV SOLN
1.0000 g | INTRAVENOUS | Status: AC
  Administered 2022-10-23 – 2022-10-27 (×5): 1 g via INTRAVENOUS
  Filled 2022-10-23 (×5): qty 10

## 2022-10-23 MED ORDER — ATORVASTATIN CALCIUM 80 MG PO TABS
80.0000 mg | ORAL_TABLET | Freq: Every day | ORAL | Status: DC
  Administered 2022-10-24 – 2022-11-03 (×11): 80 mg via ORAL
  Filled 2022-10-23 (×11): qty 1

## 2022-10-23 MED ORDER — AZITHROMYCIN 250 MG PO TABS
500.0000 mg | ORAL_TABLET | Freq: Every day | ORAL | Status: AC
  Administered 2022-10-23: 500 mg via ORAL
  Filled 2022-10-23: qty 2

## 2022-10-23 MED ORDER — FUROSEMIDE 40 MG PO TABS
40.0000 mg | ORAL_TABLET | Freq: Every day | ORAL | Status: DC
  Administered 2022-10-24 – 2022-11-03 (×11): 40 mg via ORAL
  Filled 2022-10-23 (×11): qty 1

## 2022-10-23 MED ORDER — NYSTATIN 100000 UNIT/GM EX POWD
Freq: Three times a day (TID) | CUTANEOUS | Status: DC
  Administered 2022-10-27 – 2022-11-02 (×2): 1 via TOPICAL

## 2022-10-23 MED ORDER — APIXABAN 5 MG PO TABS
5.0000 mg | ORAL_TABLET | Freq: Two times a day (BID) | ORAL | Status: DC
  Administered 2022-10-23 – 2022-11-03 (×21): 5 mg via ORAL
  Filled 2022-10-23 (×21): qty 1

## 2022-10-23 MED ORDER — PREDNISONE 20 MG PO TABS
40.0000 mg | ORAL_TABLET | Freq: Every day | ORAL | Status: DC
Start: 2022-10-24 — End: 2022-10-23

## 2022-10-23 MED ORDER — UMECLIDINIUM-VILANTEROL 62.5-25 MCG/ACT IN AEPB
1.0000 | INHALATION_SPRAY | Freq: Every day | RESPIRATORY_TRACT | Status: DC
Start: 2022-10-23 — End: 2022-10-23

## 2022-10-23 MED ORDER — INSULIN ASPART 100 UNIT/ML IJ SOLN
10.0000 [IU] | Freq: Once | INTRAMUSCULAR | Status: AC
  Administered 2022-10-23: 10 [IU] via INTRAVENOUS

## 2022-10-23 MED ORDER — NYSTATIN 100000 UNIT/GM EX POWD: 1.0000 | Freq: Three times a day (TID) | CUTANEOUS | 0 refills | Status: AC

## 2022-10-23 MED ORDER — LACTATED RINGERS IV BOLUS
500.0000 mL | Freq: Once | INTRAVENOUS | Status: AC
  Administered 2022-10-23: 500 mL via INTRAVENOUS

## 2022-10-23 NOTE — Assessment & Plan Note (Addendum)
Markedly elevated glucose to 673 on admission, down to 377 with administration of 10 units of NovoLog in the ED.  Home regimen includes Trulicity and metformin.  Chart review reveals relatively good glucose control over the past few years with most recent A1c 8 months ago 6.3%.  Query whether his marked glucose elevation may be in the setting of acute illness.  Expect improvement with treatment of his underlying illness. -A1c--if markedly elevated, will add long-acting insulin - CBGs and mSSI - Continue home Lipitor

## 2022-10-23 NOTE — Assessment & Plan Note (Addendum)
Intertrigo of the groin. Candidal in appearance. Likely exacerbated by increased UOP in the setting of hyperglycemia. - Nystatin powder TID

## 2022-10-23 NOTE — ED Notes (Signed)
ED TO INPATIENT HANDOFF REPORT  ED Nurse Name and Phone #: Morrie Sheldon RN [phone]  S Name/Age/Gender Allen Gould 57 y.o. male Room/Bed: 029C/029C  Code Status   Code Status: DNR  Home/SNF/Other Home Patient oriented to: self, place, time, and situation Is this baseline? Yes   Triage Complete: Triage complete  Chief Complaint COPD (chronic obstructive pulmonary disease) (HCC) [J44.9]  Triage Note Pt present to ED with c/o shortness of breath times four days. Pt also c/o hyperglycemia, irritation to groin area. Pt A&Ox4 at this time.   Allergies Allergies  Allergen Reactions   Shellfish Allergy Hives    Level of Care/Admitting Diagnosis ED Disposition     ED Disposition  Admit   Condition  --   Comment  Hospital Area[facility] [[id number]]  Level of Care: Med-Surg [16]  May place patient in observation at [facility] or [facility] if equivalent level of care is available:: No  Covid Evaluation: Symptomatic Person Under Investigation (PUI) or recent exposure (last 10 days) *Testing Required*  Diagnosis: COPD (chronic obstructive pulmonary disease) [facility]) [[id number]]  Admitting Physician: Alicia Amel [[id number]]  Attending Physician: Latrelle Dodrill [id number]          B Medical/Surgery History Past Medical History:  Diagnosis Date   Acquired dilation of ascending aorta and aortic root (HCC)    42mm by echo 09/2020 but normal on Chest CTA 05/2020   Acute kidney injury (HCC) 09/01/2019   Asthma    Balanitis 08/09/2014   Bifascicular block    Cough 08/09/2014   Diabetes mellitus without complication (HCC)    Dilated aortic root (HCC)    42mm on echo 09/2020   DM (diabetes mellitus) type 2, uncontrolled, without ketoacidosis 08/10/2013   Dyslipidemia 03/01/2014   Financial difficulties 08/09/2014   GERD (gastroesophageal reflux disease)    Health care maintenance 03/01/2014   HOCM (hypertrophic obstructive cardiomyopathy) (HCC)    noted  on cMRI but no SAM or LVOT gradient   Hypertension    Hypomagnesemia 05/29/2017   Insomnia 08/29/2013   Mitral stenosis    mean MVG   Morbid obesity (HCC) 08/29/2013   Near syncope 05/29/2017   Odontogenic infection of jaw 05/29/2017   PAC (premature atrial contraction)    PAF (paroxysmal atrial fibrillation) (HCC)    Pressure injury of skin 09/02/2019   PVCs (premature ventricular contractions)    Right shoulder pain 08/29/2013   Second degree AV block, Mobitz type I    Sepsis (HCC) 05/29/2017   Sleep apnea    uses cpap   Past Surgical History:  Procedure Laterality Date   APPENDECTOMY     MASS EXCISION N/A 08/19/2020   Procedure: EXCISION OF SCROTAL CYSTS;  Surgeon: Noel Christmas, MD;  [facility];  Service: Urology;  Laterality: N/A;  1 HR   PACEMAKER IMPLANT N/A 06/24/2020   Procedure: PACEMAKER IMPLANT;  Surgeon: Lanier Prude, MD;  Location: [facility] INVASIVE CV LAB;  Service: Cardiovascular;  Laterality: N/A;     A IV Location/Drains/Wounds Patient Lines/Drains/Airways Status     Active Line/Drains/Airways     Name Placement date Placement time Site Days   Peripheral IV 10/23/22 22 G 1" Posterior;Right Forearm 10/23/22  1357  Forearm  less than 1   Pressure Injury 09/01/19 Buttocks Stage 2 -  Partial thickness loss of dermis presenting as a shallow open injury with a red, pink wound bed without slough. 09/01/19  2101  -- 1148  Intake/Output Last 24 hours No intake or output data in the 24 hours ending 10/23/22 2216  Labs/Imaging Results for orders placed or performed during the hospital encounter of 10/23/22 (from the past 48 hour(s))  CBC with Differential/Platelet     Status: Abnormal   Collection Time: 10/23/22 12:43 PM  Result Value Ref Range   WBC 10.8 (H) 4.0 - 10.5 K/uL   RBC 4.17 (L) 4.22 - 5.81 MIL/uL   Hemoglobin 13.0 13.0 - 17.0 g/dL   HCT 40.9 81.1 - 91.4 %   MCV 95.9 80.0 - 100.0 fL   MCH 31.2 26.0 - 34.0 pg   MCHC  32.5 30.0 - 36.0 g/dL   RDW 78.2 95.6 - 21.3 %   Platelets 240 150 - 400 K/uL    Comment: REPEATED TO VERIFY   nRBC 0.0 0.0 - 0.2 %   Neutrophils Relative % 77 %   Neutro Abs 8.4 (H) 1.7 - 7.7 K/uL   Lymphocytes Relative 13 %   Lymphs Abs 1.4 0.7 - 4.0 K/uL   Monocytes Relative 7 %   Monocytes Absolute 0.7 0.1 - 1.0 K/uL   Eosinophils Relative 2 %   Eosinophils Absolute 0.2 0.0 - 0.5 K/uL   Basophils Relative 0 %   Basophils Absolute 0.0 0.0 - 0.1 K/uL   Immature Granulocytes 1 %   Abs Immature Granulocytes 0.11 (H) 0.00 - 0.07 K/uL    Comment: Performed at [facility], [address] [place], [place] [address]  Brain natriuretic peptide     Status: None   Collection Time: 10/23/22 12:43 PM  Result Value Ref Range   B Natriuretic Peptide 81.4 0.0 - 100.0 pg/mL    Comment: Performed at [facility], [address], [place], [place] [address]  Comprehensive metabolic panel     Status: Abnormal   Collection Time: 10/23/22 12:43 PM  Result Value Ref Range   Sodium 129 (L) 135 - 145 mmol/L   Potassium 4.9 3.5 - 5.1 mmol/L   Chloride 92 (L) 98 - 111 mmol/L   CO2 23 22 - 32 mmol/L   Glucose, Bld 673 (HH) 70 - 99 mg/dL    Comment: CRITICAL RESULT CALLED TO, READ BACK BY AND VERIFIED WITH C,BLANKS RN @1345  10/23/22 E,[place] Glucose reference range applies only to samples taken after fasting for at least 8 hours.    BUN 18 6 - 20 mg/dL   Creatinine, Ser 2.95 (H) 0.61 - 1.24 mg/dL   Calcium 9.7 8.9 - 28.4 mg/dL   Total Protein 7.5 6.5 - 8.1 g/dL   Albumin 3.4 (L) 3.5 - 5.0 g/dL   AST 21 15 - 41 U/L   ALT 21 0 - 44 U/L   Alkaline Phosphatase 110 38 - 126 U/L   Total Bilirubin 0.9 0.3 - 1.2 mg/dL   GFR, Estimated 59 (L) >60 mL/min    Comment: (NOTE) Calculated using the CKD-EPI Creatinine Equation (2021)    Anion gap 14 5 - 15    Comment: Performed at [facility], [address] [place], [place] [address]  Troponin I (High Sensitivity)     Status: Abnormal    Collection Time: 10/23/22 12:43 PM  Result Value Ref Range   Troponin I (High Sensitivity) 21 (H) <18 ng/L    Comment: (NOTE) Elevated high sensitivity troponin I (hsTnI) values and significant  changes across serial measurements may suggest ACS but many other  chronic and acute conditions are known to elevate  hsTnI results.  Refer to the "Links" section for chest pain algorithms and additional  guidance. Performed at [facility], [address] [place], [place] [address]   Urinalysis, w/ Reflex to Culture (Infection Suspected) -Urine, Clean Catch     Status: Abnormal   Collection Time: 10/23/22 12:46 PM  Result Value Ref Range   Specimen Source URINE, CLEAN CATCH    Color, Urine STRAW (A) YELLOW   APPearance CLEAR CLEAR   Specific Gravity, Urine 1.028 1.005 - 1.030   pH 6.0 5.0 - 8.0   Glucose, UA >=500 (A) NEGATIVE mg/dL   Hgb urine dipstick SMALL (A) NEGATIVE   Bilirubin Urine NEGATIVE NEGATIVE   Ketones, ur NEGATIVE NEGATIVE mg/dL   Protein, ur NEGATIVE NEGATIVE mg/dL   Nitrite NEGATIVE NEGATIVE   Leukocytes,Ua TRACE (A) NEGATIVE   RBC / HPF 0-5 0 - 5 RBC/hpf   WBC, UA 21-50 0 - 5 WBC/hpf    Comment:        Reflex urine culture not performed if WBC <=10, OR if Squamous epithelial cells >5. If Squamous epithelial cells >5 suggest recollection.    Bacteria, UA NONE SEEN NONE SEEN   Squamous Epithelial / HPF 0-5 0 - 5 /HPF    Comment: Performed at [facility], [address], [place], [place] [address]  Urine Culture     Status: None (Preliminary result)   Collection Time: 10/23/22 12:46 PM   Specimen: Urine, Random  Result Value Ref Range   Specimen Description URINE, RANDOM    Special Requests      URINE, CLEAN CATCH Performed at [facility], [address] [place], [place] [address]    Culture PENDING    Report Status PENDING   I-Stat venous blood gas, ED     Status: Abnormal   Collection Time: 10/23/22 12:52 PM  Result Value Ref Range    pH, Ven 7.337 7.25 - 7.43   pCO2, Ven 48.3 44 - 60 mmHg   pO2, Ven 26 (LL) 32 - 45 mmHg   Bicarbonate 25.9 20.0 - 28.0 mmol/L   TCO2 27 22 - 32 mmol/L   O2 Saturation 44 %   Acid-base deficit 1.0 0.0 - 2.0 mmol/L   Sodium 130 (L) 135 - 145 mmol/L   Potassium 4.9 3.5 - 5.1 mmol/L   Calcium, Ion 1.24 1.15 - 1.40 mmol/L   HCT 42.0 39.0 - 52.0 %   Hemoglobin 14.3 13.0 - 17.0 g/dL   Sample type VENOUS    Comment NOTIFIED PHYSICIAN   Troponin I (High Sensitivity)     Status: Abnormal   Collection Time: 10/23/22  2:41 PM  Result Value Ref Range   Troponin I (High Sensitivity) 21 (H) <18 ng/L    Comment: (NOTE) Elevated high sensitivity troponin I (hsTnI) values and significant  changes across serial measurements may suggest ACS but many other  chronic and acute conditions are known to elevate hsTnI results.  Refer to the "Links" section for chest pain algorithms and additional  guidance. Performed at [facility], [address], [place], [place] [address]   CBG monitoring, ED     Status: Abnormal   Collection Time: 10/23/22  3:34 PM  Result Value Ref Range   Glucose-Capillary 377 (H) 70 - 99 mg/dL    Comment: Glucose reference range applies only to samples taken after fasting for at least 8 hours.   Comment 1 Document in Chart   CBG monitoring, ED  Status: Abnormal   Collection Time: 10/23/22 10:02 PM  Result Value Ref Range   Glucose-Capillary 401 (H) 70 - 99 mg/dL    Comment: Glucose reference range applies only to samples taken after fasting for at least 8 hours.   DG Chest Port 1 View  Result Date: 10/23/2022 CLINICAL DATA:  Short of breath. EXAM: PORTABLE CHEST 1 VIEW COMPARISON:  01/28/2022. FINDINGS: Cardiac silhouette normal in size and configuration. Normal mediastinal and hilar contours. Stable left anterior chest wall dual lead pacemaker. Clear lungs.  No pleural effusion or pneumothorax. Skeletal structures are grossly intact. IMPRESSION: No active disease.  Electronically Signed   By: Amie Portland M.D.   On: 10/23/2022 12:30    Pending Labs Unresulted Labs (From admission, onward)     Start     Ordered   10/24/22 0500  Basic metabolic panel  Tomorrow morning,   R        10/23/22 2157   10/24/22 0500  CBC  Tomorrow morning,   R        10/23/22 2157   10/23/22 2148  Hemoglobin A1c  (Glycemic Control (SSI)  Q 4 Hours / Glycemic Control (SSI)  AC +/- HS)  Once,   R       Comments: To assess prior glycemic control    10/23/22 2157   10/23/22 2144  SARS Coronavirus 2 by RT PCR (hospital order, performed in [facility] hospital lab) *cepheid single result test* Anterior Nasal Swab  (Tier 2 - SARS Coronavirus 2 by RT PCR (hospital order, performed in [facility] hospital lab) *cepheid single result test*)  Once,   R        10/23/22 2157   10/23/22 2143  HIV Antibody (routine testing w rflx)  (HIV Antibody (Routine testing w reflex) panel)  Once,   R        10/23/22 2157            Vitals/Pain Today's Vitals   10/23/22 1709 10/23/22 1930 10/23/22 2054 10/23/22 2130  BP: 123/89 139/83  (!) 123/95  Pulse: 94 97  92  Resp: 18 20  12   Temp: 98.4 F (36.9 C)  97.6 F (36.4 C)   TempSrc: Oral  Oral   SpO2: 97% 97%  100%  Weight:      Height:      PainSc:        Isolation Precautions No active isolations  Medications Medications  apixaban (ELIQUIS) tablet 5 mg (has no administration in time range)  umeclidinium-vilanterol (ANORO ELLIPTA) 62.5-25 MCG/ACT 1 puff (has no administration in time range)  cefTRIAXone (ROCEPHIN) 1 g in sodium chloride 0.9 % 100 mL IVPB (has no administration in time range)  predniSONE (DELTASONE) tablet 40 mg (has no administration in time range)  insulin glargine-yfgn (SEMGLEE) injection 10 Units (has no administration in time range)  insulin aspart (novoLOG) injection 0-15 Units (has no administration in time range)  atorvastatin (LIPITOR) tablet 80 mg (has no administration in time range)  irbesartan  (AVAPRO) tablet 75 mg (has no administration in time range)  furosemide (LASIX) tablet 40 mg (has no administration in time range)  ipratropium-albuterol (DUONEB) 0.5-2.5 (3) MG/3ML nebulizer solution 3 mL (has no administration in time range)  lactated ringers bolus 500 mL (0 mLs Intravenous Stopped 10/23/22 1844)  insulin aspart (novoLOG) injection 10 Units (10 Units Intravenous Given 10/23/22 1440)  nystatin (MYCOSTATIN/NYSTOP) topical powder ( Topical Given 10/23/22 1948)  ipratropium-albuterol (DUONEB) 0.5-2.5 (3) MG/3ML nebulizer solution 3  mL (3 mLs Nebulization Given 10/23/22 1932)  magnesium sulfate IVPB 2 g 50 mL (0 g Intravenous Stopped 10/23/22 2054)  ipratropium-albuterol (DUONEB) 0.5-2.5 (3) MG/3ML nebulizer solution 3 mL (3 mLs Nebulization Given 10/23/22 2059)    Mobility walks with person assist and device     Focused Assessments Pulmonary Assessment Handoff:  Lung sounds:   O2 Device: Room Air      R Recommendations: See Admitting Provider Note  Report given to:   Additional Notes:

## 2022-10-23 NOTE — H&P (Signed)
Hospital Admission History and Physical Service Pager: [phone]  Patient name: Allen Gould Medical record number: [id number] Date of Birth: 1965-07-23 Age: 57 y.o. Gender: male  Primary Care Provider: Sharmon Revere, MD Consultants: None Code Status: DNR  Preferred Emergency Contact:  Contact Information     Name Relation Home Work Mobile   smith,charlette Friend [phone]          Chief Complaint: Shortness of Breath  Assessment and Plan: Allen Gould is a 57 y.o. male presenting with 4 days of shortness of breath, cough with sputum production.  Also with sporadic fever. Differential for presentation of this includes community-acquired pneumonia versus new diagnosis COPD versus diastolic heart failure. CAP seems most likely at this time given SOB/Cough/Fever with R basilar crackles and sputum production with elevated WBC, though no focal findings on CXR.  No prior diagnosis of COPD per chart review but presented with significant wheeze responsive to Duonebs.  He is certainly at risk given smoking history and exam features c/w COPD: Increased AP diameter of chest, distant heart sounds. BNP is WNL at 81.4 suggesting against CHF, though this may be artificially suppressed in the setting of his obesity. Will admit for management of presumed COPD exacerbation.   Active Hospital Problems   *Shortness of breath           CAP seems most likely etiology at this time.            Though COPD and CHF exacerbations remain on the           differentials. Reassuringly had PFTs in Jan 2023           with FEV1/FVC 99% of predicted suggesting against           COPD.  Thankfully remains stable on room air at           this time.  WBC only modestly elevated to 10.8.           - Admit to med-surg           - On room air for now, supplement O2 as needed           - CTX and Azithromycin           - Duonebs q4h PRN           - Smoking cessation is a must    Scrotal swelling            Fairly impressive scrotal edema on exam.  More           concerning is how tender it is to palpation in his           questionable history of discharge from the           scrotum.  Will need to rule out a scrotal abscess.           If ultrasound is significant for communicating           hydrocele, would have greater concern for HFpEF as           a contributing factor to his presentation.  Of           note, does have a history of scrotal cyst excision           in 2022.           -Ultrasound scrotum    (HFpEF) heart failure with preserved ejection fraction (HCC)  Chronic           Difficult to discern if/how much of a contribution           this is making to his presentation at this time.            Dry weight is not known.  No evidence of gross           volume overload on exam or chest imaging.  BNP           within normal limits, however may be falsely           suppressed in the setting of his obesity.  Is not           on an SGLT2 inhibitor but would not be a good           candidate for this given his intertrigo.  Ongoing           cocaine use, last use about a week ago.-Continue           his home Lasix 40 mg, has only been taking as           needed at home, we will give daily while here,           monitor kidney function           -Daily weights and I's and O's           -Cocaine cessation a must    Urinary urgency           Seems most likely related to osmotic diuresis in           the setting of marked hyperglycemia.  However,           does have a recent history of acute cystitis about           9 months ago with a faintly positive UA today.            Control of his urinary symptoms will ultimately be           key in controlling his concomitant intertrigo.           -Diabetic control as above           -Any potential urinary infection should be covered           by his antibiotics for            CAP coverage               Type II diabetes mellitus  (HCC)           Markedly elevated glucose to 673 on admission,           down to 377 with administration of 10 units of           NovoLog in the ED.  Home regimen includes           Trulicity and metformin.  Chart review reveals           relatively good glucose control over the past few           years with most recent A1c 8 months ago 6.3%.            Query whether his marked glucose elevation may be           in the setting of acute illness.  Expect           improvement  with treatment of his underlying           illness.-A1c--if markedly elevated, will add           long-acting insulin           - CBGs and mSSI           - Continue home Lipitor               Intertrigo           Intertrigo of the groin. Candidal in appearance.           Likely exacerbated by increased UOP in the setting           of hyperglycemia.           - Nystatin powder TID    Tachycardia-bradycardia syndrome (HCC)        Chronic           Status post pacemaker in 2022.  Ventricular pacing           on monitor.  ECG with wide complexes and ST           elevations in the inferior and lateral leads, but           on review of prior EKGs this is stable.           -Continue home metoprolol succinate 25 mg daily    PAF (paroxysmal atrial fibrillation) (HCC)           Ventricular pacing on monitor.  No evidence of           A-fib at present.  On Eliquis and metoprolol at           home.           -Continue home Eliquis and metoprolol    Stage 3a chronic kidney disease (CKD) (HCC)           Creatinine is not far from baseline.  1.38 today,           the last record that we have was 1.23 about 9           months ago.  No evidence of proteinuria on UA.           -Continue his home ARB           -Caution with nephrotoxic meds    Housing instability after recent homelessness           Recently homeless.  Now staying with his sister.           - TOC for housing resources   Resolved Hospital Problems No  resolved problems to display.  Stable and chronic conditions Depression-continue home Cymbalta Hypertension-continue home ARB, amlodipine, metoprolol   FEN/GI: Carb-modified VTE Prophylaxis: On Eliquis  Disposition: Med-Surg  History of Present Illness:  Allen Gould is a 57 y.o. male presenting with shortness of breath, cough, intermittent fevers x 4 days.  Patient tells me that he started feeling poorly about 4 days ago, noticing that he was short of breath, worst with even minimal exertion.  Also with a cough with increased green sputum production.  Also thinks that he has had some fevers but has not documented any.  Notes that he has been more or less sedentary since he became ill.  Has been sleeping on the sofa.  Patient also describes increased urinary urgency and frequency without dysuria (though note that he did endorse dysuria to the ED provider).  Sleeping with a trash can next to the sofa so he does not have to get up to go to the bathroom.  Describes nearly filling the trash can with urine each night.  Also with marked polydipsia.  Denies any leg or abdominal swelling, but does have some scrotal swelling which she initially attributed to his "rash."  Though on further questioning he tells me that at some point there appeared to be a lump that was draining on his scrotum.  Denies any chest pain, tightness, squeezing.  He displays good adherence to all of his medications.  Does not check his blood glucose at home.  Recently homeless but has been staying with his sister.  In the ED, patient was found to be very wheezy and with marked hyperglycemia, glucose 673.  Thankfully, blood gas without evidence of acidosis and ketones on his UA.  UA was faintly positive with some pyuria and trace leukocytes but no nitrites or bacteria.  Urine culture was ordered by the ED provider.  Chest x-ray obtained by ED provider without evidence of active cardiopulmonary process.  ECG obtained in the  ED with ventricular paced rhythm and ST changes stable from prior ECGs.  Troponin flat at 21.  Patient received IV magnesium and DuoNebs for his wheezing questionable COPD presentation with only modest improvement in his wheezing.  Family medicine team was consulted for admission for COPD versus CHF versus CAP.   Review Of Systems: Per HPI with the following additions:  Review of Systems  Constitutional:  Positive for fever.  Respiratory:  Positive for cough, sputum production, shortness of breath and wheezing.   Cardiovascular:  Negative for chest pain, palpitations and leg swelling.  Gastrointestinal:  Negative for abdominal pain.  Genitourinary:  Positive for frequency and urgency. Negative for dysuria.  Skin:  Positive for rash.  All other systems reviewed and are negative.    Pertinent Past Medical History: Tachycardia-bradycardia syndrome status post pacemaker Type 2 diabetes Depression CKD 3A Remainder reviewed in history tab.   Pertinent Past Surgical History: Pacemaker implant 2022 Excision of scrotal cyst 2022 Remainder reviewed in history tab.   Pertinent Social History: Tobacco use: Yes- 3-4 cigarettes/day  Alcohol use: No Other Substance use: Cocaine, intermittent use. Last 1 week ago. Lives with Sister. Recently homeless now staying on a sofa at his sister's home.   Pertinent Family History: Unrevealing  Remainder reviewed in history tab.   Important Outpatient Medications: Eliquis 5mg  BID Remainder reviewed in medication history.   Objective: BP (!) 123/95   Pulse 92   Temp 98.3 F (36.8 C) (Oral)   Resp 12   Ht 6' (1.829 m)   Wt 134 kg   SpO2 100%   BMI 40.07 kg/m  Exam: General: Lying in ED stretcher, engaged and NAD Eyes: Sclerae anicteric, EOM's intact ENTM: Mucous membranes moist, has had multiple teeth pulled, remaining dentition in good condition.  Oropharynx clear. Neck: Supple and without lymphadenopathy. Cardiovascular: Distant heart  sounds, challenging exam, though does seem to be a midsystolic murmur heard best at the left lower sternal border Respiratory: Speaking in full sentences, intermittently tachypneic on room air, crackles to the right lung base.  No wheezes to my exam, though patient had just finished receiving a DuoNeb treatment. Gastrointestinal: Obese, nontender MSK: Without lower extremity edema. Derm: Macerated skin with nystatin powder in place to groin.  No drainage noted. GU: Markedly swollen scrotum, about the size of a grapefruit.  Quite tender to palpation so difficult to examine.  Right testicle is normal, I was not able to examine the left testicle due to pain. Psych: Normal mood and affect  Labs:  CBC BMET  Recent Labs  Lab 10/23/22 1243 10/23/22 1252  WBC 10.8*  --   HGB 13.0 14.3  HCT 40.0 42.0  PLT 240  --    Recent Labs  Lab 10/23/22 1243 10/23/22 1252  NA 129* 130*  K 4.9 4.9  CL 92*  --   CO2 23  --   BUN 18  --   CREATININE 1.38*  --   GLUCOSE 673*  --   CALCIUM 9.7  --     Pertinent additional labs BNP 81.4, troponin 21> 21.  EKG:  Ventricular pacing, wide-complex QRS with ST elevation in the inferior and lateral leads stable from prior ECGs   Imaging Studies Performed:  CXR IMPRESSION: No active disease.  Independently reviewed and agree with radiologist interpretation.  No cardiomegaly or evidence of consolidation.  Alicia Amel, MD 10/23/2022, 11:16 PM PGY-2, [facility]  FPTS Intern pager: [phone], text pages welcome Secure chat group [facility] Teaching Service

## 2022-10-23 NOTE — Assessment & Plan Note (Deleted)
By report was quite wheezy on admission, responded quite well to duonebs.  No diagnosis of COPD previously but at risk given smoking history and exam features c/w COPD: Increased AP diameter of chest, distant heart sounds.

## 2022-10-23 NOTE — Assessment & Plan Note (Addendum)
Creatinine is not far from baseline.  1.38 today, the last record that we have was 1.23 about 9 months ago.  No evidence of proteinuria on UA. -Continue his home ARB -Caution with nephrotoxic meds

## 2022-10-23 NOTE — Assessment & Plan Note (Signed)
Ventricular pacing on monitor.  No evidence of A-fib at present.  On Eliquis and metoprolol at home. -Continue home Eliquis and metoprolol

## 2022-10-23 NOTE — Assessment & Plan Note (Addendum)
Difficult to discern if/how much of a contribution this is making to his presentation at this time.  Dry weight is not known.  No evidence of gross volume overload on exam or chest imaging.  BNP within normal limits, however may be falsely suppressed in the setting of his obesity.  Is not on an SGLT2 inhibitor but would not be a good candidate for this given his intertrigo.  Ongoing cocaine use, last use about a week ago. -Continue his home Lasix 40 mg, has only been taking as needed at home, we will give daily while here, monitor kidney function -Daily weights and I's and O's -Cocaine cessation a must

## 2022-10-23 NOTE — Assessment & Plan Note (Addendum)
Fairly impressive scrotal edema on exam.  More concerning is how tender it is to palpation in his questionable history of discharge from the scrotum.  Will need to rule out a scrotal abscess.  If ultrasound is significant for communicating hydrocele, would have greater concern for HFpEF as a contributing factor to his presentation.  Of note, does have a history of scrotal cyst excision in 2022. -Ultrasound scrotum

## 2022-10-23 NOTE — ED Provider Notes (Addendum)
57 yo male presenting with multiple complaints, found to have hyperglycemia, BS > 600 with no anion gap or ketones in urine, BS treated to 377 with insulin and fluids. No acidosis.  Pt on eliquis for parox a fib Trop 21 -> 21, flat, bnp wnl. Hgb wnl.  Pending ambulation for pulse ox  Physical Exam  BP 139/83   Pulse 97   Temp 97.6 F (36.4 C) (Oral)   Resp 20   Ht 6' (1.829 m)   Wt 134 kg   SpO2 97%   BMI 40.07 kg/m   Physical Exam  Procedures  Procedures  ED Course / MDM   Clinical Course as of 10/23/22 2103  Sat Oct 23, 2022  1924 Patient is reassessed continues to have labored breathing.  He has audible expiratory wheezing on exam.  I will give him DuoNebs as well as IV magnesium.  Attempting to avoid steroids at the moment given his hyperglycemia but will discuss with the hospitalist.  I am anticipating medical admission for COPD exacerbation. [MT]  2050 No significant improvement after breathing treatment.  We will plan for admission [MT]  2102 Admitted to family medicine [MT]    Clinical Course User Index [MT] Sopheap Boehle, Kermit Balo, MD   Medical Decision Making Amount and/or Complexity of Data Reviewed Labs: ordered. Radiology: ordered.  Risk Prescription drug management.         Terald Sleeper, MD 10/23/22 2052    Terald Sleeper, MD 10/23/22 2103

## 2022-10-23 NOTE — ED Triage Notes (Signed)
Pt present to ED with c/o shortness of breath times four days. Pt also c/o hyperglycemia, irritation to groin area. Pt A&Ox4 at this time.

## 2022-10-23 NOTE — ED Provider Notes (Signed)
[place] EMERGENCY DEPARTMENT AT [facility] Provider Note   CSN: [id number] Arrival date & time: 10/23/22  1203     History  Chief Complaint  Patient presents with   Shortness of Breath    Allen Gould is a 57 y.o. male.  Patient is a 57 year old male with a history of paroxysmal atrial fibrillation on Eliquis, diabetes, CKD, heart failure with preserved EF on Lasix daily, ongoing tobacco use, asthma and tachybradycardia syndrome status post pacemaker who is presenting today with a 4-day history of worsening shortness of breath.  Patient reports over the last 4 days he has had exertional dyspnea that is getting worse as well as a cough with green sputum production.  He denies any chest pain but also notes that in his groin area he is having lower abdominal and groin pain.  There is a rash in his groin and he is having dysuria.  He has been but compliant with his medications but thinks the pharmacy is packaging them around.  He is supposed to take Lasix every other day but thinks he has been taking it twice a day.  He does not check his weight regularly has not noticed any specific swelling in his feet.  Bowel movements have been normal.  He is having polyuria and polydipsia and has also had some nausea but denies any vomiting or diarrhea.  He has felt hot but has not checked his temperature.  The history is provided by the patient.  Shortness of Breath      Home Medications Prior to Admission medications   Medication Sig Start Date End Date Taking? Authorizing Provider  nystatin (MYCOSTATIN/NYSTOP) powder Apply 1 Application topically 3 (three) times daily. 10/23/22  Yes Gwyneth Sprout, MD  Accu-Chek Softclix Lancets lancets Check blood sugar 3 times a day 06/11/20   Quincy Simmonds, MD  amLODipine (NORVASC) 10 MG tablet Take 1 tablet (10 mg total) by mouth daily. 10/06/21 10/01/22  Elige Radon, MD  apixaban (ELIQUIS) 5 MG TABS tablet Take 1 tablet (5 mg total) by  mouth 2 (two) times daily. 01/30/21   Quintella Reichert, MD  atorvastatin (LIPITOR) 80 MG tablet TAKE 1 TABLET (80 MG TOTAL) BY MOUTH DAILY. 07/01/21   Elige Radon, MD  cyclopentolate (CYCLODRYL,CYCLOGYL) 1 % ophthalmic solution Place 1 drop into the left eye 2 (two) times daily. 01/30/22   Mapp, Gaylyn Cheers, MD  diclofenac Sodium (VOLTAREN) 1 % GEL Apply 4 g topically 4 (four) times daily -  before meals and at bedtime. 01/30/22   Mapp, Gaylyn Cheers, MD  DULoxetine (CYMBALTA) 60 MG capsule Take 1 capsule (60 mg total) by mouth daily. 10/06/21 10/06/22  Elige Radon, MD  furosemide (LASIX) 40 MG tablet Take 1 tablet (40 mg total) by mouth every other day. Patient taking differently: Take 40 mg by mouth daily as needed for fluid. 10/06/21   Elige Radon, MD  gabapentin (NEURONTIN) 300 MG capsule TAKE 1 CAPSULE (300 MG TOTAL) BY MOUTH IN THE MORNING, AT NOON, AND AT BEDTIME. 09/03/21 03/02/22  [name], [name], MD  glucose blood (ACCU-CHEK GUIDE) test strip Check blood sugar 3 times a day 06/11/20   Quincy Simmonds, MD  ibuprofen (ADVIL) 200 MG tablet Take 600 mg by mouth 2 (two) times daily as needed (pain).    [provider]  metFORMIN (GLUCOPHAGE) 500 MG tablet Take 1 tablet (500 mg total) by mouth 2 (two) times daily with a meal. 10/06/21   Ephriam Knuckles, [name], MD  metoprolol succinate (TOPROL XL)  25 MG 24 hr tablet Take 1 tablet (25 mg total) by mouth at bedtime. 10/06/21   [name], [name], MD  pantoprazole (PROTONIX) 40 MG tablet TAKE 1 TABLET (40 MG TOTAL) BY MOUTH DAILY. 09/03/21 09/03/22  Elige Radon, MD  prednisoLONE acetate (PRED FORTE) 1 % ophthalmic suspension Place 1 drop into the left eye 4 (four) times daily -  before meals and at bedtime. 01/30/22   Mapp, Gaylyn Cheers, MD  traMADol (ULTRAM) 50 MG tablet Take 1-2 tablets (50-100 mg total) by mouth daily as needed. 04/20/22   Tarry Kos, MD  TRULICITY 0.75 MG/0.5ML SOPN INJECT 0.75 MG INTO THE SKIN ONCE A WEEK. 10/06/21   Elige Radon, MD   valsartan (DIOVAN) 80 MG tablet Take 1 tablet (80 mg total) by mouth daily. 10/06/21   Elige Radon, MD  buPROPion (WELLBUTRIN XL) 150 MG 24 hr tablet Take 1 tablet (150 mg total) by mouth every morning. 06/04/21 06/13/21  Elige Radon, MD      Allergies    Shellfish allergy    Review of Systems   Review of Systems  Respiratory:  Positive for shortness of breath.     Physical Exam Updated Vital Signs BP (!) 127/92   Pulse (!) 41   Temp 98.2 F (36.8 C) (Oral)   Resp 15   Ht 6' (1.829 m)   Wt 134 kg   SpO2 100%   BMI 40.07 kg/m  Physical Exam Vitals and nursing note reviewed.  Constitutional:      General: He is not in acute distress.    Appearance: He is well-developed.  HENT:     Head: Normocephalic and atraumatic.  Eyes:     Conjunctiva/sclera: Conjunctivae normal.     Pupils: Pupils are equal, round, and reactive to light.  Cardiovascular:     Rate and Rhythm: Normal rate and regular rhythm.     Pulses: Normal pulses.     Heart sounds: No murmur heard. Pulmonary:     Effort: Pulmonary effort is normal. No respiratory distress.     Breath sounds: Normal breath sounds. No wheezing or rales.  Abdominal:     General: There is no distension.     Palpations: Abdomen is soft.     Tenderness: There is abdominal tenderness in the suprapubic area. There is no guarding or rebound.  Genitourinary:    Comments: Patient scrotum has macerated skin with some peeling of the skin, no induration, fluctuance or drainage.  Testicles are normal. Musculoskeletal:        General: No tenderness. Normal range of motion.     Cervical back: Normal range of motion and neck supple.     Right lower leg: No edema.     Left lower leg: No edema.  Skin:    General: Skin is warm and dry.     Findings: No erythema or rash.  Neurological:     Mental Status: He is alert and oriented to person, place, and time. Mental status is at baseline.     Sensory: No sensory deficit.     Motor: No  weakness.  Psychiatric:        Mood and Affect: Mood normal.        Behavior: Behavior normal.     ED Results / Procedures / Treatments   Labs (all labs ordered are listed, but only abnormal results are displayed) Labs Reviewed  CBC WITH DIFFERENTIAL/PLATELET - Abnormal; Notable for the following components:      Result Value  WBC 10.8 (*)    RBC 4.17 (*)    Neutro Abs 8.4 (*)    Abs Immature Granulocytes 0.11 (*)    All other components within normal limits  COMPREHENSIVE METABOLIC PANEL - Abnormal; Notable for the following components:   Sodium 129 (*)    Chloride 92 (*)    Glucose, Bld 673 (*)    Creatinine, Ser 1.38 (*)    Albumin 3.4 (*)    GFR, Estimated 59 (*)    All other components within normal limits  URINALYSIS, W/ REFLEX TO CULTURE (INFECTION SUSPECTED) - Abnormal; Notable for the following components:   Color, Urine STRAW (*)    Glucose, UA >=500 (*)    Hgb urine dipstick SMALL (*)    Leukocytes,Ua TRACE (*)    All other components within normal limits  I-STAT VENOUS BLOOD GAS, ED - Abnormal; Notable for the following components:   pO2, Ven 26 (*)    Sodium 130 (*)    All other components within normal limits  CBG MONITORING, ED - Abnormal; Notable for the following components:   Glucose-Capillary 377 (*)    All other components within normal limits  TROPONIN I (HIGH SENSITIVITY) - Abnormal; Notable for the following components:   Troponin I (High Sensitivity) 21 (*)    All other components within normal limits  TROPONIN I (HIGH SENSITIVITY) - Abnormal; Notable for the following components:   Troponin I (High Sensitivity) 21 (*)    All other components within normal limits  URINE CULTURE  BRAIN NATRIURETIC PEPTIDE    EKG EKG Interpretation  Date/Time:  Saturday October 23 2022 12:09:24 EDT Ventricular Rate:  88 PR Interval:  173 QRS Duration: 172 QT Interval:  420 QTC Calculation: 509 R Axis:   262 Text Interpretation: Electronic ventricular  pacemaker Atrial premature complex Nonspecific IVCD with LAD Inferior infarct, acute (LCx) Anterolateral infarct, old No significant change since last tracing Confirmed by Gwyneth Sprout ([id number]) on 10/23/2022 2:26:12 PM  Radiology DG Chest Port 1 View  Result Date: 10/23/2022 CLINICAL DATA:  Short of breath. EXAM: PORTABLE CHEST 1 VIEW COMPARISON:  01/28/2022. FINDINGS: Cardiac silhouette normal in size and configuration. Normal mediastinal and hilar contours. Stable left anterior chest wall dual lead pacemaker. Clear lungs.  No pleural effusion or pneumothorax. Skeletal structures are grossly intact. IMPRESSION: No active disease. Electronically Signed   By: Amie Portland M.D.   On: 10/23/2022 12:30    Procedures Procedures    Medications Ordered in ED Medications  nystatin (MYCOSTATIN/NYSTOP) topical powder (has no administration in time range)  lactated ringers bolus 500 mL (500 mLs Intravenous Bolus 10/23/22 1439)  insulin aspart (novoLOG) injection 10 Units (10 Units Intravenous Given 10/23/22 1440)    ED Course/ Medical Decision Making/ A&P                             Medical Decision Making Amount and/or Complexity of Data Reviewed External Data Reviewed: notes. Labs: ordered. Decision-making details documented in ED Course. Radiology: ordered and independent interpretation performed. Decision-making details documented in ED Course. ECG/medicine tests: ordered and independent interpretation performed. Decision-making details documented in ED Course.  Risk Prescription drug management.   Pt with multiple medical problems and comorbidities and presenting today with a complaint that caries a high risk for morbidity and mortality.  Here today with complaints of shortness of breath.  Concern for infectious symptoms such as pneumonia, bronchitis or viral etiology, lower suspicion  for PE as patient is on chronic anticoagulation for atrial fibrillation, pneumothorax.  Also concern for CHF  exacerbation versus atypical ACS.  Patient is also per EMS had a blood sugar of greater than 600 today and symptoms could be DKA although patient is not tachycardic, ill-appearing or smelling of ketones.  He is also complaining of dysuria and will check groin to ensure no evidence of Fournier's gangrene, abscesses but suspect most likely yeast.  UA also to ensure no evidence of UTI.  I independently interpreted patient's EKG which shows a paced rhythm at this time which is unchanged from prior.  No sepsis criteria met at this time.  4:48 PM I independently interpreted patient's labs.  Troponin is baseline at 21 and unchanged x 2, VBG with no evidence of DKA today with normal pH and CO2, UA without evidence of infection and no bacteria.  Glucose is greater than 500, CBC without acute findings, BNP normal today at 81 and CMP with hyponatremia of 129 in the setting of a sugar of 673, unchanged creatinine of 1.38 and normal anion gap.  I have independently visualized and interpreted pt's images today.  Chest x-ray today with no evidence of fluid retention.  Patient is able to lay flat in the bed and is having no trouble breathing with sats of 100%.  Low suspicion for CHF at this time.  Patient does have a yeast infection in his groin which he was given nystatin powder for.  Patient was given a small bolus of fluid and insulin with improvement of his blood sugar to 300.  At this time with reassuring vital signs, reassuring labs and improved blood sugar do not feel that patient requires admission.  Will ambulate patient to ensure he has no evidence of desats but think he otherwise could go home continuing and his inhalers and his home medications.  Did encourage him to go to the drugstore and get a new glucometer so he can follow his blood sugars.          Final Clinical Impression(s) / ED Diagnoses Final diagnoses:  Hyperglycemia  Candida rash of groin    Rx / DC Orders ED Discharge Orders           Ordered    nystatin (MYCOSTATIN/NYSTOP) powder  3 times daily        10/23/22 1551              Gwyneth Sprout, MD 10/23/22 1648

## 2022-10-23 NOTE — ED Notes (Signed)
Pt noted 100% room air during trial ambulation with rolling walker.  Pt c/o being lightheaded and short of breath and could only ambulate to room door. Voiced to pt to return back to bed, pt continue to c/o shortness of breath, oxygen saturation remains 100% room air.

## 2022-10-23 NOTE — Assessment & Plan Note (Addendum)
CAP seems most likely etiology at this time.  Though COPD and CHF exacerbations remain on the differentials. Reassuringly had PFTs in Jan 2023 with FEV1/FVC 99% of predicted suggesting against COPD.  Thankfully remains stable on room air at this time.  WBC only modestly elevated to 10.8. - Admit to med-surg - On room air for now, supplement O2 as needed - CTX and Azithromycin - Duonebs q4h PRN - Smoking cessation is a must

## 2022-10-23 NOTE — Assessment & Plan Note (Addendum)
Seems most likely related to osmotic diuresis in the setting of marked hyperglycemia.  However, does have a recent history of acute cystitis about 9 months ago with a faintly positive UA today.  Control of his urinary symptoms will ultimately be key in controlling his concomitant intertrigo. -Diabetic control as above -Any potential urinary infection should be covered by his antibiotics for CAP coverage

## 2022-10-23 NOTE — Discharge Instructions (Addendum)
        On behalf of the [facility] team, it was a pleasure caring for you. Below are outlined some additional information and resources for when you are discharged from SNF. Like we had discussed, it will be important to set up care with a Psychiatrist and therapist that can support you and manage your psychiatric medications.   -Recommend abstinence from alcohol, tobacco, and other illicit drug use at discharge.  -If your psychiatric symptoms recur, worsen, or if you have side effects to your psychiatric medications, call your outpatient psychiatric provider, 911, [phone] or go to the nearest emergency department. -If suicidal thoughts recur, call your outpatient psychiatric provider, 911, [phone] or go to the nearest emergency department.  [facility] will provide timely access to mental health services for children and adolescents (age 38 - 42) and adults presenting in a mental health crisis. The program is designed for those who need urgent behavioral health or substance use treatment and are not experiencing a medical crisis that would typically require an emergency room visit.  We also offer the following outpatient services: Individual Therapy Partial Hospitalization Program (PHP) Substance Abuse Intensive Outpatient Program [facility]) Specialized Intensive Adult Group Therapy Medication Management Peer Living Room  Phone: [phone] Address: [address] [place], [place] [address]  Hours: Open 24/7, No appointment required.

## 2022-10-23 NOTE — Assessment & Plan Note (Addendum)
Status post pacemaker in 2022.  Ventricular pacing on monitor.  ECG with wide complexes and ST elevations in the inferior and lateral leads, but on review of prior EKGs this is stable. -Continue home metoprolol succinate 25 mg daily

## 2022-10-23 NOTE — Hospital Course (Addendum)
Allen Gould is a 57 y.o.male with a history of Tachy-Brady syndrome s/p pacemaker, HFpEF, A Fib on Eliquis, T2DM, CKD 3a  who was admitted to the [facility] Medicine Teaching Service at [facility] for dyspnea and hyperglycemia. His hospital course is detailed below:  Shortness of breath Presented with shortness of breath, productive cough, subjective fevers also wheezing.  Considered to be multifactorial in the setting of asthma versus possible community-acquired pneumonia.  Patient was started on course of antibiotics (ceftriaxone and azithromycin) and prednisone.  Patient also received scheduled DuoNebs.  Patient's respiratory status improved overall and he responded well to duonebs. Upon discharge, patient was started on maintenance ICS fluticasone inhaler Arnuity 100 1 puff daily.  Hyperglycemia / T2DM Patient noted to be hyperglycemic during his stay, in the setting of uncontrolled T2DM as well as concurrent steroid therapy. A1c >15. Patient briefly required Endotool insulin IV infusion. Was transitioned off of this after <1 day. His insulin regimen was adjusted, and upon discharge was sent to SNF with 30u long-acting plus 10u TID with meals.  Scrotal swelling / intertrigo Scrotal edema noted on exam upon admission. U/S showed large L hydrocele and bilateral epididymal head cyst, no abscess. Pt also received nystatin powder for intertrigo of groin. Likely 2/2 hyperglycemia/glucosuria. Recommend outpatient urology f/u.  Severe depression Patient noted to have worsening depression. His duloxetine dose was increased to 120mg  daily. Patient did endorse some passive SI. Psychiatry was consulted and recommended continued duloxetine and outpatient therapy f/u.  Other chronic conditions were medically managed with home medications and formulary alternatives as necessary (HFpEF, tachycardia-bradycardia syndrome, PAF, CKD 3a)  PCP Follow-up Recommendations: Recommend referral for therapy - evaluated by  psychiatry during this admission for worsening depression Recommend urology referral for hydrocele Started on maintenance ICS for asthma, as well as insulin for diabetes. Please ensure compliance  Consider repeat PFTs Recommend smoking cessation Anemia noted during his admission. Recommend outpatient f/u, consider colonoscopy and/or iron studies

## 2022-10-23 NOTE — Assessment & Plan Note (Addendum)
Recently homeless.  Now staying with his sister. - TOC for housing resources

## 2022-10-24 DIAGNOSIS — R739 Hyperglycemia, unspecified: Secondary | ICD-10-CM | POA: Diagnosis not present

## 2022-10-24 DIAGNOSIS — F1910 Other psychoactive substance abuse, uncomplicated: Secondary | ICD-10-CM | POA: Insufficient documentation

## 2022-10-24 LAB — CBC
HCT: 35.7 % — ABNORMAL LOW (ref 39.0–52.0)
Hemoglobin: 12 g/dL — ABNORMAL LOW (ref 13.0–17.0)
MCH: 32 pg (ref 26.0–34.0)
MCHC: 33.6 g/dL (ref 30.0–36.0)
MCV: 95.2 fL (ref 80.0–100.0)
Platelets: 215 10*3/uL (ref 150–400)
RBC: 3.75 MIL/uL — ABNORMAL LOW (ref 4.22–5.81)
RDW: 11.9 % (ref 11.5–15.5)
WBC: 12 10*3/uL — ABNORMAL HIGH (ref 4.0–10.5)
nRBC: 0 % (ref 0.0–0.2)

## 2022-10-24 LAB — URINE CULTURE

## 2022-10-24 LAB — BASIC METABOLIC PANEL
Anion gap: 13 (ref 5–15)
BUN: 17 mg/dL (ref 6–20)
CO2: 21 mmol/L — ABNORMAL LOW (ref 22–32)
Calcium: 9.3 mg/dL (ref 8.9–10.3)
Chloride: 97 mmol/L — ABNORMAL LOW (ref 98–111)
Creatinine, Ser: 1.31 mg/dL — ABNORMAL HIGH (ref 0.61–1.24)
GFR, Estimated: >60 mL/min (ref >60)
Glucose, Bld: 450 mg/dL — ABNORMAL HIGH (ref 70–99)
Potassium: 3.8 mmol/L (ref 3.5–5.1)
Sodium: 131 mmol/L — ABNORMAL LOW (ref 135–145)

## 2022-10-24 LAB — GLUCOSE, CAPILLARY
Glucose-Capillary: 388 mg/dL — ABNORMAL HIGH (ref 70–99)
Glucose-Capillary: 373 mg/dL — ABNORMAL HIGH (ref 70–99)
Glucose-Capillary: 383 mg/dL — ABNORMAL HIGH (ref 70–99)
Glucose-Capillary: 437 mg/dL — ABNORMAL HIGH (ref 70–99)

## 2022-10-24 MED ORDER — PREDNISONE 20 MG PO TABS
40.0000 mg | ORAL_TABLET | Freq: Every day | ORAL | Status: AC
  Administered 2022-10-24 – 2022-10-28 (×5): 40 mg via ORAL
  Filled 2022-10-24 (×5): qty 2

## 2022-10-24 MED ORDER — INSULIN GLARGINE-YFGN 100 UNIT/ML ~~LOC~~ SOLN
5.0000 [IU] | Freq: Every day | SUBCUTANEOUS | Status: DC
  Administered 2022-10-24: 5 [IU] via SUBCUTANEOUS
  Filled 2022-10-24 (×2): qty 0.05

## 2022-10-24 MED ORDER — DULOXETINE HCL 60 MG PO CPEP
120.0000 mg | ORAL_CAPSULE | Freq: Every day | ORAL | Status: DC
  Administered 2022-10-25 – 2022-11-03 (×10): 120 mg via ORAL
  Filled 2022-10-24 (×10): qty 2

## 2022-10-24 MED ORDER — PANTOPRAZOLE SODIUM 40 MG PO TBEC
40.0000 mg | DELAYED_RELEASE_TABLET | Freq: Every day | ORAL | Status: DC
  Administered 2022-10-24 – 2022-11-03 (×11): 40 mg via ORAL
  Filled 2022-10-24 (×10): qty 1

## 2022-10-24 MED ORDER — INSULIN ASPART 100 UNIT/ML IJ SOLN: 0.0000 [IU] | Freq: Three times a day (TID) | INTRAMUSCULAR | Status: DC

## 2022-10-24 NOTE — Assessment & Plan Note (Addendum)
Does not appear to be in HF exacerbation. Dry weight is not known.  No evidence of gross volume overload on exam or chest imaging.  BNP within normal limits, however may be falsely suppressed in the setting of his obesity.  Ongoing cocaine use, last use about a week ago. -Continue his home Lasix 40 mg, -Daily weights and I's and O's -Cocaine cessation enocuraged

## 2022-10-24 NOTE — Assessment & Plan Note (Addendum)
Markedly elevated glucose to 673 on admission. CBG 388 this morning and patient was treated w/ 15 units SAI. Home regimen includes Trulicity and metformin.  Most recent A1c 6.3% 01/28/22.  Marked glucose elevation may be in the setting of acute illness. Will start LAI as we will be starting steroids today. - CBGs and mSSI - 5 units Semglee qhs - Continue home Lipitor

## 2022-10-24 NOTE — Assessment & Plan Note (Addendum)
CAP seems most likely etiology at this time.  Reassuringly had PFTs in Jan 2023 with FEV1/FVC 99% of predicted suggesting against COPD, euvolemic on exam, less concerning for CHF. Remains stable on room air at this time.  WBC elevated to 12.0 (10.8) this am, and wheezing noted on exam. Will initiate steroids given wheezing an hx of asthma. - Prednisone 40 mg daily - On room air for now, supplement O2 as needed - CTX and Azithromycin - Duonebs q4h PRN - Smoking cessation encouraged

## 2022-10-24 NOTE — Assessment & Plan Note (Signed)
Patient using cocaine regularly, when he can afford it. Report's last use 1 wk ago.  -TOC for substance use

## 2022-10-24 NOTE — Assessment & Plan Note (Signed)
Intertrigo of the groin. Candidal in appearance. Likely exacerbated by increased UOP in the setting of hyperglycemia. - Nystatin powder TID  

## 2022-10-24 NOTE — Progress Notes (Signed)
Daily Progress Note Intern Pager: [phone]  Patient name: Allen Gould Medical record number: [id number] Date of birth: [birth year] Age: 57 y.o. Gender: male  Primary Care Provider: Sharmon Revere, MD Consultants: None Code Status: DNR  Pt Overview and Major Events to Date:  6/1 - admitted  Assessment and Plan:  Allen Gould is a 57 yo male presenting wih SOB, cough and sputum production, thought to 2/2 to CAP. He has a PMH significant for Tachy-Brady syndrome s/p pacemaker, HFpEF, A Fib on Eliquis, T2DM, CKD 3a   Active Hospital Problems   *Shortness of breath           CAP seems most likely etiology at this time.            Reassuringly had PFTs in Jan 2023 with FEV1/FVC           99% of predicted suggesting against COPD,           euvolemic on exam, less concerning for CHF.           Remains stable on room air at this time.  WBC           elevated to 12.0 (10.8) this am, and wheezing           noted on exam. Will initiate steroids given           wheezing an hx of asthma.           - Prednisone 40 mg daily           - On room air for now, supplement O2 as needed           - CTX and Azithromycin           - Duonebs q4h PRN           - Smoking cessation encouraged    Scrotal swelling           Fairly impressive scrotal edema on admission.            Tender to palpation, and reports history of           discharge from the scrotum. Of note, does have a           history of scrotal cyst excision in 2022. Scrotal           US shows large left hydrocele and bilateral           epididymal head cyst, no signs of abscess.           Hydrocele stable this am, scrotum the size of           small orange.            -CTM    Type II diabetes mellitus (HCC)           Markedly elevated glucose to 673 on admission. CBG           388 this morning and patient was treated w/ 15           units SAI. Home regimen includes Trulicity and           metformin.  Most recent A1c 6.3%  01/28/22.  Marked           glucose elevation may be in the setting of acute           illness. Will start LAI as we will be starting  steroids today.           - CBGs and mSSI           - 5 units Semglee qhs           - Continue home Lipitor    Urinary urgency           Seems most likely related to osmotic diuresis in           the setting of marked hyperglycemia.  Has trace           leuks on UA. Low concern for UTI, but patient is           covered by CAP abx. Will treat hyperglycemia to           see if improves urinary symptoms.           -Diabetic control as above               Severe depression (HCC)        Chronic           Patient notes he's been feeling hopeless and           depressed lately, and notes some passive SI,           wishing things would end, but no plans to end life           or harm self. Does not want to speak with           chaplain, and feels like the medicine he's on is           not working, will increase dose.           -Increase duloxetine to 120 mg daily    Intertrigo           Intertrigo of the groin. Candidal in appearance.           Likely exacerbated by increased UOP in the setting           of hyperglycemia.           - Nystatin powder TID    (HFpEF) heart failure with preserved ejection fraction (HCC)        Chronic           Does not appear to be in HF exacerbation. Dry           weight is not known.  No evidence of gross volume           overload on exam or chest imaging.  BNP within           normal limits, however may be falsely suppressed           in the setting of his obesity.  Ongoing cocaine           use, last use about a week ago.           -Continue his home Lasix 40 mg,           -Daily weights and I's and O's           -Cocaine cessation enocuraged    Tachycardia-bradycardia syndrome (HCC)        Chronic           Status post pacemaker in 2022.  Ventricular pacing           on monitor. HR stable overnight.            -Continue  home metoprolol succinate 25 mg daily    PAF (paroxysmal atrial fibrillation) (HCC)           Ventricular pacing on monitor.  No evidence of           A-fib at present.  On Eliquis and metoprolol at           home.           -Continue home Eliquis and metoprolol    Stage 3a chronic kidney disease (CKD) (HCC)           Creatinine is 1.31 today, not far from baseline of           1.23.  No evidence of proteinuria on UA.           -Continue ARB           -Caution with nephrotoxic meds    Housing instability after recent homelessness           Recently homeless.  Now staying with his sister.           - TOC for housing resources    Substance abuse [facility])           Patient using cocaine regularly, when he can           afford it. Report's last use            1 wk ago.            -TOC for substance use   FEN/GI: Carb modified PPx: Eliquis Dispo:Home tomorrow.    Subjective:  Still feeling unwell today, notes no change in condition  Objective: Temp:  [97.6 F (36.4 C)-98.4 F (36.9 C)] 98.3 F (36.8 C) (06/02 0741) Pulse Rate:  [41-100] 78 (06/02 0741) Resp:  [12-20] 17 (06/02 0741) BP: (113-147)/(83-97) 147/95 (06/02 0741) SpO2:  [97 %-100 %] 100 % (06/02 0741) Weight:  [134 kg] 134 kg (06/01 1205) Physical Exam: General: Ill appearing, tired, NAD, conversant Cardiovascular: RRR, NRMG Respiratory: End expiratory wheezing, good air movement and WOB bilaterally, no rales or crackles.  Abdomen: soft, non-distended, non TTP Extremities: Moving all extremities, no edema  Laboratory: Most recent CBC Lab Results  Component Value Date   WBC 12.0 (H) 10/24/2022   HGB 12.0 (L) 10/24/2022   HCT 35.7 (L) 10/24/2022   MCV 95.2 10/24/2022   PLT 215 10/24/2022   Most recent BMP    Latest Ref Rng & Units 10/24/2022    4:22 AM  BMP  Glucose 70 - 99 mg/dL 960   BUN 6 - 20 mg/dL 17   Creatinine 4.54 - 1.24 mg/dL 0.98   Sodium 119 - 147 mmol/L 131   Potassium  3.5 - 5.1 mmol/L 3.8   Chloride 98 - 111 mmol/L 97   CO2 22 - 32 mmol/L 21   Calcium 8.9 - 10.3 mg/dL 9.3      Bess Kinds, MD 10/24/2022, 11:50 AM  PGY-2, [facility] FPTS Intern pager: [phone], text pages welcome Secure chat group [facility] Teaching Service

## 2022-10-24 NOTE — Assessment & Plan Note (Addendum)
Status post pacemaker in 2022.  Ventricular pacing on monitor. HR stable overnight. -Continue home metoprolol succinate 25 mg daily

## 2022-10-24 NOTE — Assessment & Plan Note (Signed)
Creatinine is 1.31 today, not far from baseline of 1.23.  No evidence of proteinuria on UA. -Continue ARB -Caution with nephrotoxic meds

## 2022-10-24 NOTE — Assessment & Plan Note (Signed)
Recently homeless.  Now staying with his sister. - TOC for housing resources 

## 2022-10-24 NOTE — Assessment & Plan Note (Signed)
Seems most likely related to osmotic diuresis in the setting of marked hyperglycemia.  Has trace leuks on UA. Low concern for UTI, but patient is covered by CAP abx. Will treat hyperglycemia to see if improves urinary symptoms. -Diabetic control as above

## 2022-10-24 NOTE — Assessment & Plan Note (Addendum)
Fairly impressive scrotal edema on admission.  Tender to palpation, and reports history of discharge from the scrotum. Of note, does have a history of scrotal cyst excision in 2022. Scrotal US shows large left hydrocele and bilateral epididymal head cyst, no signs of abscess. Hydrocele stable this am, scrotum the size of small orange.  -CTM

## 2022-10-24 NOTE — Assessment & Plan Note (Signed)
Patient notes he's been feeling hopeless and depressed lately, and notes some passive SI, wishing things would end, but no plans to end life or harm self. Does not want to speak with chaplain, and feels like the medicine he's on is not working, will increase dose. -Increase duloxetine to 120 mg daily

## 2022-10-24 NOTE — Assessment & Plan Note (Signed)
Ventricular pacing on monitor.  No evidence of A-fib at present.  On Eliquis and metoprolol at home. -Continue home Eliquis and metoprolol 

## 2022-10-25 ENCOUNTER — Other Ambulatory Visit (HOSPITAL_COMMUNITY): Payer: Self-pay

## 2022-10-25 DIAGNOSIS — J189 Pneumonia, unspecified organism: Secondary | ICD-10-CM | POA: Diagnosis present

## 2022-10-25 DIAGNOSIS — I421 Obstructive hypertrophic cardiomyopathy: Secondary | ICD-10-CM | POA: Diagnosis present

## 2022-10-25 DIAGNOSIS — R3915 Urgency of urination: Secondary | ICD-10-CM | POA: Diagnosis present

## 2022-10-25 DIAGNOSIS — Z1152 Encounter for screening for COVID-19: Secondary | ICD-10-CM | POA: Diagnosis not present

## 2022-10-25 DIAGNOSIS — I77810 Thoracic aortic ectasia: Secondary | ICD-10-CM | POA: Diagnosis present

## 2022-10-25 DIAGNOSIS — F1021 Alcohol dependence, in remission: Secondary | ICD-10-CM | POA: Diagnosis present

## 2022-10-25 DIAGNOSIS — F1490 Cocaine use, unspecified, uncomplicated: Secondary | ICD-10-CM | POA: Diagnosis present

## 2022-10-25 DIAGNOSIS — N1831 Chronic kidney disease, stage 3a: Secondary | ICD-10-CM | POA: Diagnosis present

## 2022-10-25 DIAGNOSIS — E1165 Type 2 diabetes mellitus with hyperglycemia: Secondary | ICD-10-CM | POA: Diagnosis not present

## 2022-10-25 DIAGNOSIS — F332 Major depressive disorder, recurrent severe without psychotic features: Secondary | ICD-10-CM | POA: Diagnosis present

## 2022-10-25 DIAGNOSIS — I480 Paroxysmal atrial fibrillation: Secondary | ICD-10-CM | POA: Diagnosis present

## 2022-10-25 DIAGNOSIS — Z6841 Body Mass Index (BMI) 40.0 and over, adult: Secondary | ICD-10-CM | POA: Diagnosis not present

## 2022-10-25 DIAGNOSIS — I495 Sick sinus syndrome: Secondary | ICD-10-CM | POA: Diagnosis present

## 2022-10-25 DIAGNOSIS — E1141 Type 2 diabetes mellitus with diabetic mononeuropathy: Secondary | ICD-10-CM | POA: Diagnosis not present

## 2022-10-25 DIAGNOSIS — R0602 Shortness of breath: Secondary | ICD-10-CM | POA: Diagnosis not present

## 2022-10-25 DIAGNOSIS — J449 Chronic obstructive pulmonary disease, unspecified: Secondary | ICD-10-CM | POA: Diagnosis present

## 2022-10-25 DIAGNOSIS — J440 Chronic obstructive pulmonary disease with acute lower respiratory infection: Secondary | ICD-10-CM | POA: Diagnosis present

## 2022-10-25 DIAGNOSIS — B3789 Other sites of candidiasis: Secondary | ICD-10-CM | POA: Diagnosis not present

## 2022-10-25 DIAGNOSIS — I080 Rheumatic disorders of both mitral and aortic valves: Secondary | ICD-10-CM | POA: Diagnosis present

## 2022-10-25 DIAGNOSIS — E1122 Type 2 diabetes mellitus with diabetic chronic kidney disease: Secondary | ICD-10-CM | POA: Diagnosis present

## 2022-10-25 DIAGNOSIS — F17210 Nicotine dependence, cigarettes, uncomplicated: Secondary | ICD-10-CM | POA: Diagnosis present

## 2022-10-25 DIAGNOSIS — J441 Chronic obstructive pulmonary disease with (acute) exacerbation: Secondary | ICD-10-CM | POA: Diagnosis present

## 2022-10-25 DIAGNOSIS — R739 Hyperglycemia, unspecified: Secondary | ICD-10-CM | POA: Diagnosis not present

## 2022-10-25 DIAGNOSIS — I5032 Chronic diastolic (congestive) heart failure: Secondary | ICD-10-CM | POA: Diagnosis present

## 2022-10-25 DIAGNOSIS — R45851 Suicidal ideations: Secondary | ICD-10-CM | POA: Diagnosis present

## 2022-10-25 DIAGNOSIS — Z66 Do not resuscitate: Secondary | ICD-10-CM | POA: Diagnosis present

## 2022-10-25 DIAGNOSIS — L304 Erythema intertrigo: Secondary | ICD-10-CM | POA: Diagnosis present

## 2022-10-25 DIAGNOSIS — I130 Hypertensive heart and chronic kidney disease with heart failure and stage 1 through stage 4 chronic kidney disease, or unspecified chronic kidney disease: Secondary | ICD-10-CM | POA: Diagnosis present

## 2022-10-25 LAB — BASIC METABOLIC PANEL
Anion gap: 12 (ref 5–15)
Anion gap: 13 (ref 5–15)
Anion gap: 13 (ref 5–15)
Anion gap: 13 (ref 5–15)
Anion gap: 16 — ABNORMAL HIGH (ref 5–15)
Anion gap: 10 (ref 5–15)
Anion gap: 15 (ref 5–15)
BUN: 29 mg/dL — ABNORMAL HIGH (ref 6–20)
BUN: 28 mg/dL — ABNORMAL HIGH (ref 6–20)
BUN: 28 mg/dL — ABNORMAL HIGH (ref 6–20)
BUN: 27 mg/dL — ABNORMAL HIGH (ref 6–20)
BUN: 29 mg/dL — ABNORMAL HIGH (ref 6–20)
BUN: 29 mg/dL — ABNORMAL HIGH (ref 6–20)
BUN: 32 mg/dL — ABNORMAL HIGH (ref 6–20)
CO2: 21 mmol/L — ABNORMAL LOW (ref 22–32)
CO2: 18 mmol/L — ABNORMAL LOW (ref 22–32)
CO2: 20 mmol/L — ABNORMAL LOW (ref 22–32)
CO2: 19 mmol/L — ABNORMAL LOW (ref 22–32)
CO2: 22 mmol/L (ref 22–32)
CO2: 22 mmol/L (ref 22–32)
CO2: 20 mmol/L — ABNORMAL LOW (ref 22–32)
Calcium: 9 mg/dL (ref 8.9–10.3)
Calcium: 9 mg/dL (ref 8.9–10.3)
Calcium: 9.4 mg/dL (ref 8.9–10.3)
Calcium: 9.2 mg/dL (ref 8.9–10.3)
Calcium: 9.7 mg/dL (ref 8.9–10.3)
Calcium: 9.2 mg/dL (ref 8.9–10.3)
Calcium: 9.2 mg/dL (ref 8.9–10.3)
Chloride: 93 mmol/L — ABNORMAL LOW (ref 98–111)
Chloride: 92 mmol/L — ABNORMAL LOW (ref 98–111)
Chloride: 94 mmol/L — ABNORMAL LOW (ref 98–111)
Chloride: 94 mmol/L — ABNORMAL LOW (ref 98–111)
Chloride: 92 mmol/L — ABNORMAL LOW (ref 98–111)
Chloride: 99 mmol/L (ref 98–111)
Chloride: 98 mmol/L (ref 98–111)
Creatinine, Ser: 1.5 mg/dL — ABNORMAL HIGH (ref 0.61–1.24)
Creatinine, Ser: 1.53 mg/dL — ABNORMAL HIGH (ref 0.61–1.24)
Creatinine, Ser: 1.39 mg/dL — ABNORMAL HIGH (ref 0.61–1.24)
Creatinine, Ser: 1.39 mg/dL — ABNORMAL HIGH (ref 0.61–1.24)
Creatinine, Ser: 1.74 mg/dL — ABNORMAL HIGH (ref 0.61–1.24)
Creatinine, Ser: 1.75 mg/dL — ABNORMAL HIGH (ref 0.61–1.24)
Creatinine, Ser: 1.71 mg/dL — ABNORMAL HIGH (ref 0.61–1.24)
GFR, Estimated: 54 mL/min — ABNORMAL LOW (ref >60)
GFR, Estimated: 52 mL/min — ABNORMAL LOW (ref >60)
GFR, Estimated: 59 mL/min — ABNORMAL LOW (ref >60)
GFR, Estimated: 59 mL/min — ABNORMAL LOW (ref >60)
GFR, Estimated: 45 mL/min — ABNORMAL LOW (ref >60)
GFR, Estimated: 45 mL/min — ABNORMAL LOW (ref >60)
GFR, Estimated: 46 mL/min — ABNORMAL LOW (ref >60)
Glucose, Bld: 611 mg/dL (ref 70–99)
Glucose, Bld: 677 mg/dL (ref 70–99)
Glucose, Bld: 495 mg/dL — ABNORMAL HIGH (ref 70–99)
Glucose, Bld: 520 mg/dL (ref 70–99)
Glucose, Bld: 404 mg/dL — ABNORMAL HIGH (ref 70–99)
Glucose, Bld: 278 mg/dL — ABNORMAL HIGH (ref 70–99)
Glucose, Bld: 183 mg/dL — ABNORMAL HIGH (ref 70–99)
Potassium: 5.5 mmol/L — ABNORMAL HIGH (ref 3.5–5.1)
Potassium: 4.4 mmol/L (ref 3.5–5.1)
Potassium: 4.3 mmol/L (ref 3.5–5.1)
Potassium: 4.1 mmol/L (ref 3.5–5.1)
Potassium: 3.9 mmol/L (ref 3.5–5.1)
Potassium: 3.6 mmol/L (ref 3.5–5.1)
Potassium: 3.7 mmol/L (ref 3.5–5.1)
Sodium: 126 mmol/L — ABNORMAL LOW (ref 135–145)
Sodium: 123 mmol/L — ABNORMAL LOW (ref 135–145)
Sodium: 127 mmol/L — ABNORMAL LOW (ref 135–145)
Sodium: 126 mmol/L — ABNORMAL LOW (ref 135–145)
Sodium: 130 mmol/L — ABNORMAL LOW (ref 135–145)
Sodium: 131 mmol/L — ABNORMAL LOW (ref 135–145)
Sodium: 133 mmol/L — ABNORMAL LOW (ref 135–145)

## 2022-10-25 LAB — GLUCOSE, CAPILLARY
Glucose-Capillary: 590 mg/dL (ref 70–99)
Glucose-Capillary: >600 mg/dL (ref 70–99)
Glucose-Capillary: >600 mg/dL (ref 70–99)
Glucose-Capillary: >600 mg/dL (ref 70–99)
Glucose-Capillary: 525 mg/dL (ref 70–99)
Glucose-Capillary: 508 mg/dL (ref 70–99)
Glucose-Capillary: 501 mg/dL (ref 70–99)
Glucose-Capillary: 529 mg/dL (ref 70–99)
Glucose-Capillary: 421 mg/dL — ABNORMAL HIGH (ref 70–99)
Glucose-Capillary: 467 mg/dL — ABNORMAL HIGH (ref 70–99)
Glucose-Capillary: 383 mg/dL — ABNORMAL HIGH (ref 70–99)
Glucose-Capillary: 325 mg/dL — ABNORMAL HIGH (ref 70–99)
Glucose-Capillary: 242 mg/dL — ABNORMAL HIGH (ref 70–99)
Glucose-Capillary: 199 mg/dL — ABNORMAL HIGH (ref 70–99)
Glucose-Capillary: 188 mg/dL — ABNORMAL HIGH (ref 70–99)

## 2022-10-25 LAB — HEMOGLOBIN A1C
Hgb A1c MFr Bld: >15.5 % — ABNORMAL HIGH (ref 4.8–5.6)
Mean Plasma Glucose: >398 mg/dL

## 2022-10-25 LAB — CBC
HCT: 36.5 % — ABNORMAL LOW (ref 39.0–52.0)
Hemoglobin: 12.2 g/dL — ABNORMAL LOW (ref 13.0–17.0)
MCH: 32.1 pg (ref 26.0–34.0)
MCHC: 33.4 g/dL (ref 30.0–36.0)
MCV: 96.1 fL (ref 80.0–100.0)
Platelets: 238 10*3/uL (ref 150–400)
RBC: 3.8 MIL/uL — ABNORMAL LOW (ref 4.22–5.81)
RDW: 12.1 % (ref 11.5–15.5)
WBC: 18.1 10*3/uL — ABNORMAL HIGH (ref 4.0–10.5)
nRBC: 0 % (ref 0.0–0.2)

## 2022-10-25 LAB — OSMOLALITY: Osmolality: 305 mOsm/kg — ABNORMAL HIGH (ref 275–295)

## 2022-10-25 MED ORDER — IPRATROPIUM-ALBUTEROL 0.5-2.5 (3) MG/3ML IN SOLN: 3.0000 mL | RESPIRATORY_TRACT | Status: DC | PRN

## 2022-10-25 MED ORDER — DEXTROSE IN LACTATED RINGERS 5 % IV SOLN: INTRAVENOUS | Status: DC

## 2022-10-25 MED ORDER — IPRATROPIUM-ALBUTEROL 0.5-2.5 (3) MG/3ML IN SOLN
3.0000 mL | RESPIRATORY_TRACT | Status: DC
  Administered 2022-10-25 (×2): 3 mL via RESPIRATORY_TRACT
  Filled 2022-10-25 (×2): qty 3

## 2022-10-25 MED ORDER — DEXTROSE 50 % IV SOLN: 0.0000 mL | INTRAVENOUS | Status: DC | PRN

## 2022-10-25 MED ORDER — INSULIN ASPART 100 UNIT/ML IJ SOLN
15.0000 [IU] | Freq: Once | INTRAMUSCULAR | Status: AC
  Administered 2022-10-25: 15 [IU] via SUBCUTANEOUS

## 2022-10-25 MED ORDER — LACTATED RINGERS IV SOLN: INTRAVENOUS | Status: DC

## 2022-10-25 MED ORDER — POTASSIUM CHLORIDE 10 MEQ/100ML IV SOLN
10.0000 meq | INTRAVENOUS | Status: AC
  Administered 2022-10-25 – 2022-10-26 (×4): 10 meq via INTRAVENOUS
  Filled 2022-10-25 (×4): qty 100

## 2022-10-25 MED ORDER — INSULIN REGULAR(HUMAN) IN NACL 100-0.9 UT/100ML-% IV SOLN
INTRAVENOUS | Status: DC
  Administered 2022-10-25: 10 [IU]/h via INTRAVENOUS
  Administered 2022-10-25: 10.5 [IU]/h via INTRAVENOUS
  Administered 2022-10-25: 19 [IU]/h via INTRAVENOUS
  Administered 2022-10-25: 25 [IU]/h via INTRAVENOUS
  Administered 2022-10-26: 17 [IU]/h via INTRAVENOUS
  Administered 2022-10-26: 11.5 [IU]/h via INTRAVENOUS
  Filled 2022-10-25 (×2): qty 100

## 2022-10-25 MED ORDER — INSULIN GLARGINE-YFGN 100 UNIT/ML ~~LOC~~ SOLN
20.0000 [IU] | Freq: Every day | SUBCUTANEOUS | Status: DC
  Administered 2022-10-25: 20 [IU] via SUBCUTANEOUS
  Filled 2022-10-25 (×2): qty 0.2

## 2022-10-25 NOTE — Progress Notes (Signed)
Patient is to be transferred to [address] west ,Room 15. Called the report to the floor nurse at 1250 PM.

## 2022-10-25 NOTE — Progress Notes (Signed)
   10/25/22 1400  Spiritual Encounters  Type of Visit Initial  Care provided to: Patient  Referral source Patient request  Reason for visit Routine spiritual support  OnCall Visit No  Spiritual Framework  Presenting Themes Meaning/purpose/sources of inspiration;Coping tools;Caregiving needs  Values/beliefs independence  Needs/Challenges/Barriers lack of shelter, and caregiver  Patient Stress Factors Health changes;Major life changes  Goals  Self/Personal Goals mobility, skill care facility  Interventions  Spiritual Care Interventions Made Compassionate presence;Reflective listening;Normalization of emotions   Ch responded to request for emotional and spiritual support. There was no family at bedside. Pt said he is in a bad space because he lost his independence. His family are not helping him, instead he gets ridicule and humiliated. He is afraid that he will hurt someone, especially his family. Pt said what he needs is emergency shelter and he would like to be transferred to a skill care facility so that he can regain his strength. Ch provided compassionate presence and facilitated communication between pt and doctor. No follow-up needed at this time.

## 2022-10-25 NOTE — Progress Notes (Signed)
Daily Progress Note Intern Pager: [phone]  Patient name: Allen Gould Medical record number: [id number] Date of birth: [birth year] Age: 57 y.o. Gender: male  Primary Care Provider: Sharmon Revere, MD Consultants: none Code Status: DNR  Pt Overview and Major Events to Date:  6/1 admitted  Assessment and Plan:  Allen Gould is a 57 yo male presenting wih SOB, cough and sputum production, thought to 2/2 to asthma exacerbation +/- CAP Also with significant hyperglycemia.   He has a PMH significant for Tachy-Brady syndrome s/p pacemaker, HFpEF, A Fib on Eliquis, T2DM, CKD 3a    * Shortness of breath Likely asthma exacerbation +/- CAP. CXR no focal consolidation, afebrile, SORA. Wheezing on exam. Euvolemic.  PFTs in Jan 2023 with FEV1/FVC 99% of predicted suggesting against COPD.  - Prednisone 40 mg daily - Azithromycin 250mg  qd - Duonebs q4h scheduled - Smoking cessation encouraged - supplement O2 as needed - PT/OT  Type II diabetes mellitus (HCC) CBGs elevated since admission. On steroids as well. This morning >600, not responding to 15u SAI. Suspect this is not directly related to steroid treatment as he was very elevated upon arrival as well. A1c pending. Plan to treat as HHS. Pseudohyponatremia noted as well in s/o hyperglycemia. No anion gap, no N/V. Home regimen includes Trulicity and metformin.  Most recent A1c 6.3% 01/28/22. No ketonuria.  - Start endotool, IVF per protocol - q4h BMP - F/u A1c - Continue home Lipitor  Scrotal swelling Fairly impressive scrotal edema on admission.  Tender to palpation, and reports history of discharge from the scrotum. Of note, does have a history of scrotal cyst excision in 2022. Scrotal US shows large left hydrocele and bilateral epididymal head cyst, no signs of abscess. Hydrocele stable this am.  -CTM  Urinary urgency Stable, possibly 2/2 uncontrolled diabetes, treating hyperglycemia as above  Severe depression  (HCC) Duloxetine increased to 120mg  daily due to worsening depression.   Intertrigo Intertrigo of the groin. Candidal in appearance. Likely exacerbated by increased UOP in the setting of hyperglycemia. - Nystatin powder TID   (HFpEF) heart failure with preserved ejection fraction (HCC) Euvolemic. Ongoing cocaine use, last use about 1wk prior to admission. -Home lasix 40mg  qd -Daily weights, strict I/O -Cocaine cessation  Tachycardia-bradycardia syndrome (HCC) Status post pacemaker in 2022.  Ventricular pacing on monitor. HR stable overnight. -Continue home metoprolol succinate 25 mg daily  PAF (paroxysmal atrial fibrillation) (HCC) Ventricular pacing on monitor.  No evidence of A-fib at present.  On Eliquis and metoprolol at home. -Continue home Eliquis and metoprolol  Stage 3a chronic kidney disease (CKD) (HCC) Close to baseline Cr -Continue ARB -Caution with nephrotoxic meds  Housing instability after recent homelessness Recently homeless.  Now staying with his sister. - TOC for housing resources  Substance abuse [facility] Patient using cocaine regularly, when he can afford it. Report's last use 1 wk ago.  -TOC for substance use       FEN/GI: NPO (endotool) PPx: Eliquis Dispo:Pending PT recommendations  pending clinical improvement . Barriers include IV insulin.   Subjective:  Feels about the same this morning, still wheezing. Denies pain, N/V, swelling  Objective: Temp:  [97.6 F (36.4 C)-98.7 F (37.1 C)] 98.7 F (37.1 C) (06/03 9562) Pulse Rate:  [78-89] 84 (06/03 0632) Resp:  [17-18] 17 (06/03 1308) BP: (100-123)/(58-89) 113/74 (06/03 6578) SpO2:  [97 %-100 %] 98 % (06/03 1212) Physical Exam: General: NAD, alert Cardiovascular: RRR no MRG Respiratory: b/l expiratory wheezes  Abdomen: soft  NT/ND Extremities: no edema  Laboratory: Most recent CBC Lab Results  Component Value Date   WBC 18.1 (H) 10/25/2022   HGB 12.2 (L) 10/25/2022   HCT 36.5 (L)  10/25/2022   MCV 96.1 10/25/2022   PLT 238 10/25/2022   Most recent BMP    Latest Ref Rng & Units 10/25/2022    8:19 AM  BMP  Glucose 70 - 99 mg/dL 811   BUN 6 - 20 mg/dL 29   Creatinine 9.14 - 1.24 mg/dL 7.82   Sodium 956 - 213 mmol/L 126   Potassium 3.5 - 5.1 mmol/L 5.5   Chloride 98 - 111 mmol/L 93   CO2 22 - 32 mmol/L 21   Calcium 8.9 - 10.3 mg/dL 9.0     Vonna Drafts, MD 10/25/2022, 12:20 PM  PGY-1, [facility] FPTS Intern pager: [phone], text pages welcome Secure chat group [facility] Teaching Service

## 2022-10-25 NOTE — TOC Initial Note (Signed)
Transition of Care [facility]) - Initial/Assessment Note    Patient Details  Name: Allen Gould MRN: [id number] Date of Birth: [birth year]  Transition of Care [facility]) CM/SW Contact:    Leomia Blake A [place], Theresia Majors Phone Number: 10/25/2022, 1:25 PM  Clinical Narrative:                 [name] met with pt at bedside.    Pt states that he lives with his relatives and receives SSI and SSD and incentives. He said that he needs more support at home and feels that his family is not helpful. He reported substance use as a coping mechanism for his current situation.[name] provided SUD resources and education for pt regarding substance use for reported cocaine use.  Pt was intermittently tearful during conversation. He said that he would be interested in community mental health resources and had just started with a program that will support him with mental health and substance use counseling. He said he believes it is a "community support team" or CST. He stated that the program will also assist with him finding a more permanent housing solution.   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   He stated "I want rehab" and is interested in going to  [facility] as he has been there in the past.   Pt has traditional Medicare. [name] will follow up with Patient Access in order to get pt's insurance information uploaded to EMR.   SNF work up to be completed.   TOC will continue to follow.   Expected Discharge Plan: Skilled Nursing Facility Barriers to Discharge: Continued Medical Work up, SNF Pending bed offer, Insurance authorization   Patient Goals and CMS Choice Patient states their goals for this hospitalization and ongoing recovery are:: go to rehab          Expected Discharge Plan and Services       Living arrangements for the past 2 months:  Single Family Home                                      Prior Living Arrangements/Services Living arrangements for the past 2 months: Single Family Home Lives with:: Relatives                   Activities of Daily Living Home Assistive Devices/Equipment: Environmental consultant (specify type) (Rollator) ADL Screening (condition at time of admission) Patient's cognitive ability adequate to safely complete daily activities?: Yes Is the patient deaf or have difficulty hearing?: No Does the patient have difficulty seeing, even when wearing glasses/contacts?: No Does the patient have difficulty concentrating, remembering, or making decisions?: No Patient able to express need for assistance with ADLs?: Yes Does the patient have difficulty dressing or bathing?: Yes Independently performs ADLs?: No Communication: Independent Dressing (OT): Needs assistance Is this a change from baseline?: Pre-admission baseline Does the patient have difficulty walking or climbing stairs?: Yes Weakness of Legs: Both Weakness of Arms/Hands: None  Permission Sought/Granted                  Emotional Assessment Appearance:: Appears older than stated age Attitude/Demeanor/Rapport: Engaged Affect (typically observed): Sad Orientation: : Oriented to Self, Oriented to  Place, Oriented to  Time, Oriented to Situation Alcohol / Substance Use: Illicit Drugs (cocaine) Psych Involvement: No (comment)  Admission diagnosis:  COPD (chronic obstructive pulmonary disease) (HCC) [J44.9] Hyperglycemia [R73.9] COPD exacerbation (HCC) [J44.1] Candida rash of groin [B37.89] CAP (community acquired pneumonia) [J18.9] Patient Active Problem List   Diagnosis Date Noted   CAP (community acquired pneumonia) 10/25/2022   Substance abuse (HCC) 10/24/2022   Shortness of breath 10/23/2022   Urinary urgency 10/23/2022   Scrotal swelling 10/23/2022   Housing instability after recent homelessness 10/23/2022   Intertrigo  10/23/2022   Acute renal failure superimposed on stage 3a chronic kidney disease (HCC) 01/28/2022   Acute renal failure superimposed on chronic kidney disease (HCC) 01/28/2022   Acute cystitis without hematuria    Stage 3a chronic kidney disease (CKD) (HCC) 10/07/2021   Chest pain 09/17/2021   CAD (coronary artery disease) 06/30/2021   Hypertrophic cardiomyopathy (HCC) 03/25/2021   Cardiac pacemaker in situ 03/25/2021   Normocytic anemia 11/14/2020   Acquired dilation of ascending aorta and aortic root (HCC)    Aortic stenosis    Hip pain 2/2 osteoarthritis 06/12/2020   Tachycardia-bradycardia syndrome (HCC) 06/05/2020   (HFpEF) heart failure with preserved ejection fraction (HCC) 06/02/2020   Severe depression (HCC) 04/24/2020   Physical deconditioning 04/24/2020   Second degree AV block, Mobitz type I    Bifascicular block    PAC (premature atrial contraction)    PVCs (premature ventricular contractions)    PAF (paroxysmal atrial fibrillation) (HCC)    Essential hypertension 10/22/2016   Health care maintenance 03/01/2014   Morbid obesity (HCC) 08/29/2013   Type II diabetes mellitus (HCC) 08/10/2013   Asthma    GERD (gastroesophageal reflux disease)    PCP:  Sharmon Revere, MD Pharmacy:   [facility] - [place], [place] - [address] [place] [address] Phone: [phone] Fax: [phone]  [name] [address] [place] [address] Phone: [phone] Fax: [phone]  [place] [facility] [address] [place] [address] Phone: [phone] Fax: [phone]     Social Determinants of Health (SDOH) Social History: SDOH Screenings   Food Insecurity: Food Insecurity Present (10/23/2022)  Housing: High Risk (10/24/2022)  Transportation Needs: No Transportation Needs (10/24/2022)  Utilities: At Risk (10/24/2022)  Depression (PHQ2-9): High Risk  (10/06/2021)  Financial Resource Strain: High Risk (05/22/2021)  Stress: Stress Concern Present (05/15/2021)  Tobacco Use: Medium Risk (10/23/2022)   SDOH Interventions:     Readmission Risk Interventions     No data to display

## 2022-10-25 NOTE — Progress Notes (Addendum)
CM noted patient admitted from home with SOB, pneumonia.  The patient has history of substance abuse (cocaine use) and smoking - resources included in the AVS for follow up.  SDOH assessment from bedside nursing noted for resource - Social Services resources included in the AVS.  List of shelter included in the AVS as well, considering patient is homeless and residing at family's home at this time.  Patient has Medicaid coverage for transportation, medication and healthcare coverage.  Patient will active listed PCP for follow up.

## 2022-10-25 NOTE — Assessment & Plan Note (Signed)
Status post pacemaker in 2022.  Ventricular pacing on monitor. HR stable overnight. -Continue home metoprolol succinate 25 mg daily 

## 2022-10-25 NOTE — Assessment & Plan Note (Signed)
Close to baseline Cr -Continue ARB -Caution with nephrotoxic meds

## 2022-10-25 NOTE — Assessment & Plan Note (Addendum)
Stable, possibly 2/2 uncontrolled diabetes, treating hyperglycemia as above

## 2022-10-25 NOTE — Assessment & Plan Note (Addendum)
Likely asthma exacerbation +/- CAP. CXR no focal consolidation, afebrile, SORA. Wheezing on exam. Euvolemic.  PFTs in Jan 2023 with FEV1/FVC 99% of predicted suggesting against COPD.  - Prednisone 40 mg daily - Azithromycin 250mg  qd - Duonebs q4h scheduled - Smoking cessation encouraged - supplement O2 as needed - PT/OT

## 2022-10-25 NOTE — TOC Benefit Eligibility Note (Signed)
Patient Product/process development scientist completed.    The patient is currently admitted and upon discharge could be taking Symbicort 160 mcg.  The current 30 day co-pay is $0.00.   The patient is currently admitted and upon discharge could be taking Albuterol 108 mcg.  The current 30 day co-pay is $0.00.   The patient is insured through [employer] Part D   This test claim was processed through [name]- copay amounts may vary at other pharmacies due to pharmacy/plan contracts, or as the patient moves through the different stages of their insurance plan.  Roland Earl, CPHT Pharmacy Patient Advocate Specialist [facility] Patient Advocate Team Direct Number: [phone]  Fax: [phone]

## 2022-10-25 NOTE — Progress Notes (Signed)
Pt arrived to unit. Clothes in bag. Empty bubble pack. One day of medication in bubble pack. Will send to pharmacy.

## 2022-10-25 NOTE — Assessment & Plan Note (Signed)
Fairly impressive scrotal edema on admission.  Tender to palpation, and reports history of discharge from the scrotum. Of note, does have a history of scrotal cyst excision in 2022. Scrotal US shows large left hydrocele and bilateral epididymal head cyst, no signs of abscess. Hydrocele stable this am.  -CTM

## 2022-10-25 NOTE — Assessment & Plan Note (Signed)
Intertrigo of the groin. Candidal in appearance. Likely exacerbated by increased UOP in the setting of hyperglycemia. - Nystatin powder TID

## 2022-10-25 NOTE — Assessment & Plan Note (Signed)
Duloxetine increased to 120mg  daily due to worsening depression.

## 2022-10-25 NOTE — Progress Notes (Signed)
Critical lab results  Received a call from Lab tech saying that glucose level is 611 at 0945 AM. Made the provider , Julius Bowels MD aware via secure chat.

## 2022-10-25 NOTE — Evaluation (Signed)
Physical Therapy Evaluation Patient Details Name: Allen Gould MRN: [id number] DOB: 07/05/1965 Today's Date: 10/25/2022  History of Present Illness  57 y.o. male presents to [facility] hospital with SOB, cough, admitted for management of likely CAP. PMH includes tachy-brady syndrome s/p pacemaker, HFpEF, afib, DMII, CKDIII.  Clinical Impression  Pt presents to PT with deficits in strength, power, gait, balance, endurance, functional mobility. Pt reports he has been living on his sister's couch for the last month, unable to perform ADLs or ambulate to the toilet consistently due to LE weakness and pain. Pt is able to ambulate for short household distances at this time, but fatigues quickly. Pt reports he has had success in short term inpatient rehab in the past. PT recommends short term inpatient PT services in an effort to improve endurance and to restore independence in ambulation and ADL management.   Recommendations for follow up therapy are one component of a multi-disciplinary discharge planning process, led by the attending physician.  Recommendations may be updated based on patient status, additional functional criteria and insurance authorization.  Follow Up Recommendations Can patient physically be transported by private vehicle: Yes     Assistance Recommended at Discharge Intermittent Supervision/Assistance  Patient can return home with the following  A little help with walking and/or transfers;A lot of help with bathing/dressing/bathroom;Assistance with cooking/housework;Assist for transportation;Help with stairs or ramp for entrance    Equipment Recommendations BSC/3in1  Recommendations for Other Services       Functional Status Assessment Patient has had a recent decline in their functional status and demonstrates the ability to make significant improvements in function in a reasonable and predictable amount of time.     Precautions / Restrictions Precautions Precautions:  Fall Restrictions Weight Bearing Restrictions: No      Mobility  Bed Mobility               General bed mobility comments: received and left in recliner    Transfers Overall transfer level: Needs assistance Equipment used: Rollator (4 wheels) Transfers: Sit to/from Stand Sit to Stand: Min guard                Ambulation/Gait Ambulation/Gait assistance: Min guard Gait Distance (Feet): 50 Feet (additional trial of 40') Assistive device: Rollator (4 wheels) Gait Pattern/deviations: Step-through pattern Gait velocity: reduced Gait velocity interpretation: <1.31 ft/sec, indicative of household ambulator   General Gait Details: slowed step-through gait, pt holding breaks to improve stability due to increased weightbearing through UEs to offload hips  Stairs            Wheelchair Mobility    Modified Rankin (Stroke Patients Only)       Balance Overall balance assessment: Needs assistance Sitting-balance support: No upper extremity supported, Feet supported Sitting balance-Leahy Scale: Good     Standing balance support: Bilateral upper extremity supported, Reliant on assistive device for balance Standing balance-Leahy Scale: Poor                               Pertinent Vitals/Pain Pain Assessment Pain Assessment: Faces Faces Pain Scale: Hurts whole lot Pain Location: hips Pain Descriptors / Indicators: Aching Pain Intervention(s): Monitored during session    Home Living Family/patient expects to be discharged to:: Unsure Living Arrangements: Other relatives                 Additional Comments: pt reports he has been living with his sister, staying on her couch.  Reports family have been unable to assist him. Wants to go to short term rehab to improve strength, activity tolerance, and ability to manage ADLs    Prior Function Prior Level of Function : Needs assist             Mobility Comments: ambulates short household  distances with rollator ADLs Comments: modI with ADLs, performs most in sitting     Hand Dominance   Dominant Hand: Right    Extremity/Trunk Assessment   Upper Extremity Assessment Upper Extremity Assessment: Overall WFL for tasks assessed    Lower Extremity Assessment Lower Extremity Assessment: Generalized weakness    Cervical / Trunk Assessment Cervical / Trunk Assessment: Normal  Communication   Communication: No difficulties  Cognition Arousal/Alertness: Awake/alert Behavior During Therapy: WFL for tasks assessed/performed Overall Cognitive Status: Within Functional Limits for tasks assessed                                          General Comments General comments (skin integrity, edema, etc.): VSS on RA    Exercises     Assessment/Plan    PT Assessment Patient needs continued PT services  PT Problem List Decreased strength;Decreased activity tolerance;Decreased mobility;Decreased balance;Pain       PT Treatment Interventions DME instruction;Gait training;Functional mobility training;Therapeutic activities;Therapeutic exercise;Balance training;Neuromuscular re-education;Patient/family education    PT Goals (Current goals can be found in the Care Plan section)  Acute Rehab PT Goals Patient Stated Goal: to improve activity tolerance and independence with ADLs PT Goal Formulation: With patient Time For Goal Achievement: 11/08/22 Potential to Achieve Goals: Fair    Frequency Min 2X/week     Co-evaluation               AM-PAC PT "6 Clicks" Mobility  Outcome Measure Help needed turning from your back to your side while in a flat bed without using bedrails?: A Little Help needed moving from lying on your back to sitting on the side of a flat bed without using bedrails?: A Little Help needed moving to and from a bed to a chair (including a wheelchair)?: A Little Help needed standing up from a chair using your arms (e.g., wheelchair or  bedside chair)?: A Little Help needed to walk in hospital room?: A Little Help needed climbing 3-5 steps with a railing? : Total 6 Click Score: 16    End of Session   Activity Tolerance: Patient tolerated treatment well Patient left: in chair;with call bell/phone within reach Nurse Communication: Mobility status PT Visit Diagnosis: Other abnormalities of gait and mobility (R26.89);Muscle weakness (generalized) (M62.81);Pain Pain - Right/Left:  (bilateral) Pain - part of body: Hip    Time: 1128-1150 PT Time Calculation (min) (ACUTE ONLY): 22 min   Charges:   PT Evaluation $PT Eval Low Complexity: 1 Low          Arlyss Gandy, PT, DPT Acute Rehabilitation Office [phone]   Arlyss Gandy 10/25/2022, 12:33 PM

## 2022-10-25 NOTE — Assessment & Plan Note (Signed)
Euvolemic. Ongoing cocaine use, last use about 1wk prior to admission. -Home lasix 40mg  qd -Daily weights, strict I/O -Cocaine cessation

## 2022-10-25 NOTE — Progress Notes (Signed)
Spoke with RN-patient glucose has been below 200 x 2. Will give 20 subQ insulin and after he may eat. Keep on insulin drip for 2 more hours before discontinuing. Will start sliding scale. Likely will need mealtime coverage given large amount of insulin requirement on endotool, A1c >15.5

## 2022-10-25 NOTE — Assessment & Plan Note (Signed)
Recently homeless.  Now staying with his sister. - TOC for housing resources 

## 2022-10-25 NOTE — Assessment & Plan Note (Signed)
Ventricular pacing on monitor.  No evidence of A-fib at present.  On Eliquis and metoprolol at home. -Continue home Eliquis and metoprolol

## 2022-10-25 NOTE — Assessment & Plan Note (Addendum)
CBGs elevated since admission. On steroids as well. This morning >600, not responding to 15u SAI. Suspect this is not directly related to steroid treatment as he was very elevated upon arrival as well. A1c pending. Plan to treat as HHS. Pseudohyponatremia noted as well in s/o hyperglycemia. No anion gap, no N/V. Home regimen includes Trulicity and metformin.  Most recent A1c 6.3% 01/28/22. No ketonuria.  - Start endotool, IVF per protocol - q4h BMP - F/u A1c - Continue home Lipitor

## 2022-10-25 NOTE — Assessment & Plan Note (Signed)
Patient using cocaine regularly, when he can afford it. Report's last use 1 wk ago.  -TOC for substance use 

## 2022-10-25 NOTE — Evaluation (Signed)
Occupational Therapy Evaluation Patient Details Name: Allen Gould MRN: [id number] DOB: 10/19/1965 Today's Date: 10/25/2022   History of Present Illness 57 y.o. male presents to [facility] hospital with SOB, cough, admitted for management of likely CAP. PMH includes tachy-brady syndrome s/p pacemaker, HFpEF, afib, DMII, CKDIII.   Clinical Impression   PTA pt states he has been staying on his sister's couch, however has had increased difficulty taking care of himself over the last 6 weeks. States his family does not "really help him out".  Pt demonstrates a decline in functional status, requiring min A with ADL and mobility due to below deficits. Patient will benefit from continued inpatient follow up therapy, <3 hours/day. Acute OT to follow.      Recommendations for follow up therapy are one component of a multi-disciplinary discharge planning process, led by the attending physician.  Recommendations may be updated based on patient status, additional functional criteria and insurance authorization.   Assistance Recommended at Discharge Frequent or constant Supervision/Assistance  Patient can return home with the following A little help with bathing/dressing/bathroom;Assistance with cooking/housework;Assist for transportation    Functional Status Assessment  Patient has had a recent decline in their functional status and demonstrates the ability to make significant improvements in function in a reasonable and predictable amount of time.  Equipment Recommendations  BSC/3in1    Recommendations for Other Services       Precautions / Restrictions Precautions Precautions: Fall Restrictions Weight Bearing Restrictions: No      Mobility Bed Mobility Overal bed mobility: Needs Assistance Bed Mobility: Supine to Sit, Sit to Supine     Supine to sit: Supervision Sit to supine: Min assist        Transfers Overall transfer level: Needs assistance Equipment used: Rollator (4  wheels) Transfers: Sit to/from Stand Sit to Stand: Min guard                  Balance Overall balance assessment: Needs assistance Sitting-balance support: No upper extremity supported, Feet supported Sitting balance-Leahy Scale: Good     Standing balance support: Bilateral upper extremity supported, Reliant on assistive device for balance Standing balance-Leahy Scale: Poor                             ADL either performed or assessed with clinical judgement   ADL Overall ADL's : Needs assistance/impaired     Grooming: Set up   Upper Body Bathing: Set up;Sitting   Lower Body Bathing: Minimal assistance;Sit to/from stand   Upper Body Dressing : Set up;Sitting   Lower Body Dressing: Minimal assistance;Sit to/from stand   Toilet Transfer: Rolling walker (2 wheels);Min guard   Toileting- Architect and Hygiene: Min guard Toileting - Clothing Manipulation Details (indicate cue type and reason): increased urinary frequency; reports having "many accidents"     Functional mobility during ADLs: Minimal assistance;Rolling walker (2 wheels) General ADL Comments: would benefit from use of AE Complaining of groin discomfort. Educated pt on use of a pillowcase to absorb moisture and to "hammock" scrotum when moving to the EOB. Pt verbalized understanding.      Vision Baseline Vision/History: 1 Wears glasses (at all times; does not have in hospital)       Perception     Praxis      Pertinent Vitals/Pain Pain Assessment Pain Assessment: Faces Faces Pain Scale: Hurts whole lot Pain Location: hips Pain Descriptors / Indicators: Aching     Hand Dominance  Right   Extremity/Trunk Assessment Upper Extremity Assessment Upper Extremity Assessment: Generalized weakness   Lower Extremity Assessment Lower Extremity Assessment: Defer to PT evaluation   Cervical / Trunk Assessment Cervical / Trunk Assessment: Normal;Other exceptions (increased body  habitus)   Communication Communication Communication: No difficulties   Cognition Arousal/Alertness: Awake/alert Behavior During Therapy: WFL for tasks assessed/performed Overall Cognitive Status: Within Functional Limits for tasks assessed                                       General Comments  VSS on RA    Exercises Exercises: Other exercises Other Exercises Other Exercises: issued level 2 theraband for general BUE strengthening   Shoulder Instructions      Home Living Family/patient expects to be discharged to:: Unsure Living Arrangements: Other relatives                               Additional Comments: pt reports he has been living with his sister, staying on her couch. Reports family have been unable to assist him. Wants to go to short term rehab to improve strength, activity tolerance, and ability to manage ADLs      Prior Functioning/Environment Prior Level of Function : Needs assist             Mobility Comments: ambulates short household distances with rollator ADLs Comments: modI with ADLs, performs most in sitting        OT Problem List: Decreased strength;Decreased activity tolerance;Impaired balance (sitting and/or standing);Decreased knowledge of use of DME or AE;Obesity;Pain      OT Treatment/Interventions: Self-care/ADL training;Therapeutic exercise;DME and/or AE instruction;Energy conservation;Therapeutic activities;Patient/family education;Balance training    OT Goals(Current goals can be found in the care plan section) Acute Rehab OT Goals Patient Stated Goal: to get some rehab OT Goal Formulation: With patient Time For Goal Achievement: 11/08/22 Potential to Achieve Goals: Good  OT Frequency: Min 2X/week    Co-evaluation              AM-PAC OT "6 Clicks" Daily Activity     Outcome Measure Help from another person eating meals?: None Help from another person taking care of personal grooming?: A  Little Help from another person toileting, which includes using toliet, bedpan, or urinal?: A Little Help from another person bathing (including washing, rinsing, drying)?: A Little Help from another person to put on and taking off regular upper body clothing?: A Little Help from another person to put on and taking off regular lower body clothing?: A Little 6 Click Score: 19   End of Session Equipment Utilized During Treatment: Gait belt;Rolling walker (2 wheels) Nurse Communication: Mobility status  Activity Tolerance: Patient tolerated treatment well Patient left: in bed;with call bell/phone within reach;with bed alarm set  OT Visit Diagnosis: Unsteadiness on feet (R26.81);Muscle weakness (generalized) (M62.81);Pain Pain - part of body: Hip (B hips; groin)                Time: 4098-1191 OT Time Calculation (min): 25 min Charges:  OT General Charges $OT Visit: 1 Visit OT Evaluation $OT Eval Moderate Complexity: 1 Mod OT Treatments $Self Care/Home Management : 8-22 mins  Luisa Dago, OT/L   Acute OT Clinical Specialist Acute Rehabilitation Services Pager [phone] Office [phone]   [facility] 10/25/2022, 3:55 PM

## 2022-10-25 NOTE — Inpatient Diabetes Management (Signed)
Inpatient Diabetes Program Recommendations  AACE/ADA: New Consensus Statement on Inpatient Glycemic Control (2015)  Target Ranges:  Prepandial:   less than 140 mg/dL      Peak postprandial:   less than 180 mg/dL (1-2 hours)      Critically ill patients:  140 - 180 mg/dL   Lab Results  Component Value Date   GLUCAP 590 (HH) 10/25/2022   HGBA1C 6.3 (H) 01/28/2022    Review of Glycemic Control  Latest Reference Range & Units 10/24/22 07:40 10/24/22 12:17 10/24/22 16:34 10/24/22 19:56 10/25/22 08:39 10/25/22 08:42  Glucose-Capillary 70 - 99 mg/dL 161 (H) 096 (H) 045 (H) 437 (H) >600 (HH) 590 (HH)  (HH): Data is critically high (H): Data is abnormally high Diabetes history: Type 2 DM Outpatient Diabetes medications: Trulicity 1.5 mg qwk, Metformin 500 mg BID Current orders for Inpatient glycemic control: Semglee 5 units QHS, Novolog 0-15 units TID Prednisone 40 mg QD  A1C pending  Inpatient Diabetes Program Recommendations:    Noted CBGs range from 400-670's mg/dL since admission. Anticipate A1C result to be significantly elevated and to require insulin on discharge.  At this time, recommend IV insulin to safely assess insulin needs.  Discussed with family medicine at 1000.  Thanks, Lujean Rave, MSN, RNC-OB Diabetes Coordinator [phone] (8a-5p)

## 2022-10-26 ENCOUNTER — Other Ambulatory Visit (HOSPITAL_COMMUNITY): Payer: Self-pay

## 2022-10-26 DIAGNOSIS — F332 Major depressive disorder, recurrent severe without psychotic features: Secondary | ICD-10-CM

## 2022-10-26 DIAGNOSIS — R0602 Shortness of breath: Secondary | ICD-10-CM | POA: Diagnosis not present

## 2022-10-26 LAB — BASIC METABOLIC PANEL
Anion gap: 10 (ref 5–15)
Anion gap: 11 (ref 5–15)
BUN: 34 mg/dL — ABNORMAL HIGH (ref 6–20)
BUN: 34 mg/dL — ABNORMAL HIGH (ref 6–20)
CO2: 22 mmol/L (ref 22–32)
CO2: 22 mmol/L (ref 22–32)
Calcium: 8.9 mg/dL (ref 8.9–10.3)
Calcium: 8.9 mg/dL (ref 8.9–10.3)
Chloride: 101 mmol/L (ref 98–111)
Chloride: 98 mmol/L (ref 98–111)
Creatinine, Ser: 1.41 mg/dL — ABNORMAL HIGH (ref 0.61–1.24)
Creatinine, Ser: 1.65 mg/dL — ABNORMAL HIGH (ref 0.61–1.24)
GFR, Estimated: 48 mL/min — ABNORMAL LOW (ref >60)
GFR, Estimated: 58 mL/min — ABNORMAL LOW (ref >60)
Glucose, Bld: 211 mg/dL — ABNORMAL HIGH (ref 70–99)
Glucose, Bld: 314 mg/dL — ABNORMAL HIGH (ref 70–99)
Potassium: 3.8 mmol/L (ref 3.5–5.1)
Potassium: 4.4 mmol/L (ref 3.5–5.1)
Sodium: 131 mmol/L — ABNORMAL LOW (ref 135–145)
Sodium: 133 mmol/L — ABNORMAL LOW (ref 135–145)

## 2022-10-26 LAB — TSH: TSH: 1.181 u[IU]/mL (ref 0.350–4.500)

## 2022-10-26 LAB — GLUCOSE, CAPILLARY
Glucose-Capillary: 186 mg/dL — ABNORMAL HIGH (ref 70–99)
Glucose-Capillary: 214 mg/dL — ABNORMAL HIGH (ref 70–99)
Glucose-Capillary: 284 mg/dL — ABNORMAL HIGH (ref 70–99)
Glucose-Capillary: 337 mg/dL — ABNORMAL HIGH (ref 70–99)
Glucose-Capillary: 357 mg/dL — ABNORMAL HIGH (ref 70–99)
Glucose-Capillary: 419 mg/dL — ABNORMAL HIGH (ref 70–99)
Glucose-Capillary: 426 mg/dL — ABNORMAL HIGH (ref 70–99)
Glucose-Capillary: 471 mg/dL — ABNORMAL HIGH (ref 70–99)
Glucose-Capillary: 506 mg/dL (ref 70–99)

## 2022-10-26 LAB — FOLATE: Folate: 7.1 ng/mL (ref >5.9)

## 2022-10-26 LAB — VITAMIN B12: Vitamin B-12: 509 pg/mL (ref 180–914)

## 2022-10-26 MED ORDER — INSULIN ASPART 100 UNIT/ML IJ SOLN: 15.0000 [IU] | Freq: Once | INTRAMUSCULAR | Status: DC

## 2022-10-26 MED ORDER — INSULIN GLARGINE-YFGN 100 UNIT/ML ~~LOC~~ SOLN
30.0000 [IU] | Freq: Two times a day (BID) | SUBCUTANEOUS | Status: DC
  Administered 2022-10-26: 30 [IU] via SUBCUTANEOUS
  Filled 2022-10-26 (×3): qty 0.3

## 2022-10-26 MED ORDER — INSULIN ASPART 100 UNIT/ML IJ SOLN
20.0000 [IU] | Freq: Once | INTRAMUSCULAR | Status: AC
  Administered 2022-10-26: 20 [IU] via SUBCUTANEOUS

## 2022-10-26 MED ORDER — INSULIN ASPART 100 UNIT/ML IJ SOLN
0.0000 [IU] | Freq: Three times a day (TID) | INTRAMUSCULAR | Status: DC
  Administered 2022-10-26: 15 [IU] via SUBCUTANEOUS

## 2022-10-26 MED ORDER — INSULIN GLARGINE-YFGN 100 UNIT/ML ~~LOC~~ SOLN
20.0000 [IU] | Freq: Two times a day (BID) | SUBCUTANEOUS | Status: DC
  Filled 2022-10-26: qty 0.2

## 2022-10-26 MED ORDER — INSULIN ASPART 100 UNIT/ML IJ SOLN
10.0000 [IU] | Freq: Three times a day (TID) | INTRAMUSCULAR | Status: DC
  Administered 2022-10-26 – 2022-10-28 (×7): 10 [IU] via SUBCUTANEOUS

## 2022-10-26 MED ORDER — INSULIN GLARGINE-YFGN 100 UNIT/ML ~~LOC~~ SOLN
20.0000 [IU] | Freq: Once | SUBCUTANEOUS | Status: AC
  Administered 2022-10-26: 20 [IU] via SUBCUTANEOUS
  Filled 2022-10-26: qty 0.2

## 2022-10-26 MED ORDER — INSULIN ASPART 100 UNIT/ML IJ SOLN
0.0000 [IU] | Freq: Three times a day (TID) | INTRAMUSCULAR | Status: DC
  Administered 2022-10-26: 10 [IU] via SUBCUTANEOUS
  Administered 2022-10-27 (×2): 15 [IU] via SUBCUTANEOUS
  Administered 2022-10-27: 20 [IU] via SUBCUTANEOUS
  Administered 2022-10-28 (×2): 7 [IU] via SUBCUTANEOUS
  Administered 2022-10-28: 11 [IU] via SUBCUTANEOUS

## 2022-10-26 NOTE — Assessment & Plan Note (Signed)
Euvolemic. Ongoing cocaine use, last use about 1wk prior to admission. -Home lasix 40mg qd -Daily weights, strict I/O -Cocaine cessation 

## 2022-10-26 NOTE — Inpatient Diabetes Management (Addendum)
Inpatient Diabetes Program Recommendations  AACE/ADA: New Consensus Statement on Inpatient Glycemic Control (2015)  Target Ranges:  Prepandial:   less than 140 mg/dL      Peak postprandial:   less than 180 mg/dL (1-2 hours)      Critically ill patients:  140 - 180 mg/dL   Lab Results  Component Value Date   GLUCAP 337 (H) 10/26/2022   HGBA1C >15.5 (H) 10/23/2022    Review of Glycemic Control  Latest Reference Range & Units 10/25/22 20:58 10/25/22 21:58 10/25/22 23:00 10/26/22 00:00 10/26/22 01:04 10/26/22 05:41 10/26/22 06:51  Glucose-Capillary 70 - 99 mg/dL 960 (H) 454 (H) 098 (H) 214 (H) 186 (H) 284 (H) 337 (H)  (H): Data is abnormally high  Diabetes history: DM2 Outpatient Diabetes medications: Trulicity 1.5 mg weekly, Metformin 500 mg BID Current orders for Inpatient glycemic control: Semglee 20 units QD, Novolog 0-20 TID , prednisone 40 mg QAM  Inpatient Diabetes Program Recommendations:    Patient has transitioned of of IV insulin.  Given large drip rates, please consider:  Semglee 20 units BID Will likely need meals coverage TID   Addendum@1423 :  Spoke with patient at bedside.  Reviewed patient's current A1c of >15.5%. Explained what a A1c is and what it measures. Also reviewed goal A1c with patient, importance of good glucose control @ home, and blood sugar goals. He states he is currently homeless.  He has been having trouble obtaining healthy foods and is eating what he can get his hands on.  He knows exactly what he needs to do to get his glucose under control; he is having difficulty with mobility and is hoping to go to SNF to get stronger.     Discussed the need for insulin at discharge.  He is agreeable; he has used insulin in the past using the insulin pen.  Reviewed administration, hypoglycemia, signs, symptoms and treatments.   Will continue to follow while inpatient.  Thank you, Dulce Sellar, MSN, CDCES Diabetes Coordinator Inpatient Diabetes  Program [phone] (team pager from 8a-5p)
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
## 2022-10-26 NOTE — Assessment & Plan Note (Signed)
Recently homeless.  Now staying with his sister. - TOC for housing resources 

## 2022-10-26 NOTE — Assessment & Plan Note (Addendum)
On endotool yesterday for hyperglycemia, transitioned off around midnight. A1c >15. AM glucose around 300.  Home regimen: Trulicity, metformin -Increase Semglee to 20u BID -rSSI -Novolog 10u TID WC

## 2022-10-26 NOTE — Progress Notes (Signed)
Daily Progress Note Intern Pager: [phone]  Patient name: Allen Gould Medical record number: [id number] Date of birth: 1965/10/04 Age: 57 y.o. Gender: male  Primary Care Provider: Sharmon Revere, MD Consultants: none Code Status: DNR  Pt Overview and Major Events to Date:  6/1 admitted   Assessment and Plan:   Allen Gould is a 57 yo male presenting wih SOB, cough and sputum production, thought to 2/2 to asthma exacerbation +/- CAP Also with significant hyperglycemia.    He has a PMH significant for Tachy-Brady syndrome s/p pacemaker, HFpEF, A Fib on Eliquis, T2DM, CKD 3a   Active Hospital Problems   *Shortness of breath           Likely asthma exacerbation +/- CAP. CXR no focal           consolidation, afebrile, SORA. Wheezing on exam,           similar to yesterday. Euvolemic. PFTs in Jan 2023           with FEV1/FVC 99% of predicted suggesting against           COPD.            - Prednisone 40 mg daily           - Azithromycin 250mg  qd           - Duonebs q4h scheduled           - Start maintenance ICS, appreciate pharmacy           assistance           - Smoking cessation encouraged           - supplement O2 as needed           - PT/OT    Type II diabetes mellitus (HCC)           On endotool yesterday for hyperglycemia,           transitioned off around midnight.            A1c >15. AM glucose around 300.            Home regimen: Trulicity, metformin           -Increase Semglee to 20u BID           -rSSI           -Novolog 10u TID WC    Scrotal swelling           Fairly impressive scrotal edema on admission.            Tender to palpation, and reports history of           discharge from the scrotum. Of note, does have a           history of scrotal cyst excision in 2022. Scrotal           US shows large left hydrocele and bilateral           epididymal head cyst, no signs of abscess.            Hydrocele stable this am.            -CTM            -Will likely need outpatient urology    Urinary urgency           Stable, possibly 2/2 uncontrolled diabetes,           treating  hyperglycemia as            above     Severe depression (HCC)        Chronic           Duloxetine increased to 120mg  daily due to           worsening depression. Pt reporting concerns with           the way he is treated by his family, has endorsed           passive SI and said that he might harm family           members if they continue to treat him poorly.           -Psychiatry consulted, appreciate recommendations    Intertrigo           Intertrigo of the groin. Candidal in appearance.           Likely exacerbated by increased UOP in the setting           of hyperglycemia.           - Nystatin powder TID    (HFpEF) heart failure with preserved ejection fraction (HCC)        Chronic           Euvolemic. Ongoing cocaine use, last use about 1wk           prior to admission.           -Home lasix 40mg  qd           -Daily weights, strict I/O           -Cocaine cessation    Tachycardia-bradycardia syndrome (HCC)        Chronic           Status post pacemaker in 2022.  Ventricular pacing           on monitor. HR stable overnight.           -Continue home metoprolol succinate 25 mg daily    PAF (paroxysmal atrial fibrillation) (HCC)           Ventricular pacing on monitor.  No evidence of           A-fib at present.  On Eliquis and metoprolol at           home.           -Continue home Eliquis and metoprolol    Stage 3a chronic kidney disease (CKD) (HCC)           Close to baseline Cr           -Continue ARB           -Caution with nephrotoxic meds    Housing instability after recent homelessness           Recently homeless.  Now staying with his sister.           - TOC for housing resources    CAP (community acquired pneumonia)    Substance abuse [facility])           Patient using cocaine regularly, when he can           afford it. Report's  last use            1 wk ago.            -TOC for substance use   Resolved Hospital Problems   Candida rash of groin  Date Resolved: 10/24/2022    Hyperglycemia        Date Resolved: 10/24/2022     FEN/GI: Carb modified diet PPx: Eliquis Dispo:SNF pending clinical improvement . Barriers include continued management of hyperglycemia.   Subjective:  NAEON, states he is feeling better this morning  Objective: Temp:  [97.6 F (36.4 C)-99.1 F (37.3 C)] 99.1 F (37.3 C) (06/04 1243) Pulse Rate:  [69-91] 69 (06/04 1243) Resp:  [16-20] 18 (06/04 1243) BP: (97-128)/(58-94) 120/88 (06/04 1243) SpO2:  [95 %-100 %] 99 % (06/04 1243) Weight:  [129.3 kg] 129.3 kg (06/04 0500) Physical Exam: General: NAD, laying in bed Cardiovascular: RRR no murmurs Respiratory: Bilateral expiratory wheezes Abdomen: Soft NT/ND Extremities: No significant edema  Laboratory: Most recent CBC Lab Results  Component Value Date   WBC 18.1 (H) 10/25/2022   HGB 12.2 (L) 10/25/2022   HCT 36.5 (L) 10/25/2022   MCV 96.1 10/25/2022   PLT 238 10/25/2022   Most recent BMP    Latest Ref Rng & Units 10/26/2022    6:55 AM  BMP  Glucose 70 - 99 mg/dL 161   BUN 6 - 20 mg/dL 34   Creatinine 0.96 - 1.24 mg/dL 0.45   Sodium 409 - 811 mmol/L 131   Potassium 3.5 - 5.1 mmol/L 4.4   Chloride 98 - 111 mmol/L 98   CO2 22 - 32 mmol/L 22   Calcium 8.9 - 10.3 mg/dL 8.9      Allen Drafts, MD 10/26/2022, 12:48 PM  PGY-1, [facility] FPTS Intern pager: [phone], text pages welcome Secure chat group [facility] Teaching Service

## 2022-10-26 NOTE — Assessment & Plan Note (Signed)
Stable, possibly 2/2 uncontrolled diabetes, treating hyperglycemia as above 

## 2022-10-26 NOTE — Assessment & Plan Note (Signed)
Close to baseline Cr -Continue ARB -Caution with nephrotoxic meds 

## 2022-10-26 NOTE — [place] FL2 (Signed)
[place] MEDICAID FL2 LEVEL OF CARE FORM     IDENTIFICATION  Patient Name: Allen Gould Birthdate: 10-02-1965 Sex: male Admission Date (Current Location): 10/23/2022  [facility] and [place] Number:  Producer, television/film/video and Address:  [facility] [place], [address], [place], [place] [address]      Provider Number: [id number]  Attending Physician Name and Address:  Latrelle Dodrill, MD  Relative Name and Phone Number:       Current Level of Care: Hospital Recommended Level of Care: Skilled Nursing Facility Prior Approval Number:    Date Approved/Denied:   PASRR Number: [id number]  Discharge Plan: SNF    Current Diagnoses: Patient Active Problem List   Diagnosis Date Noted   CAP (community acquired pneumonia) 10/25/2022   Substance abuse (HCC) 10/24/2022   Shortness of breath 10/23/2022   Urinary urgency 10/23/2022   Scrotal swelling 10/23/2022   Housing instability after recent homelessness 10/23/2022   Intertrigo 10/23/2022   Acute renal failure superimposed on stage 3a chronic kidney disease (HCC) 01/28/2022   Acute renal failure superimposed on chronic kidney disease (HCC) 01/28/2022   Acute cystitis without hematuria    Stage 3a chronic kidney disease (CKD) (HCC) 10/07/2021   Chest pain 09/17/2021   CAD (coronary artery disease) 06/30/2021   Hypertrophic cardiomyopathy (HCC) 03/25/2021   Cardiac pacemaker in situ 03/25/2021   Normocytic anemia 11/14/2020   Acquired dilation of ascending aorta and aortic root (HCC)    Aortic stenosis    Hip pain 2/2 osteoarthritis 06/12/2020   Tachycardia-bradycardia syndrome (HCC) 06/05/2020   (HFpEF) heart failure with preserved ejection fraction (HCC) 06/02/2020   Severe depression (HCC) 04/24/2020   Physical deconditioning 04/24/2020   Second degree AV block, Mobitz type I    Bifascicular block    PAC (premature atrial contraction)    PVCs (premature ventricular contractions)    PAF  (paroxysmal atrial fibrillation) (HCC)    Essential hypertension 10/22/2016   Health care maintenance 03/01/2014   Morbid obesity (HCC) 08/29/2013   Type II diabetes mellitus (HCC) 08/10/2013   Asthma    GERD (gastroesophageal reflux disease)     Orientation RESPIRATION BLADDER Height & Weight     Self, Time, Situation, Place  Normal Continent Weight: 285 lb 0.9 oz (129.3 kg) Height:  6' (182.9 cm)  BEHAVIORAL SYMPTOMS/MOOD NEUROLOGICAL BOWEL NUTRITION STATUS      Continent Diet (carb modified)  AMBULATORY STATUS COMMUNICATION OF NEEDS Skin   Limited Assist Verbally Normal                       Personal Care Assistance Level of Assistance  Bathing, Feeding, Dressing Bathing Assistance: Limited assistance Feeding assistance: Independent Dressing Assistance: Limited assistance     Functional Limitations Info             SPECIAL CARE FACTORS FREQUENCY  PT (By licensed PT), OT (By licensed OT)     PT Frequency: 5x/wk OT Frequency: 5x/wk            Contractures Contractures Info: Not present    Additional Factors Info  Code Status, Allergies Code Status Info: DNR Allergies Info: Shellfish           Current Medications (10/26/2022):  This is the current hospital active medication list Current Facility-Administered Medications  Medication Dose Route Frequency Provider Last Rate Last Admin   amLODipine (NORVASC) tablet 10 mg  10 mg Oral Daily Alicia Amel, MD  10 mg at 10/26/22 1018   apixaban (ELIQUIS) tablet 5 mg  5 mg Oral BID Darnelle Spangle B, MD   5 mg at 10/26/22 1018   atorvastatin (LIPITOR) tablet 80 mg  80 mg Oral Daily Darnelle Spangle B, MD   80 mg at 10/26/22 1017   azithromycin (ZITHROMAX) tablet 250 mg  250 mg Oral Daily Darnelle Spangle B, MD   250 mg at 10/26/22 1018   cefTRIAXone (ROCEPHIN) 1 g in sodium chloride 0.9 % 100 mL IVPB  1 g Intravenous Q24H Alicia Amel, MD   Stopped at 10/25/22 2309   dextrose 50 % solution 0-50 mL  0-50 mL  Intravenous PRN Celine Mans, MD       DULoxetine (CYMBALTA) DR capsule 120 mg  120 mg Oral Daily Bess Kinds, MD   120 mg at 10/26/22 1018   furosemide (LASIX) tablet 40 mg  40 mg Oral Daily Darnelle Spangle B, MD   40 mg at 10/26/22 1018   gabapentin (NEURONTIN) capsule 300 mg  300 mg Oral TID Darnelle Spangle B, MD   300 mg at 10/26/22 1017   insulin aspart (novoLOG) injection 0-20 Units  0-20 Units Subcutaneous TID WC Levin Erp, MD   15 Units at 10/26/22 0653   insulin glargine-yfgn (SEMGLEE) injection 20 Units  20 Units Subcutaneous QHS Levin Erp, MD   20 Units at 10/25/22 2335   ipratropium-albuterol (DUONEB) 0.5-2.5 (3) MG/3ML nebulizer solution 3 mL  3 mL Nebulization Q4H PRN Latrelle Dodrill, MD       irbesartan (AVAPRO) tablet 75 mg  75 mg Oral Daily Darnelle Spangle B, MD   75 mg at 10/26/22 1018   metoprolol succinate (TOPROL-XL) 24 hr tablet 25 mg  25 mg Oral QHS Darnelle Spangle B, MD   25 mg at 10/24/22 2200   nystatin (MYCOSTATIN/NYSTOP) topical powder   Topical TID Alicia Amel, MD   Given at 10/26/22 1019   pantoprazole (PROTONIX) EC tablet 40 mg  40 mg Oral Daily Bess Kinds, MD   40 mg at 10/26/22 1019   predniSONE (DELTASONE) tablet 40 mg  40 mg Oral Q breakfast Bess Kinds, MD   40 mg at 10/26/22 0607     Discharge Medications: Please see discharge summary for a list of discharge medications.  Relevant Imaging Results:  Relevant Lab Results:   Additional Information SS# [id number]  Baldemar Lenis, LCSW

## 2022-10-26 NOTE — Assessment & Plan Note (Addendum)
Likely asthma exacerbation +/- CAP. CXR no focal consolidation, afebrile, SORA. Wheezing on exam, similar to yesterday. Euvolemic.  PFTs in Jan 2023 with FEV1/FVC 99% of predicted suggesting against COPD.  - Prednisone 40 mg daily - Azithromycin 250mg  qd - Duonebs q4h scheduled - Start maintenance ICS, appreciate pharmacy assistance - Smoking cessation encouraged - supplement O2 as needed - PT/OT

## 2022-10-26 NOTE — Assessment & Plan Note (Signed)
Status post pacemaker in 2022.  Ventricular pacing on monitor. HR stable overnight. -Continue home metoprolol succinate 25 mg daily 

## 2022-10-26 NOTE — Assessment & Plan Note (Signed)
Duloxetine increased to 120mg  daily due to worsening depression. Pt reporting concerns with the way he is treated by his family, has endorsed passive SI and said that he might harm family members if they continue to treat him poorly. -Psychiatry consulted, appreciate recommendations

## 2022-10-26 NOTE — Assessment & Plan Note (Signed)
Intertrigo of the groin. Candidal in appearance. Likely exacerbated by increased UOP in the setting of hyperglycemia. - Nystatin powder TID  

## 2022-10-26 NOTE — Assessment & Plan Note (Signed)
Patient using cocaine regularly, when he can afford it. Report's last use 1 wk ago.  -TOC for substance use 

## 2022-10-26 NOTE — Assessment & Plan Note (Signed)
Ventricular pacing on monitor.  No evidence of A-fib at present.  On Eliquis and metoprolol at home. -Continue home Eliquis and metoprolol 

## 2022-10-26 NOTE — Assessment & Plan Note (Signed)
Fairly impressive scrotal edema on admission.  Tender to palpation, and reports history of discharge from the scrotum. Of note, does have a history of scrotal cyst excision in 2022. Scrotal US shows large left hydrocele and bilateral epididymal head cyst, no signs of abscess. Hydrocele stable this am.  -CTM -Will likely need outpatient urology

## 2022-10-26 NOTE — TOC Progression Note (Addendum)
Transition of Care [name]) - Progression Note    Patient Details  Name: Allen Gould MRN: [id number] Date of Birth: [birth year]  Transition of [name]) CM/SW Contact  Baldemar Lenis, [place] Phone Number: 10/26/2022, 3:25 PM  Clinical Narrative:   [name] faxed out referral on patient now that his Medicare is in his chart. [name] is unable to accept [name], patient will need to go elsewhere. [name] met with patient to confirm he is ok with other options. [name] faxed out referral further, met with patient to discuss bed offers. Patient in agreement with [name]. [name] confirmed bed availability with [name]. [name] requested initiation of insurance authorization with [name]. [name] to follow.  UPDATE: Patient's [employer] is not managed by [name], [name] is unable to start auth. [name] asked [name] to initiate insurance authorization. [name] sent therapy notes to [name]. [name] to follow.    Expected Discharge Plan: Skilled Nursing Facility Barriers to Discharge: Continued Medical Work up, SNF Pending bed offer, Insurance Authorization  Expected Discharge Plan and Services       Living arrangements for the past 2 months: Single Family Home                                       Social Determinants of Health (SDOH) Interventions SDOH Screenings   Food Insecurity: Food Insecurity Present (10/23/2022)  Housing: High Risk (10/24/2022)  Transportation Needs: No Transportation Needs (10/24/2022)  Utilities: At Risk (10/24/2022)  Depression (PHQ2-9): High Risk (10/06/2021)  Financial Resource Strain: High Risk (05/22/2021)  Stress: Stress Concern Present (05/15/2021)  Tobacco Use: Medium Risk (10/23/2022)    Readmission Risk Interventions     No data to display

## 2022-10-27 ENCOUNTER — Other Ambulatory Visit (HOSPITAL_COMMUNITY): Payer: Self-pay

## 2022-10-27 DIAGNOSIS — R0602 Shortness of breath: Secondary | ICD-10-CM | POA: Diagnosis not present

## 2022-10-27 LAB — BASIC METABOLIC PANEL
Anion gap: 12 (ref 5–15)
Anion gap: 12 (ref 5–15)
BUN: 50 mg/dL — ABNORMAL HIGH (ref 6–20)
BUN: 49 mg/dL — ABNORMAL HIGH (ref 6–20)
CO2: 22 mmol/L (ref 22–32)
CO2: 20 mmol/L — ABNORMAL LOW (ref 22–32)
Calcium: 8.7 mg/dL — ABNORMAL LOW (ref 8.9–10.3)
Calcium: 9.6 mg/dL (ref 8.9–10.3)
Chloride: 95 mmol/L — ABNORMAL LOW (ref 98–111)
Chloride: 96 mmol/L — ABNORMAL LOW (ref 98–111)
Creatinine, Ser: 1.46 mg/dL — ABNORMAL HIGH (ref 0.61–1.24)
Creatinine, Ser: 1.48 mg/dL — ABNORMAL HIGH (ref 0.61–1.24)
GFR, Estimated: 55 mL/min — ABNORMAL LOW (ref >60)
GFR, Estimated: 55 mL/min — ABNORMAL LOW (ref >60)
Glucose, Bld: 497 mg/dL — ABNORMAL HIGH (ref 70–99)
Glucose, Bld: 407 mg/dL — ABNORMAL HIGH (ref 70–99)
Potassium: 4.1 mmol/L (ref 3.5–5.1)
Potassium: 4.6 mmol/L (ref 3.5–5.1)
Sodium: 129 mmol/L — ABNORMAL LOW (ref 135–145)
Sodium: 128 mmol/L — ABNORMAL LOW (ref 135–145)

## 2022-10-27 LAB — GLUCOSE, CAPILLARY
Glucose-Capillary: 279 mg/dL — ABNORMAL HIGH (ref 70–99)
Glucose-Capillary: 487 mg/dL — ABNORMAL HIGH (ref 70–99)
Glucose-Capillary: 390 mg/dL — ABNORMAL HIGH (ref 70–99)
Glucose-Capillary: 308 mg/dL — ABNORMAL HIGH (ref 70–99)
Glucose-Capillary: 399 mg/dL — ABNORMAL HIGH (ref 70–99)

## 2022-10-27 MED ORDER — IPRATROPIUM-ALBUTEROL 0.5-2.5 (3) MG/3ML IN SOLN
3.0000 mL | Freq: Four times a day (QID) | RESPIRATORY_TRACT | Status: DC
  Administered 2022-10-27: 3 mL via RESPIRATORY_TRACT
  Filled 2022-10-27: qty 3

## 2022-10-27 MED ORDER — IPRATROPIUM-ALBUTEROL 0.5-2.5 (3) MG/3ML IN SOLN
3.0000 mL | Freq: Four times a day (QID) | RESPIRATORY_TRACT | Status: DC | PRN
  Administered 2022-10-28: 3 mL via RESPIRATORY_TRACT
  Filled 2022-10-27: qty 3

## 2022-10-27 MED ORDER — INSULIN GLARGINE-YFGN 100 UNIT/ML ~~LOC~~ SOLN
45.0000 [IU] | Freq: Two times a day (BID) | SUBCUTANEOUS | Status: DC
  Administered 2022-10-27 – 2022-10-28 (×3): 45 [IU] via SUBCUTANEOUS
  Filled 2022-10-27 (×4): qty 0.45

## 2022-10-27 MED ORDER — INSULIN ASPART 100 UNIT/ML IJ SOLN
20.0000 [IU] | Freq: Once | INTRAMUSCULAR | Status: AC
  Administered 2022-10-27: 20 [IU] via SUBCUTANEOUS

## 2022-10-27 NOTE — Progress Notes (Signed)
Occupational Therapy Treatment Patient Details Name: Allen Gould MRN: [id number] DOB: 1965-12-27 Today's Date: 10/27/2022   History of present illness 57 y.o. male presents to [facility] hospital with SOB, cough, admitted for management of likely CAP. PMH includes tachy-brady syndrome s/p pacemaker, HFpEF, afib, DMII, CKDIII.   OT comments  Pt sitting in recliner with pt's liaison present upon OT arrival. Pt agreeable to participation in skilled OT session and participated well. Pt is highly motivated to continue with skilled rehab services. OT educated pt in use of AE to increase safety and independence with LB ADLs. Pt demonstrated understanding through teach back and demonstrated ability to complete LB dressing in sitting/with lateral leans and with use of AE with Set up to Min guard assist and occasional cues for compensatory techniques. Pt is making good progress toward goals and will benefit from reinforcement of education. Pt will benefit from continued acute skilled OT services. Discharge plan remains appropriate.    Recommendations for follow up therapy are one component of a multi-disciplinary discharge planning process, led by the attending physician.  Recommendations may be updated based on patient status, additional functional criteria and insurance authorization.    Assistance Recommended at Discharge Frequent or constant Supervision/Assistance  Patient can return home with the following  A little help with walking and/or transfers;A little help with bathing/dressing/bathroom;Assistance with cooking/housework;Assist for transportation   Equipment Recommendations       Recommendations for Other Services      Precautions / Restrictions Precautions Precautions: Fall Restrictions Weight Bearing Restrictions: No       Mobility Bed Mobility               General bed mobility comments: Pt sitting in recliner upon OT arrival.    Transfers Overall transfer level: Needs  assistance   Transfers: Sit to/from Stand Sit to Stand: Min guard                 Balance Overall balance assessment: Needs assistance Sitting-balance support: No upper extremity supported, Feet supported Sitting balance-Leahy Scale: Good     Standing balance support: Bilateral upper extremity supported, Reliant on assistive device for balance Standing balance-Leahy Scale: Poor                             ADL either performed or assessed with clinical judgement   ADL Overall ADL's : Needs assistance/impaired             Lower Body Bathing: Set up;Min guard;With adaptive equipment;Cueing for compensatory techniques;Sitting/lateral leans (Set up to Min guard in sitting with use of AE) Lower Body Bathing Details (indicate cue type and reason): with long handled sponge in simulated task     Lower Body Dressing: Min guard;With adaptive equipment;Cueing for compensatory techniques;Set up;Sitting/lateral leans (Set up to Min guard in sitting with use of AE) Lower Body Dressing Details (indicate cue type and reason): with use of sock aid, dressing stick, long handled shoe horn, and reacher               General ADL Comments: OT instructed pt in use of and provided pt with sock aid, dressing stick, long handled sponge, long handled shoe horn, and reacher. Pt demonstrated understanding of use of AE with teach back and will benefit from additional practice with use of AE. OT also educated in use of fall prevention strategies during ADLs with pt verbalizing understanding.    Extremity/Trunk Assessment Upper Extremity  Assessment Upper Extremity Assessment: Overall WFL for tasks assessed            Vision       Perception     Praxis Praxis Praxis: Intact    Cognition Arousal/Alertness: Awake/alert Behavior During Therapy: WFL for tasks assessed/performed Overall Cognitive Status: Within Functional Limits for tasks assessed                                           Exercises      Shoulder Instructions       General Comments VSS on RA throughout session. Pt's liason present throughout session.    Pertinent Vitals/ Pain       Pain Assessment Pain Assessment: No/denies pain  Home Living                                          Prior Functioning/Environment              Frequency  Min 2X/week        Progress Toward Goals  OT Goals(current goals can now be found in the care plan section)  Progress towards OT goals: Progressing toward goals  Acute Rehab OT Goals Patient Stated Goal: To continue with rehab, be more independent, and make good choices about his health care  Plan Discharge plan remains appropriate    Co-evaluation                 AM-PAC OT "6 Clicks" Daily Activity     Outcome Measure   Help from another person eating meals?: None Help from another person taking care of personal grooming?: A Little Help from another person toileting, which includes using toliet, bedpan, or urinal?: A Little Help from another person bathing (including washing, rinsing, drying)?: A Little Help from another person to put on and taking off regular upper body clothing?: A Little Help from another person to put on and taking off regular lower body clothing?: A Little 6 Click Score: 19    End of Session Equipment Utilized During Treatment: Other (comment) (Sock aid, dressing stick, long handled sponge, long handled shoe horn, reacher)  OT Visit Diagnosis: Unsteadiness on feet (R26.81);Muscle weakness (generalized) (M62.81)   Activity Tolerance Patient tolerated treatment well   Patient Left in chair;with call bell/phone within reach;with chair alarm set   Nurse Communication Mobility status        Time: 4098-1191 OT Time Calculation (min): 19 min  Charges: OT General Charges $OT Visit: 1 Visit OT Treatments $Self Care/Home Management : 8-22 mins  Maicie Vanderloop "Orson Eva.,  OTR/L, MA Acute Rehab [phone]   Lendon Colonel 10/27/2022, 5:42 PM

## 2022-10-27 NOTE — Progress Notes (Signed)
Mobility Specialist Progress Note   10/27/22 1604  Mobility  Activity Ambulated with assistance in hallway  Level of Assistance Contact guard assist, steadying assist  Assistive Device Front wheel walker  Distance Ambulated (ft) 102 ft  Activity Response Tolerated well  Mobility Referral Yes  $Mobility charge 1 Mobility  Mobility Specialist Start Time (ACUTE ONLY) 1501  Mobility Specialist Stop Time (ACUTE ONLY) 1534  Mobility Specialist Time Calculation (min) (ACUTE ONLY) 33 min   Received pt in chair w/o complaint and agreeable. Pt able to stand and ambulate in hallway w/ CGA. As ambulation progressed pt's R hip began to have discomfort and make clicking noise but pt able to tolerate until back in room. Returned back to chair w/ call bell in reach and chair alarm on.     Frederico Hamman Mobility Specialist Please contact via SecureChat or  Rehab office at [phone]

## 2022-10-27 NOTE — TOC Benefit Eligibility Note (Signed)
Patient Product/process development scientist completed.    The patient is currently admitted and upon discharge could be taking Lantus Pen.  The current 30 day co-pay is $0.00.   The patient is currently admitted and upon discharge could be taking Novolog FlexPen.  The current 30 day co-pay is $0.00.   The patient is insured through [employer] Part D   This test claim was processed through [name]- copay amounts may vary at other pharmacies due to pharmacy/plan contracts, or as the patient moves through the different stages of their insurance plan.  Roland Earl, CPHT Pharmacy Patient Advocate Specialist [facility] Patient Advocate Team Direct Number: [phone]  Fax: [phone]

## 2022-10-27 NOTE — Assessment & Plan Note (Signed)
No evidence of A-fib at present.  On Eliquis and metoprolol at home. -Continue home Eliquis and metoprolol

## 2022-10-27 NOTE — Assessment & Plan Note (Signed)
Recently homeless.  Now staying with his sister. - TOC for housing resources

## 2022-10-27 NOTE — Assessment & Plan Note (Signed)
Close to baseline Cr -Continue ARB -Caution with nephrotoxic meds 

## 2022-10-27 NOTE — Assessment & Plan Note (Signed)
Duloxetine increased to 120mg daily due to worsening depression. Pt reporting concerns with the way he is treated by his family, has endorsed passive SI and said that he has considered harm to family members if they continue to treat him poorly. -Appreciate psychiatry -Would benefit from therapy outpatient 

## 2022-10-27 NOTE — Assessment & Plan Note (Signed)
Likely asthma exacerbation +/- CAP. CXR no focal consolidation, afebrile, SORA. Wheezing on exam, improved from yesterday. Euvolemic.  PFTs in Jan 2023 with FEV1/FVC 99% of predicted suggesting against COPD.  - Prednisone 40 mg daily - completed Azithromycin course - Duonebs q6h scheduled - Start maintenance ICS (Arnuity 100 1 puff qd) upon discharge, appreciate pharmacy assistance - Smoking cessation encouraged - supplement O2 as needed - PT/OT

## 2022-10-27 NOTE — Assessment & Plan Note (Signed)
Status post pacemaker in 2022. HR stable overnight. -Continue home metoprolol succinate 25 mg daily

## 2022-10-27 NOTE — Assessment & Plan Note (Signed)
Euvolemic. Ongoing cocaine use, last use about 1wk prior to admission. -Home lasix 40mg qd -Daily weights, strict I/O -Cocaine cessation 

## 2022-10-27 NOTE — Inpatient Diabetes Management (Signed)
Inpatient Diabetes Program Recommendations  AACE/ADA: New Consensus Statement on Inpatient Glycemic Control (2015)  Target Ranges:  Prepandial:   less than 140 mg/dL      Peak postprandial:   less than 180 mg/dL (1-2 hours)      Critically ill patients:  140 - 180 mg/dL   Lab Results  Component Value Date   GLUCAP 487 (H) 10/27/2022   HGBA1C >15.5 (H) 10/23/2022    Review of Glycemic Control  Latest Reference Range & Units 10/26/22 16:21 10/26/22 17:51 10/26/22 20:54 10/27/22 01:56 10/27/22 06:18  Glucose-Capillary 70 - 99 mg/dL 161 (H) 096 (H) 045 (H) 279 (H) 487 (H)  (H): Data is abnormally high  Diabetes history: DM2 Outpatient Diabetes medications: Trulicity 1.5 mg weekly, Metformin 500 mg BID Current orders for Inpatient glycemic control: Semglee 30 units BID, Novolog 0-20 TID, Novolog 10 units TID, prednisone 40 mg QAM  Inpatient Diabetes Program Recommendations:    Might consider Novolog 0-20 units Q4H until glucose is better controlled.    Will continue to follow while inpatient.  Thank you, Dulce Sellar, MSN, CDCES Diabetes Coordinator Inpatient Diabetes Program [phone] (team pager from 8a-5p)

## 2022-10-27 NOTE — Assessment & Plan Note (Signed)
Intertrigo of the groin. Candidal in appearance. Likely exacerbated by increased UOP in the setting of hyperglycemia. - Nystatin powder TID  

## 2022-10-27 NOTE — Progress Notes (Signed)
Daily Progress Note Intern Pager: [phone]  Patient name: Allen Gould Medical record number: [id number] Date of birth: 05-09-1966 Age: 57 y.o. Gender: male  Primary Care Provider: Sharmon Revere, MD Consultants: psychiatry Code Status: DNR   Pt Overview and Major Events to Date:  6/1 admitted   Assessment and Plan:   tjuan meller is a 57 yo male presenting wih SOB, cough and sputum production, thought to 2/2 to asthma exacerbation +/- CAP Also with significant hyperglycemia.    He has a PMH significant for Tachy-Brady syndrome s/p pacemaker, HFpEF, A Fib on Eliquis, T2DM, CKD 3a   Active Hospital Problems   *Shortness of breath           Likely asthma exacerbation +/- CAP. CXR no focal           consolidation, afebrile, SORA. Wheezing on exam,           improved from yesterday. Euvolemic. PFTs in Jan           2023 with FEV1/FVC 99% of predicted suggesting           against COPD.            - Prednisone 40 mg daily           - completed Azithromycin course           - Duonebs q6h scheduled           - Start maintenance ICS (Arnuity 100 1 puff qd)           upon discharge, appreciate            pharmacy assistance           - Smoking cessation encouraged           - supplement O2 as needed           - PT/OT    Type II diabetes mellitus (HCC)           Continued hyperglycemia overnight. No anion gap.           Additional correction doses required this morning.           -Semglee increased to 45u BID           -Novolog 10u TID WC           -rSSI           -BMP this afternoon and tomorrow AM    Scrotal swelling           Stable. Fairly impressive scrotal edema on           admission.  Tender to palpation, and reports           history of discharge from the scrotum. Of note,           does have a history of scrotal cyst excision in           2022. Scrotal US shows large left hydrocele and           bilateral epididymal head cyst, no signs of             abscess.            -CTM           -Will likely need outpatient urology    Severe depression (HCC)        Chronic           Duloxetine increased to 120mg   daily due to           worsening depression. Pt reporting concerns with           the way he is treated by his family, has endorsed           passive SI and said that he has considered harm to           family members if they continue to treat him           poorly.           -Appreciate psychiatry           -Would benefit from therapy outpatient    Intertrigo           Intertrigo of the groin. Candidal in appearance.           Likely exacerbated by increased UOP in the setting           of hyperglycemia.           - Nystatin powder TID    (HFpEF) heart failure with preserved ejection fraction (HCC)        Chronic           Euvolemic. Ongoing cocaine use, last use about 1wk           prior to admission.           -Home lasix 40mg  qd           -Daily weights, strict I/O           -Cocaine cessation    Tachycardia-bradycardia syndrome (HCC)        Chronic           Status post pacemaker in 2022. HR stable           overnight.           -Continue home metoprolol succinate 25 mg daily    PAF (paroxysmal atrial fibrillation) (HCC)           No evidence of A-fib at present.  On Eliquis and           metoprolol at home.           -Continue home Eliquis and metoprolol    Stage 3a chronic kidney disease (CKD) (HCC)           Close to baseline Cr           -Continue ARB           -Caution with nephrotoxic meds    Housing instability after recent homelessness           Recently homeless.  Now staying with his sister.           - TOC for housing resources    CAP (community acquired pneumonia)    Substance abuse [facility])           Patient using cocaine regularly, when he can           afford it. Report's last use            1 wk ago.            -TOC for substance use   Resolved Hospital Problems   Candida rash of groin         Date Resolved: 10/24/2022    Hyperglycemia        Date Resolved: 10/24/2022     FEN/GI: Carb modified diet PPx: Eliquis Dispo:SNF pending clinical improvement .  Barriers include continued management of hyperglycemia.   Subjective:  NAEON, resting comfortably in bed.  States breathing is improved.  Objective: Temp:  [97.7 F (36.5 C)-99.1 F (37.3 C)] 98.1 F (36.7 C) (06/05 1115) Pulse Rate:  [69-99] 75 (06/05 1115) Resp:  [18-20] 20 (06/05 1115) BP: (110-139)/(73-88) 110/76 (06/05 1115) SpO2:  [96 %-100 %] 96 % (06/05 1115) Weight:  [130.3 kg] 130.3 kg (06/05 0500) Physical Exam: General: NAD, in bed Cardiovascular: RRR no murmurs Respiratory: Bilateral expiratory wheezes, improved from prior exam Abdomen: Soft NT/ND Extremities: No significant edema  Laboratory: Most recent CBC Lab Results  Component Value Date   WBC 18.1 (H) 10/25/2022   HGB 12.2 (L) 10/25/2022   HCT 36.5 (L) 10/25/2022   MCV 96.1 10/25/2022   PLT 238 10/25/2022   Most recent BMP    Latest Ref Rng & Units 10/27/2022    7:11 AM  BMP  Glucose 70 - 99 mg/dL 784   BUN 6 - 20 mg/dL 50   Creatinine 6.96 - 1.24 mg/dL 2.95   Sodium 284 - 132 mmol/L 129   Potassium 3.5 - 5.1 mmol/L 4.1   Chloride 98 - 111 mmol/L 95   CO2 22 - 32 mmol/L 22   Calcium 8.9 - 10.3 mg/dL 8.7     Vonna Drafts, MD 10/27/2022, 12:39 PM  PGY-1, [facility] Health Family Medicine FPTS Intern pager: [phone], text pages welcome Secure chat group [facility] Teaching Service

## 2022-10-27 NOTE — Progress Notes (Signed)
FMTS Interim Progress Note  Spoke with patient's friend/"liaison" Charlette at bedside, provided update on current plan of care.  Questions answered.  No further concerns.  Vonna Drafts, MD 10/27/2022, 4:26 PM PGY-1, [facility] Family Medicine Service pager [phone]

## 2022-10-27 NOTE — Assessment & Plan Note (Signed)
Patient using cocaine regularly, when he can afford it. Report's last use 1 wk ago.  -TOC for substance use 

## 2022-10-27 NOTE — Assessment & Plan Note (Signed)
Stable. Fairly impressive scrotal edema on admission.  Tender to palpation, and reports history of discharge from the scrotum. Of note, does have a history of scrotal cyst excision in 2022. Scrotal US shows large left hydrocele and bilateral epididymal head cyst, no signs of abscess.  -CTM -Will likely need outpatient urology

## 2022-10-27 NOTE — Assessment & Plan Note (Signed)
Continued hyperglycemia overnight. No anion gap. Additional correction doses required this morning. -Semglee increased to 45u BID -Novolog 10u TID WC -rSSI -BMP this afternoon and tomorrow AM

## 2022-10-28 DIAGNOSIS — F332 Major depressive disorder, recurrent severe without psychotic features: Secondary | ICD-10-CM

## 2022-10-28 DIAGNOSIS — R0602 Shortness of breath: Secondary | ICD-10-CM | POA: Diagnosis not present

## 2022-10-28 LAB — CBC
HCT: 37 % — ABNORMAL LOW (ref 39.0–52.0)
Hemoglobin: 12.1 g/dL — ABNORMAL LOW (ref 13.0–17.0)
MCH: 31.6 pg (ref 26.0–34.0)
MCHC: 32.7 g/dL (ref 30.0–36.0)
MCV: 96.6 fL (ref 80.0–100.0)
Platelets: 227 10*3/uL (ref 150–400)
RBC: 3.83 MIL/uL — ABNORMAL LOW (ref 4.22–5.81)
RDW: 12.4 % (ref 11.5–15.5)
WBC: 15.6 10*3/uL — ABNORMAL HIGH (ref 4.0–10.5)
nRBC: 0 % (ref 0.0–0.2)

## 2022-10-28 LAB — GLUCOSE, CAPILLARY
Glucose-Capillary: 233 mg/dL — ABNORMAL HIGH (ref 70–99)
Glucose-Capillary: 295 mg/dL — ABNORMAL HIGH (ref 70–99)
Glucose-Capillary: 308 mg/dL — ABNORMAL HIGH (ref 70–99)
Glucose-Capillary: 333 mg/dL — ABNORMAL HIGH (ref 70–99)
Glucose-Capillary: 375 mg/dL — ABNORMAL HIGH (ref 70–99)
Glucose-Capillary: 408 mg/dL — ABNORMAL HIGH (ref 70–99)

## 2022-10-28 LAB — BASIC METABOLIC PANEL
Anion gap: 10 (ref 5–15)
BUN: 50 mg/dL — ABNORMAL HIGH (ref 6–20)
CO2: 22 mmol/L (ref 22–32)
Calcium: 9.2 mg/dL (ref 8.9–10.3)
Chloride: 100 mmol/L (ref 98–111)
Creatinine, Ser: 1.56 mg/dL — ABNORMAL HIGH (ref 0.61–1.24)
GFR, Estimated: 51 mL/min — ABNORMAL LOW (ref >60)
Glucose, Bld: 319 mg/dL — ABNORMAL HIGH (ref 70–99)
Potassium: 4.4 mmol/L (ref 3.5–5.1)
Sodium: 132 mmol/L — ABNORMAL LOW (ref 135–145)

## 2022-10-28 MED ORDER — INSULIN ASPART 100 UNIT/ML IJ SOLN
10.0000 [IU] | Freq: Once | INTRAMUSCULAR | Status: AC
  Administered 2022-10-28: 10 [IU] via SUBCUTANEOUS

## 2022-10-28 MED ORDER — INSULIN ASPART 100 UNIT/ML IJ SOLN
15.0000 [IU] | Freq: Three times a day (TID) | INTRAMUSCULAR | Status: DC
  Administered 2022-10-29 – 2022-11-02 (×13): 15 [IU] via SUBCUTANEOUS

## 2022-10-28 MED ORDER — INSULIN GLARGINE-YFGN 100 UNIT/ML ~~LOC~~ SOLN
50.0000 [IU] | Freq: Two times a day (BID) | SUBCUTANEOUS | Status: DC
  Administered 2022-10-28 – 2022-10-30 (×4): 50 [IU] via SUBCUTANEOUS
  Filled 2022-10-28 (×5): qty 0.5

## 2022-10-28 MED ORDER — INSULIN ASPART 100 UNIT/ML IJ SOLN
18.0000 [IU] | Freq: Once | INTRAMUSCULAR | Status: AC
  Administered 2022-10-28: 18 [IU] via SUBCUTANEOUS

## 2022-10-28 NOTE — TOC Progression Note (Signed)
Transition of Care [facility]) - Progression Note    Patient Details  Name: Allen Gould MRN: [id number] Date of Birth: 1965/12/03  [facility] of [facility]) CM/SW Contact  Baldemar Lenis, [place] Phone Number: 10/28/2022, 4:33 PM  Clinical Narrative:   [name] notified that patient's insurance authorization request has gone to peer to peer review. [name] sent information for peer to peer to MD, deadline is 10am tomorrow morning. [name] to follow.    Expected Discharge Plan: Skilled Nursing Facility Barriers to Discharge: Continued Medical Work up, SNF Pending bed offer, Insurance Authorization  Expected Discharge Plan and Services       Living arrangements for the past 2 months: Single Family Home                                       Social Determinants of Health (SDOH) Interventions SDOH Screenings   Food Insecurity: Food Insecurity Present (10/23/2022)  Housing: High Risk (10/24/2022)  Transportation Needs: No Transportation Needs (10/24/2022)  Utilities: At Risk (10/24/2022)  Depression (PHQ2-9): High Risk (10/06/2021)  Financial Resource Strain: High Risk (05/22/2021)  Stress: Stress Concern Present (05/15/2021)  Tobacco Use: Medium Risk (10/23/2022)    Readmission Risk Interventions     No data to display

## 2022-10-28 NOTE — Assessment & Plan Note (Signed)
Duloxetine increased to 120mg  daily due to worsening depression. Pt reporting concerns with the way he is treated by his family, has endorsed passive SI and said that he has considered harm to family members if they continue to treat him poorly. -Appreciate psychiatry -Would benefit from therapy outpatient

## 2022-10-28 NOTE — Care Management Important Message (Signed)
Important Message  Patient Details  Name: Allen Gould MRN: [id number] Date of Birth: 24-Mar-1966   Medicare Important Message Given:  Yes     Asa Baudoin Stefan Church 10/28/2022, 1:33 PM

## 2022-10-28 NOTE — Assessment & Plan Note (Signed)
Recently homeless.  Now staying with his sister. - TOC for housing resources 

## 2022-10-28 NOTE — Progress Notes (Signed)
Daily Progress Note Intern Pager: [phone]  Patient name: Allen Gould Medical record number: [id number] Date of birth: 02-23-1966 Age: 57 y.o. Gender: male  Primary Care Provider: Sharmon Revere, MD Consultants: psychiatry Code Status: DNR   Pt Overview and Major Events to Date:  6/1 admitted   Assessment and Plan:   Allen Gould is a 57 yo male presenting wih SOB, cough and sputum production, thought to 2/2 to asthma exacerbation +/- CAP Also with significant hyperglycemia.    He has a PMH significant for Tachy-Brady syndrome s/p pacemaker, HFpEF, A Fib on Eliquis, T2DM, CKD 3a   Active Hospital Problems   *Shortness of breath           Likely asthma exacerbation +/- CAP. CXR no focal           consolidation, afebrile,            SORA. Lungs clear this morning. Euvolemic.            PFTs in Jan 2023 with FEV1/FVC 99% of predicted           suggesting against COPD.            - Prednisone 40 mg daily (last day today)           - completed antibiotic course           - Duonebs q6h PRN           - Start maintenance ICS (Arnuity 100 1 puff qd)           upon discharge, appreciate            pharmacy assistance           - Smoking cessation encouraged           - supplement O2 as needed           - PT/OT    Type II diabetes mellitus (HCC)           Blood glucose improved through the night, in the           300s. Will monitor today with meals and consider           slight adjustments to his regimen as appropriate.           -Semglee 45u BID           -Novolog 10u TID WC           -rSSI           -q4h CBG           -BMP tomorrow AM    Scrotal swelling           Stable. Fairly impressive scrotal edema on           admission.  Tender to palpation, and reports           history of discharge from the scrotum. Of note,           does have a history of scrotal cyst excision in           2022. Scrotal US shows large left hydrocele and           bilateral  epididymal head cyst, no signs of            abscess.            -CTM           -Will likely  need outpatient urology    Severe depression (HCC)        Chronic           Duloxetine increased to 120mg  daily due to           worsening depression. Pt reporting concerns with           the way he is treated by his family, has endorsed           passive SI and said that he has considered harm to           family members if they continue to treat him           poorly.           -Appreciate psychiatry           -Would benefit from therapy outpatient    Intertrigo           Intertrigo of the groin. Candidal in appearance.           Likely exacerbated by increased UOP in the setting           of hyperglycemia.           - Nystatin powder TID    (HFpEF) heart failure with preserved ejection fraction (HCC)        Chronic           Euvolemic. Ongoing cocaine use, last use about 1wk           prior to admission.           -Home lasix 40mg  qd           -Daily weights, strict I/O           -Cocaine cessation    Tachycardia-bradycardia syndrome (HCC)        Chronic           Status post pacemaker in 2022. HR stable           overnight.           -Continue home metoprolol succinate 25 mg daily    PAF (paroxysmal atrial fibrillation) (HCC)           No evidence of A-fib at present.  On Eliquis and           metoprolol at home.           -Continue home Eliquis and metoprolol    Stage 3a chronic kidney disease (CKD) (HCC)           Close to baseline Cr           -Continue ARB           -Caution with nephrotoxic meds    Housing instability after recent homelessness           Recently homeless.  Now staying with his sister.           - TOC for housing resources    CAP (community acquired pneumonia)    Substance abuse [facility])           Patient using cocaine regularly, when he can           afford it. Report's last use            1 wk ago.            -TOC for substance use   Resolved  Hospital Problems   Candida rash of groin  Date Resolved: 10/24/2022    Hyperglycemia        Date Resolved: 10/24/2022      FEN/GI: Carb modified diet PPx: Eliquis Dispo:SNF pending clinical improvement . Barriers include continued management of hyperglycemia.   Subjective:  NAEON feeling better this morning  Objective: Temp:  [97.7 F (36.5 C)-98.4 F (36.9 C)] 98.3 F (36.8 C) (06/06 1111) Pulse Rate:  [66-90] 66 (06/06 1111) Resp:  [16-20] 18 (06/06 1111) BP: (97-123)/(67-83) 97/67 (06/06 1111) SpO2:  [96 %-98 %] 98 % (06/06 1111) Physical Exam: General: NAD resting comfortably Cardiovascular: RRR no murmur Respiratory: Good air movement bilaterally, no wheezes appreciated Abdomen: soft NTND Extremities: no significant edema  Laboratory: Most recent CBC Lab Results  Component Value Date   WBC 15.6 (H) 10/28/2022   HGB 12.1 (L) 10/28/2022   HCT 37.0 (L) 10/28/2022   MCV 96.6 10/28/2022   PLT 227 10/28/2022   Most recent BMP    Latest Ref Rng & Units 10/28/2022    9:04 AM  BMP  Glucose 70 - 99 mg/dL 409   BUN 6 - 20 mg/dL 50   Creatinine 8.11 - 1.24 mg/dL 9.14   Sodium 782 - 956 mmol/L 132   Potassium 3.5 - 5.1 mmol/L 4.4   Chloride 98 - 111 mmol/L 100   CO2 22 - 32 mmol/L 22   Calcium 8.9 - 10.3 mg/dL 9.2      Allen Drafts, MD 10/28/2022, 12:00 PM  PGY-1, [place] [facility] FPTS Intern pager: [phone], text pages welcome Secure chat group [facility] Teaching Service

## 2022-10-28 NOTE — Assessment & Plan Note (Addendum)
Likely asthma exacerbation +/- CAP. CXR no focal consolidation, afebrile, SORA. Lungs clear this morning. Euvolemic.  PFTs in Jan 2023 with FEV1/FVC 99% of predicted suggesting against COPD.  - Prednisone 40 mg daily (last day today) - completed antibiotic course - Duonebs q6h PRN - Start maintenance ICS (Arnuity 100 1 puff qd) upon discharge, appreciate pharmacy assistance - Smoking cessation encouraged - supplement O2 as needed - PT/OT

## 2022-10-28 NOTE — Assessment & Plan Note (Signed)
Status post pacemaker in 2022. HR stable overnight. -Continue home metoprolol succinate 25 mg daily 

## 2022-10-28 NOTE — Inpatient Diabetes Management (Addendum)
Inpatient Diabetes Program Recommendations  AACE/ADA: New Consensus Statement on Inpatient Glycemic Control   Target Ranges:  Prepandial:   less than 140 mg/dL      Peak postprandial:   less than 180 mg/dL (1-2 hours)      Critically ill patients:  140 - 180 mg/dL    Latest Reference Range & Units 10/28/22 00:43 10/28/22 04:00 10/28/22 07:33 10/28/22 11:09  Glucose-Capillary 70 - 99 mg/dL 147 (H) 829 (H) 562 (H) 233 (H)    Latest Reference Range & Units 10/27/22 06:18 10/27/22 11:12 10/27/22 16:35 10/27/22 21:08  Glucose-Capillary 70 - 99 mg/dL 130 (H) 865 (H) 784 (H) 399 (H)   Review of Glycemic Control  Diabetes history: DM2 Outpatient Diabetes medications: Trulicity 1.5 mg weekly, Metformin 500 mg BID Current orders for Inpatient glycemic control: Semglee 45 units BID, Novolog 0-20 TID with meals, Novolog 10 units TID with meals  Inpatient Diabetes Program Recommendations:    Insulin: Noted Prednisone discontinued (received 40 mg today). Please consider increasing Semglee to 50 units BID, meal coverage to 12 units TID with meals, and adding Novolog 0-5 units QHS.  Thanks, Orlando Penner, RN, MSN, CDCES Diabetes Coordinator Inpatient Diabetes Program [phone] (Team Pager from 8am to 5pm)

## 2022-10-28 NOTE — Assessment & Plan Note (Addendum)
Blood glucose improved through the night, in the 300s. Will monitor today with meals and consider slight adjustments to his regimen as appropriate. -Semglee 45u BID -Novolog 10u TID WC -rSSI -q4h CBG -BMP tomorrow AM

## 2022-10-28 NOTE — Assessment & Plan Note (Signed)
Intertrigo of the groin. Candidal in appearance. Likely exacerbated by increased UOP in the setting of hyperglycemia. - Nystatin powder TID  

## 2022-10-28 NOTE — Assessment & Plan Note (Signed)
No evidence of A-fib at present.  On Eliquis and metoprolol at home. -Continue home Eliquis and metoprolol 

## 2022-10-28 NOTE — Assessment & Plan Note (Signed)
Patient using cocaine regularly, when he can afford it. Report's last use 1 wk ago.  -TOC for substance use 

## 2022-10-28 NOTE — Assessment & Plan Note (Signed)
Stable. Fairly impressive scrotal edema on admission.  Tender to palpation, and reports history of discharge from the scrotum. Of note, does have a history of scrotal cyst excision in 2022. Scrotal US shows large left hydrocele and bilateral epididymal head cyst, no signs of abscess.  -CTM -Will likely need outpatient urology 
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
## 2022-10-28 NOTE — Progress Notes (Signed)
Physical Therapy Treatment Patient Details Name: [facility] MRN: [id number] DOB: [birth year] Today's Date: 10/28/2022   History of Present Illness 57 y.o. male presents to [facility] hospital 10/23/22 with SOB, cough, admitted for management of likely CAP. PMH includes tachy-brady syndrome s/p pacemaker, HFpEF, afib, DMII, CKDIII.    PT Comments    Pt was able to make progress in ambulating an increased distance, but required multiple standing rest breaks and an extended period of time to do so. His slow cadence is suggestive of him being at risk for falls. Pt limited also by his bil hip pain. Once reviewed pt's medical hx for safety, performed grade 1-2 posterior hip glide and bil hip distraction mobilizations. Pt reported relief of his pain with these manual techniques and with bil hamstring stretches. Will continue to follow acutely.     Recommendations for follow up therapy are one component of a multi-disciplinary discharge planning process, led by the attending physician.  Recommendations may be updated based on patient status, additional functional criteria and insurance authorization.  Follow Up Recommendations  Can patient physically be transported by private vehicle: Yes    Assistance Recommended at Discharge Intermittent Supervision/Assistance  Patient can return home with the following A little help with walking and/or transfers;A lot of help with bathing/dressing/bathroom;Assistance with cooking/housework;Assist for transportation;Help with stairs or ramp for entrance   Equipment Recommendations  BSC/3in1    Recommendations for Other Services       Precautions / Restrictions Precautions Precautions: Fall Restrictions Weight Bearing Restrictions: No     Mobility  Bed Mobility               General bed mobility comments: received and left in recliner    Transfers Overall transfer level: Needs assistance Equipment used: None Transfers: Sit to/from Stand Sit to  Stand: Min guard           General transfer comment: Min guard for safety, extra time to power up to stand    Ambulation/Gait Ambulation/Gait assistance: Min guard Gait Distance (Feet): 205 Feet Assistive device: Rolling walker (2 wheels) Gait Pattern/deviations: Step-through pattern, Decreased stride length, Trunk flexed, Step-to pattern Gait velocity: reduced Gait velocity interpretation: <1.31 ft/sec, indicative of household ambulator   General Gait Details: Pt with slow gait and heavy reliance on UEs, performing step-to pattern initially. Progressed to step-through when cued to continue pushing RW rather than stopping it. Pt prefers the RW shorter than recommending height for pt but did agree to bring it up x1 notch and this did improve his upright posture. x >3 standing rest breaks needed, no LOB   Stairs             Wheelchair Mobility    Modified Rankin (Stroke Patients Only)       Balance Overall balance assessment: Needs assistance Sitting-balance support: No upper extremity supported, Feet supported Sitting balance-Leahy Scale: Good     Standing balance support: Bilateral upper extremity supported, No upper extremity supported Standing balance-Leahy Scale: Fair Standing balance comment: Able to stand and reach off COG without UE support but reliant on RW to ambulate                            Cognition Arousal/Alertness: Awake/alert Behavior During Therapy: WFL for tasks assessed/performed Overall Cognitive Status: Within Functional Limits for tasks assessed  Exercises Other Exercises Other Exercises: gentle manual distraction of bil hips with knees extended in supine, grades 1-2, 2x ~10-15 sec each Other Exercises: gentle posterior glide of bil hip joints provided in supine, grades 1-2, 2x ~10 sec each Other Exercises: bil hamstring stretch in supine, 1x ~20 sec each    General  Comments        Pertinent Vitals/Pain Pain Assessment Pain Assessment: Faces Faces Pain Scale: Hurts even more Pain Location: bil hips Pain Descriptors / Indicators: Aching, Discomfort, Grimacing Pain Intervention(s): Limited activity within patient's tolerance, Monitored during session, Repositioned, Other (comment) (gentle stretching and joint mobs to tolerance)    Home Living                          Prior Function            PT Goals (current goals can now be found in the care plan section) Acute Rehab PT Goals Patient Stated Goal: to reduce his pain PT Goal Formulation: With patient Time For Goal Achievement: 11/08/22 Potential to Achieve Goals: Fair Progress towards PT goals: Progressing toward goals    Frequency    Min 2X/week      PT Plan Current plan remains appropriate    Co-evaluation              AM-PAC PT "6 Clicks" Mobility   Outcome Measure  Help needed turning from your back to your side while in a flat bed without using bedrails?: A Little Help needed moving from lying on your back to sitting on the side of a flat bed without using bedrails?: A Little Help needed moving to and from a bed to a chair (including a wheelchair)?: A Little Help needed standing up from a chair using your arms (e.g., wheelchair or bedside chair)?: A Little Help needed to walk in hospital room?: A Little Help needed climbing 3-5 steps with a railing? : Total 6 Click Score: 16    End of Session   Activity Tolerance: Patient tolerated treatment well Patient left: in chair;with call bell/phone within reach Nurse Communication: Mobility status;Other (comment) (RN ok with no chair alarm on) PT Visit Diagnosis: Other abnormalities of gait and mobility (R26.89);Muscle weakness (generalized) (M62.81);Pain;Unsteadiness on feet (R26.81);Difficulty in walking, not elsewhere classified (R26.2) Pain - Right/Left:  (bilateral) Pain - part of body: Hip     Time:  9604-5409 PT Time Calculation (min) (ACUTE ONLY): 24 min  Charges:  $Gait Training: 8-22 mins $Therapeutic Exercise: 8-22 mins                     Raymond Gurney, PT, DPT Acute Rehabilitation Services  Office: [phone]    Jewel Baize 10/28/2022, 3:05 PM

## 2022-10-28 NOTE — Assessment & Plan Note (Signed)
Close to baseline Cr -Continue ARB -Caution with nephrotoxic meds 

## 2022-10-28 NOTE — Assessment & Plan Note (Signed)
Euvolemic. Ongoing cocaine use, last use about 1wk prior to admission. -Home lasix 40mg qd -Daily weights, strict I/O -Cocaine cessation 

## 2022-10-29 DIAGNOSIS — R0602 Shortness of breath: Secondary | ICD-10-CM | POA: Diagnosis not present

## 2022-10-29 LAB — BASIC METABOLIC PANEL
Anion gap: 11 (ref 5–15)
BUN: 52 mg/dL — ABNORMAL HIGH (ref 6–20)
CO2: 19 mmol/L — ABNORMAL LOW (ref 22–32)
Calcium: 9.1 mg/dL (ref 8.9–10.3)
Chloride: 101 mmol/L (ref 98–111)
Creatinine, Ser: 1.43 mg/dL — ABNORMAL HIGH (ref 0.61–1.24)
GFR, Estimated: 57 mL/min — ABNORMAL LOW (ref >60)
Glucose, Bld: 398 mg/dL — ABNORMAL HIGH (ref 70–99)
Potassium: 4.1 mmol/L (ref 3.5–5.1)
Sodium: 131 mmol/L — ABNORMAL LOW (ref 135–145)

## 2022-10-29 LAB — GLUCOSE, CAPILLARY
Glucose-Capillary: 160 mg/dL — ABNORMAL HIGH (ref 70–99)
Glucose-Capillary: 160 mg/dL — ABNORMAL HIGH (ref 70–99)
Glucose-Capillary: 221 mg/dL — ABNORMAL HIGH (ref 70–99)
Glucose-Capillary: 271 mg/dL — ABNORMAL HIGH (ref 70–99)
Glucose-Capillary: 296 mg/dL — ABNORMAL HIGH (ref 70–99)
Glucose-Capillary: 316 mg/dL — ABNORMAL HIGH (ref 70–99)
Glucose-Capillary: 330 mg/dL — ABNORMAL HIGH (ref 70–99)
Glucose-Capillary: 363 mg/dL — ABNORMAL HIGH (ref 70–99)
Glucose-Capillary: 386 mg/dL — ABNORMAL HIGH (ref 70–99)

## 2022-10-29 MED ORDER — INSULIN ASPART 100 UNIT/ML IJ SOLN: 0.0000 [IU] | Freq: Every day | INTRAMUSCULAR | Status: DC

## 2022-10-29 MED ORDER — INSULIN ASPART 100 UNIT/ML IJ SOLN
0.0000 [IU] | Freq: Three times a day (TID) | INTRAMUSCULAR | Status: DC
  Administered 2022-10-29: 4 [IU] via SUBCUTANEOUS
  Administered 2022-10-29: 11 [IU] via SUBCUTANEOUS
  Administered 2022-10-30: 7 [IU] via SUBCUTANEOUS
  Administered 2022-10-30: 4 [IU] via SUBCUTANEOUS
  Administered 2022-10-31: 7 [IU] via SUBCUTANEOUS
  Administered 2022-10-31: 3 [IU] via SUBCUTANEOUS
  Administered 2022-11-01 (×3): 4 [IU] via SUBCUTANEOUS
  Administered 2022-11-02: 7 [IU] via SUBCUTANEOUS

## 2022-10-29 NOTE — Assessment & Plan Note (Signed)
Euvolemic. Ongoing cocaine use, last use about 1wk prior to admission. -Home lasix 40mg qd -Daily weights, strict I/O -Cocaine cessation 

## 2022-10-29 NOTE — TOC Progression Note (Signed)
Transition of Care [facility]) - Progression Note    Patient Details  Name: [facility] MRN: [id number] Date of Birth: August 10, 1965  Transition of Care [facility]) CM/SW Contact  Baldemar Lenis, [place] Phone Number: 10/29/2022, 3:03 PM  Clinical Narrative:   [name] updated by MD that patient's insurance has denied SNF after peer to peer. [name] updated [name] about denial, and met with patient to update him on denial. Patient disagrees with denial, said he wanted to appeal. MD did not receive appeal information after peer to peer, but Joetta Manners received appeal information on denial letter. [name] received copy of denial letter, obtained information on how to complete appeal. [name] provided denial letter to patient for him to call and initiate the fast appeal, completed paperwork and had MD sign paperwork for fast appeal process. [name] faxed over medical records to support patient's appeal request. Francine Graven has 72 hours to respond to appeal request, MD, patient, and [name] aware. [name] to follow.    Expected Discharge Plan: Skilled Nursing Facility Barriers to Discharge: Continued Medical Work up, English as a second language teacher  Expected Discharge Plan and Services       Living arrangements for the past 2 months: Single Family Home                                       Social Determinants of Health (SDOH) Interventions SDOH Screenings   Food Insecurity: Food Insecurity Present (10/23/2022)  Housing: High Risk (10/24/2022)  Transportation Needs: No Transportation Needs (10/24/2022)  Utilities: At Risk (10/24/2022)  Depression (PHQ2-9): High Risk (10/06/2021)  Financial Resource Strain: High Risk (05/22/2021)  Stress: Stress Concern Present (05/15/2021)  Tobacco Use: Medium Risk (10/23/2022)    Readmission Risk Interventions     No data to display

## 2022-10-29 NOTE — Assessment & Plan Note (Signed)
Blood glucose stable around 300s. Will monitor today with meals and consider slight adjustments to his regimen as appropriate. -Semglee 50 BID -Novolog 15u TID WC -rSSI + HS coverage -q4h CBG -AM BMP

## 2022-10-29 NOTE — Assessment & Plan Note (Signed)
No evidence of A-fib at present.  On Eliquis and metoprolol at home. -Continue home Eliquis and metoprolol 

## 2022-10-29 NOTE — Assessment & Plan Note (Signed)
Stable. Fairly impressive scrotal edema on admission.  Tender to palpation, and reports history of discharge from the scrotum. Of note, does have a history of scrotal cyst excision in 2022. Scrotal US shows large left hydrocele and bilateral epididymal head cyst, no signs of abscess.  -CTM -Will likely need outpatient urology 

## 2022-10-29 NOTE — Assessment & Plan Note (Signed)
Intertrigo of the groin. Candidal in appearance. Likely exacerbated by increased UOP in the setting of hyperglycemia. - Nystatin powder TID 

## 2022-10-29 NOTE — Assessment & Plan Note (Signed)
Status post pacemaker in 2022. HR stable overnight. -Continue home metoprolol succinate 25 mg daily 

## 2022-10-29 NOTE — Assessment & Plan Note (Signed)
Duloxetine increased to 120mg  daily due to worsening depression. Mood improving during his stay. -Appreciate psychiatry -Would benefit from therapy outpatient

## 2022-10-29 NOTE — Progress Notes (Signed)
Daily Progress Note Intern Pager: [phone]  Patient name: SAVON VONBARGEN Medical record number: [id number] Date of birth: 12-20-1965 Age: 57 y.o. Gender: male  Primary Care Provider: Sharmon Revere, MD Consultants: psychiatry Code Status: DNR   Pt Overview and Major Events to Date:  6/1 admitted   Assessment and Plan:   joshuajames aydelott is a 57 yo male presenting wih SOB, cough and sputum production, thought to 2/2 to asthma exacerbation +/- CAP Also with significant hyperglycemia.    He has a PMH significant for Tachy-Brady syndrome s/p pacemaker, HFpEF, A Fib on Eliquis, T2DM, CKD 3a  Active Hospital Problems   *Shortness of breath           Likely asthma exacerbation +/- CAP. CXR no focal           consolidation, afebrile,            SORA. Lungs clear this morning. Euvolemic.            PFTs in Jan 2023 with FEV1/FVC 99% of predicted           suggesting against COPD.            - completed antibiotic and steroid course           - Duonebs q6h PRN           - Start maintenance ICS (Arnuity 100 1 puff qd)           upon discharge, appreciate            pharmacy assistance           - Smoking cessation encouraged           - supplement O2 as needed           - PT/OT    Type II diabetes mellitus (HCC)           Blood glucose stable around 300s. Will monitor           today with meals and consider slight adjustments           to his regimen as appropriate.           -Semglee 50 BID           -Novolog 15u TID WC           -rSSI + HS coverage           -q4h CBG           -AM BMP    Scrotal swelling           Stable. Fairly impressive scrotal edema on           admission.  Tender to palpation, and reports           history of discharge from the scrotum. Of note,           does have a history of scrotal cyst excision in           2022. Scrotal US shows large left hydrocele and           bilateral epididymal head cyst, no signs of            abscess.             -CTM           -Will likely need outpatient urology    Severe depression (HCC)        Chronic  Duloxetine increased to 120mg  daily due to           worsening depression. Mood improving during his           stay.           -Appreciate psychiatry           -Would benefit from therapy outpatient    Intertrigo           Intertrigo of the groin. Candidal in appearance.           Likely exacerbated by increased UOP in the setting           of hyperglycemia.           - Nystatin powder TID    (HFpEF) heart failure with preserved ejection fraction (HCC)        Chronic           Euvolemic. Ongoing cocaine use, last use about 1wk           prior to admission.           -Home lasix 40mg  qd           -Daily weights, strict I/O           -Cocaine cessation    Tachycardia-bradycardia syndrome (HCC)        Chronic           Status post pacemaker in 2022. HR stable           overnight.           -Continue home metoprolol succinate 25 mg daily    PAF (paroxysmal atrial fibrillation) (HCC)           No evidence of A-fib at present.  On Eliquis and           metoprolol at home.           -Continue home Eliquis and metoprolol    Stage 3a chronic kidney disease (CKD) (HCC)           Close to baseline Cr           -Continue ARB           -Caution with nephrotoxic meds    Housing instability after recent homelessness           Recently homeless.  Now staying with his sister.           - TOC for housing resources    Severe episode of recurrent major depressive disorder, without psychotic features (HCC)    CAP (community acquired pneumonia)    Substance abuse (HCC)           Patient using cocaine regularly, when he can           afford it. Report's last use            1 wk ago.            -TOC for substance use   Resolved Hospital Problems   Candida rash of groin        Date Resolved: 10/24/2022    Hyperglycemia        Date Resolved: 10/24/2022     FEN/GI: Carb  modified diet PPx: Eliquis Dispo:SNF pending clinical improvement . Barriers include continued management of hyperglycemia.   Subjective:  NAEON, feeling well today  I completed a peer to peer call with patient's insurance, MD Dr. Constance Goltz.  He advised that the patient's SNF placement should be filed  under his Medicaid rather than Medicare due to better coverage.  Patient would already technically meet Medicare discharge criteria from SNF so this option would be much more expensive for him.  Would be cheaper to file under Medicaid.  I provided an update to the social worker managing his placement.  Objective: Temp:  [97.7 F (36.5 C)-98.3 F (36.8 C)] 98.1 F (36.7 C) (06/07 0805) Pulse Rate:  [66-85] 73 (06/07 0805) Resp:  [17-18] 17 (06/07 0330) BP: (97-125)/(65-85) 122/74 (06/07 0805) SpO2:  [97 %-100 %] 100 % (06/07 0805) Weight:  [132.9 kg] 132.9 kg (06/07 0631) Physical Exam: General: NAD, sitting up in chair Cardiovascular: RRR Respiratory: CTAB, no wheezes appreciated Abdomen: Soft NT/ND Extremities: No significant edema  Laboratory: Most recent CBC Lab Results  Component Value Date   WBC 15.6 (H) 10/28/2022   HGB 12.1 (L) 10/28/2022   HCT 37.0 (L) 10/28/2022   MCV 96.6 10/28/2022   PLT 227 10/28/2022   Most recent BMP    Latest Ref Rng & Units 10/28/2022    9:04 AM  BMP  Glucose 70 - 99 mg/dL 161   BUN 6 - 20 mg/dL 50   Creatinine 0.96 - 1.24 mg/dL 0.45   Sodium 409 - 811 mmol/L 132   Potassium 3.5 - 5.1 mmol/L 4.4   Chloride 98 - 111 mmol/L 100   CO2 22 - 32 mmol/L 22   Calcium 8.9 - 10.3 mg/dL 9.2     Vonna Drafts, MD 10/29/2022, 8:17 AM  PGY-1, [facility] FPTS Intern pager: [phone], text pages welcome Secure chat group [facility] Teaching Service

## 2022-10-29 NOTE — Assessment & Plan Note (Signed)
Likely asthma exacerbation +/- CAP. CXR no focal consolidation, afebrile, SORA. Lungs clear this morning. Euvolemic.  PFTs in Jan 2023 with FEV1/FVC 99% of predicted suggesting against COPD.  - completed antibiotic and steroid course - Duonebs q6h PRN - Start maintenance ICS (Arnuity 100 1 puff qd) upon discharge, appreciate pharmacy assistance - Smoking cessation encouraged - supplement O2 as needed - PT/OT

## 2022-10-29 NOTE — Assessment & Plan Note (Signed)
Patient using cocaine regularly, when he can afford it. Report's last use 1 wk ago.  -TOC for substance use 

## 2022-10-29 NOTE — Assessment & Plan Note (Signed)
Recently homeless.  Now staying with his sister. - TOC for housing resources 

## 2022-10-29 NOTE — Assessment & Plan Note (Signed)
Close to baseline Cr -Continue ARB -Caution with nephrotoxic meds 

## 2022-10-29 NOTE — Plan of Care (Signed)
Problem: Education: Goal: Knowledge of disease or condition will improve Outcome: Progressing Goal: Knowledge of the prescribed therapeutic regimen will improve Outcome: Progressing Goal: Individualized Educational Video(s) Outcome: Progressing   Problem: Activity: Goal: Ability to tolerate increased activity will improve Outcome: Progressing Goal: Will verbalize the importance of balancing activity with adequate rest periods Outcome: Progressing   Problem: Respiratory: Goal: Ability to maintain a clear airway will improve Outcome: Progressing Goal: Levels of oxygenation will improve Outcome: Progressing Goal: Ability to maintain adequate ventilation will improve Outcome: Progressing   Problem: Education: Goal: Knowledge of General Education information will improve Description: Including pain rating scale, medication(s)/side effects and non-pharmacologic comfort measures Outcome: Progressing   Problem: Health Behavior/Discharge Planning: Goal: Ability to manage health-related needs will improve Outcome: Progressing   Problem: Clinical Measurements: Goal: Ability to maintain clinical measurements within normal limits will improve Outcome: Progressing Goal: Will remain free from infection Outcome: Progressing Goal: Diagnostic test results will improve Outcome: Progressing Goal: Respiratory complications will improve Outcome: Progressing Goal: Cardiovascular complication will be avoided Outcome: Progressing   Problem: Activity: Goal: Risk for activity intolerance will decrease Outcome: Progressing   Problem: Nutrition: Goal: Adequate nutrition will be maintained Outcome: Progressing   Problem: Coping: Goal: Level of anxiety will decrease Outcome: Progressing   Problem: Elimination: Goal: Will not experience complications related to bowel motility Outcome: Progressing Goal: Will not experience complications related to urinary retention Outcome: Progressing    Problem: Pain Managment: Goal: General experience of comfort will improve Outcome: Progressing   Problem: Safety: Goal: Ability to remain free from injury will improve Outcome: Progressing   Problem: Skin Integrity: Goal: Risk for impaired skin integrity will decrease Outcome: Progressing   Problem: Education: Goal: Ability to describe self-care measures that may prevent or decrease complications (Diabetes Survival Skills Education) will improve Outcome: Progressing Goal: Individualized Educational Video(s) Outcome: Progressing   Problem: Coping: Goal: Ability to adjust to condition or change in health will improve Outcome: Progressing   Problem: Fluid Volume: Goal: Ability to maintain a balanced intake and output will improve Outcome: Progressing   Problem: Health Behavior/Discharge Planning: Goal: Ability to identify and utilize available resources and services will improve Outcome: Progressing Goal: Ability to manage health-related needs will improve Outcome: Progressing   Problem: Metabolic: Goal: Ability to maintain appropriate glucose levels will improve Outcome: Progressing   Problem: Nutritional: Goal: Maintenance of adequate nutrition will improve Outcome: Progressing Goal: Progress toward achieving an optimal weight will improve Outcome: Progressing   Problem: Skin Integrity: Goal: Risk for impaired skin integrity will decrease Outcome: Progressing   Problem: Tissue Perfusion: Goal: Adequacy of tissue perfusion will improve Outcome: Progressing   Problem: Education: Goal: Ability to describe self-care measures that may prevent or decrease complications (Diabetes Survival Skills Education) will improve Outcome: Progressing Goal: Individualized Educational Video(s) Outcome: Progressing   Problem: Cardiac: Goal: Ability to maintain an adequate cardiac output will improve Outcome: Progressing   Problem: Health Behavior/Discharge Planning: Goal:  Ability to identify and utilize available resources and services will improve Outcome: Progressing Goal: Ability to manage health-related needs will improve Outcome: Progressing   Problem: Fluid Volume: Goal: Ability to achieve a balanced intake and output will improve Outcome: Progressing   Problem: Metabolic: Goal: Ability to maintain appropriate glucose levels will improve Outcome: Progressing   Problem: Nutritional: Goal: Maintenance of adequate nutrition will improve Outcome: Progressing Goal: Maintenance of adequate weight for body size and type will improve Outcome: Progressing   Problem: Respiratory: Goal: Will regain and/or maintain adequate ventilation   Outcome: Progressing   Problem: Urinary Elimination: Goal: Ability to achieve and maintain adequate renal perfusion and functioning will improve Outcome: Progressing   

## 2022-10-30 DIAGNOSIS — J441 Chronic obstructive pulmonary disease with (acute) exacerbation: Secondary | ICD-10-CM

## 2022-10-30 DIAGNOSIS — R0602 Shortness of breath: Secondary | ICD-10-CM | POA: Diagnosis not present

## 2022-10-30 DIAGNOSIS — B3789 Other sites of candidiasis: Secondary | ICD-10-CM | POA: Diagnosis not present

## 2022-10-30 DIAGNOSIS — R739 Hyperglycemia, unspecified: Secondary | ICD-10-CM | POA: Diagnosis not present

## 2022-10-30 LAB — GLUCOSE, CAPILLARY
Glucose-Capillary: 226 mg/dL — ABNORMAL HIGH (ref 70–99)
Glucose-Capillary: 206 mg/dL — ABNORMAL HIGH (ref 70–99)
Glucose-Capillary: 154 mg/dL — ABNORMAL HIGH (ref 70–99)
Glucose-Capillary: 95 mg/dL (ref 70–99)
Glucose-Capillary: 134 mg/dL — ABNORMAL HIGH (ref 70–99)

## 2022-10-30 LAB — BASIC METABOLIC PANEL
Anion gap: 11 (ref 5–15)
BUN: 56 mg/dL — ABNORMAL HIGH (ref 6–20)
CO2: 23 mmol/L (ref 22–32)
Calcium: 8.6 mg/dL — ABNORMAL LOW (ref 8.9–10.3)
Chloride: 100 mmol/L (ref 98–111)
Creatinine, Ser: 1.39 mg/dL — ABNORMAL HIGH (ref 0.61–1.24)
GFR, Estimated: 59 mL/min — ABNORMAL LOW (ref >60)
Glucose, Bld: 236 mg/dL — ABNORMAL HIGH (ref 70–99)
Potassium: 4 mmol/L (ref 3.5–5.1)
Sodium: 134 mmol/L — ABNORMAL LOW (ref 135–145)

## 2022-10-30 MED ORDER — INSULIN GLARGINE-YFGN 100 UNIT/ML ~~LOC~~ SOLN
25.0000 [IU] | Freq: Every day | SUBCUTANEOUS | Status: DC
  Administered 2022-10-31 – 2022-11-03 (×4): 25 [IU] via SUBCUTANEOUS
  Filled 2022-10-30 (×4): qty 0.25

## 2022-10-30 NOTE — Assessment & Plan Note (Signed)
No evidence of A-fib at present. Rates in 70s. -Continue home Eliquis and metoprolol

## 2022-10-30 NOTE — Assessment & Plan Note (Signed)
Status post pacemaker in 2022. HR stable overnight. -Continue home metoprolol succinate 25 mg daily 

## 2022-10-30 NOTE — Plan of Care (Signed)
Problem: Education: Goal: Knowledge of disease or condition will improve Outcome: Progressing Goal: Knowledge of the prescribed therapeutic regimen will improve Outcome: Progressing Goal: Individualized Educational Video(s) Outcome: Progressing   Problem: Activity: Goal: Ability to tolerate increased activity will improve Outcome: Progressing Goal: Will verbalize the importance of balancing activity with adequate rest periods Outcome: Progressing   Problem: Respiratory: Goal: Ability to maintain a clear airway will improve Outcome: Progressing Goal: Levels of oxygenation will improve Outcome: Progressing Goal: Ability to maintain adequate ventilation will improve Outcome: Progressing   Problem: Education: Goal: Knowledge of General Education information will improve Description: Including pain rating scale, medication(s)/side effects and non-pharmacologic comfort measures Outcome: Progressing   Problem: Health Behavior/Discharge Planning: Goal: Ability to manage health-related needs will improve Outcome: Progressing   Problem: Clinical Measurements: Goal: Ability to maintain clinical measurements within normal limits will improve Outcome: Progressing Goal: Will remain free from infection Outcome: Progressing Goal: Diagnostic test results will improve Outcome: Progressing Goal: Respiratory complications will improve Outcome: Progressing Goal: Cardiovascular complication will be avoided Outcome: Progressing   Problem: Activity: Goal: Risk for activity intolerance will decrease Outcome: Progressing   Problem: Nutrition: Goal: Adequate nutrition will be maintained Outcome: Progressing   Problem: Coping: Goal: Level of anxiety will decrease Outcome: Progressing   Problem: Elimination: Goal: Will not experience complications related to bowel motility Outcome: Progressing Goal: Will not experience complications related to urinary retention Outcome: Progressing    Problem: Pain Managment: Goal: General experience of comfort will improve Outcome: Progressing   Problem: Safety: Goal: Ability to remain free from injury will improve Outcome: Progressing   Problem: Skin Integrity: Goal: Risk for impaired skin integrity will decrease Outcome: Progressing   Problem: Education: Goal: Ability to describe self-care measures that may prevent or decrease complications (Diabetes Survival Skills Education) will improve Outcome: Progressing Goal: Individualized Educational Video(s) Outcome: Progressing   Problem: Coping: Goal: Ability to adjust to condition or change in health will improve Outcome: Progressing   Problem: Fluid Volume: Goal: Ability to maintain a balanced intake and output will improve Outcome: Progressing   Problem: Health Behavior/Discharge Planning: Goal: Ability to identify and utilize available resources and services will improve Outcome: Progressing Goal: Ability to manage health-related needs will improve Outcome: Progressing   Problem: Metabolic: Goal: Ability to maintain appropriate glucose levels will improve Outcome: Progressing   Problem: Nutritional: Goal: Maintenance of adequate nutrition will improve Outcome: Progressing Goal: Progress toward achieving an optimal weight will improve Outcome: Progressing   Problem: Skin Integrity: Goal: Risk for impaired skin integrity will decrease Outcome: Progressing   Problem: Tissue Perfusion: Goal: Adequacy of tissue perfusion will improve Outcome: Progressing   Problem: Education: Goal: Ability to describe self-care measures that may prevent or decrease complications (Diabetes Survival Skills Education) will improve Outcome: Progressing Goal: Individualized Educational Video(s) Outcome: Progressing   Problem: Cardiac: Goal: Ability to maintain an adequate cardiac output will improve Outcome: Progressing   Problem: Health Behavior/Discharge Planning: Goal:  Ability to identify and utilize available resources and services will improve Outcome: Progressing Goal: Ability to manage health-related needs will improve Outcome: Progressing   Problem: Fluid Volume: Goal: Ability to achieve a balanced intake and output will improve Outcome: Progressing   Problem: Metabolic: Goal: Ability to maintain appropriate glucose levels will improve Outcome: Progressing   Problem: Nutritional: Goal: Maintenance of adequate nutrition will improve Outcome: Progressing Goal: Maintenance of adequate weight for body size and type will improve Outcome: Progressing   Problem: Respiratory: Goal: Will regain and/or maintain adequate ventilation  Outcome: Progressing   Problem: Urinary Elimination: Goal: Ability to achieve and maintain adequate renal perfusion and functioning will improve Outcome: Progressing

## 2022-10-30 NOTE — Assessment & Plan Note (Signed)
Recently homeless.  Now staying with his sister. - TOC for housing resources - Pt appealing SNF denial

## 2022-10-30 NOTE — Assessment & Plan Note (Signed)
Euvolemic. Ongoing cocaine use, last use about 1wk prior to admission. -Home lasix 40mg  qd -Daily weights, strict I/O -Counsel on cocaine cessation

## 2022-10-30 NOTE — Assessment & Plan Note (Signed)
Previous labs close to baseline. -Continue ARB -Caution with nephrotoxic meds

## 2022-10-30 NOTE — Assessment & Plan Note (Signed)
Mood improving during his stay. -Psychiatry signed off-continue Cymbalta 120 mg daily -Would benefit from therapy outpatient

## 2022-10-30 NOTE — Assessment & Plan Note (Addendum)
Blood glucose downtrending off of steroids CBG [id number] -Semglee 50 BID -Novolog 15u TID WC -rSSI + HS coverage -q4h CBG -AM BMP

## 2022-10-30 NOTE — Assessment & Plan Note (Signed)
Intertrigo of the groin. Candidal in appearance. Likely exacerbated by increased UOP in the setting of hyperglycemia. - Nystatin powder TID 

## 2022-10-30 NOTE — Assessment & Plan Note (Addendum)
Likely asthma exacerbation +/- CAP. CXR no focal consolidation. Afebrile, lungs CTAB, doing well on room air. PFTs in Jan 2023 with FEV1/FVC 99% of predicted suggesting against COPD.  - completed antibiotic and steroid course - Duonebs q6h PRN - Start maintenance ICS (Arnuity 100 1 puff qd) upon discharge, appreciate pharmacy assistance - Smoking cessation encouraged - supplement O2 as needed - PT/OT

## 2022-10-30 NOTE — Progress Notes (Signed)
Patient CBG 95, per MD, if patient consumes 50% or more of meal patient should still have 15units of novolog for meal coverage.

## 2022-10-30 NOTE — Progress Notes (Addendum)
Daily Progress Note Intern Pager: [phone]  Patient name: Allen Gould Medical record number: [id number] Date of birth: 1965-10-08 Age: 57 y.o. Gender: male  Primary Care Provider: Sharmon Revere, MD Consultants: Psych (s/o) Code Status: DNR  Pt Overview and Major Events to Date:  6/1 admitted  6/2 IV Insulin (endotool) started for hyperglycemia  6/3 Transitioned of endotool 6/8 Completed steroids/abx  Assessment and Plan: Allen Gould is a 57 yo male presenting wih SOB, cough and sputum production, thought to 2/2 to asthma exacerbation +/- CAP Also with significant hyperglycemia.    He has a PMH significant for Tachy-Brady syndrome s/p pacemaker, HFpEF, A Fib on Eliquis, T2DM, CKD 3a   Active Hospital Problems   *Shortness of breath           Likely asthma exacerbation +/- CAP. CXR no focal           consolidation. Afebrile, lungs CTAB, doing well on           room air.PFTs in Jan 2023 with FEV1/FVC 99% of           predicted suggesting against COPD.            - completed antibiotic and steroid course           - Duonebs q6h PRN           - Start maintenance ICS (Arnuity 100 1 puff qd)           upon discharge, appreciate            pharmacy assistance           - Smoking cessation encouraged           - supplement O2 as needed           - PT/OT    Type II diabetes mellitus (HCC)           Blood glucose downtrending off of steroids CBG           [phone]. Continue current regimen given downtrend.           -Semglee 50 BID           -Novolog 15u TID WC           -rSSI + HS coverage           -AC/HS CBG           -AM BMP    Scrotal swelling           Fairly impressive scrotal edema on admission. Hx           of tenderness to palpation, and reported history           of discharge from the scrotum. Of note, does have           a history of scrotal cyst excision in 2022.           Scrotal US shows large left hydrocele and           bilateral epididymal head  cyst, no signs of            abscess.            -CTM           -Will likely need outpatient urology    Severe depression (HCC)        Chronic           Mood improving during his stay.-Psychiatry signed  off-continue Cymbalta 120 mg daily           -Would benefit from therapy outpatient    Intertrigo           Intertrigo of the groin. Candidal in appearance.           Likely exacerbated by increased UOP in the setting           of hyperglycemia.           - Nystatin powder TID    (HFpEF) heart failure with preserved ejection fraction (HCC)        Chronic           Euvolemic. Ongoing cocaine use, last use about 1wk           prior to admission.           -Home lasix 40mg  qd           -Daily weights, strict I/O           -Counsel on cocaine cessation    Tachycardia-bradycardia syndrome (HCC)        Chronic           Status post pacemaker in 2022. HR stable           overnight.           -Continue home metoprolol succinate 25 mg daily    PAF (paroxysmal atrial fibrillation) (HCC)           No evidence of A-fib at present. Rates in 70s.           -Continue home Eliquis and metoprolol    Stage 3a chronic kidney disease (CKD) (HCC)           Previous labs close to baseline.           -Continue ARB           -Caution with nephrotoxic meds    Housing instability after recent homelessness           Recently homeless.  Now staying with his sister.           - TOC for housing resources           - Pt appealing SNF denial    COPD exacerbation (HCC)    Severe episode of recurrent major depressive disorder, without psychotic features (HCC)    CAP (community acquired pneumonia)    Substance abuse (HCC)   Resolved Hospital Problems   Candida rash of groin        Date Resolved: 10/24/2022    Hyperglycemia        Date Resolved: 10/24/2022   FEN/GI: Carb modified PPx: Eliquis 5mg  BID Dispo: SNF denial-patient appealing currently, awaiting [employer]  response  Subjective:  Patient states he has been doing well in terms of breathing-denies any shortness of breath and feels much improved.  Denies any issues overnight.  Objective: Temp:  [97.8 F (36.6 C)-98.6 F (37 C)] 97.8 F (36.6 C) (06/08 0314) Pulse Rate:  [60-74] 60 (06/08 0314) Resp:  [17-18] 18 (06/08 0314) BP: (92-122)/(58-81) 104/73 (06/08 0314) SpO2:  [97 %-100 %] 98 % (06/08 0314) Weight:  [132.9 kg] 132.9 kg (06/07 0631) Physical Exam: General: NAD, awake, alert, responsive to all questions Cardiovascular: RRR, no murmurs/rubs Respiratory: Clear to auscultation bilaterally, no wheezes rales or crackles, no increased work of breathing on room air Abdomen: Soft, nontender to palpation, nondistended Extremities: No lower extremity edema  Laboratory: Most recent CBC Lab Results  Component Value Date   WBC 15.6 (H) 10/28/2022   HGB 12.1 (L) 10/28/2022   HCT 37.0 (L) 10/28/2022   MCV 96.6 10/28/2022   PLT 227 10/28/2022   Most recent BMP    Latest Ref Rng & Units 10/30/2022    3:23 AM  BMP  Glucose 70 - 99 mg/dL 161   BUN 6 - 20 mg/dL 56   Creatinine 0.96 - 1.24 mg/dL 0.45   Sodium 409 - 811 mmol/L 134   Potassium 3.5 - 5.1 mmol/L 4.0   Chloride 98 - 111 mmol/L 100   CO2 22 - 32 mmol/L 23   Calcium 8.9 - 10.3 mg/dL 8.6     Levin Erp, MD 10/30/2022, 5:43 AM  PGY-2, [facility] FPTS Intern pager: [phone], text pages welcome Secure chat group [facility] Teaching Service

## 2022-10-30 NOTE — Assessment & Plan Note (Signed)
Fairly impressive scrotal edema on admission. Hx of tenderness to palpation, and reported history of discharge from the scrotum. Of note, does have a history of scrotal cyst excision in 2022. Scrotal US shows large left hydrocele and bilateral epididymal head cyst, no signs of abscess.  -CTM -Will likely need outpatient urology

## 2022-10-31 ENCOUNTER — Inpatient Hospital Stay (HOSPITAL_COMMUNITY): Payer: Medicare HMO

## 2022-10-31 DIAGNOSIS — J441 Chronic obstructive pulmonary disease with (acute) exacerbation: Secondary | ICD-10-CM | POA: Diagnosis not present

## 2022-10-31 DIAGNOSIS — N1831 Chronic kidney disease, stage 3a: Secondary | ICD-10-CM | POA: Diagnosis not present

## 2022-10-31 DIAGNOSIS — I480 Paroxysmal atrial fibrillation: Secondary | ICD-10-CM

## 2022-10-31 DIAGNOSIS — E1141 Type 2 diabetes mellitus with diabetic mononeuropathy: Secondary | ICD-10-CM | POA: Diagnosis not present

## 2022-10-31 DIAGNOSIS — Z794 Long term (current) use of insulin: Secondary | ICD-10-CM

## 2022-10-31 LAB — GLUCOSE, CAPILLARY
Glucose-Capillary: 138 mg/dL — ABNORMAL HIGH (ref 70–99)
Glucose-Capillary: 161 mg/dL — ABNORMAL HIGH (ref 70–99)
Glucose-Capillary: 160 mg/dL — ABNORMAL HIGH (ref 70–99)
Glucose-Capillary: 229 mg/dL — ABNORMAL HIGH (ref 70–99)
Glucose-Capillary: 75 mg/dL (ref 70–99)
Glucose-Capillary: 93 mg/dL (ref 70–99)
Glucose-Capillary: 196 mg/dL — ABNORMAL HIGH (ref 70–99)

## 2022-10-31 LAB — BASIC METABOLIC PANEL
Anion gap: 11 (ref 5–15)
BUN: 55 mg/dL — ABNORMAL HIGH (ref 6–20)
CO2: 25 mmol/L (ref 22–32)
Calcium: 9 mg/dL (ref 8.9–10.3)
Chloride: 99 mmol/L (ref 98–111)
Creatinine, Ser: 1.58 mg/dL — ABNORMAL HIGH (ref 0.61–1.24)
GFR, Estimated: 50 mL/min — ABNORMAL LOW (ref >60)
Glucose, Bld: 163 mg/dL — ABNORMAL HIGH (ref 70–99)
Potassium: 4.5 mmol/L (ref 3.5–5.1)
Sodium: 135 mmol/L (ref 135–145)

## 2022-10-31 NOTE — Assessment & Plan Note (Signed)
Mood improving during his stay. -Psychiatry signed off-continue Cymbalta 120 mg daily -Would benefit from therapy outpatient

## 2022-10-31 NOTE — Assessment & Plan Note (Signed)
Fairly impressive scrotal edema on admission. Hx of tenderness to palpation, and reported history of discharge from the scrotum. Of note, does have a history of scrotal cyst excision in 2022. Scrotal US shows large left hydrocele and bilateral epididymal head cyst, no signs of abscess.  -CTM -Will likely need outpatient urology

## 2022-10-31 NOTE — Assessment & Plan Note (Signed)
Blood glucose downtrending off of steroids, CBGs in 100s -Semglee decreased to 25u daily -Novolog 15u TID WC -rSSI + HS coverage -QID CBG -AM BMP

## 2022-10-31 NOTE — Progress Notes (Signed)
Daily Progress Note Intern Pager: [phone]  Patient name: Allen Gould Medical record number: [id number] Date of birth: 1965-10-28 Age: 57 y.o. Gender: male  Primary Care Provider: Sharmon Revere, MD Consultants: psych (signed off) Code Status: DNR  Pt Overview and Major Events to Date:  6/1 admitted  6/2 IV Insulin (endotool) started for hyperglycemia  6/3 Transitioned of endotool 6/8 Completed steroids/abx  Assessment and Plan:  Allen Gould is a 57 yo male presenting wih SOB, cough and sputum production, thought to 2/2 to asthma exacerbation +/- CAP Also with significant hyperglycemia.    He has a PMH significant for Tachy-Brady syndrome s/p pacemaker, HFpEF, A Fib on Eliquis, T2DM, CKD 3a  Active Hospital Problems   *Shortness of breath           Likely asthma exacerbation +/- CAP. CXR no focal           consolidation. Afebrile, lungs CTAB, doing well on           room air.PFTs in Jan 2023 with FEV1/FVC 99% of           predicted suggesting against COPD.            - completed antibiotic and steroid course           - Duonebs q6h PRN           - Start maintenance ICS (Arnuity 100 1 puff qd)           upon discharge, appreciate            pharmacy assistance           - Smoking cessation encouraged           - supplement O2 as needed           - PT/OT    Type II diabetes mellitus (HCC)           Blood glucose downtrending off of steroids, CBGs           in 100s           -Semglee decreased to 25u daily           -Novolog 15u TID WC           -rSSI + HS coverage           -QID CBG           -AM BMP    Scrotal swelling           Fairly impressive scrotal edema on admission. Hx           of tenderness to palpation, and reported history           of discharge from the scrotum. Of note, does have           a history of scrotal cyst excision in 2022.           Scrotal US shows large left hydrocele and           bilateral epididymal head cyst, no signs of             abscess.            -CTM           -Will likely need outpatient urology    Severe depression (HCC)        Chronic           Mood improving during his stay.-Psychiatry signed  off-continue Cymbalta 120 mg daily           -Would benefit from therapy outpatient    Intertrigo           Intertrigo of the groin. Candidal in appearance.           Likely exacerbated by increased UOP in the setting           of hyperglycemia.           - Nystatin powder TID    (HFpEF) heart failure with preserved ejection fraction (HCC)        Chronic           Euvolemic. Ongoing cocaine use, last use about 1wk           prior to admission.           -Home lasix 40mg  qd           -Daily weights, strict I/O           -Counsel on cocaine cessation    Tachycardia-bradycardia syndrome (HCC)        Chronic           Status post pacemaker in 2022. HR stable           overnight.           -Continue home metoprolol succinate 25 mg daily    PAF (paroxysmal atrial fibrillation) (HCC)           No evidence of A-fib at present. Rates in 70s.           -Continue home Eliquis and metoprolol    Stage 3a chronic kidney disease (CKD) (HCC)           Previous labs close to baseline.           -Continue ARB           -Caution with nephrotoxic meds    Housing instability after recent homelessness           Recently homeless.  Now staying with his sister.           - TOC for housing resources           - Pt appealing SNF denial    COPD exacerbation (HCC)    Severe episode of recurrent major depressive disorder, without psychotic features (HCC)    CAP (community acquired pneumonia)    Substance abuse (HCC)           Patient using cocaine regularly, when he can           afford it. Report's last use            1 wk ago.            -TOC for substance use   Resolved Hospital Problems   Candida rash of groin        Date Resolved: 10/24/2022    Hyperglycemia        Date Resolved:  10/24/2022     FEN/GI: carb modified PPx: eliquis Dispo:SNF pending placement/appeal and continued management as above  Subjective:  NAEON, breathing well. Denies concerns aside from some mild L knee pain. States he will try to compress it and walk it off.   Objective: Temp:  [97.7 F (36.5 C)-99 F (37.2 C)] 98.9 F (37.2 C) (06/09 0727) Pulse Rate:  [65-77] 65 (06/09 0727) Resp:  [16-18] 18 (06/09 0727) BP: (93-118)/(71-86) 117/79 (06/09 0727) SpO2:  [97 %-100 %] 97 % (  06/09 0727) Weight:  [132.6 kg] 132.6 kg (06/09 0647) Physical Exam: General: NAD, sitting up in chair, pleasant Cardiovascular: RRR no murmur Respiratory: CTAB normal WOB on RA Abdomen: soft NTND Extremities: no significant edema. L knee does not appear swollen/discolored, no effusion noted. Mild tenderness over lateral joint line  Laboratory: Most recent CBC Lab Results  Component Value Date   WBC 15.6 (H) 10/28/2022   HGB 12.1 (L) 10/28/2022   HCT 37.0 (L) 10/28/2022   MCV 96.6 10/28/2022   PLT 227 10/28/2022   Most recent BMP    Latest Ref Rng & Units 10/31/2022    4:46 AM  BMP  Glucose 70 - 99 mg/dL 829   BUN 6 - 20 mg/dL 55   Creatinine 5.62 - 1.24 mg/dL 1.30   Sodium 865 - 784 mmol/L 135   Potassium 3.5 - 5.1 mmol/L 4.5   Chloride 98 - 111 mmol/L 99   CO2 22 - 32 mmol/L 25   Calcium 8.9 - 10.3 mg/dL 9.0      Vonna Drafts, MD 10/31/2022, 11:04 AM  PGY-1, [facility] FPTS Intern pager: [phone], text pages welcome Secure chat group [facility] Teaching Service

## 2022-10-31 NOTE — Assessment & Plan Note (Signed)
Previous labs close to baseline. -Continue ARB -Caution with nephrotoxic meds

## 2022-10-31 NOTE — Plan of Care (Signed)
Problem: Education: Goal: Knowledge of disease or condition will improve Outcome: Progressing Goal: Knowledge of the prescribed therapeutic regimen will improve Outcome: Progressing Goal: Individualized Educational Video(s) Outcome: Progressing   Problem: Activity: Goal: Ability to tolerate increased activity will improve Outcome: Progressing Goal: Will verbalize the importance of balancing activity with adequate rest periods Outcome: Progressing   Problem: Respiratory: Goal: Ability to maintain a clear airway will improve Outcome: Progressing Goal: Levels of oxygenation will improve Outcome: Progressing Goal: Ability to maintain adequate ventilation will improve Outcome: Progressing   Problem: Education: Goal: Knowledge of General Education information will improve Description: Including pain rating scale, medication(s)/side effects and non-pharmacologic comfort measures Outcome: Progressing   Problem: Health Behavior/Discharge Planning: Goal: Ability to manage health-related needs will improve Outcome: Progressing   Problem: Clinical Measurements: Goal: Ability to maintain clinical measurements within normal limits will improve Outcome: Progressing Goal: Will remain free from infection Outcome: Progressing Goal: Diagnostic test results will improve Outcome: Progressing Goal: Respiratory complications will improve Outcome: Progressing Goal: Cardiovascular complication will be avoided Outcome: Progressing   Problem: Activity: Goal: Risk for activity intolerance will decrease Outcome: Progressing   Problem: Nutrition: Goal: Adequate nutrition will be maintained Outcome: Progressing   Problem: Coping: Goal: Level of anxiety will decrease Outcome: Progressing   Problem: Elimination: Goal: Will not experience complications related to bowel motility Outcome: Progressing Goal: Will not experience complications related to urinary retention Outcome: Progressing    Problem: Pain Managment: Goal: General experience of comfort will improve Outcome: Progressing   Problem: Safety: Goal: Ability to remain free from injury will improve Outcome: Progressing   Problem: Skin Integrity: Goal: Risk for impaired skin integrity will decrease Outcome: Progressing   Problem: Education: Goal: Ability to describe self-care measures that may prevent or decrease complications (Diabetes Survival Skills Education) will improve Outcome: Progressing Goal: Individualized Educational Video(s) Outcome: Progressing   Problem: Coping: Goal: Ability to adjust to condition or change in health will improve Outcome: Progressing   Problem: Fluid Volume: Goal: Ability to maintain a balanced intake and output will improve Outcome: Progressing   Problem: Health Behavior/Discharge Planning: Goal: Ability to identify and utilize available resources and services will improve Outcome: Progressing Goal: Ability to manage health-related needs will improve Outcome: Progressing   Problem: Metabolic: Goal: Ability to maintain appropriate glucose levels will improve Outcome: Progressing   Problem: Nutritional: Goal: Maintenance of adequate nutrition will improve Outcome: Progressing Goal: Progress toward achieving an optimal weight will improve Outcome: Progressing   Problem: Skin Integrity: Goal: Risk for impaired skin integrity will decrease Outcome: Progressing   Problem: Tissue Perfusion: Goal: Adequacy of tissue perfusion will improve Outcome: Progressing   Problem: Education: Goal: Ability to describe self-care measures that may prevent or decrease complications (Diabetes Survival Skills Education) will improve Outcome: Progressing Goal: Individualized Educational Video(s) Outcome: Progressing   Problem: Cardiac: Goal: Ability to maintain an adequate cardiac output will improve Outcome: Progressing   Problem: Health Behavior/Discharge Planning: Goal:  Ability to identify and utilize available resources and services will improve Outcome: Progressing Goal: Ability to manage health-related needs will improve Outcome: Progressing   Problem: Fluid Volume: Goal: Ability to achieve a balanced intake and output will improve Outcome: Progressing   Problem: Metabolic: Goal: Ability to maintain appropriate glucose levels will improve Outcome: Progressing   Problem: Nutritional: Goal: Maintenance of adequate nutrition will improve Outcome: Progressing Goal: Maintenance of adequate weight for body size and type will improve Outcome: Progressing   Problem: Respiratory: Goal: Will regain and/or maintain adequate ventilation   Outcome: Progressing   Problem: Urinary Elimination: Goal: Ability to achieve and maintain adequate renal perfusion and functioning will improve Outcome: Progressing   

## 2022-10-31 NOTE — Assessment & Plan Note (Signed)
Recently homeless.  Now staying with his sister. - TOC for housing resources - Pt appealing SNF denial

## 2022-10-31 NOTE — Assessment & Plan Note (Signed)
Patient using cocaine regularly, when he can afford it. Report's last use 1 wk ago.  -TOC for substance use 

## 2022-10-31 NOTE — Assessment & Plan Note (Signed)
Intertrigo of the groin. Candidal in appearance. Likely exacerbated by increased UOP in the setting of hyperglycemia. - Nystatin powder TID 

## 2022-10-31 NOTE — Assessment & Plan Note (Signed)
No evidence of A-fib at present. Rates in 70s. -Continue home Eliquis and metoprolol

## 2022-10-31 NOTE — Assessment & Plan Note (Signed)
Euvolemic. Ongoing cocaine use, last use about 1wk prior to admission. -Home lasix 40mg  qd -Daily weights, strict I/O -Counsel on cocaine cessation

## 2022-10-31 NOTE — Assessment & Plan Note (Signed)
Likely asthma exacerbation +/- CAP. CXR no focal consolidation. Afebrile, lungs CTAB, doing well on room air. PFTs in Jan 2023 with FEV1/FVC 99% of predicted suggesting against COPD.  - completed antibiotic and steroid course - Duonebs q6h PRN - Start maintenance ICS (Arnuity 100 1 puff qd) upon discharge, appreciate pharmacy assistance - Smoking cessation encouraged - supplement O2 as needed - PT/OT

## 2022-10-31 NOTE — Assessment & Plan Note (Signed)
Status post pacemaker in 2022. HR stable overnight. -Continue home metoprolol succinate 25 mg daily 

## 2022-11-01 DIAGNOSIS — R0602 Shortness of breath: Secondary | ICD-10-CM | POA: Diagnosis not present

## 2022-11-01 LAB — GLUCOSE, CAPILLARY
Glucose-Capillary: 174 mg/dL — ABNORMAL HIGH (ref 70–99)
Glucose-Capillary: 159 mg/dL — ABNORMAL HIGH (ref 70–99)
Glucose-Capillary: 155 mg/dL — ABNORMAL HIGH (ref 70–99)
Glucose-Capillary: 157 mg/dL — ABNORMAL HIGH (ref 70–99)

## 2022-11-01 LAB — CBC
HCT: 35.3 % — ABNORMAL LOW (ref 39.0–52.0)
Hemoglobin: 11.1 g/dL — ABNORMAL LOW (ref 13.0–17.0)
MCH: 32.1 pg (ref 26.0–34.0)
MCHC: 31.4 g/dL (ref 30.0–36.0)
MCV: 102 fL — ABNORMAL HIGH (ref 80.0–100.0)
Platelets: 201 10*3/uL (ref 150–400)
RBC: 3.46 MIL/uL — ABNORMAL LOW (ref 4.22–5.81)
RDW: 13.3 % (ref 11.5–15.5)
WBC: 13.7 10*3/uL — ABNORMAL HIGH (ref 4.0–10.5)
nRBC: 0 % (ref 0.0–0.2)

## 2022-11-01 LAB — BASIC METABOLIC PANEL
Anion gap: 11 (ref 5–15)
BUN: 55 mg/dL — ABNORMAL HIGH (ref 6–20)
CO2: 20 mmol/L — ABNORMAL LOW (ref 22–32)
Calcium: 8.7 mg/dL — ABNORMAL LOW (ref 8.9–10.3)
Chloride: 101 mmol/L (ref 98–111)
Creatinine, Ser: 1.51 mg/dL — ABNORMAL HIGH (ref 0.61–1.24)
GFR, Estimated: 53 mL/min — ABNORMAL LOW (ref >60)
Glucose, Bld: 250 mg/dL — ABNORMAL HIGH (ref 70–99)
Potassium: 4.7 mmol/L (ref 3.5–5.1)
Sodium: 132 mmol/L — ABNORMAL LOW (ref 135–145)

## 2022-11-01 MED ORDER — BUDESONIDE 0.5 MG/2ML IN SUSP
0.5000 mg | Freq: Two times a day (BID) | RESPIRATORY_TRACT | Status: DC
  Administered 2022-11-01 – 2022-11-03 (×5): 0.5 mg via RESPIRATORY_TRACT
  Filled 2022-11-01 (×5): qty 2

## 2022-11-01 NOTE — Progress Notes (Signed)
Daily Progress Note Intern Pager: [phone]  Patient name: Allen Gould Medical record number: [id number] Date of birth: February 12, 1966 Age: 57 y.o. Gender: male  Primary Care Provider: Sharmon Revere, MD Consultants: psych (signed off) Code Status: DNR   Pt Overview and Major Events to Date:  6/1 admitted  6/2 IV Insulin (endotool) started for hyperglycemia  6/3 Transitioned of endotool 6/8 Completed steroids/abx   Assessment and Plan:   Allen Gould is a 57 yo male presenting wih SOB, cough and sputum production, thought to 2/2 to asthma exacerbation +/- CAP Also with significant hyperglycemia.    He has a PMH significant for Tachy-Brady syndrome s/p pacemaker, HFpEF, A Fib on Eliquis, T2DM, CKD 3a  Active Hospital Problems   *Shortness of breath           Likely asthma exacerbation +/- CAP. CXR no focal           consolidation. Afebrile, lungs CTAB, doing well on           room air.PFTs in Jan 2023 with FEV1/FVC 99% of           predicted suggesting against COPD.            - completed antibiotic and steroid course           - Duonebs q6h PRN           - Start pulmicort while inpatient           - Start maintenance ICS (Arnuity 100 1 puff qd)           upon discharge, appreciate            pharmacy assistance           - Smoking cessation encouraged           - supplement O2 as needed           - PT/OT    Type II diabetes mellitus (HCC)           Blood glucose downtrending off of steroids, CBGs           in 100s           -Semglee 25u daily           -Novolog 15u TID WC           -rSSI + HS coverage           -QID CBG           -AM BMP    Scrotal swelling           Fairly impressive scrotal edema on admission. Hx           of tenderness to palpation, and reported history           of discharge from the scrotum. Of note, does have           a history of scrotal cyst excision in 2022.           Scrotal US shows large left hydrocele and           bilateral  epididymal head cyst, no signs of            abscess.            -CTM           -Will likely need outpatient urology    Severe depression (HCC)        Chronic  Mood improving during his stay.-Psychiatry signed           off-continue Cymbalta 120 mg daily           -Would benefit from therapy outpatient    Intertrigo           Intertrigo of the groin. Candidal in appearance.           Likely exacerbated by increased UOP in the setting           of hyperglycemia.           - Nystatin powder TID    (HFpEF) heart failure with preserved ejection fraction (HCC)        Chronic           Euvolemic. Ongoing cocaine use, last use about 1wk           prior to admission.           -Home lasix 40mg  qd           -Daily weights, strict I/O           -Counsel on cocaine cessation    Tachycardia-bradycardia syndrome (HCC)        Chronic           Status post pacemaker in 2022. HR stable           overnight.           -Continue home metoprolol succinate 25 mg daily    PAF (paroxysmal atrial fibrillation) (HCC)           No evidence of A-fib at present. Rates in 70s.           -Continue home Eliquis and metoprolol    Stage 3a chronic kidney disease (CKD) (HCC)           Previous labs close to baseline.           -Continue ARB           -Caution with nephrotoxic meds    Housing instability after recent homelessness           Recently homeless.  Now staying with his sister.           - TOC for housing resources           - Pt appealing SNF denial    COPD exacerbation (HCC)    Severe episode of recurrent major depressive disorder, without psychotic features (HCC)    CAP (community acquired pneumonia)    Substance abuse (HCC)           Patient using cocaine regularly, when he can           afford it. Report's last use            1 wk ago.            -TOC for substance use   Resolved Hospital Problems   Candida rash of groin        Date Resolved: 10/24/2022     Hyperglycemia        Date Resolved: 10/24/2022     FEN/GI: carb modified PPx: eliquis Dispo:SNF pending placement/appeal   Subjective:  NAEON, denies concerns  Objective: Temp:  [98 F (36.7 C)-98.7 F (37.1 C)] 98.7 F (37.1 C) (06/10 0731) Pulse Rate:  [59-73] 65 (06/10 1154) Resp:  [16-18] 17 (06/10 1154) BP: (95-109)/(64-88) 100/69 (06/10 1107) SpO2:  [96 %-100 %] 96 % (06/10 1154) Physical Exam: General: NAD, sitting up  in chair Cardiovascular: RRR no murmur Respiratory: faint b/l expiratory wheezes, normal WOB on RA Abdomen: soft NTND Extremities: no significant edema  Laboratory: Most recent CBC Lab Results  Component Value Date   WBC 13.7 (H) 11/01/2022   HGB 11.1 (L) 11/01/2022   HCT 35.3 (L) 11/01/2022   MCV 102.0 (H) 11/01/2022   PLT 201 11/01/2022   Most recent BMP    Latest Ref Rng & Units 11/01/2022    7:56 AM  BMP  Glucose 70 - 99 mg/dL 098   BUN 6 - 20 mg/dL 55   Creatinine 1.19 - 1.24 mg/dL 1.47   Sodium 829 - 562 mmol/L 132   Potassium 3.5 - 5.1 mmol/L 4.7   Chloride 98 - 111 mmol/L 101   CO2 22 - 32 mmol/L 20   Calcium 8.9 - 10.3 mg/dL 8.7      Imaging/Diagnostic Tests:  DG Knee 1-2 Views Left  Result Date: 10/31/2022 CLINICAL DATA:  Left knee injury. EXAM: LEFT KNEE - 1-2 VIEW COMPARISON:  None Available. FINDINGS: No fracture or dislocation. The joint spaces are preserved. Trace patellofemoral spurring. No significant knee joint effusion. Small patellar tendon enthesophyte. No focal bone abnormality. There may be mild generalized soft tissue edema. IMPRESSION: 1. No acute fracture or dislocation. 2. Minimal patellofemoral spurring and small patellar tendon enthesophyte. Electronically Signed   By: Narda Rutherford M.D.   On: 10/31/2022 18:11    Vonna Drafts, MD 11/01/2022, 1:34 PM  PGY-1, [facility] FPTS Intern pager: [phone], text pages welcome Secure chat group [facility] Teaching Service

## 2022-11-01 NOTE — Assessment & Plan Note (Signed)
No evidence of A-fib at present. Rates in 70s. -Continue home Eliquis and metoprolol

## 2022-11-01 NOTE — Assessment & Plan Note (Signed)
Blood glucose downtrending off of steroids, CBGs in 100s -Semglee 25u daily -Novolog 15u TID WC -rSSI + HS coverage -QID CBG -AM BMP

## 2022-11-01 NOTE — Assessment & Plan Note (Signed)
Recently homeless.  Now staying with his sister. - TOC for housing resources - Pt appealing SNF denial

## 2022-11-01 NOTE — Assessment & Plan Note (Signed)
Euvolemic. Ongoing cocaine use, last use about 1wk prior to admission. -Home lasix 40mg  qd -Daily weights, strict I/O -Counsel on cocaine cessation

## 2022-11-01 NOTE — Progress Notes (Signed)
Physical Therapy Treatment Patient Details Name: Allen Gould MRN: [id number] DOB: 1966-04-04 Today's Date: 11/01/2022   History of Present Illness 57 y.o. male presents to [facility] hospital 10/23/22 with SOB, cough, admitted for management of likely CAP. PMH includes tachy-brady syndrome s/p pacemaker, HFpEF, afib, DMII, CKDIII.    PT Comments    Pt is highly motivated to make progress with functional mobility and reduce his bil hip and L knee pain. Pt pushing through ambulating x2 bouts of ~120-170 ft distances without a rest break each bout despite the pain, displaying improved endurance. He needs cues for upright posture and to relax his shoulder, but he heavily relies on his upper extremities to reduce his pain in standing. Educated pt on exercises he can perform within pt tolerance to improve muscle strength and joint stability to manage the pain. Will continue to follow acutely.     Recommendations for follow up therapy are one component of a multi-disciplinary discharge planning process, led by the attending physician.  Recommendations may be updated based on patient status, additional functional criteria and insurance authorization.  Follow Up Recommendations  Can patient physically be transported by private vehicle: Yes    Assistance Recommended at Discharge Intermittent Supervision/Assistance  Patient can return home with the following A little help with walking and/or transfers;A lot of help with bathing/dressing/bathroom;Assistance with cooking/housework;Assist for transportation;Help with stairs or ramp for entrance   Equipment Recommendations  BSC/3in1    Recommendations for Other Services       Precautions / Restrictions Precautions Precautions: Fall Restrictions Weight Bearing Restrictions: No     Mobility  Bed Mobility               General bed mobility comments: Pt standing in hall with RN upon arrival, left in recliner end of session.     Transfers Overall transfer level: Needs assistance Equipment used: Rolling walker (2 wheels) Transfers: Sit to/from Stand Sit to Stand: Min guard           General transfer comment: Extra time and effort to power up to stand from transport chair to RW, min guard for safety    Ambulation/Gait Ambulation/Gait assistance: Min guard Gait Distance (Feet): 170 Feet (x2 bouts of ~120 ft > ~170 ft) Assistive device: Rolling walker (2 wheels) Gait Pattern/deviations: Step-through pattern, Decreased stride length, Trunk flexed, Wide base of support Gait velocity: reduced Gait velocity interpretation: <1.31 ft/sec, indicative of household ambulator   General Gait Details: Pt with slow gait and heavy reliance on UEs, needing cues to relax shoulders with poor success. Cues to look up and improve upright posture, good success noted. Wide BOS throughout. No LOB, minguard for safety.   Stairs             Wheelchair Mobility    Modified Rankin (Stroke Patients Only)       Balance Overall balance assessment: Needs assistance Sitting-balance support: No upper extremity supported, Feet supported Sitting balance-Leahy Scale: Good     Standing balance support: Bilateral upper extremity supported, During functional activity, Reliant on assistive device for balance Standing balance-Leahy Scale: Poor Standing balance comment: Heavy reliance on RW to ambulate                            Cognition Arousal/Alertness: Awake/alert Behavior During Therapy: WFL for tasks assessed/performed Overall Cognitive Status: Within Functional Limits for tasks assessed  Exercises      General Comments General comments (skin integrity, edema, etc.): educated pt on AROM exercises to increase leg strength and stabilization of joints, he verbalized understanding      Pertinent Vitals/Pain Pain Assessment Pain Assessment:  Faces Faces Pain Scale: Hurts even more Pain Location: bil hips, L knee Pain Descriptors / Indicators: Aching, Discomfort, Grimacing Pain Intervention(s): Limited activity within patient's tolerance, Monitored during session, Repositioned    Home Living                          Prior Function            PT Goals (current goals can now be found in the care plan section) Acute Rehab PT Goals Patient Stated Goal: to reduce his pain PT Goal Formulation: With patient Time For Goal Achievement: 11/08/22 Potential to Achieve Goals: Fair Progress towards PT goals: Progressing toward goals    Frequency    Min 2X/week      PT Plan Current plan remains appropriate    Co-evaluation              AM-PAC PT "6 Clicks" Mobility   Outcome Measure  Help needed turning from your back to your side while in a flat bed without using bedrails?: A Little Help needed moving from lying on your back to sitting on the side of a flat bed without using bedrails?: A Little Help needed moving to and from a bed to a chair (including a wheelchair)?: A Little Help needed standing up from a chair using your arms (e.g., wheelchair or bedside chair)?: A Little Help needed to walk in hospital room?: A Little Help needed climbing 3-5 steps with a railing? : Total 6 Click Score: 16    End of Session   Activity Tolerance: Patient tolerated treatment well Patient left: in chair;with call bell/phone within reach   PT Visit Diagnosis: Other abnormalities of gait and mobility (R26.89);Muscle weakness (generalized) (M62.81);Pain;Unsteadiness on feet (R26.81);Difficulty in walking, not elsewhere classified (R26.2) Pain - Right/Left: Left (bilateral) Pain - part of body: Hip;Knee     Time: 1610-9604 PT Time Calculation (min) (ACUTE ONLY): 24 min  Charges:  $Gait Training: 8-22 mins $Therapeutic Exercise: 8-22 mins                     Raymond Gurney, PT, DPT Acute Rehabilitation  Services  Office: [phone]    Jewel Baize 11/01/2022, 3:01 PM

## 2022-11-01 NOTE — Progress Notes (Signed)
Orthopedic Tech Progress Note Patient Details:  Allen Gould [birth year] [id number]  Ortho Devices Type of Ortho Device: Knee Sleeve Ortho Device/Splint Location: LLE Ortho Device/Splint Interventions: Ordered, Application, Adjustment   Post Interventions Patient Tolerated: Well Instructions Provided: Care of device  [name] 11/01/2022, 6:39 PM

## 2022-11-01 NOTE — Assessment & Plan Note (Signed)
Mood improving during his stay. -Psychiatry signed off-continue Cymbalta 120 mg daily -Would benefit from therapy outpatient

## 2022-11-01 NOTE — Assessment & Plan Note (Signed)
Likely asthma exacerbation +/- CAP. CXR no focal consolidation. Afebrile, lungs CTAB, doing well on room air. PFTs in Jan 2023 with FEV1/FVC 99% of predicted suggesting against COPD.  - completed antibiotic and steroid course - Duonebs q6h PRN - Start pulmicort while inpatient - Start maintenance ICS (Arnuity 100 1 puff qd) upon discharge, appreciate pharmacy assistance - Smoking cessation encouraged - supplement O2 as needed - PT/OT

## 2022-11-01 NOTE — Assessment & Plan Note (Signed)
Intertrigo of the groin. Candidal in appearance. Likely exacerbated by increased UOP in the setting of hyperglycemia. - Nystatin powder TID 

## 2022-11-01 NOTE — Assessment & Plan Note (Signed)
Status post pacemaker in 2022. HR stable overnight. -Continue home metoprolol succinate 25 mg daily 

## 2022-11-01 NOTE — Assessment & Plan Note (Signed)
Patient using cocaine regularly, when he can afford it. Report's last use 1 wk ago.  -TOC for substance use 

## 2022-11-01 NOTE — Assessment & Plan Note (Signed)
Fairly impressive scrotal edema on admission. Hx of tenderness to palpation, and reported history of discharge from the scrotum. Of note, does have a history of scrotal cyst excision in 2022. Scrotal US shows large left hydrocele and bilateral epididymal head cyst, no signs of abscess.  -CTM -Will likely need outpatient urology

## 2022-11-01 NOTE — Assessment & Plan Note (Signed)
Previous labs close to baseline. -Continue ARB -Caution with nephrotoxic meds

## 2022-11-02 DIAGNOSIS — R0602 Shortness of breath: Secondary | ICD-10-CM | POA: Diagnosis not present

## 2022-11-02 LAB — GLUCOSE, CAPILLARY
Glucose-Capillary: 221 mg/dL — ABNORMAL HIGH (ref 70–99)
Glucose-Capillary: 237 mg/dL — ABNORMAL HIGH (ref 70–99)
Glucose-Capillary: 400 mg/dL — ABNORMAL HIGH (ref 70–99)
Glucose-Capillary: 434 mg/dL — ABNORMAL HIGH (ref 70–99)
Glucose-Capillary: 300 mg/dL — ABNORMAL HIGH (ref 70–99)

## 2022-11-02 LAB — BASIC METABOLIC PANEL
Anion gap: 7 (ref 5–15)
BUN: 53 mg/dL — ABNORMAL HIGH (ref 6–20)
CO2: 24 mmol/L (ref 22–32)
Calcium: 8.5 mg/dL — ABNORMAL LOW (ref 8.9–10.3)
Chloride: 102 mmol/L (ref 98–111)
Creatinine, Ser: 1.39 mg/dL — ABNORMAL HIGH (ref 0.61–1.24)
GFR, Estimated: 59 mL/min — ABNORMAL LOW (ref >60)
Glucose, Bld: 243 mg/dL — ABNORMAL HIGH (ref 70–99)
Potassium: 4.5 mmol/L (ref 3.5–5.1)
Sodium: 133 mmol/L — ABNORMAL LOW (ref 135–145)

## 2022-11-02 MED ORDER — INSULIN ASPART 100 UNIT/ML IJ SOLN
0.0000 [IU] | Freq: Three times a day (TID) | INTRAMUSCULAR | Status: DC
  Administered 2022-11-03: 8 [IU] via SUBCUTANEOUS
  Administered 2022-11-03: 3 [IU] via SUBCUTANEOUS

## 2022-11-02 MED ORDER — INSULIN ASPART 100 UNIT/ML IJ SOLN
0.0000 [IU] | Freq: Three times a day (TID) | INTRAMUSCULAR | Status: DC
  Administered 2022-11-02: 5 [IU] via SUBCUTANEOUS
  Administered 2022-11-02: 15 [IU] via SUBCUTANEOUS

## 2022-11-02 MED ORDER — INSULIN ASPART 100 UNIT/ML IJ SOLN
10.0000 [IU] | Freq: Three times a day (TID) | INTRAMUSCULAR | Status: DC
  Administered 2022-11-02 – 2022-11-03 (×4): 10 [IU] via SUBCUTANEOUS

## 2022-11-02 MED ORDER — INSULIN ASPART 100 UNIT/ML IJ SOLN
10.0000 [IU] | Freq: Once | INTRAMUSCULAR | Status: AC
  Administered 2022-11-02: 10 [IU] via SUBCUTANEOUS

## 2022-11-02 MED ORDER — INSULIN ASPART 100 UNIT/ML IJ SOLN
0.0000 [IU] | Freq: Every day | INTRAMUSCULAR | Status: DC
  Administered 2022-11-02: 3 [IU] via SUBCUTANEOUS

## 2022-11-02 NOTE — Assessment & Plan Note (Signed)
Status post pacemaker in 2022. HR stable overnight. -Continue home metoprolol succinate 25 mg daily 

## 2022-11-02 NOTE — Assessment & Plan Note (Signed)
Recently homeless.  Now staying with his sister. - TOC for housing resources - Pt appealing SNF denial

## 2022-11-02 NOTE — Assessment & Plan Note (Addendum)
Stable -Continue ARB -Caution with nephrotoxic meds

## 2022-11-02 NOTE — Progress Notes (Signed)
Occupational Therapy Treatment Patient Details Name: Allen Gould MRN: [id number] DOB: 07/06/1965 Today's Date: 11/02/2022   History of present illness 57 y.o. male presents to [facility] hospital 10/23/22 with SOB, cough, admitted for management of likely CAP. PMH includes tachy-brady syndrome s/p pacemaker, HFpEF, afib, DMII, CKDIII.   OT comments  Pt still with increased bilateral hip pain and left knee pain with mobility.  Min guard for transfers from the recliner with min assist for sit to stand from tub bench.  Functional mobility at min guard with use of the RW for support.  Oxygen sats at 98% on room air throughout with HR in the mid to upper 60s.  Feel he is making great progress with OT and mostly seems limited by pain more than anything.  Will recommend continued acute care OT at this time to continue progression of ADL independence.  Patient will benefit from continued inpatient follow up therapy, <3 hours/day    Recommendations for follow up therapy are one component of a multi-disciplinary discharge planning process, led by the attending physician.  Recommendations may be updated based on patient status, additional functional criteria and insurance authorization.    Assistance Recommended at Discharge Frequent or constant Supervision/Assistance  Patient can return home with the following  A little help with walking and/or transfers;A little help with bathing/dressing/bathroom;Assistance with cooking/housework;Assist for transportation   Equipment Recommendations  BSC/3in1       Precautions / Restrictions Precautions Precautions: Fall Precaution Comments: bilateral hip pain/arthritis Restrictions Weight Bearing Restrictions: No       Mobility Bed Mobility                    Transfers Overall transfer level: Needs assistance Equipment used: Rolling walker (2 wheels) Transfers: Sit to/from Stand Sit to Stand: Min guard           General transfer comment: Min  assist for sit to stand from tub bench with min guard for sit to stand from the recliner using bilateral arm rests.     Balance Overall balance assessment: Needs assistance Sitting-balance support: No upper extremity supported, Feet supported Sitting balance-Leahy Scale: Good     Standing balance support: Bilateral upper extremity supported, During functional activity, Reliant on assistive device for balance Standing balance-Leahy Scale: Poor Standing balance comment: Pt needs BUE support for mobility.                           ADL either performed or assessed with clinical judgement   ADL Overall ADL's : Needs assistance/impaired                     Lower Body Dressing: Min guard;Sit to/from stand Lower Body Dressing Details (indicate cue type and reason): to doff and donn left hinged knee brace         Tub/ Shower Transfer: Minimal assistance;Tub bench;Rolling walker (2 wheels)   Functional mobility during ADLs: Minimal assistance (min guard for functional mobility with use of the RW) General ADL Comments: Pt able to remove and donn new left hinged knee brace with slight use of the reacher to help remove off of his ankle and foot.  He then completed functional mobility down to the therapy gym with use of the RW and increased time.  Noted excessive weight through BUEs with mobility to help reduce pain.  Practiced use of the tub bench so pt will have a betterm idea of what can  benefit him post rehab stay.  Oxygen sats at 98% on room air with HR in the upper 60s post mobility.      Cognition Arousal/Alertness: Awake/alert Behavior During Therapy: WFL for tasks assessed/performed Overall Cognitive Status: Within Functional Limits for tasks assessed                                                     Pertinent Vitals/ Pain       Pain Assessment Pain Assessment: 0-10 Pain Score: 8  Pain Location: bil hips, L knee Pain Descriptors /  Indicators: Aching, Grimacing, Discomfort Pain Intervention(s): Limited activity within patient's tolerance, Repositioned         Frequency  Min 2X/week        Progress Toward Goals  OT Goals(current goals can now be found in the care plan section)  Progress towards OT goals: Progressing toward goals  Acute Rehab OT Goals OT Goal Formulation: With patient Time For Goal Achievement: 11/08/22  Plan Discharge plan remains appropriate       AM-PAC OT "6 Clicks" Daily Activity     Outcome Measure   Help from another person eating meals?: None Help from another person taking care of personal grooming?: A Little Help from another person toileting, which includes using toliet, bedpan, or urinal?: A Little Help from another person bathing (including washing, rinsing, drying)?: A Little Help from another person to put on and taking off regular upper body clothing?: A Little Help from another person to put on and taking off regular lower body clothing?: A Little 6 Click Score: 19    End of Session Equipment Utilized During Treatment: Gait belt;Rolling walker (2 wheels)  OT Visit Diagnosis: Unsteadiness on feet (R26.81);Muscle weakness (generalized) (M62.81);Pain Pain - Right/Left: Left (bilateral) Pain - part of body: Hip   Activity Tolerance Patient tolerated treatment well   Patient Left in chair;with call bell/phone within reach;with chair alarm set   Nurse Communication Mobility status        Time: 1610-9604 OT Time Calculation (min): 42 min  Charges: OT General Charges $OT Visit: 1 Visit OT Treatments $Self Care/Home Management : 38-52 mins  Perrin Maltese, OTR/L Acute Rehabilitation Services  Office [phone] 11/02/2022

## 2022-11-02 NOTE — Assessment & Plan Note (Signed)
Patient using cocaine regularly, when he can afford it. Report's last use 1 wk ago.  -TOC for substance use 

## 2022-11-02 NOTE — Assessment & Plan Note (Signed)
Intertrigo of the groin. Candidal in appearance. Likely exacerbated by increased UOP in the setting of hyperglycemia. - Nystatin powder TID 

## 2022-11-02 NOTE — Assessment & Plan Note (Signed)
Euvolemic. Ongoing cocaine use, last use about 1wk prior to admission. -Home lasix 40mg  qd -Daily weights, strict I/O -Counsel on cocaine cessation

## 2022-11-02 NOTE — Progress Notes (Signed)
Daily Progress Note Intern Pager: [phone]  Patient name: Allen Gould Medical record number: [id number] Date of birth: 1966-03-10 Age: 57 y.o. Gender: male  Primary Care Provider: Sharmon Revere, MD Consultants: psych (signed off) Code Status: DNR   Pt Overview and Major Events to Date:  6/1 admitted  6/2 IV Insulin (endotool) started for hyperglycemia  6/3 Transitioned of endotool 6/8 Completed steroids/abx   Assessment and Plan:   Allen Gould is a 57 yo male presenting wih SOB, cough and sputum production, thought to 2/2 to asthma exacerbation +/- CAP Also with significant hyperglycemia.    He has a PMH significant for Tachy-Brady syndrome s/p pacemaker, HFpEF, A Fib on Eliquis, T2DM, CKD 3a  Active Hospital Problems   *Shortness of breath           Likely asthma exacerbation +/- CAP. CXR no focal           consolidation. Afebrile, lungs CTAB, doing well on           room air.PFTs in Jan 2023 with FEV1/FVC 99% of           predicted suggesting against COPD.            - completed antibiotic and steroid course           - Duonebs q6h PRN           - Started pulmicort while inpatient           - Start maintenance ICS (Arnuity 100 1 puff qd)           upon discharge, appreciate            pharmacy assistance           - Smoking cessation encouraged           - supplement O2 as needed           - PT/OT    Type II diabetes mellitus (HCC)           Blood glucose stable off of steroids, CBGs in           100s-200s           -Semglee 25u daily           -Novolog 10u TID WC           -mSSI           -QID CBG           -AM BMP    Scrotal swelling           Fairly impressive scrotal edema on admission. Hx           of tenderness to palpation, and reported history           of discharge from the scrotum. Of note, does have           a history of scrotal cyst excision in 2022.           Scrotal US shows large left hydrocele and           bilateral epididymal  head cyst, no signs of            abscess.            -CTM           -Will likely need outpatient urology    Severe depression (HCC)        Chronic  Mood improving during his stay.-Psychiatry signed           off-continue Cymbalta 120 mg daily           -Would benefit from therapy outpatient    Intertrigo           Intertrigo of the groin. Candidal in appearance.           Likely exacerbated by increased UOP in the setting           of hyperglycemia.           - Nystatin powder TID    (HFpEF) heart failure with preserved ejection fraction (HCC)        Chronic           Euvolemic. Ongoing cocaine use, last use about 1wk           prior to admission.           -Home lasix 40mg  qd           -Daily weights, strict I/O           -Counsel on cocaine cessation    Tachycardia-bradycardia syndrome (HCC)        Chronic           Status post pacemaker in 2022. HR stable           overnight.           -Continue home metoprolol succinate 25 mg daily    PAF (paroxysmal atrial fibrillation) (HCC)           No evidence of A-fib at present. Rates in 70s.           -Continue home Eliquis and metoprolol    Stage 3a chronic kidney disease (CKD) (HCC)           Stable           -Continue ARB           -Caution with nephrotoxic meds    Housing instability after recent homelessness           Recently homeless.  Now staying with his sister.           - TOC for housing resources           - Pt appealing SNF denial    COPD exacerbation (HCC)    Severe episode of recurrent major depressive disorder, without psychotic features (HCC)    CAP (community acquired pneumonia)    Substance abuse (HCC)           Patient using cocaine regularly, when he can           afford it. Report's last use            1 wk ago.            -TOC for substance use   Resolved Hospital Problems   Candida rash of groin        Date Resolved: 10/24/2022    Hyperglycemia        Date Resolved:  10/24/2022     FEN/GI: carb modified PPx: eliquis Dispo:SNF pending placement/appeal   Subjective:  NAEON, feeling well, no concerns today  Objective: Temp:  [98.1 F (36.7 C)-98.2 F (36.8 C)] 98.2 F (36.8 C) (06/11 0729) Pulse Rate:  [59-78] 60 (06/11 0756) Resp:  [17-18] 18 (06/11 0756) BP: (98-114)/(39-72) 106/71 (06/11 0729) SpO2:  [94 %-100 %] 97 % (06/11 0756) Physical Exam: General: NAD, laying in bed  Cardiovascular: RRR Respiratory: CTAB on RA Abdomen: Soft NT/ND Extremities: No significant edema  Laboratory: Most recent CBC Lab Results  Component Value Date   WBC 13.7 (H) 11/01/2022   HGB 11.1 (L) 11/01/2022   HCT 35.3 (L) 11/01/2022   MCV 102.0 (H) 11/01/2022   PLT 201 11/01/2022   Most recent BMP    Latest Ref Rng & Units 11/02/2022    4:22 AM  BMP  Glucose 70 - 99 mg/dL 161   BUN 6 - 20 mg/dL 53   Creatinine 0.96 - 1.24 mg/dL 0.45   Sodium 409 - 811 mmol/L 133   Potassium 3.5 - 5.1 mmol/L 4.5   Chloride 98 - 111 mmol/L 102   CO2 22 - 32 mmol/L 24   Calcium 8.9 - 10.3 mg/dL 8.5      Vonna Drafts, MD 11/02/2022, 11:10 AM  PGY-1, [facility] FPTS Intern pager: [phone], text pages welcome Secure chat group [facility] Teaching Service

## 2022-11-02 NOTE — Assessment & Plan Note (Signed)
Fairly impressive scrotal edema on admission. Hx of tenderness to palpation, and reported history of discharge from the scrotum. Of note, does have a history of scrotal cyst excision in 2022. Scrotal US shows large left hydrocele and bilateral epididymal head cyst, no signs of abscess.  -CTM -Will likely need outpatient urology

## 2022-11-02 NOTE — TOC Progression Note (Signed)
Transition of Care [facility]) - Progression Note    Patient Details  Name: Allen Gould MRN: [id number] Date of Birth: 1965/07/12  [facility] CM/SW Contact  Baldemar Lenis, [place] Phone Number: 11/02/2022, 10:29 AM  Clinical Narrative:   [name] contacted [employer] to check on status of appeal, as 72 hours was completed yesterday afternoon. [name] spoke with [place] (reference number [id number]) and the case was continued yesterday afternoon, [name] will need to call back in three more days for a decision. Case number is [id number]. [name] updated [name] and MD on barrier to discharge. [name] to follow.    Expected Discharge Plan: Skilled Nursing Facility Barriers to Discharge: Insurance Authorization  Expected Discharge Plan and Services       Living arrangements for the past 2 months: Single Family Home                                       Social Determinants of Health (SDOH) Interventions SDOH Screenings   Food Insecurity: Food Insecurity Present (10/23/2022)  Housing: High Risk (10/24/2022)  Transportation Needs: No Transportation Needs (10/24/2022)  Utilities: At Risk (10/24/2022)  Depression (PHQ2-9): High Risk (10/06/2021)  Financial Resource Strain: High Risk (05/22/2021)  Stress: Stress Concern Present (05/15/2021)  Tobacco Use: Medium Risk (10/23/2022)    Readmission Risk Interventions     No data to display

## 2022-11-02 NOTE — Assessment & Plan Note (Signed)
Likely asthma exacerbation +/- CAP. CXR no focal consolidation. Afebrile, lungs CTAB, doing well on room air. PFTs in Jan 2023 with FEV1/FVC 99% of predicted suggesting against COPD.  - completed antibiotic and steroid course - Duonebs q6h PRN - Started pulmicort while inpatient - Start maintenance ICS (Arnuity 100 1 puff qd) upon discharge, appreciate pharmacy assistance - Smoking cessation encouraged - supplement O2 as needed - PT/OT

## 2022-11-02 NOTE — Assessment & Plan Note (Signed)
Mood improving during his stay. -Psychiatry signed off-continue Cymbalta 120 mg daily -Would benefit from therapy outpatient

## 2022-11-02 NOTE — Assessment & Plan Note (Addendum)
Blood glucose stable off of steroids, CBGs in 100s-200s -Semglee 25u daily -Novolog 10u TID WC -mSSI -QID CBG -AM BMP

## 2022-11-02 NOTE — Plan of Care (Signed)
Problem: Education: Goal: Knowledge of disease or condition will improve Outcome: Progressing Goal: Knowledge of the prescribed therapeutic regimen will improve Outcome: Progressing Goal: Individualized Educational Video(s) Outcome: Progressing   Problem: Activity: Goal: Ability to tolerate increased activity will improve Outcome: Progressing Goal: Will verbalize the importance of balancing activity with adequate rest periods Outcome: Progressing   Problem: Respiratory: Goal: Ability to maintain a clear airway will improve Outcome: Progressing Goal: Levels of oxygenation will improve Outcome: Progressing Goal: Ability to maintain adequate ventilation will improve Outcome: Progressing   Problem: Education: Goal: Knowledge of General Education information will improve Description: Including pain rating scale, medication(s)/side effects and non-pharmacologic comfort measures Outcome: Progressing   Problem: Health Behavior/Discharge Planning: Goal: Ability to manage health-related needs will improve Outcome: Progressing   Problem: Clinical Measurements: Goal: Ability to maintain clinical measurements within normal limits will improve Outcome: Progressing Goal: Will remain free from infection Outcome: Progressing Goal: Diagnostic test results will improve Outcome: Progressing Goal: Respiratory complications will improve Outcome: Progressing Goal: Cardiovascular complication will be avoided Outcome: Progressing   Problem: Activity: Goal: Risk for activity intolerance will decrease Outcome: Progressing   Problem: Nutrition: Goal: Adequate nutrition will be maintained Outcome: Progressing   Problem: Coping: Goal: Level of anxiety will decrease Outcome: Progressing   Problem: Elimination: Goal: Will not experience complications related to bowel motility Outcome: Progressing Goal: Will not experience complications related to urinary retention Outcome: Progressing    Problem: Pain Managment: Goal: General experience of comfort will improve Outcome: Progressing   Problem: Safety: Goal: Ability to remain free from injury will improve Outcome: Progressing Note: Patient was instructed to call for assistance when getting OOB to the bathroom   Problem: Skin Integrity: Goal: Risk for impaired skin integrity will decrease Outcome: Progressing Note: Patient's skin is CD&I at all times to prevent skin breakdown.   Problem: Education: Goal: Ability to describe self-care measures that may prevent or decrease complications (Diabetes Survival Skills Education) will improve Outcome: Progressing Goal: Individualized Educational Video(s) Outcome: Progressing   Problem: Coping: Goal: Ability to adjust to condition or change in health will improve Outcome: Progressing   Problem: Fluid Volume: Goal: Ability to maintain a balanced intake and output will improve Outcome: Progressing   Problem: Health Behavior/Discharge Planning: Goal: Ability to identify and utilize available resources and services will improve Outcome: Progressing Goal: Ability to manage health-related needs will improve Outcome: Progressing   Problem: Metabolic: Goal: Ability to maintain appropriate glucose levels will improve Outcome: Progressing   Problem: Nutritional: Goal: Maintenance of adequate nutrition will improve Outcome: Progressing Goal: Progress toward achieving an optimal weight will improve Outcome: Progressing   Problem: Skin Integrity: Goal: Risk for impaired skin integrity will decrease Outcome: Progressing   Problem: Tissue Perfusion: Goal: Adequacy of tissue perfusion will improve Outcome: Progressing   Problem: Education: Goal: Ability to describe self-care measures that may prevent or decrease complications (Diabetes Survival Skills Education) will improve Outcome: Progressing Goal: Individualized Educational Video(s) Outcome: Progressing   Problem:  Cardiac: Goal: Ability to maintain an adequate cardiac output will improve Outcome: Progressing   Problem: Health Behavior/Discharge Planning: Goal: Ability to identify and utilize available resources and services will improve Outcome: Progressing Goal: Ability to manage health-related needs will improve Outcome: Progressing   Problem: Fluid Volume: Goal: Ability to achieve a balanced intake and output will improve Outcome: Progressing   Problem: Metabolic: Goal: Ability to maintain appropriate glucose levels will improve Outcome: Progressing   Problem: Nutritional: Goal: Maintenance of adequate nutrition will improve Outcome:  Progressing Goal: Maintenance of adequate weight for body size and type will improve Outcome: Progressing   Problem: Respiratory: Goal: Will regain and/or maintain adequate ventilation Outcome: Progressing   Problem: Urinary Elimination: Goal: Ability to achieve and maintain adequate renal perfusion and functioning will improve Outcome: Progressing

## 2022-11-02 NOTE — Assessment & Plan Note (Signed)
No evidence of A-fib at present. Rates in 70s. -Continue home Eliquis and metoprolol

## 2022-11-03 DIAGNOSIS — R0602 Shortness of breath: Secondary | ICD-10-CM | POA: Diagnosis not present

## 2022-11-03 LAB — GLUCOSE, CAPILLARY
Glucose-Capillary: 277 mg/dL — ABNORMAL HIGH (ref 70–99)
Glucose-Capillary: 174 mg/dL — ABNORMAL HIGH (ref 70–99)

## 2022-11-03 MED ORDER — DULOXETINE HCL 60 MG PO CPEP: 120.0000 mg | ORAL_CAPSULE | Freq: Every day | ORAL | 3 refills | Status: AC

## 2022-11-03 MED ORDER — INSULIN GLARGINE-YFGN 100 UNIT/ML ~~LOC~~ SOLN: 35.0000 [IU] | Freq: Every day | SUBCUTANEOUS | Status: DC

## 2022-11-03 MED ORDER — NYSTATIN 100000 UNIT/GM EX POWD: Freq: Three times a day (TID) | CUTANEOUS | 0 refills | Status: AC

## 2022-11-03 MED ORDER — DICLOFENAC SODIUM 1 % EX GEL: 2.0000 g | Freq: Two times a day (BID) | CUTANEOUS | Status: DC | PRN

## 2022-11-03 MED ORDER — INSULIN ASPART 100 UNIT/ML IJ SOLN: 10.0000 [IU] | Freq: Three times a day (TID) | INTRAMUSCULAR | 11 refills | Status: DC

## 2022-11-03 MED ORDER — ARNUITY ELLIPTA 100 MCG/ACT IN AEPB: 1.0000 | INHALATION_SPRAY | Freq: Every day | RESPIRATORY_TRACT | Status: DC

## 2022-11-03 MED ORDER — INSULIN GLARGINE-YFGN 100 UNIT/ML ~~LOC~~ SOLN: 30.0000 [IU] | Freq: Every day | SUBCUTANEOUS | 11 refills | Status: DC

## 2022-11-03 MED ORDER — ACETAMINOPHEN 325 MG PO TABS: 650.0000 mg | ORAL_TABLET | Freq: Four times a day (QID) | ORAL | Status: DC | PRN

## 2022-11-03 MED ORDER — FUROSEMIDE 40 MG PO TABS: 40.0000 mg | ORAL_TABLET | Freq: Every day | ORAL | Status: DC

## 2022-11-03 NOTE — Assessment & Plan Note (Signed)
Mood improving during his stay. -Psychiatry signed off-continue Cymbalta 120 mg daily -Would benefit from therapy outpatient

## 2022-11-03 NOTE — Progress Notes (Deleted)
Daily Progress Note Intern Pager: [phone]  Patient name: Allen Gould Medical record number: [id number] Date of birth: [birth year] Age: 57 y.o. Gender: male  Primary Care Provider: Sharmon Revere, MD Consultants: psych (signed off) Code Status: DNR   Pt Overview and Major Events to Date:  6/1 admitted  6/2 IV Insulin (endotool) started for hyperglycemia  6/3 Transitioned of endotool 6/8 Completed steroids/abx   Assessment and Plan:   Allen Gould is a 57 yo male presenting wih SOB, cough and sputum production, thought to 2/2 to asthma exacerbation +/- CAP Also with significant hyperglycemia.    He has a PMH significant for Tachy-Brady syndrome s/p pacemaker, HFpEF, A Fib on Eliquis, T2DM, CKD 3a  Active Hospital Problems   *Shortness of breath           Likely asthma exacerbation +/- CAP. CXR no focal           consolidation. Afebrile, lungs CTAB, doing well on           room air.PFTs in Jan 2023 with FEV1/FVC 99% of           predicted suggesting against COPD.            - completed antibiotic and steroid course           - Duonebs q6h PRN           - Started pulmicort while inpatient           - Start maintenance ICS (Arnuity 100 1 puff qd)           upon discharge, appreciate            pharmacy assistance           - Smoking cessation encouraged           - supplement O2 as needed           - PT/OT           - PRN tylenol, voltaren gel, kpad     Type II diabetes mellitus (HCC)           CBGs elevated yesterday after decreasing insulin.            Stable overnight.             Continuing to titrate           -Semglee 35u daily           -Novolog 10u TID WC           -mSSI + HS           -QID CBG               Scrotal swelling           Fairly impressive scrotal edema on admission. Hx           of tenderness to palpation, and reported history           of discharge from the scrotum. Of note, does have           a history of scrotal cyst excision in  2022.           Scrotal US shows large left hydrocele and           bilateral epididymal head cyst, no signs of            abscess.            -CTM           -  Will likely need outpatient urology    Severe depression (HCC)        Chronic           Mood improving during his stay.-Psychiatry signed           off-continue Cymbalta 120 mg daily           -Would benefit from therapy outpatient    Intertrigo           Intertrigo of the groin. Candidal in appearance.           Likely exacerbated by increased UOP in the setting           of hyperglycemia.           - Nystatin powder TID    (HFpEF) heart failure with preserved ejection fraction (HCC)        Chronic           Euvolemic. Ongoing cocaine use, last use about 1wk           prior to admission.           -Home lasix 40mg  qd           -Daily weights, strict I/O           -Counsel on cocaine cessation    Tachycardia-bradycardia syndrome (HCC)        Chronic           Status post pacemaker in 2022. HR stable           overnight.           -Continue home metoprolol succinate 25 mg daily    PAF (paroxysmal atrial fibrillation) (HCC)           No evidence of A-fib at present. Rates in 70s.           -Continue home Eliquis and metoprolol    Stage 3a chronic kidney disease (CKD) (HCC)           Stable           -Continue ARB           -Caution with nephrotoxic meds    Housing instability after recent homelessness           Recently homeless.  Now staying with his sister.           - TOC for housing resources           - Pt appealing SNF denial    COPD exacerbation (HCC)    Severe episode of recurrent major depressive disorder, without psychotic features (HCC)    CAP (community acquired pneumonia)    Substance abuse (HCC)           Patient using cocaine regularly, when he can           afford it. Report's last use            1 wk ago.            -TOC for substance use   Resolved Hospital Problems   Candida rash of  groin        Date Resolved: 10/24/2022    Hyperglycemia        Date Resolved: 10/24/2022     FEN/GI: carb modified PPx: eliquis Dispo:SNF pending placement/appeal   Subjective:  NAEON, reports some occasional hip pain/stiffness while ambulating due to his arthritis. Currently asymptomatic  Objective: Temp:  [97.7 F (36.5 C)-98.6 F (37 C)] 98.6 F (37  C) (06/12 0757) Pulse Rate:  [59-63] 59 (06/12 0757) Resp:  [16-20] 16 (06/12 0757) BP: (98-112)/(62-68) 98/67 (06/12 0757) SpO2:  [98 %-100 %] 98 % (06/12 0851) Physical Exam: General: NAD resting in bed Cardiovascular: RRR Respiratory: CTAB on RA Abdomen: soft NTND Extremities: no edema  Laboratory: Most recent CBC Lab Results  Component Value Date   WBC 13.7 (H) 11/01/2022   HGB 11.1 (L) 11/01/2022   HCT 35.3 (L) 11/01/2022   MCV 102.0 (H) 11/01/2022   PLT 201 11/01/2022   Most recent BMP    Latest Ref Rng & Units 11/02/2022    4:22 AM  BMP  Glucose 70 - 99 mg/dL 409   BUN 6 - 20 mg/dL 53   Creatinine 8.11 - 1.24 mg/dL 9.14   Sodium 782 - 956 mmol/L 133   Potassium 3.5 - 5.1 mmol/L 4.5   Chloride 98 - 111 mmol/L 102   CO2 22 - 32 mmol/L 24   Calcium 8.9 - 10.3 mg/dL 8.5     Vonna Drafts, MD 11/03/2022, 9:49 AM  PGY-1, [facility] FPTS Intern pager: [phone], text pages welcome Secure chat group [facility] Teaching Service

## 2022-11-03 NOTE — Assessment & Plan Note (Signed)
Euvolemic. Ongoing cocaine use, last use about 1wk prior to admission. -Home lasix 40mg  qd -Daily weights, strict I/O -Counsel on cocaine cessation

## 2022-11-03 NOTE — Assessment & Plan Note (Signed)
Stable -Continue ARB -Caution with nephrotoxic meds

## 2022-11-03 NOTE — Assessment & Plan Note (Signed)
Intertrigo of the groin. Candidal in appearance. Likely exacerbated by increased UOP in the setting of hyperglycemia. - Nystatin powder TID 

## 2022-11-03 NOTE — Assessment & Plan Note (Signed)
Patient using cocaine regularly, when he can afford it. Report's last use 1 wk ago.  -TOC for substance use 

## 2022-11-03 NOTE — Assessment & Plan Note (Signed)
Fairly impressive scrotal edema on admission. Hx of tenderness to palpation, and reported history of discharge from the scrotum. Of note, does have a history of scrotal cyst excision in 2022. Scrotal US shows large left hydrocele and bilateral epididymal head cyst, no signs of abscess.  -CTM -Will likely need outpatient urology

## 2022-11-03 NOTE — Assessment & Plan Note (Signed)
Status post pacemaker in 2022. HR stable overnight. -Continue home metoprolol succinate 25 mg daily 

## 2022-11-03 NOTE — Assessment & Plan Note (Signed)
Recently homeless.  Now staying with his sister. - TOC for housing resources - Pt appealing SNF denial

## 2022-11-03 NOTE — Discharge Summary (Signed)
Family Medicine Teaching [facility] Discharge Summary  Patient name: Allen Gould Medical record number: [id number] Date of birth: [birth year] Age: 57 y.o. Gender: male Date of Admission: 10/23/2022  Date of Discharge: 11/03/22 Admitting Physician: Vonna Drafts, MD  Primary Care Provider: Sharmon Revere, MD Consultants: psych  Indication for Hospitalization: asthma exacerbation, hyperglycemia  Discharge Diagnoses/Problem List:  Principal Problem for Admission: asthma, hyperglycemia Other Problems addressed during stay:  Principal Problem:   Shortness of breath Active Problems:   Type II diabetes mellitus (HCC)   Scrotal swelling   Severe depression (HCC)   (HFpEF) heart failure with preserved ejection fraction (HCC)   Intertrigo   Tachycardia-bradycardia syndrome (HCC)   PAF (paroxysmal atrial fibrillation) (HCC)   Stage 3a chronic kidney disease (CKD) (HCC)   Housing instability after recent homelessness   Substance abuse (HCC)   CAP (community acquired pneumonia)   Severe episode of recurrent major depressive disorder, without psychotic features (HCC)   COPD exacerbation (HCC)    Brief Hospital Course:  Allen Gould is a 57 y.o.male with a history of Tachy-Brady syndrome s/p pacemaker, HFpEF, A Fib on Eliquis, T2DM, CKD 3a  who was admitted to the [facility] Medicine Teaching Service at [facility] for dyspnea and hyperglycemia. His hospital course is detailed below:  Shortness of breath Presented with shortness of breath, productive cough, subjective fevers also wheezing.  Considered to be multifactorial in the setting of asthma versus possible community-acquired pneumonia.  Patient was started on course of antibiotics (ceftriaxone and azithromycin) and prednisone.  Patient also received scheduled DuoNebs.  Patient's respiratory status improved overall and he responded well to duonebs. Upon discharge, patient was started on maintenance ICS fluticasone inhaler Arnuity  100 1 puff daily.  Hyperglycemia / T2DM Patient noted to be hyperglycemic during his stay, in the setting of uncontrolled T2DM as well as concurrent steroid therapy. A1c >15. Patient briefly required Endotool insulin IV infusion. Was transitioned off of this after <1 day. His insulin regimen was adjusted, and upon discharge was sent to SNF with 30u long-acting plus 10u TID with meals.  Scrotal swelling / intertrigo Scrotal edema noted on exam upon admission. U/S showed large L hydrocele and bilateral epididymal head cyst, no abscess. Pt also received nystatin powder for intertrigo of groin. Likely 2/2 hyperglycemia/glucosuria. Recommend outpatient urology f/u.  Severe depression Patient noted to have worsening depression. His duloxetine dose was increased to 120mg  daily. Patient did endorse some passive SI. Psychiatry was consulted and recommended continued duloxetine and outpatient therapy f/u.  Other chronic conditions were medically managed with home medications and formulary alternatives as necessary (HFpEF, tachycardia-bradycardia syndrome, PAF, CKD 3a)  PCP Follow-up Recommendations: Recommend referral for therapy - evaluated by psychiatry during this admission for worsening depression Recommend urology referral for hydrocele Started on maintenance ICS for asthma, as well as insulin for diabetes. Please ensure compliance  Consider repeat PFTs Recommend smoking cessation Anemia noted during his admission. Recommend outpatient f/u, consider colonoscopy and/or iron studies  Disposition: SNF  Discharge Condition: stable, improved   Discharge Exam:  Vitals:   11/03/22 0757 11/03/22 0851  BP: 98/67   Pulse: (!) 59   Resp: 16   Temp: 98.6 F (37 C)   SpO2: 98% 98%   General: NAD resting in bed Cardiovascular: RRR Respiratory: CTAB on RA Abdomen: soft NTND Extremities: no edema  Significant Procedures: n/a  Significant Labs and Imaging:  No results for input(s): "WBC",  "HGB", "HCT", "PLT" in the last 48 hours. Recent Labs  Lab 11/02/22 0422  NA 133*  K 4.5  CL 102  CO2 24  GLUCOSE 243*  BUN 53*  CREATININE 1.39*  CALCIUM 8.5*    DG Chest IMPRESSION: No active disease.  US Scrotum IMPRESSION: 1. No evidence of testicular torsion. 2. Large left hydrocele. 3. Bilateral epididymal head cysts.  DG Knee L IMPRESSION: 1. No acute fracture or dislocation. 2. Minimal patellofemoral spurring and small patellar tendon enthesophyte.   Results/Tests Pending at Time of Discharge: na  Discharge Medications:  Allergies as of 11/03/2022       Reactions   Shellfish Allergy Hives        Medication List     STOP taking these medications    ibuprofen 200 MG tablet Commonly known as: ADVIL   metFORMIN 500 MG tablet Commonly known as: GLUCOPHAGE   traMADol 50 MG tablet Commonly known as: ULTRAM       TAKE these medications    Accu-Chek Guide test strip Generic drug: glucose blood Check blood sugar 3 times a day   Accu-Chek Softclix Lancets lancets Check blood sugar 3 times a day   amLODipine 10 MG tablet Commonly known as: NORVASC Take 1 tablet (10 mg total) by mouth daily.   apixaban 5 MG Tabs tablet Commonly known as: ELIQUIS Take 1 tablet (5 mg total) by mouth 2 (two) times daily.   Arnuity Ellipta 100 MCG/ACT Aepb Generic drug: Fluticasone Furoate Inhale 1 puff into the lungs daily.   atorvastatin 80 MG tablet Commonly known as: LIPITOR TAKE 1 TABLET (80 MG TOTAL) BY MOUTH DAILY.   diclofenac Sodium 1 % Gel Commonly known as: VOLTAREN Apply 4 g topically 4 (four) times daily -  before meals and at bedtime.   DULoxetine 60 MG capsule Commonly known as: CYMBALTA Take 2 capsules (120 mg total) by mouth daily. Start taking on: November 04, 2022 What changed:  medication strength how much to take   furosemide 40 MG tablet Commonly known as: LASIX Take 1 tablet (40 mg total) by mouth daily. Start taking on: November 04, 2022 What changed: when to take this   gabapentin 300 MG capsule Commonly known as: NEURONTIN TAKE 1 CAPSULE (300 MG TOTAL) BY MOUTH IN THE MORNING, AT NOON, AND AT BEDTIME.   insulin aspart 100 UNIT/ML injection Commonly known as: novoLOG Inject 10 Units into the skin 3 (three) times daily with meals.   insulin glargine-yfgn 100 UNIT/ML injection Commonly known as: SEMGLEE Inject 0.3 mLs (30 Units total) into the skin daily. Start taking on: November 04, 2022   metoprolol succinate 25 MG 24 hr tablet Commonly known as: Toprol XL Take 1 tablet (25 mg total) by mouth at bedtime.   nystatin powder Commonly known as: MYCOSTATIN/NYSTOP Apply 1 Application topically 3 (three) times daily.   nystatin powder Commonly known as: MYCOSTATIN/NYSTOP Apply topically 3 (three) times daily for 2 days.   pantoprazole 40 MG tablet Commonly known as: PROTONIX TAKE 1 TABLET (40 MG TOTAL) BY MOUTH DAILY.   Trulicity 1.5 MG/0.5ML Sopn Generic drug: Dulaglutide Inject 1.5 mg into the skin once a week.   valsartan 80 MG tablet Commonly known as: DIOVAN Take 1 tablet (80 mg total) by mouth daily.        Discharge Instructions: Please refer to Patient Instructions section of EMR for full details.  Patient was counseled important signs and symptoms that should prompt return to medical care, changes in medications, dietary instructions, activity restrictions, and follow up appointments.  Follow-Up Appointments:  Follow-up Information     Sharmon Revere, MD.   Specialty: Family Medicine Why: Need to follow-up in 1 week for recheck. Contact information: [address] [place] [address] [phone]                 Vonna Drafts, MD 11/03/2022, 10:30 AM PGY-1, [facility]

## 2022-11-03 NOTE — Assessment & Plan Note (Signed)
No evidence of A-fib at present. Rates in 70s. -Continue home Eliquis and metoprolol

## 2022-11-03 NOTE — Assessment & Plan Note (Addendum)
CBGs elevated yesterday after decreasing insulin.  Stable overnight.  Continuing to titrate -Semglee 35u daily -Novolog 10u TID WC -mSSI + HS -QID CBG

## 2022-11-03 NOTE — Assessment & Plan Note (Addendum)
Likely asthma exacerbation +/- CAP. CXR no focal consolidation. Afebrile, lungs CTAB, doing well on room air. PFTs in Jan 2023 with FEV1/FVC 99% of predicted suggesting against COPD.  - completed antibiotic and steroid course - Duonebs q6h PRN - Started pulmicort while inpatient - Start maintenance ICS (Arnuity 100 1 puff qd) upon discharge, appreciate pharmacy assistance - Smoking cessation encouraged - supplement O2 as needed - PT/OT - PRN tylenol, voltaren gel, kpad

## 2023-01-03 ENCOUNTER — Telehealth: Payer: Self-pay | Admitting: *Deleted

## 2023-01-03 NOTE — Patient Outreach (Signed)
  Care Management   Note  01/03/2023 Name: Allen Gould MRN: [id number] DOB: 06/19/1965  [name] is enrolled in a Managed Medicaid plan: No. Outreach attempt today was unsuccessful.   RNCM attempting to reach [name] to discuss Case Management/Care Coordination. Per recent admission note, patient discharged to SNF. RNCM will attempt to reach [name] to verify that he is currently in SNF.   The care management team will reach out to the patient again over the next 7 days.   Estanislado Emms RN, BSN [place]  Managed [facility] RN Care Coordinator [phone]

## 2023-01-05 ENCOUNTER — Other Ambulatory Visit: Payer: Medicare HMO | Admitting: *Deleted

## 2023-01-05 NOTE — Patient Outreach (Signed)
Care Management/Care Coordination  RN Case Manager Case Closure Note  01/05/2023 Name: Allen Gould MRN: [id number] DOB: 08-Aug-1965  Allen Gould is a 57 y.o. year old male who is a primary care patient of Paliwal, Himanshu, MD. The care management/care coordination team was consulted for assistance with chronic disease management and/or care coordination needs.   Care Plan : RN Care Manager Plan of Care  Updates made by Heidi Dach, RN since 01/05/2023 12:00 AM  Completed 01/05/2023   Problem: Health Management needs related to HF Resolved 01/05/2023     Long-Range Goal: Development of Plan of Care to address health management needs related to HF Completed 01/05/2023  Start Date: 05/11/2021  Expected End Date: 08/09/2021  Priority: High  Note:   Current Barriers:  Chronic Disease Management support and education needs related to CHF and pain Patient discharged to SNF   RNCM Clinical Goal(s):  Patient will verbalize understanding of plan for management of CHF and Depression as evidenced by patient verbalization of self monitoring activities take all medications exactly as prescribed and will call provider for medication related questions as evidenced by documentation in EMR    attend all scheduled medical appointments: 1/25 with Fredia Sorrow and reschedule appointment on 1/5 with PCP as evidenced by provider documentation in EMR        work with social worker to address Housing barriers and Mental Health Concerns  related to the management of CHF, Depression, and Homelessness as evidenced by review of EMR and patient or Child psychotherapist report     through collaboration with Medical illustrator, provider, and care team.   Interventions: Inter-disciplinary care team collaboration (see longitudinal plan of care) Evaluation of current treatment plan related to  self management and patient's adherence to plan as established by provider   Pain Interventions:  (Status:  New goal.)  Long Term Goal Pain assessment performed Medications reviewed Reviewed provider established plan for pain management Counseled on the importance of reporting any/all new or changed pain symptoms or management strategies to pain management provider Reviewed with patient prescribed pharmacological and nonpharmacological pain relief strategies Advised patient to schedule a follow up with Orthopedics to discuss increased pain Discussed weight loss and the benefits, education provided  Heart Failure Interventions:  (Status: Goal on Track (progressing): YES.)  Long Term Goal  Provided education on low sodium diet Discussed the importance of keeping all appointments with provider Assessed social determinant of health barriers Provided therapeutic listening Provided patient with information on [facility] [phone] Reviewed scheduled appointments: 1/25 with Fredia Sorrow and needing to reschedule appointment on 1/5 with PCP-Patient aware of all upcoming appointments and has arranged transporation   Patient Goals/Self-Care Activities: Take medications as prescribed   Attend all scheduled provider appointments Call pharmacy for medication refills 3-7 days in advance of running out of medications Call provider office for new concerns or questions  Work with the social worker to address care coordination needs and will continue to work with the clinical team to address health care and disease management related needs call the Suicide and Crisis Lifeline: 988 call 1-800-273-TALK (toll free, 24 hour hotline) go to [facility] [address] [place] [phone]) if experiencing a Mental Health or Behavioral Health Crisis  call office if I gain more than 2 pounds in one day or 5 pounds in one week do ankle pumps when sitting watch for swelling in feet, ankles and legs every day develop a rescue  plan eat more whole grains, fruits and  vegetables, lean meats and healthy fats dress right for the weather, hot or cold       Plan:  Patient discharged to Skilled Nursing Facility  Estanislado Emms RN, BSN [place]  Managed [facility] RN Care Coordinator [phone]

## 2023-01-30 ENCOUNTER — Emergency Department (HOSPITAL_COMMUNITY)
Admission: EM | Admit: 2023-01-30 | Discharge: 2023-01-31 | Disposition: A | Discharge status: Home / Self Care | Payer: Medicare HMO | Attending: Emergency Medicine | Admitting: Emergency Medicine

## 2023-01-30 ENCOUNTER — Other Ambulatory Visit: Payer: Self-pay

## 2023-01-30 ENCOUNTER — Encounter (HOSPITAL_COMMUNITY): Payer: Self-pay

## 2023-01-30 DIAGNOSIS — R7989 Other specified abnormal findings of blood chemistry: Secondary | ICD-10-CM | POA: Insufficient documentation

## 2023-01-30 DIAGNOSIS — E162 Hypoglycemia, unspecified: Secondary | ICD-10-CM | POA: Diagnosis present

## 2023-01-30 DIAGNOSIS — Z1152 Encounter for screening for COVID-19: Secondary | ICD-10-CM | POA: Insufficient documentation

## 2023-01-30 DIAGNOSIS — Z794 Long term (current) use of insulin: Secondary | ICD-10-CM | POA: Insufficient documentation

## 2023-01-30 DIAGNOSIS — E11649 Type 2 diabetes mellitus with hypoglycemia without coma: Secondary | ICD-10-CM | POA: Insufficient documentation

## 2023-01-30 DIAGNOSIS — Z7901 Long term (current) use of anticoagulants: Secondary | ICD-10-CM | POA: Insufficient documentation

## 2023-01-30 LAB — CBG MONITORING, ED
Glucose-Capillary: 56 mg/dL — ABNORMAL LOW (ref 70–99)
Glucose-Capillary: 68 mg/dL — ABNORMAL LOW (ref 70–99)
Glucose-Capillary: 105 mg/dL — ABNORMAL HIGH (ref 70–99)
Glucose-Capillary: 98 mg/dL (ref 70–99)

## 2023-01-30 LAB — URINALYSIS, ROUTINE W REFLEX MICROSCOPIC
Bilirubin Urine: NEGATIVE
Glucose, UA: NEGATIVE mg/dL
Hgb urine dipstick: NEGATIVE
Ketones, ur: NEGATIVE mg/dL
Leukocytes,Ua: NEGATIVE
Nitrite: NEGATIVE
Protein, ur: NEGATIVE mg/dL
Specific Gravity, Urine: 1.012 (ref 1.005–1.030)
pH: 5 (ref 5.0–8.0)

## 2023-01-30 LAB — CBC WITH DIFFERENTIAL/PLATELET
Abs Immature Granulocytes: 0.03 10*3/uL (ref 0.00–0.07)
Basophils Absolute: 0 10*3/uL (ref 0.0–0.1)
Basophils Relative: 0 %
Eosinophils Absolute: 0.1 10*3/uL (ref 0.0–0.5)
Eosinophils Relative: 1 %
HCT: 39.7 % (ref 39.0–52.0)
Hemoglobin: 13.2 g/dL (ref 13.0–17.0)
Immature Granulocytes: 0 %
Lymphocytes Relative: 13 %
Lymphs Abs: 1.3 10*3/uL (ref 0.7–4.0)
MCH: 32.3 pg (ref 26.0–34.0)
MCHC: 33.2 g/dL (ref 30.0–36.0)
MCV: 97.1 fL (ref 80.0–100.0)
Monocytes Absolute: 0.7 10*3/uL (ref 0.1–1.0)
Monocytes Relative: 7 %
Neutro Abs: 7.5 10*3/uL (ref 1.7–7.7)
Neutrophils Relative %: 79 %
Platelets: 204 10*3/uL (ref 150–400)
RBC: 4.09 MIL/uL — ABNORMAL LOW (ref 4.22–5.81)
RDW: 13.4 % (ref 11.5–15.5)
WBC: 9.7 10*3/uL (ref 4.0–10.5)
nRBC: 0 % (ref 0.0–0.2)

## 2023-01-30 LAB — COMPREHENSIVE METABOLIC PANEL
ALT: 34 U/L (ref 0–44)
AST: 53 U/L — ABNORMAL HIGH (ref 15–41)
Albumin: 3.7 g/dL (ref 3.5–5.0)
Alkaline Phosphatase: 86 U/L (ref 38–126)
Anion gap: 13 (ref 5–15)
BUN: 35 mg/dL — ABNORMAL HIGH (ref 6–20)
CO2: 25 mmol/L (ref 22–32)
Calcium: 8.7 mg/dL — ABNORMAL LOW (ref 8.9–10.3)
Chloride: 100 mmol/L (ref 98–111)
Creatinine, Ser: 1.52 mg/dL — ABNORMAL HIGH (ref 0.61–1.24)
GFR, Estimated: 53 mL/min — ABNORMAL LOW (ref >60)
Glucose, Bld: 58 mg/dL — ABNORMAL LOW (ref 70–99)
Potassium: 3.7 mmol/L (ref 3.5–5.1)
Sodium: 138 mmol/L (ref 135–145)
Total Bilirubin: 0.5 mg/dL (ref 0.3–1.2)
Total Protein: 7.2 g/dL (ref 6.5–8.1)

## 2023-01-30 LAB — SARS CORONAVIRUS 2 BY RT PCR: SARS Coronavirus 2 by RT PCR: NEGATIVE

## 2023-01-30 LAB — ETHANOL: Alcohol, Ethyl (B): 117 mg/dL — ABNORMAL HIGH (ref <10)

## 2023-01-30 LAB — LIPASE, BLOOD: Lipase: 30 U/L (ref 11–51)

## 2023-01-30 MED ORDER — DEXTROSE 50 % IV SOLN
12.5000 g | Freq: Once | INTRAVENOUS | Status: AC
  Administered 2023-01-30: 12.5 g via INTRAVENOUS
  Filled 2023-01-30: qty 50

## 2023-01-30 NOTE — ED Triage Notes (Addendum)
Pt BIB [facility] from [facility] c/o diarrhea, fatigue, chills and low blood sugar. CBG was 75. Per EMS it smells like he has ETOH on board but pt denies any alcohol use since he is in a facility. Pt was given chocolate and EMS rechecked and got 81. Pt's CBG is currently 68 in triage.

## 2023-01-30 NOTE — ED Notes (Signed)
Ptar called 

## 2023-01-30 NOTE — ED Provider Notes (Cosign Needed Addendum)
[place] EMERGENCY DEPARTMENT AT [facility] Provider Note   CSN: [id number] Arrival date & time: 01/30/23  1823     History  Chief Complaint  Patient presents with   Diarrhea   Chills   Hypoglycemia    Allen Gould is a 57 y.o. male.  57 y.o male with a PMH of DM presents to the ED from [place] facility with a chief complaint of hypoglycemia.  Patient reports that he was told at his last admission on October 23, 2022 that he needed to take 3 units of insulin every time his blood sugar was above 100, in addition he was told that if it was above 150 he needed to take 4 units of his insulin.  He is currently taking 30 units of long-lasting insulin, last taken last night.  He reports he has been feeling down for the past 3 days, has been resting in bed.  He is also endorsing some multiple episodes of diarrhea, no blood in his stool.  Reports he has been eating although he feels somewhat nauseous.  He is not endorsing any abdominal pain at this time, no fever, no chills, no vomiting.  No other medication changes, no chest pain, no shortness of breath.  The history is provided by the patient and medical records.  Diarrhea Associated symptoms: chills   Associated symptoms: no abdominal pain, no fever and no vomiting   Hypoglycemia Associated symptoms: no shortness of breath and no vomiting        Home Medications Prior to Admission medications   Medication Sig Start Date End Date Taking? Authorizing Provider  Accu-Chek Softclix Lancets lancets Check blood sugar 3 times a day 06/11/20   Quincy Simmonds, MD  amLODipine (NORVASC) 10 MG tablet Take 1 tablet (10 mg total) by mouth daily. 10/06/21 10/23/22  Elige Radon, MD  apixaban (ELIQUIS) 5 MG TABS tablet Take 1 tablet (5 mg total) by mouth 2 (two) times daily. 01/30/21   Quintella Reichert, MD  atorvastatin (LIPITOR) 80 MG tablet TAKE 1 TABLET (80 MG TOTAL) BY MOUTH DAILY. 07/01/21   Elige Radon, MD  diclofenac Sodium  (VOLTAREN) 1 % GEL Apply 4 g topically 4 (four) times daily -  before meals and at bedtime. 01/30/22   Mapp, Gaylyn Cheers, MD  Dulaglutide (TRULICITY) 1.5 MG/0.5ML SOPN Inject 1.5 mg into the skin once a week.    [provider]  DULoxetine (CYMBALTA) 60 MG capsule Take 2 capsules (120 mg total) by mouth daily. 11/04/22   Shelby Mattocks, DO  Fluticasone Furoate (ARNUITY ELLIPTA) 100 MCG/ACT AEPB Inhale 1 puff into the lungs daily. 11/03/22   Shelby Mattocks, DO  furosemide (LASIX) 40 MG tablet Take 1 tablet (40 mg total) by mouth daily. 11/04/22   Shelby Mattocks, DO  gabapentin (NEURONTIN) 300 MG capsule TAKE 1 CAPSULE (300 MG TOTAL) BY MOUTH IN THE MORNING, AT NOON, AND AT BEDTIME. 09/03/21 10/23/22  [name], [name], MD  glucose blood (ACCU-CHEK GUIDE) test strip Check blood sugar 3 times a day 06/11/20   Quincy Simmonds, MD  insulin aspart (NOVOLOG) 100 UNIT/ML injection Inject 10 Units into the skin 3 (three) times daily with meals. 11/03/22   Shelby Mattocks, DO  insulin glargine-yfgn (SEMGLEE) 100 UNIT/ML injection Inject 0.3 mLs (30 Units total) into the skin daily. 11/04/22   Shelby Mattocks, DO  metoprolol succinate (TOPROL XL) 25 MG 24 hr tablet Take 1 tablet (25 mg total) by mouth at bedtime. 10/06/21   Elige Radon, MD  nystatin (MYCOSTATIN/NYSTOP) powder Apply 1 Application topically 3 (three) times daily. 10/23/22   Gwyneth Sprout, MD  pantoprazole (PROTONIX) 40 MG tablet TAKE 1 TABLET (40 MG TOTAL) BY MOUTH DAILY. 09/03/21 10/23/22  Elige Radon, MD  valsartan (DIOVAN) 80 MG tablet Take 1 tablet (80 mg total) by mouth daily. 10/06/21   Elige Radon, MD  buPROPion (WELLBUTRIN XL) 150 MG 24 hr tablet Take 1 tablet (150 mg total) by mouth every morning. 06/04/21 06/13/21  Elige Radon, MD      Allergies    Shellfish allergy    Review of Systems   Review of Systems  Constitutional:  Positive for chills. Negative for fever.  Respiratory:  Negative for shortness of breath.    Cardiovascular:  Negative for chest pain.  Gastrointestinal:  Positive for diarrhea and nausea. Negative for abdominal pain and vomiting.  Genitourinary:  Negative for flank pain.  Musculoskeletal:  Negative for back pain.  All other systems reviewed and are negative.   Physical Exam Updated Vital Signs BP 134/80   Pulse 74   Temp 97.9 F (36.6 C) (Oral)   Resp 20   Ht 5\' 10"  (1.778 m)   Wt (!) 149.7 kg   SpO2 99%   BMI 47.35 kg/m  Physical Exam Vitals and nursing note reviewed.  Constitutional:      Appearance: Normal appearance.  HENT:     Head: Normocephalic and atraumatic.     Mouth/Throat:     Mouth: Mucous membranes are moist.  Eyes:     Pupils: Pupils are equal, round, and reactive to light.  Cardiovascular:     Rate and Rhythm: Normal rate.  Pulmonary:     Effort: Pulmonary effort is normal.  Abdominal:     General: Abdomen is flat.  Musculoskeletal:     Cervical back: Normal range of motion and neck supple.  Skin:    General: Skin is warm and dry.  Neurological:     Mental Status: He is alert and oriented to person, place, and time.     ED Results / Procedures / Treatments   Labs (all labs ordered are listed, but only abnormal results are displayed) Labs Reviewed  CBC WITH DIFFERENTIAL/PLATELET - Abnormal; Notable for the following components:      Result Value   RBC 4.09 (*)    All other components within normal limits  COMPREHENSIVE METABOLIC PANEL - Abnormal; Notable for the following components:   Glucose, Bld 58 (*)    BUN 35 (*)    Creatinine, Ser 1.52 (*)    Calcium 8.7 (*)    AST 53 (*)    GFR, Estimated 53 (*)    All other components within normal limits  ETHANOL - Abnormal; Notable for the following components:   Alcohol, Ethyl (B) 117 (*)    All other components within normal limits  CBG MONITORING, ED - Abnormal; Notable for the following components:   Glucose-Capillary 56 (*)    All other components within normal limits  CBG  MONITORING, ED - Abnormal; Notable for the following components:   Glucose-Capillary 68 (*)    All other components within normal limits  CBG MONITORING, ED - Abnormal; Notable for the following components:   Glucose-Capillary 105 (*)    All other components within normal limits  SARS CORONAVIRUS 2 BY RT PCR  LIPASE, BLOOD  URINALYSIS, ROUTINE W REFLEX MICROSCOPIC  CBG MONITORING, ED    EKG None  Radiology No results found.  Procedures Procedures  Medications Ordered in ED Medications  dextrose 50 % solution 12.5 g (12.5 g Intravenous Given 01/30/23 1936)    ED Course/ Medical Decision Making/ A&P                                 Medical Decision Making Amount and/or Complexity of Data Reviewed Labs: ordered.  Risk Prescription drug management.   This patient presents to the ED for concern of hypoglycemia, this involves a number of treatment options, and is a complaint that carries with it a high risk of complications and morbidity.  The differential diagnosis includes medication non compliance,    Co morbidities: Discussed in HPI   Brief History:  See HPI.   EMR reviewed including pt PMHx, past surgical history and past visits to ER.   See HPI for more details   Lab Tests:  I ordered and independently interpreted labs.  The pertinent results include:    I personally reviewed all laboratory work and imaging. Metabolic panel without any acute abnormality specifically kidney function within normal limits and no significant electrolyte abnormalities. CBC without leukocytosis or significant anemia. Lipase level is normal.   Imaging Studies:  No imaging studies ordered for this patient  Medicines ordered:  I ordered medication including dextrose  for hypoglycemia Reevaluation of the patient after these medicines showed that the patient improved I have reviewed the patients home medicines and have made adjustments as needed  Reevaluation:  After the  interventions noted above I re-evaluated patient and found that they have :improved  Social Determinants of Health:  The patient's social determinants of health were a factor in the care of this patient  Problem List / ED Course:  Patient with underlying history of diabetes presents to the ED with a chief complaint of hypoglycemia, reports he received 30 units of insulin long-acting at the facility, he reports he was told that he needed to take 3 units of insulin after his blood sugar is over 100, he was also told that if it was over 150 he needs to take 4 extra units.  Patient reports he has not been eating.  He is endorsing multiple episodes of diarrhea.  He was given dextrose on arrival due to a blood sugar in the 60s.  His blood sugar was rechecked here consecutively and overall has remained stable. CBC with no leukocytosis, hemoglobin is within normal limits.  CMP with no electrolyte derangement.  Creatinine level is at his baseline.  LFTs noted for slight elevation of his AST.  COVID-19 is negative, lipase level is normal.  Blood sugar was checked 56, 106, 98. He does smell somewhat of alcohol on today's visit, his alcohol level was 117, he does appear somewhat sleepy but is going back to a facility of [facility].  UA without any signs of infection.  I do suspect that patient needs changes to his long-term insulin, I discussed with him the need for having this medication taper at his facility. I discussed with patient at length that he will need to have medication tapered at the SNF facility, also concern how he is obtaining EtOH at facility.  He is aware that he will need to eat if he is going to be taking his insulin.  Patient is hemodynamically stable for discharge.  Dispostion:  After consideration of the diagnostic results and the patients response to treatment, I feel that the patent would benefit from outpatient follow up with  his physician.     Portions of this note were generated  with Scientist, clinical (histocompatibility and immunogenetics). Dictation errors may occur despite best attempts at proofreading.   Final Clinical Impression(s) / ED Diagnoses Final diagnoses:  Hypoglycemia    Rx / DC Orders ED Discharge Orders     None         Freddy Jaksch 01/30/23 2259    Lonell Grandchild, MD 01/31/23 1245    Claude Manges, PA-C 02/16/23 1813    Lonell Grandchild, MD 02/17/23 1601

## 2023-01-30 NOTE — ED Provider Triage Note (Signed)
Emergency Medicine Provider Triage Evaluation Note  Allen Gould , a 57 y.o. male  was evaluated in triage.  Pt complains of diarrhea, fatigue, chills and low blood sugar in the last couple days.  Patient takes Trulicity once a week and only takes insulins when his blood sugar is above 100.  He denies abdominal pain but reports nonbloody diarrhea.  Endorses nausea without vomiting.  Denies fever, chest pain, shortness of breath.  Review of Systems  Positive: As above Negative: As above  Physical Exam  BP 125/72 (BP Location: Right Arm)   Pulse (!) 59   Temp 97.7 F (36.5 C)   Resp 18   Ht 5\' 10"  (1.778 m)   Wt (!) 149.7 kg   SpO2 97%   BMI 47.35 kg/m  Gen:   Awake, no distress   Resp:  Normal effort  MSK:   Moves extremities without difficulty  Other:    Medical Decision Making  Medically screening exam initiated at 7:05 PM.  Appropriate orders placed.  Allen Gould was informed that the remainder of the evaluation will be completed by another provider, this initial triage assessment does not replace that evaluation, and the importance of remaining in the ED until their evaluation is complete.    Jeanelle Malling, [place] 01/30/23 Windell Moment

## 2023-01-30 NOTE — ED Notes (Signed)
Pt bs 54. Advised PA orders placed and juice given.

## 2023-01-30 NOTE — Discharge Instructions (Addendum)
PLEASE HAVE YOUR SLIDING SCALE ADJUSTED AT [facility]!!!  You cannot take your insulin if you are not eating and drinking alcohol.

## 2023-01-31 NOTE — ED Notes (Signed)
Called [facility] for report x2, no answer.

## 2023-02-19 ENCOUNTER — Other Ambulatory Visit: Payer: Self-pay

## 2023-02-19 ENCOUNTER — Emergency Department (HOSPITAL_COMMUNITY)
Admission: EM | Admit: 2023-02-19 | Discharge: 2023-02-20 | Disposition: A | Discharge status: Skilled Nursing Facility | Payer: Medicare HMO | Attending: Emergency Medicine | Admitting: Emergency Medicine

## 2023-02-19 ENCOUNTER — Encounter (HOSPITAL_COMMUNITY): Payer: Self-pay

## 2023-02-19 ENCOUNTER — Emergency Department (HOSPITAL_COMMUNITY): Payer: Medicare HMO

## 2023-02-19 DIAGNOSIS — Z794 Long term (current) use of insulin: Secondary | ICD-10-CM | POA: Insufficient documentation

## 2023-02-19 DIAGNOSIS — J45909 Unspecified asthma, uncomplicated: Secondary | ICD-10-CM | POA: Insufficient documentation

## 2023-02-19 DIAGNOSIS — Z7984 Long term (current) use of oral hypoglycemic drugs: Secondary | ICD-10-CM | POA: Insufficient documentation

## 2023-02-19 DIAGNOSIS — E119 Type 2 diabetes mellitus without complications: Secondary | ICD-10-CM | POA: Diagnosis not present

## 2023-02-19 DIAGNOSIS — Z950 Presence of cardiac pacemaker: Secondary | ICD-10-CM | POA: Diagnosis not present

## 2023-02-19 DIAGNOSIS — Z7901 Long term (current) use of anticoagulants: Secondary | ICD-10-CM | POA: Insufficient documentation

## 2023-02-19 DIAGNOSIS — Z79899 Other long term (current) drug therapy: Secondary | ICD-10-CM | POA: Diagnosis not present

## 2023-02-19 DIAGNOSIS — I10 Essential (primary) hypertension: Secondary | ICD-10-CM | POA: Insufficient documentation

## 2023-02-19 DIAGNOSIS — Z7951 Long term (current) use of inhaled steroids: Secondary | ICD-10-CM | POA: Diagnosis not present

## 2023-02-19 DIAGNOSIS — R079 Chest pain, unspecified: Secondary | ICD-10-CM | POA: Insufficient documentation

## 2023-02-19 DIAGNOSIS — R0789 Other chest pain: Secondary | ICD-10-CM

## 2023-02-19 LAB — CBG MONITORING, ED: Glucose-Capillary: 86 mg/dL (ref 70–99)

## 2023-02-19 LAB — BASIC METABOLIC PANEL
Anion gap: 17 — ABNORMAL HIGH (ref 5–15)
BUN: 26 mg/dL — ABNORMAL HIGH (ref 6–20)
CO2: 21 mmol/L — ABNORMAL LOW (ref 22–32)
Calcium: 9.5 mg/dL (ref 8.9–10.3)
Chloride: 98 mmol/L (ref 98–111)
Creatinine, Ser: 1.31 mg/dL — ABNORMAL HIGH (ref 0.61–1.24)
GFR, Estimated: >60 mL/min (ref >60)
Glucose, Bld: 88 mg/dL (ref 70–99)
Potassium: 4.2 mmol/L (ref 3.5–5.1)
Sodium: 136 mmol/L (ref 135–145)

## 2023-02-19 LAB — TROPONIN I (HIGH SENSITIVITY)
Troponin I (High Sensitivity): 16 ng/L (ref <18)
Troponin I (High Sensitivity): 12 ng/L (ref <18)

## 2023-02-19 LAB — CBC
HCT: 40 % (ref 39.0–52.0)
Hemoglobin: 12.9 g/dL — ABNORMAL LOW (ref 13.0–17.0)
MCH: 30.9 pg (ref 26.0–34.0)
MCHC: 32.3 g/dL (ref 30.0–36.0)
MCV: 95.9 fL (ref 80.0–100.0)
Platelets: 254 10*3/uL (ref 150–400)
RBC: 4.17 MIL/uL — ABNORMAL LOW (ref 4.22–5.81)
RDW: 13 % (ref 11.5–15.5)
WBC: 10.3 10*3/uL (ref 4.0–10.5)
nRBC: 0 % (ref 0.0–0.2)

## 2023-02-19 MED ORDER — ACETAMINOPHEN 325 MG PO TABS
650.0000 mg | ORAL_TABLET | Freq: Once | ORAL | Status: AC
  Administered 2023-02-19: 650 mg via ORAL
  Filled 2023-02-19: qty 2

## 2023-02-19 NOTE — Discharge Instructions (Addendum)
You are seen in the emergency room today for chest pain.  Your imaging and lab work came back reassuring.  Recommend calling your primary care to set up follow-up for today's visit.   Return to the emergency room with any new or worsening symptoms.

## 2023-02-19 NOTE — ED Provider Notes (Signed)
[place] EMERGENCY DEPARTMENT AT [facility] Provider Note   CSN: [id number] Arrival date & time: 02/19/23  1741     History  Chief Complaint  Patient presents with   Chest Pain    Allen Gould is a 57 y.o. 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  Chest pain was located over the left sided upper chest and referred to the shoulder.  Patient states that the chest pain started after he got very angry with his living situation.  Chest pain was consistent throughout the day worse with movement.  He has never had chest pain like this in the past.  He EMS gave him 1 nitroglycerin and aspirin, which did not relieve his chest pain.  Denies any shortness of breath, dizziness, lightheadedness, nausea vomiting diarrhea, cough or fever.    Chest Pain      Home Medications Prior to Admission medications   Medication Sig Start Date End Date Taking? Authorizing Provider  Accu-Chek Softclix Lancets lancets Check blood sugar 3 times a day 06/11/20   Quincy Simmonds, MD  amLODipine (NORVASC) 10 MG tablet Take 1 tablet (10 mg total) by mouth daily. 10/06/21 10/23/22  Elige Radon, MD  apixaban (ELIQUIS) 5 MG TABS tablet Take 1 tablet (5 mg total) by mouth 2 (two) times daily. 01/30/21   Quintella Reichert, MD  atorvastatin (LIPITOR) 80 MG tablet TAKE 1 TABLET (80 MG TOTAL) BY MOUTH DAILY. 07/01/21   Elige Radon, MD  diclofenac Sodium (VOLTAREN) 1 % GEL Apply 4 g topically 4 (four) times daily -  before meals and at bedtime. 01/30/22   Mapp, Gaylyn Cheers, MD  Dulaglutide (TRULICITY) 1.5 MG/0.5ML SOPN Inject 1.5 mg into the skin once a week.    [provider]  DULoxetine (CYMBALTA) 60 MG capsule Take 2 capsules (120 mg total) by mouth daily. 11/04/22   Shelby Mattocks, DO   Fluticasone Furoate (ARNUITY ELLIPTA) 100 MCG/ACT AEPB Inhale 1 puff into the lungs daily. 11/03/22   Shelby Mattocks, DO  furosemide (LASIX) 40 MG tablet Take 1 tablet (40 mg total) by mouth daily. 11/04/22   Shelby Mattocks, DO  gabapentin (NEURONTIN) 300 MG capsule TAKE 1 CAPSULE (300 MG TOTAL) BY MOUTH IN THE MORNING, AT NOON, AND AT BEDTIME. 09/03/21 10/23/22  [name], [name], MD  glucose blood (ACCU-CHEK GUIDE) test strip Check blood sugar 3 times a day 06/11/20   Quincy Simmonds, MD  insulin aspart (NOVOLOG) 100 UNIT/ML injection Inject 10 Units into the skin 3 (three) times daily with meals. 11/03/22   Shelby Mattocks, DO  insulin glargine-yfgn (SEMGLEE) 100 UNIT/ML injection Inject 0.3 mLs (30 Units total) into the skin daily. 11/04/22   Shelby Mattocks, DO  metoprolol succinate (TOPROL XL) 25 MG 24 hr tablet Take 1 tablet (25 mg total) by mouth at bedtime. 10/06/21   Elige Radon, MD  nystatin (MYCOSTATIN/NYSTOP) powder Apply 1 Application topically 3 (three) times daily. 10/23/22   Gwyneth Sprout, MD  pantoprazole (PROTONIX) 40 MG tablet TAKE 1 TABLET (40 MG TOTAL) BY MOUTH DAILY. 09/03/21 10/23/22  Elige Radon, MD  valsartan (DIOVAN) 80 MG tablet Take 1 tablet (80 mg total) by mouth daily. 10/06/21   Elige Radon, MD  buPROPion (WELLBUTRIN XL) 150 MG 24 hr tablet Take 1 tablet (150 mg total) by mouth every morning. 06/04/21 06/13/21  Elige Radon, MD      Allergies    Shellfish allergy    Review of Systems   Review of Systems  Cardiovascular:  Positive for chest pain.    Physical Exam Updated Vital Signs BP 125/77   Pulse 60   Temp 98.2 F (36.8 C)   Resp 20   Ht 5\' 10"  (1.778 m)   Wt (!) 147.4 kg   SpO2 95%   BMI 46.63 kg/m  Physical Exam  ED Results / Procedures / Treatments   Labs (all labs ordered are listed, but only abnormal results are displayed) Labs Reviewed  BASIC METABOLIC PANEL - Abnormal; Notable for the following components:      Result Value   CO2  21 (*)    BUN 26 (*)    Creatinine, Ser 1.31 (*)    Anion gap 17 (*)    All other components within normal limits  CBC - Abnormal; Notable for the following components:   RBC 4.17 (*)    Hemoglobin 12.9 (*)    All other components within normal limits  TROPONIN I (HIGH SENSITIVITY)  TROPONIN I (HIGH SENSITIVITY)    EKG None  Radiology DG Chest 2 View  Result Date: 02/19/2023 CLINICAL DATA:  Chest pain. Feels like someone is stabbing him in the back. Shortness of breath. EXAM: CHEST - 2 VIEW COMPARISON:  10/23/2022 FINDINGS: Cardiac pacemaker. Shallow inspiration. Heart size and pulmonary vascularity are normal. No airspace disease or consolidation in the lungs. No pleural effusions. No pneumothorax. Lungs are clear. Mediastinal contours appear intact. IMPRESSION: No active cardiopulmonary disease. Electronically Signed   By: Burman Nieves M.D.   On: 02/19/2023 20:05    Procedures Procedures    Medications Ordered in ED Medications - No data to display  ED Course/ Medical Decision Making/ A&P                                 Medical Decision Making Amount and/or Complexity of Data Reviewed Labs: ordered. Radiology: ordered.   [name] 57 y.o. presented today for chest pain. Working DDx that I considered at this time includes, but not limited to, ACS, GERD, pe, pna, aortic dissection, pneumothorax, MSK path, anemia, esophageal rupture, CHF exacerbation, valvular disorder, myocarditis, pericarditis, endocarditis, pericardial effusion/cardiac tamponade, pulmonary edema, gastritis/PUD, esophagitis.  R/o Dx:   PE: advanced imaging will not be ordered as alternative diagnoses are more likely at this time Aortic Dissection: less likely based on the history of present illness - location, quality, onset, and severity of symptoms in this case.   Review of prior external notes: Reviewed the ED visit 01/30/2023  Unique Tests and My Interpretation:  EKG: Rate, rhythm, axis,  intervals all examined: EKG shows dual paced rhythm. Troponin: Normal limits, second troponin is pending CXR: No acute pathology CBC: Unremarkable, hemoglobin 12.9, no elevated white blood cell count BMP: Unremarkable, labs consistent with recent labs.  Point-of-care CBG within normal limits, 86  Problem List / ED Course / Critical interventions / Medication management  Patient reported to the emergency room with chest pain that started at 3 AM.  Chest pain is worse with movement reproducible on palpation.  Patient has not experienced pain like this in the past.  Given that patient had significant risk factors repeated 2 troponins as long as both troponins are negative okay with patient following up outpatient.  Return to the emergency room if he has  any new or worsening symptoms.  He was hemodynamically stable throughout visit.  Reevaluation of the patient after these medicines showed that the patient stayed the same Patients vitals assessed. Upon arrival patient is  hemodynamically stable.  I have reviewed the patients home medicines and have made adjustments as needed     Plan: Signed off to oncoming ED provider F/u w/ PCP to ensure resolution of sx.  Patient was given return precautions. Patient stable for discharge at this time.  Patient educated on sx/ dx and verbalized understanding of plan.  Will return to ER w/ new or worsening sx.          Final Clinical Impression(s) / ED Diagnoses Final diagnoses:  None    Rx / DC Orders ED Discharge Orders     None         Reinaldo Raddle 02/19/23 2148    Derwood Kaplan, MD 02/20/23 1157

## 2023-02-19 NOTE — ED Triage Notes (Signed)
Pt bib ems c.o chest pain that started at 3am, left sided radiating down his left arm and shoulder blade.  1 nitroglycerin and 324 ASA given PTA.  Paced rhythm.

## 2023-02-19 NOTE — ED Provider Notes (Signed)
Care assumed at 2300.  Pt here for evaluation of left sided chest pain that started last night.  Pain radiates to the left arm.  Care assumed pending repeat troponin.   EKG with paced rhythm.  Troponin neg x2.  He has chest wall tenderness on exam, symmetric radial and DP pulses.  He has full ROM in the LUE.  No abdominal tenderness.    Suspect MSK pain.  Current clinical picture is not c/w ACS, dissection, PE, acute abdomen, CVA.  Plan to d/c home with outpatient follow up and return precautions.     Tilden Fossa, MD 02/20/23 0700

## 2023-02-20 LAB — CBG MONITORING, ED: Glucose-Capillary: 84 mg/dL (ref 70–99)

## 2023-02-20 NOTE — ED Notes (Signed)
Patient updated on plan of care

## 2023-04-15 ENCOUNTER — Emergency Department (HOSPITAL_COMMUNITY)
Admission: EM | Admit: 2023-04-15 | Discharge: 2023-04-16 | Disposition: A | Discharge status: Home / Self Care | Payer: Medicare HMO | Attending: Emergency Medicine | Admitting: Emergency Medicine

## 2023-04-15 ENCOUNTER — Other Ambulatory Visit: Payer: Self-pay

## 2023-04-15 ENCOUNTER — Emergency Department (HOSPITAL_COMMUNITY): Payer: Medicare HMO

## 2023-04-15 ENCOUNTER — Encounter (HOSPITAL_COMMUNITY): Payer: Self-pay

## 2023-04-15 DIAGNOSIS — Z79899 Other long term (current) drug therapy: Secondary | ICD-10-CM | POA: Diagnosis not present

## 2023-04-15 DIAGNOSIS — I110 Hypertensive heart disease with heart failure: Secondary | ICD-10-CM | POA: Diagnosis not present

## 2023-04-15 DIAGNOSIS — I509 Heart failure, unspecified: Secondary | ICD-10-CM | POA: Diagnosis not present

## 2023-04-15 DIAGNOSIS — Z7901 Long term (current) use of anticoagulants: Secondary | ICD-10-CM | POA: Diagnosis not present

## 2023-04-15 DIAGNOSIS — Z794 Long term (current) use of insulin: Secondary | ICD-10-CM | POA: Insufficient documentation

## 2023-04-15 DIAGNOSIS — J45909 Unspecified asthma, uncomplicated: Secondary | ICD-10-CM | POA: Insufficient documentation

## 2023-04-15 DIAGNOSIS — M25512 Pain in left shoulder: Secondary | ICD-10-CM | POA: Insufficient documentation

## 2023-04-15 DIAGNOSIS — E119 Type 2 diabetes mellitus without complications: Secondary | ICD-10-CM | POA: Diagnosis not present

## 2023-04-15 MED ORDER — LIDOCAINE 5 % EX PTCH: 1.0000 | MEDICATED_PATCH | CUTANEOUS | 0 refills | Status: DC

## 2023-04-15 MED ORDER — METHOCARBAMOL 500 MG PO TABS
500.0000 mg | ORAL_TABLET | Freq: Two times a day (BID) | ORAL | 0 refills | Status: AC
Start: 2023-04-15 — End: ?

## 2023-04-15 MED ORDER — NAPROXEN 500 MG PO TABS: 500.0000 mg | ORAL_TABLET | Freq: Two times a day (BID) | ORAL | 0 refills | Status: DC

## 2023-04-15 MED ORDER — HYDROCODONE-ACETAMINOPHEN 5-325 MG PO TABS
1.0000 | ORAL_TABLET | Freq: Once | ORAL | Status: AC
  Administered 2023-04-15: 1 via ORAL
  Filled 2023-04-15: qty 1

## 2023-04-15 NOTE — ED Notes (Signed)
[facility] called at  this time.

## 2023-04-15 NOTE — ED Triage Notes (Signed)
BIBA from [facility] with c/o left shoulder pain radiating into back and bicep after working out 1 week ago.  Pain increases with palpitation and movement.  A&O x4. Ambulatory at baseline.

## 2023-04-15 NOTE — Discharge Instructions (Addendum)
You are seen in the emergency room for left shoulder pain.  I would recommend alternating Tylenol and Naproxen for pain control.  Please use ice or heat over the area.  Send muscle relaxer to pharmacy, continue using lidocaine patch.  Please follow-up with primary care to ensure resolution of symptoms. Return to emergency room with new or worsening symptoms.

## 2023-04-15 NOTE — ED Provider Notes (Signed)
[place] EMERGENCY DEPARTMENT AT [facility] Provider Note   CSN: [id number] Arrival date & time: 04/15/23  1452     History  Chief Complaint  Patient presents with   Shoulder Pain    Allen Gould is a 57 y.o. male with past medical history of heart failure with preserved ejection fraction, diabetes, asthma, hypertension presenting to emergency room with left shoulder pain.  Patient reports he has been doing physical therapy and his arm started getting sore after working with physeal ball.  Pain increases when he is lying on his shoulder and increases with movement.  Reports trying ibuprofen and Tylenol and pain continues. Denies shortness of breath, chest pain, cough, weakness or change in sensation. No fall or trauma.    Shoulder Pain      Home Medications Prior to Admission medications   Medication Sig Start Date End Date Taking? Authorizing Provider  Accu-Chek Softclix Lancets lancets Check blood sugar 3 times a day 06/11/20   Quincy Simmonds, MD  amLODipine (NORVASC) 10 MG tablet Take 1 tablet (10 mg total) by mouth daily. 10/06/21 10/23/22  Elige Radon, MD  apixaban (ELIQUIS) 5 MG TABS tablet Take 1 tablet (5 mg total) by mouth 2 (two) times daily. 01/30/21   Quintella Reichert, MD  atorvastatin (LIPITOR) 80 MG tablet TAKE 1 TABLET (80 MG TOTAL) BY MOUTH DAILY. 07/01/21   Elige Radon, MD  diclofenac Sodium (VOLTAREN) 1 % GEL Apply 4 g topically 4 (four) times daily -  before meals and at bedtime. 01/30/22   Mapp, Gaylyn Cheers, MD  Dulaglutide (TRULICITY) 1.5 MG/0.5ML SOPN Inject 1.5 mg into the skin once a week.    [provider]  DULoxetine (CYMBALTA) 60 MG capsule Take 2 capsules (120 mg total) by mouth daily. 11/04/22   Shelby Mattocks, DO  Fluticasone Furoate (ARNUITY ELLIPTA) 100 MCG/ACT AEPB Inhale 1 puff into the lungs daily. 11/03/22   Shelby Mattocks, DO  furosemide (LASIX) 40 MG tablet Take 1 tablet (40 mg total) by mouth daily. 11/04/22   Shelby Mattocks,  DO  gabapentin (NEURONTIN) 300 MG capsule TAKE 1 CAPSULE (300 MG TOTAL) BY MOUTH IN THE MORNING, AT NOON, AND AT BEDTIME. 09/03/21 10/23/22  [name], [name], MD  glucose blood (ACCU-CHEK GUIDE) test strip Check blood sugar 3 times a day 06/11/20   Quincy Simmonds, MD  insulin aspart (NOVOLOG) 100 UNIT/ML injection Inject 10 Units into the skin 3 (three) times daily with meals. 11/03/22   Shelby Mattocks, DO  insulin glargine-yfgn (SEMGLEE) 100 UNIT/ML injection Inject 0.3 mLs (30 Units total) into the skin daily. 11/04/22   Shelby Mattocks, DO  metoprolol succinate (TOPROL XL) 25 MG 24 hr tablet Take 1 tablet (25 mg total) by mouth at bedtime. 10/06/21   Elige Radon, MD  nystatin (MYCOSTATIN/NYSTOP) powder Apply 1 Application topically 3 (three) times daily. 10/23/22   Gwyneth Sprout, MD  pantoprazole (PROTONIX) 40 MG tablet TAKE 1 TABLET (40 MG TOTAL) BY MOUTH DAILY. 09/03/21 10/23/22  Elige Radon, MD  valsartan (DIOVAN) 80 MG tablet Take 1 tablet (80 mg total) by mouth daily. 10/06/21   Elige Radon, MD  buPROPion (WELLBUTRIN XL) 150 MG 24 hr tablet Take 1 tablet (150 mg total) by mouth every morning. 06/04/21 06/13/21  Elige Radon, MD      Allergies    Shellfish allergy    Review of Systems   Review of Systems  Musculoskeletal:        Left shoulder pain    Physical  Exam Updated Vital Signs BP (!) 139/93 (BP Location: Left Arm)   Pulse 75   Temp 98.3 F (36.8 C) (Oral)   Resp 16   Wt (!) 147 kg   SpO2 99%   BMI 46.50 kg/m  Physical Exam Vitals and nursing note reviewed.  Constitutional:      General: He is not in acute distress.    Appearance: He is not toxic-appearing.  HENT:     Head: Normocephalic and atraumatic.  Eyes:     General: No scleral icterus.    Conjunctiva/sclera: Conjunctivae normal.  Cardiovascular:     Rate and Rhythm: Normal rate and regular rhythm.     Pulses: Normal pulses.     Heart sounds: Normal heart sounds.  Pulmonary:     Effort:  Pulmonary effort is normal. No respiratory distress.     Breath sounds: Normal breath sounds.  Abdominal:     General: Abdomen is flat. Bowel sounds are normal.     Palpations: Abdomen is soft.     Tenderness: There is no abdominal tenderness.  Musculoskeletal:     Comments: Able to move left upper extremity, reports mild pain.  Strength equal bilaterally neurovascularly intact. Tenderness over shoulder and between shoulder blades.  No area of edema or erythema.  Skin:    General: Skin is warm and dry.     Findings: No lesion.  Neurological:     General: No focal deficit present.     Mental Status: He is alert and oriented to person, place, and time. Mental status is at baseline.     ED Results / Procedures / Treatments   Labs (all labs ordered are listed, but only abnormal results are displayed) Labs Reviewed - No data to display  EKG None  Radiology DG Shoulder Left  Result Date: 04/15/2023 CLINICAL DATA:  Left shoulder pain EXAM: LEFT SHOULDER - 3 VIEW COMPARISON:  None Available. FINDINGS: Films are under penetrated. No obvious fracture or dislocation. Preserved joint spaces. Battery pack seen from pacemaker obscuring the shoulder on the AP view. IMPRESSION: Limited radiographs.  Grossly no acute osseous abnormality. Electronically Signed   By: Karen Kays M.D.   On: 04/15/2023 17:16    Procedures Procedures    Medications Ordered in ED Medications  HYDROcodone-acetaminophen (NORCO/VICODIN) 5-325 MG per tablet 1 tablet (1 tablet Oral Given 04/15/23 1625)    ED Course/ Medical Decision Making/ A&P                                 Medical Decision Making Amount and/or Complexity of Data Reviewed Radiology: ordered.  Risk Prescription drug management.   This patient presents to the ED for concern of left shoulder pain, this involves an extensive number of treatment options, and is a complaint that carries with it a high risk of complications and morbidity.  The  differential diagnosis includes contusion, sprain, fractures, tendonitis    Co morbidities that complicate the patient evaluation  heart failure with preserved ejection fraction, diabetes, asthma, hypertension   Additional history obtained:  Additional history obtained from 02/19/2023   Lab Tests:  None    Imaging Studies ordered:  I ordered imaging studies including left shoulder   I independently visualized and interpreted imaging which showed no acute fracture  I agree with the radiologist interpretation    Consultations Obtained:  None    Problem List / ED Course / Critical interventions / Medication  management  Patient reporting to emergency room with left shoulder pain.  Patient reports this occurred after physical therapy.  Exam consistent with musculoskeletal pain.  Patient is neurovascularly intact with equal radial pulse readings.  Patient does not have surrounding erythema suggesting that this is from infection or septic joint.  Patient is not having any chest pain thus I do not feel related to ACS, no headache, overall well-appearing in no acute distress.  Patient is improved after pain medicine occasion I will send him home with outpatient pain management options and follow-up primary care for further evaluation of the symptoms. I ordered medication including Norco  for pain  Reevaluation of the patient after these medicines showed that the patient improved I have reviewed the patients home medicines and have made adjustments as needed   Plan  F/u w/ PCP in 2-3d to ensure resolution of sx.  Patient was given return precautions. Patient stable for discharge at this time.  Patient educated on sx/dx and verbalized understanding of plan. Return to ER w/ new or worsening sx.          Final Clinical Impression(s) / ED Diagnoses Final diagnoses:  Acute pain of left shoulder    Rx / DC Orders ED Discharge Orders     None         Neithan Day, Horald Chestnut,  PA-C 04/15/23 2207    Elayne Snare K, DO 04/16/23 [phone]

## 2023-05-23 ENCOUNTER — Encounter: Payer: Self-pay | Admitting: Orthopaedic Surgery

## 2023-05-23 ENCOUNTER — Ambulatory Visit: Payer: Medicare HMO | Admitting: Orthopaedic Surgery

## 2023-05-23 DIAGNOSIS — G8929 Other chronic pain: Secondary | ICD-10-CM | POA: Diagnosis not present

## 2023-05-23 DIAGNOSIS — M25512 Pain in left shoulder: Secondary | ICD-10-CM | POA: Diagnosis not present

## 2023-05-23 NOTE — Progress Notes (Signed)
Office Visit Note   Patient: Allen Gould           Date of Birth: [birth year]           MRN: [id number] Visit Date: 05/23/2023              Requested by: Sharmon Revere, MD [address] [place],  [place] [address] PCP: Sharmon Revere, MD   Assessment & Plan: Visit Diagnoses:  1. Chronic left shoulder pain     Plan: Allen Gould is a 57 year old gentleman with left scapular trigger points.  This is likely exacerbated by the fact that he is a wheelchair ambulator.  I have ordered physical therapy with modalities and dry needling at his facility.  Follow-up as needed.  Follow-Up Instructions: No follow-ups on file.   Orders:  No orders of the defined types were placed in this encounter.  No orders of the defined types were placed in this encounter.     Procedures: No procedures performed   Clinical Data: No additional findings.   Subjective: Chief Complaint  Patient presents with   Left Shoulder - Pain    HPI Allen Gould is a 57 year old gentleman here for evaluation of left shoulder pain for 2 months.  Feels pain around the shoulder blade with occasional tingling and numbness down to the hand.  He has been in a wheelchair for 2 years. Review of Systems  Constitutional: Negative.   HENT: Negative.    Eyes: Negative.   Respiratory: Negative.    Cardiovascular: Negative.   Gastrointestinal: Negative.   Endocrine: Negative.   Genitourinary: Negative.   Skin: Negative.   Allergic/Immunologic: Negative.   Neurological: Negative.   Hematological: Negative.   Psychiatric/Behavioral: Negative.    All other systems reviewed and are negative.    Objective: Vital Signs: There were no vitals taken for this visit.  Physical Exam Vitals and nursing note reviewed.  Constitutional:      Appearance: He is well-developed.  HENT:     Head: Normocephalic and atraumatic.  Eyes:     Pupils: Pupils are equal, round, and reactive to light.  Pulmonary:     Effort:  Pulmonary effort is normal.  Abdominal:     Palpations: Abdomen is soft.  Musculoskeletal:        General: Normal range of motion.     Cervical back: Neck supple.  Skin:    General: Skin is warm.  Neurological:     Mental Status: He is alert and oriented to person, place, and time.  Psychiatric:        Behavior: Behavior normal.        Thought Content: Thought content normal.        Judgment: Judgment normal.     Ortho Exam Exam of the left shoulder shows tenderness along the scapular rhomboids.  Otherwise exam is normal. Specialty Comments:  No specialty comments available.  Imaging: No results found.   PMFS History: Patient Active Problem List   Diagnosis Date Noted   COPD exacerbation (HCC) 10/30/2022   Severe episode of recurrent major depressive disorder, without psychotic features (HCC) 10/28/2022   CAP (community acquired pneumonia) 10/25/2022   Substance abuse (HCC) 10/24/2022   Shortness of breath 10/23/2022   Scrotal swelling 10/23/2022   Housing instability after recent homelessness 10/23/2022   Intertrigo 10/23/2022   Acute renal failure superimposed on stage 3a chronic kidney disease (HCC) 01/28/2022   Acute renal failure superimposed on chronic kidney disease (HCC) 01/28/2022   Acute  cystitis without hematuria    Stage 3a chronic kidney disease (CKD) (HCC) 10/07/2021   Chest pain 09/17/2021   CAD (coronary artery disease) 06/30/2021   Hypertrophic cardiomyopathy (HCC) 03/25/2021   Cardiac pacemaker in situ 03/25/2021   Normocytic anemia 11/14/2020   Acquired dilation of ascending aorta and aortic root (HCC)    Aortic stenosis    Hip pain 2/2 osteoarthritis 06/12/2020   Tachycardia-bradycardia syndrome (HCC) 06/05/2020   (HFpEF) heart failure with preserved ejection fraction (HCC) 06/02/2020   Severe depression (HCC) 04/24/2020   Physical deconditioning 04/24/2020   Second degree AV block, Mobitz type I    Bifascicular block    PAC (premature atrial  contraction)    PVCs (premature ventricular contractions)    PAF (paroxysmal atrial fibrillation) (HCC)    Essential hypertension 10/22/2016   Health care maintenance 03/01/2014   Morbid obesity (HCC) 08/29/2013   Type II diabetes mellitus (HCC) 08/10/2013   Asthma    GERD (gastroesophageal reflux disease)    Past Medical History:  Diagnosis Date   Acquired dilation of ascending aorta and aortic root (HCC)    42mm by echo 09/2020 but normal on Chest CTA 05/2020   Acute kidney injury (HCC) 09/01/2019   Asthma    Balanitis 08/09/2014   Bifascicular block    Cough 08/09/2014   Diabetes mellitus without complication (HCC)    Dilated aortic root (HCC)    42mm on echo 09/2020   DM (diabetes mellitus) type 2, uncontrolled, without ketoacidosis 08/10/2013   Dyslipidemia 03/01/2014   Financial difficulties 08/09/2014   GERD (gastroesophageal reflux disease)    Health care maintenance 03/01/2014   HOCM (hypertrophic obstructive cardiomyopathy) (HCC)    noted on cMRI but no SAM or LVOT gradient   Hypertension    Hypomagnesemia 05/29/2017   Insomnia 08/29/2013   Mitral stenosis    mean MVG   Morbid obesity (HCC) 08/29/2013   Near syncope 05/29/2017   Odontogenic infection of jaw 05/29/2017   PAC (premature atrial contraction)    PAF (paroxysmal atrial fibrillation) (HCC)    Pressure injury of skin 09/02/2019   PVCs (premature ventricular contractions)    Right shoulder pain 08/29/2013   Second degree AV block, Mobitz type I    Sepsis (HCC) 05/29/2017   Sleep apnea    uses cpap    Family History  Problem Relation Age of Onset   Kidney disease Mother    Diabetes Sister    Hyperlipidemia Sister    Heart disease Other     Past Surgical History:  Procedure Laterality Date   APPENDECTOMY     MASS EXCISION N/A 08/19/2020   Procedure: EXCISION OF SCROTAL CYSTS;  Surgeon: Noel Christmas, MD;  Location: [facility];  Service: Urology;  Laterality: N/A;  1 HR   PACEMAKER IMPLANT  N/A 06/24/2020   Procedure: PACEMAKER IMPLANT;  Surgeon: Lanier Prude, MD;  Location: [facility] INVASIVE CV LAB;  Service: Cardiovascular;  Laterality: N/A;   Social History   Occupational History   Not on file  Tobacco Use   Smoking status: Former    Current packs/day: 0.00    Types: Cigarettes    Quit date: 04/24/2005    Years since quitting: 18.0   Smokeless tobacco: Never  Vaping Use   Vaping status: Never Used  Substance and Sexual Activity   Alcohol use: Not Currently    Alcohol/week: 2.0 standard drinks of alcohol    Types: 2 Cans of beer per week  Comment: Last drink 8 months ago   Drug use: No   Sexual activity: Not on file

## 2023-06-22 IMAGING — MR MR CARD MORPHOLOGY WO/W CM
43 of 48 series · 43 of 48 positions shown · IV contrast (gadavist)
Comparison: none

CLINICAL DATA: Clinical question of amyloidosis
Study assumes BSA

EXAM:
CARDIAC MRI
TECHNIQUE: The patient was scanned on a 1.5 Tesla GE magnet. A dedicated
cardiac coil was used. Functional imaging was done using Fiesta
sequences. [DATE], and 4 chamber views were done to assess for RWMA's.
Modified Wa Fa rule using a short axis stack was used to
calculate an ejection fraction on a dedicated work station using
Circle software. The patient received 10 cc of Gadavist. After 10
minutes inversion recovery sequences were used to assess for
infiltration and scar tissue.
CONTRAST:  10 cc  of Gadavist

[Series 4: [id number] · axial · 8.0mm · 1.41mm/px · 1 of 18 slices shown]
[im 1/18]
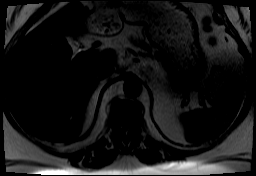

[Series 10: bSSFP · sagittal · 8.0mm · 2.05mm/px · 1 of 25 slices shown (1 of 24)]
[im 1/25]
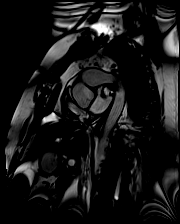

[Series 11: bSSFP · sagittal · 8.0mm · 2.05mm/px · 1 of 25 slices shown (2 of 24)]
[im 1/25]
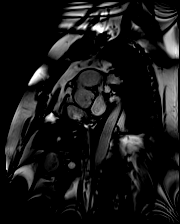

[Series 12: bSSFP · sagittal · 8.0mm · 2.05mm/px · 1 of 25 slices shown (3 of 24)]
[im 1/25]
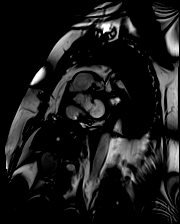

[Series 13: bSSFP · sagittal · 8.0mm · 2.05mm/px · 1 of 25 slices shown (4 of 24)]
[im 1/25]
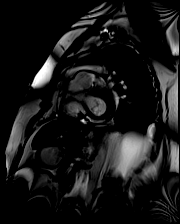

[Series 14: bSSFP · sagittal · 8.0mm · 2.05mm/px · 1 of 25 slices shown (5 of 24)]
[im 1/25]
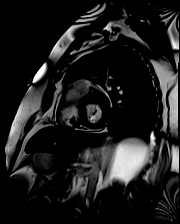

[Series 15: bSSFP · sagittal · 8.0mm · 2.05mm/px · 1 of 25 slices shown (6 of 24)]
[im 1/25]
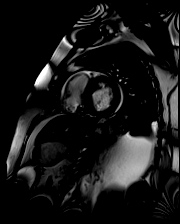

[Series 16: bSSFP · sagittal · 8.0mm · 2.05mm/px · 1 of 25 slices shown (7 of 24)]
[im 1/25]
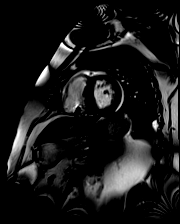

[Series 17: bSSFP · sagittal · 8.0mm · 2.05mm/px · 1 of 25 slices shown (8 of 24)]
[im 1/25]
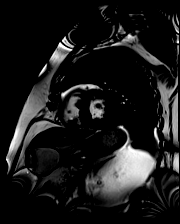

[Series 18: bSSFP · sagittal · 8.0mm · 2.05mm/px · 1 of 25 slices shown (9 of 24)]
[im 1/25]
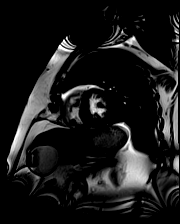

[Series 19: bSSFP · sagittal · 8.0mm · 2.05mm/px · 1 of 25 slices shown (10 of 24)]
[im 1/25]
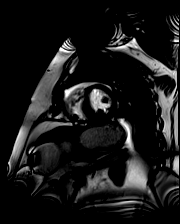

[Series 20: bSSFP · sagittal · 8.0mm · 2.05mm/px · 1 of 25 slices shown (11 of 24)]
[im 1/25]
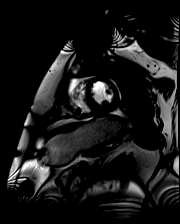

[Series 21: bSSFP · sagittal · 8.0mm · 2.05mm/px · 1 of 25 slices shown (12 of 24)]
[im 1/25]
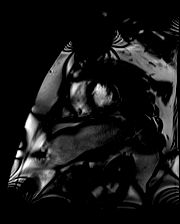

[Series 22: bSSFP · sagittal · 8.0mm · 2.05mm/px · 1 of 25 slices shown (13 of 24)]
[im 1/25]
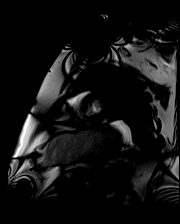

[Series 23: bSSFP · sagittal · 8.0mm · 2.05mm/px · 1 of 25 slices shown (14 of 24)]
[im 1/25]
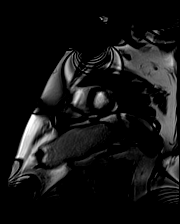

[Series 24: bSSFP · sagittal · 8.0mm · 2.05mm/px · 1 of 25 slices shown (15 of 24)]
[im 1/25]
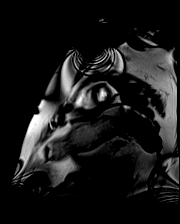

[Series 25: bSSFP · sagittal · 8.0mm · 2.05mm/px · 1 of 25 slices shown (16 of 24)]
[im 1/25]
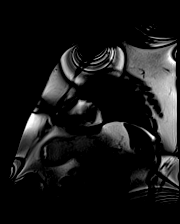

[Series 26: bSSFP · sagittal · 8.0mm · 2.05mm/px · 1 of 25 slices shown (17 of 24)]
[im 1/25]
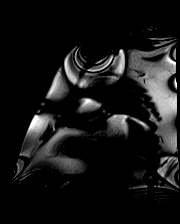

[Series 27: bSSFP · sagittal · 8.0mm · 2.05mm/px · 1 of 25 slices shown (18 of 24)]
[im 1/25]
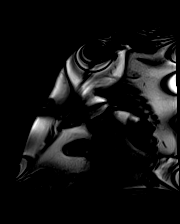

[Series 28: (id)_long_t1 · sagittal · 8.0mm · 1.72mm/px · 1 of 24 slices shown]
[im 1/24]
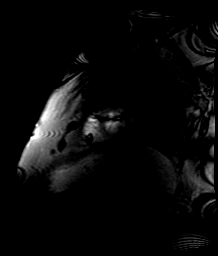

[Series 29: (id)_long_t1_moco · sagittal · 8.0mm · 1.72mm/px · 1 of 24 slices shown]
[im 1/24]
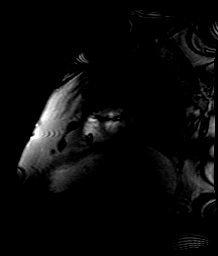

[Series 32: (id)_trufi · sagittal · 8.0mm · 2.08mm/px · 1 of 9 slices shown]
[im 1/9]
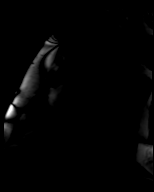

[Series 33: (id)_trufi_moco · sagittal · 8.0mm · 2.08mm/px · 1 of 9 slices shown]
[im 1/9]
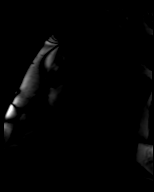

[Series 36: bSSFP · axial · 6.0mm · 1.41mm/px · 1 of 25 slices shown (19 of 24)]
[im 1/25]
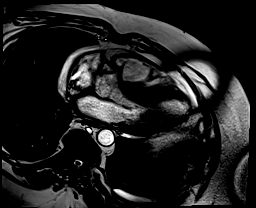

[Series 37: bSSFP · coronal · 6.0mm · 1.41mm/px · 1 of 25 slices shown (20 of 24)]
[im 1/25]
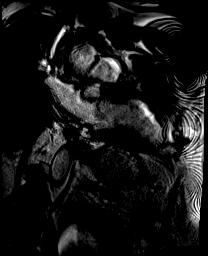

[Series 38: bSSFP · axial · 6.0mm · 1.41mm/px · 1 of 25 slices shown (21 of 24)]
[im 1/25]
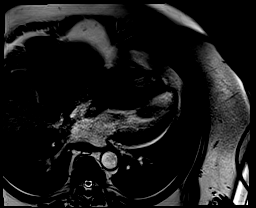

[Series 39: pre short axis · sagittal · non-contrast · 8.0mm · 2.75mm/px · 1 of 10 slices shown (1 of 6)]
[im 1/10]
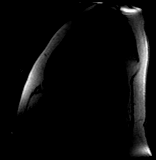

[Series 40: pre short axis · sagittal · non-contrast · 8.0mm · 2.75mm/px · 1 of 10 slices shown (2 of 6)]
[im 1/10]
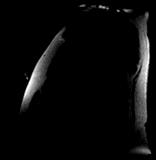

[Series 41: pre short axis · sagittal · non-contrast · 8.0mm · 2.75mm/px · 1 of 10 slices shown (3 of 6)]
[im 1/10]
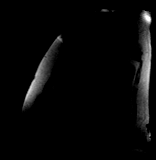

[Series 42: pre short axis · sagittal · non-contrast · 8.0mm · 2.75mm/px · 1 of 10 slices shown (4 of 6)]
[im 1/10]
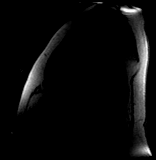

[Series 43: pre short axis · sagittal · non-contrast · 8.0mm · 2.75mm/px · 1 of 10 slices shown (5 of 6)]
[im 1/10]
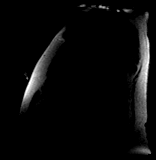

[Series 44: pre short axis · sagittal · non-contrast · 8.0mm · 2.75mm/px · 1 of 10 slices shown (6 of 6)]
[im 1/10]
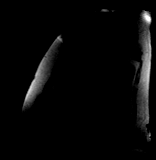

[Series 45: rest short axis · sagittal · 8.0mm · 2.75mm/px · 1 of 60 slices shown (1 of 6)]
[im 1/60]
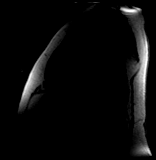

[Series 46: rest short axis · sagittal · 8.0mm · 2.75mm/px · 1 of 60 slices shown (2 of 6)]
[im 1/60]
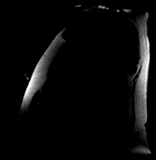

[Series 47: rest short axis · sagittal · 8.0mm · 2.75mm/px · 1 of 60 slices shown (3 of 6)]
[im 1/60]
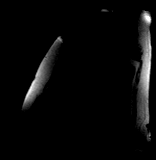

[Series 48: rest short axis · sagittal · 8.0mm · 2.75mm/px · 1 of 60 slices shown (4 of 6)]
[im 1/60]
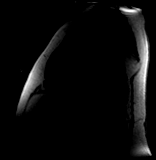

[Series 49: rest short axis · sagittal · 8.0mm · 2.75mm/px · 1 of 60 slices shown (5 of 6)]
[im 1/60]
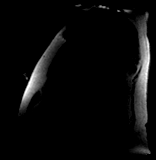

[Series 50: rest short axis · sagittal · 8.0mm · 2.75mm/px · 1 of 60 slices shown (6 of 6)]
[im 1/60]
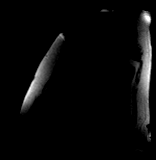

[Series 51: bSSFP · sagittal · 6.0mm · 2.12mm/px · 1 of 20 slices shown (22 of 24)]
[im 1/20]
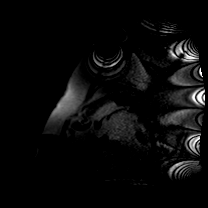

[Series 52: bSSFP · sagittal · 6.0mm · 2.12mm/px · 1 of 20 slices shown (23 of 24)]
[im 1/20]
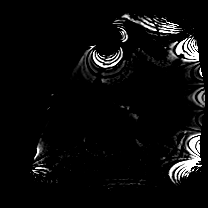

[Series 53: bSSFP · coronal · 6.0mm · 1.76mm/px · 1 of 25 slices shown (24 of 24)]
[im 1/25]
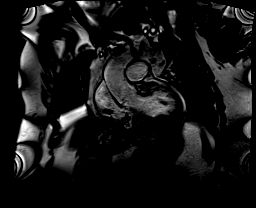

[Series 54: aortic valve cine · oblique · 6.0mm · 1.41mm/px · 1 of 25 slices shown]
[im 1/25]
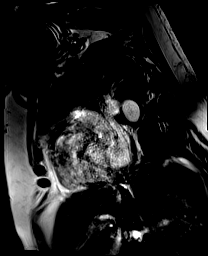

[Series 55: cine rvit · coronal · 6.0mm · 1.41mm/px · 1 of 25 slices shown]
[im 1/25]
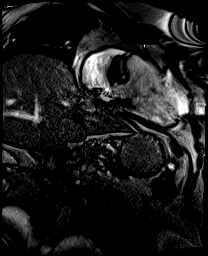

[43 of 48 positions shown; findings below may reference images not displayed]

FINDINGS: 1. Normal left ventricular size, with LVEDD 46 mm, and LVEDVi 61
mL/m2.

Increased left ventricular thickness, with intraventricular septal
thickness of 17 mm, posterior wall thickness of 11 mm, and septal to
posterior ratio 1.5.

Myocardial mass index of 72 g/m2 (normal).

Normal left ventricular systolic function (LVEF = 59%). There are no
regional wall motion abnormalities. No evidence of systolic anterior
motion of the mitral valve.

Left ventricular parametric mapping notable for normal T1 signal
(apical anterior is likely artifact) and normal T2 signal.

There is late gadolinium enhancement in the left ventricular
myocardium: Patchy LGE seen in the anteroseptal base comprising
percent of total myocardium.

2. Normal right ventricular size with RVEDVI 79 mL/m2.

Normal right ventricular thickness.

Normal right ventricular systolic function (RVEF =47%). There are no
regional wall motion abnormalities or aneurysms.

3.  Normal left and right atrial size.

4.  Normal size of the aortic root, ascending aorta.

Severe dilation of the main pulmonary artery 35 mm.

5.  Grossly, no significant valvular abnormalities.

6.  Normal pericardium.  No pericardial effusion.

7. Grossly, no extracardiac findings; pacemaker in place with RV
lead leading to artifact in some series. Recommended dedicated study
if concerned for non-cardiac pathology.

8.  Device artifact noted.
IMPRESSION: Normal Native T1 signal, ability to null the myocardium, normal
atrial size and LGE pattern are not consistent with cardiac
amyloidosis.

In lieu of systemic disease that would cause hypertrophy, study
consistent with hypertrophic cardiomyopathy.

## 2023-09-20 ENCOUNTER — Other Ambulatory Visit: Payer: Self-pay

## 2023-09-20 ENCOUNTER — Emergency Department (HOSPITAL_COMMUNITY)

## 2023-09-20 ENCOUNTER — Encounter (HOSPITAL_COMMUNITY): Payer: Self-pay

## 2023-09-20 ENCOUNTER — Emergency Department (HOSPITAL_COMMUNITY)
Admission: EM | Admit: 2023-09-20 | Discharge: 2023-09-20 | Disposition: A | Discharge status: Home / Self Care | Attending: Emergency Medicine | Admitting: Emergency Medicine

## 2023-09-20 DIAGNOSIS — Z794 Long term (current) use of insulin: Secondary | ICD-10-CM | POA: Insufficient documentation

## 2023-09-20 DIAGNOSIS — M25531 Pain in right wrist: Secondary | ICD-10-CM | POA: Insufficient documentation

## 2023-09-20 DIAGNOSIS — N189 Chronic kidney disease, unspecified: Secondary | ICD-10-CM | POA: Insufficient documentation

## 2023-09-20 DIAGNOSIS — Z7951 Long term (current) use of inhaled steroids: Secondary | ICD-10-CM | POA: Insufficient documentation

## 2023-09-20 DIAGNOSIS — Z7901 Long term (current) use of anticoagulants: Secondary | ICD-10-CM | POA: Insufficient documentation

## 2023-09-20 DIAGNOSIS — E119 Type 2 diabetes mellitus without complications: Secondary | ICD-10-CM | POA: Insufficient documentation

## 2023-09-20 DIAGNOSIS — J45909 Unspecified asthma, uncomplicated: Secondary | ICD-10-CM | POA: Insufficient documentation

## 2023-09-20 DIAGNOSIS — X501XXA Overexertion from prolonged static or awkward postures, initial encounter: Secondary | ICD-10-CM | POA: Diagnosis not present

## 2023-09-20 DIAGNOSIS — M79641 Pain in right hand: Secondary | ICD-10-CM | POA: Insufficient documentation

## 2023-09-20 NOTE — ED Notes (Signed)
 Placed pt on take home list at this time. Allen Gould

## 2023-09-20 NOTE — Discharge Instructions (Signed)
 Today you were seen for right wrist pain.  You may wear your brace as needed for comfort.  You may take Tylenol  as needed for pain and swelling.  Please follow-up with your PCP if your pain persist for further evaluation and workup.  Thank you for letting us  treat you today. After reviewing your imaging, I feel you are safe to go home. Please follow up with your PCP in the next several days and provide them with your records from this visit. Return to the Emergency Room if pain becomes severe or symptoms worsen.

## 2023-09-20 NOTE — ED Triage Notes (Signed)
 Pt hurt wrist about 5 days ago when trying to push himself up a hill. Has not taken anything for pain since this morning. Pain rated 10/10

## 2023-09-20 NOTE — ED Provider Notes (Signed)
 [facility] Provider Note   CSN: [id number] Arrival date & time: 09/20/23  1939     History  Chief Complaint  Patient presents with   Wrist Pain    Allen Gould is a 58 y.o. male past medical history significant for obesity, CKD, diabetes, GERD, asthma presents today for right hand/wrist pain x 5 days.  Patient states he was trying to roll himself in a wheelchair up a hill and his wrist/hand got "torqued".  Patient endorses pain and swelling.  Patient denies numbness, tingling, fever, chills, any other complaints at this time.  HPI     Home Medications Prior to Admission medications   Medication Sig Start Date End Date Taking? Authorizing Provider  Accu-Chek Softclix Lancets lancets Check blood sugar 3 times a day 06/11/20   Barnetta Liberty, MD  amLODipine  (NORVASC ) 10 MG tablet Take 1 tablet (10 mg total) by mouth daily. 10/06/21 10/23/22  Melodye Spurr, MD  apixaban  (ELIQUIS ) 5 MG TABS tablet Take 1 tablet (5 mg total) by mouth 2 (two) times daily. 01/30/21   Jacqueline Matsu, MD  atorvastatin  (LIPITOR ) 80 MG tablet TAKE 1 TABLET (80 MG TOTAL) BY MOUTH DAILY. 07/01/21   Melodye Spurr, MD  diclofenac  Sodium (VOLTAREN ) 1 % GEL Apply 4 g topically 4 (four) times daily -  before meals and at bedtime. 01/30/22   Mapp, Tavien, MD  Dulaglutide  (TRULICITY ) 1.5 MG/0.5ML SOPN Inject 1.5 mg into the skin once a week.    [provider]  DULoxetine  (CYMBALTA ) 60 MG capsule Take 2 capsules (120 mg total) by mouth daily. 11/04/22   Veronia Goon, DO  Fluticasone Furoate  (ARNUITY ELLIPTA ) 100 MCG/ACT AEPB Inhale 1 puff into the lungs daily. 11/03/22   Veronia Goon, DO  furosemide  (LASIX ) 40 MG tablet Take 1 tablet (40 mg total) by mouth daily. 11/04/22   Veronia Goon, DO  gabapentin  (NEURONTIN ) 300 MG capsule TAKE 1 CAPSULE (300 MG TOTAL) BY MOUTH IN THE MORNING, AT NOON, AND AT BEDTIME. 09/03/21 10/23/22  [name], [name], MD  glucose blood  (ACCU-CHEK GUIDE) test strip Check blood sugar 3 times a day 06/11/20   Barnetta Liberty, MD  insulin  aspart (NOVOLOG ) 100 UNIT/ML injection Inject 10 Units into the skin 3 (three) times daily with meals. 11/03/22   Veronia Goon, DO  insulin  glargine-yfgn (SEMGLEE ) 100 UNIT/ML injection Inject 0.3 mLs (30 Units total) into the skin daily. 11/04/22   Veronia Goon, DO  lidocaine  (LIDODERM ) 5 % Place 1 patch onto the skin daily. Remove & Discard patch within 12 hours or as directed by MD 04/15/23   Barrett, Jamie N, PA-C  methocarbamol  (ROBAXIN ) 500 MG tablet Take 1 tablet (500 mg total) by mouth 2 (two) times daily. 04/15/23   Barrett, Jamie N, PA-C  metoprolol  succinate (TOPROL  XL) 25 MG 24 hr tablet Take 1 tablet (25 mg total) by mouth at bedtime. 10/06/21   Melodye Spurr, MD  naproxen  (NAPROSYN ) 500 MG tablet Take 1 tablet (500 mg total) by mouth 2 (two) times daily. 04/15/23   Barrett, Jamie N, PA-C  nystatin  (MYCOSTATIN /NYSTOP ) powder Apply 1 Application topically 3 (three) times daily. 10/23/22   Almond Army, MD  pantoprazole  (PROTONIX ) 40 MG tablet TAKE 1 TABLET (40 MG TOTAL) BY MOUTH DAILY. 09/03/21 10/23/22  Melodye Spurr, MD  valsartan  (DIOVAN ) 80 MG tablet Take 1 tablet (80 mg total) by mouth daily. 10/06/21   Melodye Spurr, MD  buPROPion  (WELLBUTRIN  XL) 150 MG 24 hr tablet  Take 1 tablet (150 mg total) by mouth every morning. 06/04/21 06/13/21  Melodye Spurr, MD      Allergies    Shellfish allergy    Review of Systems   Review of Systems  Musculoskeletal:  Positive for arthralgias.    Physical Exam Updated Vital Signs BP (!) 157/91 (BP Location: Left Arm)   Pulse 95   Temp 97.7 F (36.5 C) (Oral)   Resp 18   SpO2 97%  Physical Exam Vitals and nursing note reviewed.  Constitutional:      General: He is not in acute distress.    Appearance: He is well-developed. He is obese. He is not ill-appearing or toxic-appearing.  HENT:     Head: Normocephalic and atraumatic.   Eyes:     Extraocular Movements: Extraocular movements intact.     Conjunctiva/sclera: Conjunctivae normal.  Cardiovascular:     Rate and Rhythm: Normal rate and regular rhythm.     Pulses: Normal pulses.  Pulmonary:     Effort: Pulmonary effort is normal. No respiratory distress.     Breath sounds: Normal breath sounds.  Abdominal:     Palpations: Abdomen is soft.     Tenderness: There is no abdominal tenderness.  Musculoskeletal:        General: Swelling and tenderness present. No deformity.     Cervical back: Neck supple.     Comments: Patient with swelling to the proximal dorsum of his right hand and into his distal wrist.  Patient endorses snuffbox tenderness.  No deformity, ecchymosis, or erythema noted.  +2 radial pulses.  Range of motion limited due to pain.  Skin:    General: Skin is warm and dry.     Capillary Refill: Capillary refill takes less than 2 seconds.     Findings: No bruising or erythema.  Neurological:     General: No focal deficit present.     Mental Status: He is alert.  Psychiatric:        Mood and Affect: Mood normal.     ED Results / Procedures / Treatments   Labs (all labs ordered are listed, but only abnormal results are displayed) Labs Reviewed - No data to display  EKG None  Radiology DG Hand Complete Right Result Date: 09/20/2023 CLINICAL DATA:  Pain and swelling at base of thumb and wrist EXAM: RIGHT HAND - COMPLETE 3+ VIEW; RIGHT WRIST - COMPLETE 3+ VIEW COMPARISON:  10/10/2019 FINDINGS: Right wrist: Frontal, oblique, and lateral views are obtained. No acute fracture, subluxation, or dislocation. Mild atherosclerosis within the radial aspect of the carpus. Soft tissues are unremarkable. Right Hand: Frontal, oblique, and lateral views of the right hand are obtained. No acute fracture, subluxation, or dislocation. Mild joint space narrowing at the first metacarpophalangeal joint and distal interphalangeal joints. Soft tissues are unremarkable.  IMPRESSION: 1. Mild osteoarthritis, with joint space narrowing throughout the distal interphalangeal joints, first metacarpophalangeal joint, and radial aspect of the carpus. 2. No acute fracture. Electronically Signed   By: Bobbye Burrow M.D.   On: 09/20/2023 20:14   DG Wrist Complete Right Result Date: 09/20/2023 CLINICAL DATA:  Pain and swelling at base of thumb and wrist EXAM: RIGHT HAND - COMPLETE 3+ VIEW; RIGHT WRIST - COMPLETE 3+ VIEW COMPARISON:  10/10/2019 FINDINGS: Right wrist: Frontal, oblique, and lateral views are obtained. No acute fracture, subluxation, or dislocation. Mild atherosclerosis within the radial aspect of the carpus. Soft tissues are unremarkable. Right Hand: Frontal, oblique, and lateral views of the right hand  are obtained. No acute fracture, subluxation, or dislocation. Mild joint space narrowing at the first metacarpophalangeal joint and distal interphalangeal joints. Soft tissues are unremarkable. IMPRESSION: 1. Mild osteoarthritis, with joint space narrowing throughout the distal interphalangeal joints, first metacarpophalangeal joint, and radial aspect of the carpus. 2. No acute fracture. Electronically Signed   By: Bobbye Burrow M.D.   On: 09/20/2023 20:14    Procedures Procedures    Medications Ordered in ED Medications - No data to display  ED Course/ Medical Decision Making/ A&P                                 Medical Decision Making Amount and/or Complexity of Data Reviewed Radiology: ordered.   This patient presents to the ED for concern of right hand/wrist pain differential diagnosis includes musculoskeletal pain, scaphoid fracture, radial fracture, ulnar fracture  Imaging Studies ordered:  I ordered imaging studies including right hand and wrist x-rays I independently visualized and interpreted imaging which showed mild osteoarthritis, no acute fracture I agree with the radiologist interpretation  Problem List / ED Course:  Patient placed in  wrist brace for comfort Considered for admission or further workup however patient's vital signs, physical exam, and imaging are reassuring.  Patient's symptoms likely due to musculoskeletal pain.  Patient placed in wrist brace for comfort and advised to take Tylenol  as needed for pain and swelling.  Patient should follow-up with his primary care if her symptoms persist for further evaluation and workup.        Final Clinical Impression(s) / ED Diagnoses Final diagnoses:  Right wrist pain    Rx / DC Orders ED Discharge Orders     None         Merryl Abraham 09/20/23 2019    Mordecai Applebaum, MD 09/20/23 [id number]

## 2023-11-09 ENCOUNTER — Ambulatory Visit: Attending: Cardiology | Admitting: Cardiology

## 2023-11-09 VITALS — BP 130/93 | HR 79 | Ht 70.0 in | Wt 338.0 lb

## 2023-11-09 DIAGNOSIS — I119 Hypertensive heart disease without heart failure: Secondary | ICD-10-CM

## 2023-11-09 DIAGNOSIS — I452 Bifascicular block: Secondary | ICD-10-CM | POA: Diagnosis not present

## 2023-11-09 DIAGNOSIS — I2510 Atherosclerotic heart disease of native coronary artery without angina pectoris: Secondary | ICD-10-CM

## 2023-11-09 DIAGNOSIS — I480 Paroxysmal atrial fibrillation: Secondary | ICD-10-CM | POA: Diagnosis not present

## 2023-11-09 DIAGNOSIS — R079 Chest pain, unspecified: Secondary | ICD-10-CM

## 2023-11-09 DIAGNOSIS — I350 Nonrheumatic aortic (valve) stenosis: Secondary | ICD-10-CM

## 2023-11-09 DIAGNOSIS — E785 Hyperlipidemia, unspecified: Secondary | ICD-10-CM

## 2023-11-09 DIAGNOSIS — I10 Essential (primary) hypertension: Secondary | ICD-10-CM

## 2023-11-09 DIAGNOSIS — I77810 Thoracic aortic ectasia: Secondary | ICD-10-CM

## 2023-11-09 DIAGNOSIS — Z79899 Other long term (current) drug therapy: Secondary | ICD-10-CM

## 2023-11-09 DIAGNOSIS — G4733 Obstructive sleep apnea (adult) (pediatric): Secondary | ICD-10-CM

## 2023-11-09 NOTE — Addendum Note (Signed)
 Addended by: Cherylyn Cos on: 11/09/2023 03:34 PM   Modules accepted: Orders

## 2023-11-09 NOTE — Patient Instructions (Signed)
 Medication Instructions:  Your physician recommends that you continue on your current medications as directed. Please refer to the Current Medication list given to you today.  *If you need a refill on your cardiac medications before your next appointment, please call your pharmacy*  Lab Work: Please complete a FASTING lipid panel, CBC and CMET when you are able.   If you have labs (blood work) drawn today and your tests are completely normal, you will receive your results only by: MyChart Message (if you have MyChart) OR A paper copy in the mail If you have any lab test that is abnormal or we need to change your treatment, we will call you to review the results.  Testing/Procedures: Your physician has recommended that you have a split night sleep study. This test records several body functions during sleep, including: brain activity, eye movement, oxygen and carbon dioxide blood levels, heart rate and rhythm, breathing rate and rhythm, the flow of air through your mouth and nose, snoring, body muscle movements, and chest and belly movement.  Your physician has requested that you have an echocardiogram. Echocardiography is a painless test that uses sound waves to create images of your heart. It provides your doctor with information about the size and shape of your heart and how well your heart's chambers and valves are working. This procedure takes approximately one hour. There are no restrictions for this procedure. Please do NOT wear cologne, perfume, aftershave, or lotions (deodorant is allowed). Please arrive 15 minutes prior to your appointment time.  Please note: We ask at that you not bring children with you during ultrasound (echo/ vascular) testing. Due to room size and safety concerns, children are not allowed in the ultrasound rooms during exams. Our front office staff cannot provide observation of children in our lobby area while testing is being conducted. An adult accompanying a  patient to their appointment will only be allowed in the ultrasound room at the discretion of the ultrasound technician under special circumstances. We apologize for any inconvenience.  Your physician has requested that you have a lexiscan myoview. For further information please visit [email or web address] Please follow instruction sheet, as given.    Follow-Up: At [facility], you and your health needs are our priority.  As part of our continuing mission to provide you with exceptional heart care, our providers are all part of one team.  This team includes your primary Cardiologist (physician) and Advanced Practice Providers or APPs (Physician Assistants and Nurse Practitioners) who all work together to provide you with the care you need, when you need it.  Your next appointment:   1 year(s)  Provider:   Gaylyn Keas, MD    We recommend signing up for the patient portal called MyChart.  Sign up information is provided on this After Visit Summary.  MyChart is used to connect with patients for Virtual Visits (Telemedicine).  Patients are able to view lab/test results, encounter notes, upcoming appointments, etc.  Non-urgent messages can be sent to your provider as well.   To learn more about what you can do with MyChart, go to [email or web address].   Other Instructions Please make an appointment to see Dr. Paulita Boss to continue workup for HOCM.  Dr. Micael Adas would also like you to make an appointment with our device clinic.

## 2023-11-09 NOTE — Progress Notes (Signed)
 Cardiology Office Note:    Date:  11/09/2023   ID:  Allen Gould, DOB Dec 06, 1965, MRN [id number]  PCP:  No primary care provider on file.  Cardiologist:  Allen Keas, MD   Electrophysiologist:  Allen Byes, MD   Referring MD: Allen Roles, MD   Chief Complaint:  No chief complaint on file.    Expand All Collapse All Chief Complaint:  Follow-up for A-fib, hypertrophic cardiomyopathy, HFpEF     Patient Profile: Coronary artery disease Mild to mod non-obs Dz by cath at [facility] in 2018  CPET 1/23: Fxn limitation due to deconditioning, obesity, chrono incompetence Paroxysmal atrial fibrillation CHA2DS2-VASc=3 (HTN, Diab, CAD)  admx in 11/2018 at [facility] w scrotal abscess >> AF w RVR Prior anticoagulation with Apixaban , Rivaroxaban >> not continued after 4/21 admit due to hx of ETOH abuse, non-adherence  Anticoag later resumed with Apixaban  Hypertrophic CM  PYP 7/22: equivocal SPEP/UPEP normal CMR: not suggestive of Amyloid; c/w hypertropic CM (HFpEF) heart failure with preserved ejection fraction  RBBB, 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 There is also marked chronotropic incompetence in the setting of chronic AV pacing - consider adjustment of rate-response parameters if clinically indicated.    Exercise Echocardiogram 05/11/21 Pt could not walk on Treadmill Peak LVOT w Valsalva 16 mmHg   Cardiac MRI 02/03/21 EF 59 No cardiac amyloidosis C/w hypertrophic CM   Echocardiogram 09/29/20 EF 50-55, severe LVH, Gr 1 DD, normal RVSF,  trivial MR, mild MS (mean 3 mmHg), AV sclerosis w/o AS, Ao root 42 mm, ascending aorta 42 mm        History of Present Illness:    Allen Gould has not been seen by me since 2022.  He has not followed up in device clinic either for his pacemaker.He is here today for followup and is doing well.  He has a lot of indigestion but does notice that when he uses his arms his chest will feel tight but he thinks it is muscular because he has been having to use a wheelchair. He has chronic DOE if he overexerts himself with the wheelchair.  He has not been using his CPAP because he lost his device when he was staying at the [facility]. He dais that he needed another sleep study to get another device.  He wakes up at night gasping for breath and with HAs as well.  He denies any PND,orthopnea, LE edema,  palpitations or syncope. HE is compliant with his meds and is tolerating meds with no SE.    Past Medical History:  Diagnosis Date   Acquired dilation of ascending aorta and aortic root (HCC)    42mm by echo 09/2020 but normal on Chest CTA 05/2020   Acute kidney injury (HCC) 09/01/2019   Asthma    Balanitis 08/09/2014   Bifascicular block    Cough 08/09/2014   Diabetes mellitus without complication (HCC)    Dilated aortic root (HCC)    42mm on echo 09/2020   DM (diabetes mellitus) type 2, uncontrolled, without ketoacidosis 08/10/2013   Dyslipidemia 03/01/2014   Financial difficulties 08/09/2014  GERD (gastroesophageal reflux disease)    Health care maintenance 03/01/2014   HOCM (hypertrophic obstructive cardiomyopathy) (HCC)    noted on cMRI but no SAM or LVOT gradient   Hypertension    Hypomagnesemia 05/29/2017   Insomnia 08/29/2013   Mitral stenosis    mean MVG   Morbid obesity (HCC) 08/29/2013   Near syncope 05/29/2017   Odontogenic infection of jaw 05/29/2017   PAC (premature atrial contraction)    PAF (paroxysmal atrial fibrillation) (HCC)    Pressure injury of skin 09/02/2019    PVCs (premature ventricular contractions)    Right shoulder pain 08/29/2013   Second degree AV block, Mobitz type I    Sepsis (HCC) 05/29/2017   Sleep apnea    uses cpap    Current Medications: Current Meds  Medication Sig   Accu-Chek Softclix Lancets lancets Check blood sugar 3 times a day   acetaminophen  (TYLENOL ) 500 MG tablet Take 1,000 mg by mouth every 6 (six) hours as needed for moderate pain (pain score 4-6) (2 tabs prn).   amLODipine  (NORVASC ) 10 MG tablet Take 1 tablet (10 mg total) by mouth daily.   apixaban  (ELIQUIS ) 5 MG TABS tablet Take 1 tablet (5 mg total) by mouth 2 (two) times daily.   atorvastatin  (LIPITOR ) 80 MG tablet TAKE 1 TABLET (80 MG TOTAL) BY MOUTH DAILY.   diclofenac  Sodium (VOLTAREN ) 1 % GEL Apply 4 g topically 4 (four) times daily -  before meals and at bedtime.   Dulaglutide  (TRULICITY ) 1.5 MG/0.5ML SOPN Inject 1.5 mg into the skin once a week.   DULoxetine  (CYMBALTA ) 60 MG capsule Take 2 capsules (120 mg total) by mouth daily.   Fluticasone Furoate  (ARNUITY ELLIPTA ) 100 MCG/ACT AEPB Inhale 1 puff into the lungs daily.   furosemide  (LASIX ) 40 MG tablet Take 1 tablet (40 mg total) by mouth daily.   gabapentin  (NEURONTIN ) 300 MG capsule TAKE 1 CAPSULE (300 MG TOTAL) BY MOUTH IN THE MORNING, AT NOON, AND AT BEDTIME.   glucose blood (ACCU-CHEK GUIDE) test strip Check blood sugar 3 times a day   insulin  aspart (NOVOLOG ) 100 UNIT/ML injection Inject 10 Units into the skin 3 (three) times daily with meals.   insulin  glargine-yfgn (SEMGLEE ) 100 UNIT/ML injection Inject 0.3 mLs (30 Units total) into the skin daily.   lidocaine  (LIDODERM ) 5 % Place 1 patch onto the skin daily. Remove & Discard patch within 12 hours or as directed by MD   methocarbamol  (ROBAXIN ) 500 MG tablet Take 1 tablet (500 mg total) by mouth 2 (two) times daily.   metoprolol  succinate (TOPROL  XL) 25 MG 24 hr tablet Take 1 tablet (25 mg total) by mouth at bedtime.   naproxen  (NAPROSYN ) 500 MG  tablet Take 1 tablet (500 mg total) by mouth 2 (two) times daily.   nystatin  (MYCOSTATIN /NYSTOP ) powder Apply 1 Application topically 3 (three) times daily.   pantoprazole  (PROTONIX ) 40 MG tablet TAKE 1 TABLET (40 MG TOTAL) BY MOUTH DAILY.     Allergies:   Shellfish allergy   Social History   Tobacco Use   Smoking status: Former    Current packs/day: 0.00    Types: Cigarettes    Quit date: 04/24/2005    Years since quitting: 18.5   Smokeless tobacco: Never  Vaping Use   Vaping status: Never Used  Substance Use Topics   Alcohol use: Not Currently    Alcohol/week: 2.0 standard drinks of alcohol    Types: 2 Cans of beer per week  Comment: Last drink 8 months ago   Drug use: No     Family Hx: The patient's family history includes Diabetes in his sister; Heart disease in an other family member; Hyperlipidemia in his sister; Kidney disease in his mother.  ROS   EKGs/Labs/Other Test Reviewed:    Recent Labs: 01/30/2023: ALT 34 02/19/2023: BUN 26; Creatinine, Ser 1.31; Hemoglobin 12.9; Platelets 254; Potassium 4.2; Sodium 136   Recent Lipid Panel Lab Results  Component Value Date/Time   CHOL 92 (L) 04/08/2020 08:17 AM   TRIG 87 04/08/2020 08:17 AM   HDL 40 04/08/2020 08:17 AM   CHOLHDL 2.3 04/08/2020 08:17 AM   CHOLHDL 6.0 02/28/2014 03:08 PM   LDLCALC 35 04/08/2020 08:17 AM   LDLDIRECT 108 (H) 03/01/2014 04:09 AM    Physical Exam:    VS:  BP (!) 130/93 (BP Location: Left Arm)   Pulse 79   Ht 5' 10 (1.778 m)   Wt (!) 338 lb (153.3 kg)   SpO2 95%   BMI 48.50 kg/m     Wt Readings from Last 3 Encounters:  11/09/23 (!) 338 lb (153.3 kg)  04/15/23 (!) 324 lb 1.2 oz (147 kg)  02/19/23 (!) 325 lb (147.4 kg)    GEN: Well nourished, well developed in no acute distress HEENT: Normal NECK: No JVD; No carotid bruits LYMPHATICS: No lymphadenopathy CARDIAC:RRR, no murmurs, rubs, gallops RESPIRATORY:  Clear to auscultation without rales, wheezing or rhonchi  ABDOMEN:  Soft, non-tender, non-distended MUSCULOSKELETAL:  No edema; No deformity  SKIN: Warm and dry NEUROLOGIC:  Alert and oriented x 3 PSYCHIATRIC:  Normal affect   ASSESSMENT & PLAN:    1. Bifascicular block/Second degree AV block, Mobitz type I -s/p PPM by Dr. Marven Slimmer 06/24/2020 -followed in device clinic but has not been seen since 2022 - Will get appointment for device clinic denies any  2. Coronary artery disease involving native coronary artery of native heart without angina pectoris -he has a hx of nonobstructive disease by cardiac catheterization in 2018.   -he has been having SOB and some chest tightness  but thinks it is muscular from using a wheelchair>>I have recommended that he get a Lexiscan myoview to rule out ischemia -CP XT done 06/12/2021 for shortness of breath showed moderate to severe functional limitation due to primarily Lee obesity and deconditioning with chronotropic incompetence in the setting of chronic AV pacing. -No ASA due to DOAC  -continue atorvastatin  80 mg daily and Toprol -XL 25 mg daily  3. PAF (paroxysmal atrial fibrillation) (HCC) -Maintaining normal sinus rhythm on exam today -Denies any palpitations or palpitations -Continue apixaban  5 mg twice daily and Toprol  XL 25 mg daily with as needed refills -Check CBC and BMET  4. Essential hypertension - BP controlled on exam today - Continue amlodipine  10 mg daily, Toprol  XL 25 mg daily, valsartan  80 mg daily, Lasix  40 mg daily with as needed refills  5.  HLD -LDL goal < 70 -Check FLP and ALT -Continue atorvastatin  80 mg daily with as needed refills  6. Phenotypic hypertrophy -PYP study equivocal and SPEP/UPEP normal -Seen by HOCM clinic  -Felt this is related to long standing hypertension -He was supposed to get a stress echo to assess for LVOT gradient and SAM but this never occurred so I will reschedule this -If evidence of HOCM then his son should be evaluated -If stress echo shows no evidence of  LVOT gradient with exercise then recommend optimization of medical therapy for hypertensive heart disease  6.  OSA -he lost his CPAP and says that he was told he needs a new sleep study to get a new device -I will order a split night sleep study  7.  Aortic stenosis -none by echo 09/2020   8.  Mildly dilated aortic root/ascending aorta -42mm by echo 09/2020 -repeat 2D echo   -repeat Dispo: 1 year  Medication Adjustments/Labs and Tests Ordered: Current medicines are reviewed at length with the patient today.  Concerns regarding medicines are outlined above.  Tests Ordered: No orders of the defined types were placed in this encounter.   Medication Changes: No orders of the defined types were placed in this encounter.    Signed, Allen Keas, MD  11/09/2023 2:51 PM    [facility] [address] [place], [place], [place]  [address] Phone: [phone]; Fax: [phone]

## 2023-11-15 LAB — LIPID PANEL

## 2023-11-16 ENCOUNTER — Ambulatory Visit: Payer: Self-pay | Admitting: Cardiology

## 2023-11-16 LAB — COMPREHENSIVE METABOLIC PANEL WITH GFR
ALT: 23 IU/L (ref 0–44)
AST: 17 IU/L (ref 0–40)
Albumin: 4.4 g/dL (ref 3.8–4.9)
Alkaline Phosphatase: 117 IU/L (ref 44–121)
BUN/Creatinine Ratio: 21 — ABNORMAL HIGH (ref 9–20)
BUN: 30 mg/dL — ABNORMAL HIGH (ref 6–24)
Bilirubin Total: <0.2 mg/dL (ref 0.0–1.2)
CO2: 21 mmol/L (ref 20–29)
Calcium: 10.1 mg/dL (ref 8.7–10.2)
Chloride: 98 mmol/L (ref 96–106)
Creatinine, Ser: 1.41 mg/dL — ABNORMAL HIGH (ref 0.76–1.27)
Globulin, Total: 3.1 g/dL (ref 1.5–4.5)
Glucose: 223 mg/dL — ABNORMAL HIGH (ref 70–99)
Potassium: 5.3 mmol/L — ABNORMAL HIGH (ref 3.5–5.2)
Sodium: 136 mmol/L (ref 134–144)
Total Protein: 7.5 g/dL (ref 6.0–8.5)
eGFR: 57 mL/min/{1.73_m2} — ABNORMAL LOW (ref >59)

## 2023-11-16 LAB — LIPID PANEL
Cholesterol, Total: 102 mg/dL (ref 100–199)
HDL: 28 mg/dL — ABNORMAL LOW (ref >39)
LDL CALC COMMENT:: 3.6 ratio (ref 0.0–5.0)
LDL Chol Calc (NIH): 42 mg/dL (ref 0–99)
Triglycerides: 196 mg/dL — ABNORMAL HIGH (ref 0–149)
VLDL Cholesterol Cal: 32 mg/dL (ref 5–40)

## 2023-11-16 LAB — CBC
Hematocrit: 42.9 % (ref 37.5–51.0)
Hemoglobin: 14 g/dL (ref 13.0–17.7)
MCH: 32 pg (ref 26.6–33.0)
MCHC: 32.6 g/dL (ref 31.5–35.7)
MCV: 98 fL — ABNORMAL HIGH (ref 79–97)
Platelets: 233 10*3/uL (ref 150–450)
RBC: 4.37 x10E6/uL (ref 4.14–5.80)
RDW: 12.7 % (ref 11.6–15.4)
WBC: 11 10*3/uL — ABNORMAL HIGH (ref 3.4–10.8)

## 2023-12-01 NOTE — Telephone Encounter (Signed)
-----   Message from [name] sent at 11/16/2023  8:11 AM EDT ----- Please let patient know that white blood cell count was slightly elevated although lower than what it was a year ago, triglycerides were elevated, renal function stable and potassium slightly  elevated at 5.3.  Please encourage her to avoid any added potassium supplements in her diet or multivitamins with potassium in them.  Please forward labs to PCP for review given elevated white cell  count.  Please find out if patient was fasting for these labs ----- Message ----- From: Interface, [facility] Lab Results In Sent: 11/15/2023  11:41 PM EDT To: [name], MD

## 2023-12-01 NOTE — Telephone Encounter (Signed)
 Call to # listed on DPR,  no answer, unable to leave VM.

## 2023-12-08 ENCOUNTER — Telehealth: Payer: Self-pay

## 2023-12-08 NOTE — Telephone Encounter (Signed)
Monitor ordered. He should receive it in 7-10 business days.

## 2023-12-08 NOTE — Telephone Encounter (Signed)
-----   Message from Nurse Almarie ORN sent at 11/09/2023  4:05 PM EDT ----- Regarding: FW: Re-establish with device clinic  ----- Message ----- From: [name], RN Sent: 11/09/2023   3:46 PM EDT To: [facility] Subject: Re-establish with device clinic                Hi, I just got a little more information about this patient- he says he lost all his equipment when he moved into his ALF. Just didn't want ya'll to be driving yourself nuts! Geni, RN

## 2023-12-15 NOTE — Telephone Encounter (Signed)
 TC, Unable to leave VM. Letter sent.

## 2023-12-20 ENCOUNTER — Telehealth (HOSPITAL_COMMUNITY): Payer: Self-pay | Admitting: *Deleted

## 2023-12-20 ENCOUNTER — Encounter (HOSPITAL_COMMUNITY): Payer: Self-pay | Admitting: *Deleted

## 2023-12-20 NOTE — Telephone Encounter (Signed)
 Instructions for upcoming stress test sent via [employer].  Argentina Bees, RN

## 2023-12-21 ENCOUNTER — Telehealth (HOSPITAL_COMMUNITY): Payer: Self-pay | Admitting: Radiology

## 2023-12-21 NOTE — Telephone Encounter (Signed)
 Patient given detailed instructions per Myocardial Perfusion Study Information Sheet for the test on 8/4 at 10:30. Patient notified to arrive 15 minutes early and that it is imperative to arrive on time for appointment to keep from having the test rescheduled.  If you need to cancel or reschedule your appointment, please call the office within 24 hours of your appointment. . Patient verbalized understanding.EHK

## 2023-12-22 ENCOUNTER — Encounter: Payer: Self-pay | Admitting: Cardiology

## 2023-12-22 ENCOUNTER — Ambulatory Visit (HOSPITAL_COMMUNITY)
Admission: RE | Admit: 2023-12-22 | Discharge: 2023-12-22 | Disposition: A | Discharge status: Home / Self Care | Source: Ambulatory Visit | Attending: Cardiology | Admitting: Cardiology

## 2023-12-22 DIAGNOSIS — G4730 Sleep apnea, unspecified: Secondary | ICD-10-CM | POA: Diagnosis not present

## 2023-12-22 DIAGNOSIS — E1122 Type 2 diabetes mellitus with diabetic chronic kidney disease: Secondary | ICD-10-CM | POA: Diagnosis not present

## 2023-12-22 DIAGNOSIS — I2510 Atherosclerotic heart disease of native coronary artery without angina pectoris: Secondary | ICD-10-CM | POA: Diagnosis not present

## 2023-12-22 DIAGNOSIS — I130 Hypertensive heart and chronic kidney disease with heart failure and stage 1 through stage 4 chronic kidney disease, or unspecified chronic kidney disease: Secondary | ICD-10-CM | POA: Insufficient documentation

## 2023-12-22 DIAGNOSIS — R0602 Shortness of breath: Secondary | ICD-10-CM | POA: Diagnosis not present

## 2023-12-22 DIAGNOSIS — I088 Other rheumatic multiple valve diseases: Secondary | ICD-10-CM

## 2023-12-22 DIAGNOSIS — J45909 Unspecified asthma, uncomplicated: Secondary | ICD-10-CM | POA: Insufficient documentation

## 2023-12-22 DIAGNOSIS — N189 Chronic kidney disease, unspecified: Secondary | ICD-10-CM | POA: Diagnosis not present

## 2023-12-22 DIAGNOSIS — I77810 Thoracic aortic ectasia: Secondary | ICD-10-CM | POA: Diagnosis not present

## 2023-12-22 DIAGNOSIS — I422 Other hypertrophic cardiomyopathy: Secondary | ICD-10-CM | POA: Diagnosis not present

## 2023-12-22 DIAGNOSIS — I509 Heart failure, unspecified: Secondary | ICD-10-CM | POA: Diagnosis not present

## 2023-12-22 DIAGNOSIS — I4510 Unspecified right bundle-branch block: Secondary | ICD-10-CM | POA: Insufficient documentation

## 2023-12-22 DIAGNOSIS — Z95810 Presence of automatic (implantable) cardiac defibrillator: Secondary | ICD-10-CM | POA: Diagnosis not present

## 2023-12-22 DIAGNOSIS — I4891 Unspecified atrial fibrillation: Secondary | ICD-10-CM | POA: Diagnosis not present

## 2023-12-22 DIAGNOSIS — I495 Sick sinus syndrome: Secondary | ICD-10-CM | POA: Insufficient documentation

## 2023-12-22 LAB — ECHOCARDIOGRAM COMPLETE
Area-P 1/2: 3.51 cm2
S' Lateral: 2.94 cm

## 2023-12-22 MED ORDER — PERFLUTREN LIPID MICROSPHERE
1.0000 mL | INTRAVENOUS | Status: AC | PRN
  Administered 2023-12-22: 3 mL via INTRAVENOUS

## 2023-12-22 NOTE — Telephone Encounter (Signed)
 Pt returning call

## 2023-12-23 ENCOUNTER — Other Ambulatory Visit: Payer: Self-pay | Admitting: Cardiology

## 2023-12-23 DIAGNOSIS — R079 Chest pain, unspecified: Secondary | ICD-10-CM

## 2023-12-26 ENCOUNTER — Ambulatory Visit (HOSPITAL_COMMUNITY)
Admission: RE | Admit: 2023-12-26 | Discharge: 2023-12-26 | Disposition: A | Discharge status: Home / Self Care | Source: Ambulatory Visit | Attending: Cardiology | Admitting: Cardiology

## 2023-12-26 DIAGNOSIS — R079 Chest pain, unspecified: Secondary | ICD-10-CM | POA: Diagnosis present

## 2023-12-26 MED ORDER — TECHNETIUM TC 99M TETROFOSMIN IV KIT
37.1000 | PACK | Freq: Once | INTRAVENOUS | Status: AC | PRN
  Administered 2023-12-26: 37.1 via INTRAVENOUS

## 2023-12-26 MED ORDER — REGADENOSON 0.4 MG/5ML IV SOLN
0.4000 mg | Freq: Once | INTRAVENOUS | Status: AC
  Administered 2023-12-26: 0.4 mg via INTRAVENOUS

## 2023-12-26 MED ORDER — REGADENOSON 0.4 MG/5ML IV SOLN
INTRAVENOUS | Status: AC
Start: 2023-12-26 — End: 2023-12-26
  Filled 2023-12-26: qty 5

## 2023-12-26 MED ORDER — TECHNETIUM TC 99M TETROFOSMIN IV KIT
13.7000 | PACK | Freq: Once | INTRAVENOUS | Status: AC | PRN
  Administered 2023-12-26: 13.7 via INTRAVENOUS

## 2023-12-27 ENCOUNTER — Ambulatory Visit (HOSPITAL_COMMUNITY)

## 2023-12-27 LAB — MYOCARDIAL PERFUSION IMAGING
LV dias vol: 126 mL (ref 62–150)
LV sys vol: 50 mL (ref 4.2–5.8)
Nuc Stress EF: 60 %
Peak HR: 84 {beats}/min
Rest HR: 78 {beats}/min
Rest Nuclear Isotope Dose: 13.7 mCi
SDS: 1
SRS: 3
SSS: 4
ST Depression (mm): 0 mm
Stress Nuclear Isotope Dose: 37.1 mCi
TID: 1.05

## 2023-12-29 ENCOUNTER — Ambulatory Visit: Payer: Self-pay | Admitting: Cardiology

## 2023-12-30 NOTE — Telephone Encounter (Signed)
-----   Message from [name] sent at 12/22/2023  4:35 PM EDT ----- Echo showed normal pumping function and actually hyperdynamic with EF 70 to 75% with no valvular heart disease.  The aorta is mildly enlarged with a ascending aorta measurement 42 mm but when indexed  for body surface area this is normal for patient's body size ----- Message ----- From: Interface, [employer] Sent: 12/22/2023   4:14 PM EDT To: [name], MD

## 2023-12-30 NOTE — Telephone Encounter (Signed)
 Call to patient to advise that Echo showed normal pumping function and actually hyperdynamic with EF 70 to 75% with no valvular heart disease. Patient also verbalizes understanding that aorta is mildly enlarged with a ascending aorta measurement 42 mm but when indexed  for body surface area this is normal for patient's body size.   Also discussed that stress test is abnormal and he needs to come in to get set up for cardiac cath. I would like her to see an extender back to set up for left heart catheter possible PCI, patient states someone already call him and notified him, his appt w/ Hao Meng is set up for 01/04/24.

## 2024-01-02 ENCOUNTER — Telehealth: Payer: Self-pay | Admitting: Cardiology

## 2024-01-02 NOTE — Telephone Encounter (Signed)
   Pre-operative Risk Assessment    Patient Name: Allen Gould  DOB: 1965-12-27 MRN: [id number]   Date of last office visit: 11/09/23  Date of next office visit: 01/04/24   Request for Surgical Clearance    Procedure:  Dental Extraction - Amount of Teeth to be Pulled:  1   Date of Surgery:  Clearance 01/06/24                       Surgeon:  Dr. Veleria  Surgeon's Group or Practice Name:  [name]  Phone number:  [phone]  Fax number: [phone]    Type of Clearance Requested:   - Medical  - Pharmacy:  Hold Apixaban  (Eliquis )     Type of Anesthesia:  Local    Additional requests/questions:    Bonney Sheffield JONELLE Lenora   01/02/2024, 3:50 PM

## 2024-01-03 NOTE — Telephone Encounter (Signed)
   Name: Allen Gould  DOB: Aug 24, 1965  MRN: [id number]  Primary Cardiologist: [name], MD  Chart reviewed as part of pre-operative protocol coverage. Simple dental extractions are considered low risk procedures per guidelines and generally do not require any specific cardiac clearance. However the patient has an upcoming visit scheduled with Hao Meng, PA on 01/04/24 to discuss cardiac catheterization at which time clearance can be addressed in case there are any issues that would impact surgical recommendations.   SBE prophylaxis is not required for the patient.  Dental extraction not scheduled until 01/06/24 as below. I added preop FYI to appointment note so that provider is aware to address at time of outpatient visit.  Per office protocol the cardiology provider should forward their finalized clearance decision and recommendations regarding antiplatelet therapy to the requesting party below.    I will route this message as FYI to requesting party and remove this message from the preop box as separate preop APP input not needed at this time.   Please call with any questions.  Kallyn Demarcus D Oveta Idris, NP  01/03/2024, 7:40 AM

## 2024-01-04 ENCOUNTER — Encounter: Payer: Self-pay | Admitting: Physician Assistant

## 2024-01-04 ENCOUNTER — Ambulatory Visit: Attending: Physician Assistant | Admitting: Physician Assistant

## 2024-01-04 ENCOUNTER — Other Ambulatory Visit: Payer: Self-pay | Admitting: Physician Assistant

## 2024-01-04 VITALS — BP 116/72 | HR 81 | Ht 70.0 in | Wt 345.2 lb

## 2024-01-04 DIAGNOSIS — I2510 Atherosclerotic heart disease of native coronary artery without angina pectoris: Secondary | ICD-10-CM | POA: Diagnosis not present

## 2024-01-04 DIAGNOSIS — E785 Hyperlipidemia, unspecified: Secondary | ICD-10-CM

## 2024-01-04 DIAGNOSIS — I119 Hypertensive heart disease without heart failure: Secondary | ICD-10-CM

## 2024-01-04 DIAGNOSIS — I452 Bifascicular block: Secondary | ICD-10-CM

## 2024-01-04 DIAGNOSIS — I480 Paroxysmal atrial fibrillation: Secondary | ICD-10-CM

## 2024-01-04 DIAGNOSIS — G4733 Obstructive sleep apnea (adult) (pediatric): Secondary | ICD-10-CM

## 2024-01-04 DIAGNOSIS — I10 Essential (primary) hypertension: Secondary | ICD-10-CM

## 2024-01-04 DIAGNOSIS — I441 Atrioventricular block, second degree: Secondary | ICD-10-CM | POA: Diagnosis not present

## 2024-01-04 DIAGNOSIS — I350 Nonrheumatic aortic (valve) stenosis: Secondary | ICD-10-CM

## 2024-01-04 DIAGNOSIS — I77810 Thoracic aortic ectasia: Secondary | ICD-10-CM

## 2024-01-04 NOTE — Patient Instructions (Addendum)
 Medication Instructions:  No changes *If you need a refill on your cardiac medications before your next appointment, please call your pharmacy*  Lab Work: Today we are going to need a CBC and BMP If you have labs (blood work) drawn today and your tests are completely normal, you will receive your results only by: MyChart Message (if you have MyChart) OR A paper copy in the mail If you have any lab test that is abnormal or we need to change your treatment, we will call you to review the results.  Testing/Procedures: Next page  Follow-Up: At [facility], you and your health needs are our priority.  As part of our continuing mission to provide you with exceptional heart care, our providers are all part of one team.  This team includes your primary Cardiologist (physician) and Advanced Practice Providers or APPs (Physician Assistants and Nurse Practitioners) who all work together to provide you with the care you need, when you need it.  Your next appointment:   3-4 week(s)  Provider:   Any APP        Cardiac/Peripheral Catheterization   You are scheduled for a Cardiac Catheterization on Wednesday, August 20 with Dr. Gordy Bergamo.  1. Please arrive at the [facility] (Main Entrance A) at [facility]: [address] [place], [place] [address] at 8:00 AM (This time is TWO hour(s) before your procedure to ensure your preparation).   Free valet parking service is available. You will check in at ADMITTING. The support person will be asked to wait in the waiting room.  It is OK to have someone drop you off and come back when you are ready to be discharged.        Special note: Every effort is made to have your procedure done on time. Please understand that emergencies sometimes delay scheduled procedures.  2. Diet: No solid foods after midnight. You may have clear liquids until you arrive at the hospital.  List of approved liquids water, clear juice, clear tea, black  coffee, fruit juices, non-citric and without pulp, carbonated beverages, Gatorade, Kool -Aid, plain Jello-O and plain ice popsicles.  3. Hydration: You need to be well hydrated before your procedure time. You may drink approved liquids (see below) until you arrive at the hospital. On the way to the hospital, please drink a 16-oz (1 plastic bottle) of water.   List of approved liquids water, clear juice, clear tea, black coffee, fruit juices, non-citric and without pulp, carbonated beverages, Gatorade, Kool -Aid, plain Jello-O and plain ice popsicles.  4. Labs: You will need to have blood drawn today.   5. Medication instructions in preparation for your procedure:   Contrast Allergy: No  NO ELIQUIS  AFTER SUNDAY AUG 17 EVENING DOSE.  MAY RESUME AFTER CATH NO FUROSEMIDE  (LASIX ) ON DAY OF PROCEDURE  Take only 20 units of insulin  the night before your procedure. Do not take any insulin  on the day of the procedure.   On the morning of your procedure, take Aspirin  81 mg and any morning medicines NOT listed above.  You may use sips of water.  6. Plan to go home the same day, you will only stay overnight if medically necessary. 7. You MUST have a responsible adult to drive you home. 8. An adult MUST be with you the first 24 hours after you arrive home. 9. Bring a current list of your medications, and the last time and date medication taken. 10. Bring ID and current insurance  cards. 11.Please wear clothes that are easy to get on and off and wear slip-on shoes.  Thank you for allowing us  to care for you!   -- [facility]

## 2024-01-04 NOTE — Progress Notes (Signed)
 Cardiology Office Note:  .   Date:  01/04/2024  ID:  Allen Gould, DOB June 15, 1965, MRN [id number] PCP: Leontine Phebe Charlotte ONEIDA, NP  [facility] Providers Cardiologist:  [name], MD Electrophysiologist:  OLE [name], MD {  History of Present Illness: .   Allen Gould is a 58 y.o. male  with PMHx of  Coronary artery disease Mild to mod non-obs Dz by cath at [facility] in 2018  CPET 1/23: Fxn limitation due to deconditioning, obesity, chrono incompetence Paroxysmal atrial fibrillation CHA2DS2-VASc=3 (HTN, Diab, CAD)  admx in 11/2018 at [facility] w scrotal abscess >> AF w RVR Prior anticoagulation with Apixaban , Rivaroxaban >> not continued after 4/21 admit due to hx of ETOH abuse, non-adherence  Anticoag later resumed with Apixaban  Hypertrophic CM  PYP 7/22: equivocal SPEP/UPEP normal CMR: not suggestive of Amyloid; c/w hypertropic CM (HFpEF) heart failure with preserved ejection fraction  RBBB, 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   Last seen in heartcare OV 11/09/2023 with Dr. Bihari for overdue follow up since 2022.  Reported indigestion, SOB & tightness in arms/chest that he suspect is muscular due to wheelchair use, not compliant with CPAP due to losing device, waking up at night gasping for breath and HA. Patient requested another sleep study to get another CPAP device.  Denied any PND, orthopnea, LE edema, palpitation or syncope.  Continued on Lipitor  80 mg daily, Toprol  XL 25 mg daily, Eliquis  5 mg twice daily, amlodipine  10 mg daily, valsartan  40 mg daily, Lasix  40 mg daily. Follow up labs  showed CBC (elevated WBC 11), FLP (LDL 42, Trigly 196, HDL 28), BMP (Cr 1.41, K 5.3, LFT WNL).  Encouraged to avoid any added potassium supplements in her diet or multivitamins and recommended to follow-up with PCP about elevated WBC. Ordered Lexiscan .   Follow up ECHO 11/2023 showed EF 70 to 75%, hyperdynamic LV function, mildly dilated ascending aorta measuring 42 mm.  Noted aorta is is normal for patient's body size.   Follow-up Lexiscan  in 12/2023 showed intermediate risk with mild to moderate perfusion defect in anterior wall and worse at the apex.  Overall normal EF of 55 to 65% with septal dyskinesis noted, likely due to RV pacing.  Noted coronary calcification present in LAD and left Cx.  Recommended discussing possible cardiac cath.   Today, reports chronic unchanged SOB x several years associated with minimal exertion/transferring/at rest. Also reports chronic unchanged dizziness with changing positions and transferring. Also reports chronic unchanged palpitations when under stress that last 1-2 mins, described as pounding since 2022. Also noted chronic orthopnea and edema (improved with leg elevation). Reports compliance with medications. He lives in [place] [facility]. He mainly use wheelchair. He is able to care for himself. He is able to clean room and transfer himself.   ROS: 10 point review of system has been reviewed and considered negative except ones been listed in the HPI.   Studies Reviewed: SABRA   EKG Interpretation Date/Time:  Wednesday January 04 2024 15:23:39 EDT Ventricular Rate:  81 PR Interval:  218 QRS Duration:  162 QT Interval:  408 QTC Calculation: 473 R Axis:   -  83  Text Interpretation: Atrial-sensed ventricular-paced rhythm with prolonged AV conduction When compared with ECG of 19-Feb-2023 17:34, Vent. rate has increased BY  21 BPM Confirmed by Sheron Hallmark ([id number]) on 01/04/2024 3:27:25 PM   Cardiac MRI 01/2021 IMPRESSION: Normal Native T1  signal, ability to null the myocardium, normal atrial size and LGE pattern are not consistent with cardiac amyloidosis.   In lieu of systemic disease that would cause hypertrophy, study consistent with hypertrophic cardiomyopathy.  ECHO 11/2023 IMPRESSIONS   1. Difficult to comment on wall motion given poor quality images, but  appears hyperdynamic. Mild intercavitary gradient of with valsalva.  Left ventricular ejection fraction, by estimation, is 70 to 75%. The left  ventricle has hyperdynamic  function. Left ventricular endocardial border not optimally defined to  evaluate regional wall motion. Left ventricular diastolic parameters are  indeterminate.   2. Right ventricular systolic function was not well visualized. The right  ventricular size is not well visualized.   3. The mitral valve is normal in structure. No evidence of mitral valve  regurgitation. No evidence of mitral stenosis.   4. The aortic valve is normal in structure. Aortic valve regurgitation is  not visualized. No aortic stenosis is present.   5. Aortic dilatation noted. There is mild dilatation of the ascending  aorta, measuring 42 mm.   6. The inferior vena cava is normal in size with greater than 50%  respiratory variability, suggesting right atrial pressure of 3 mmHg.    Lexiscan  12/2023   Findings are consistent with ischemia. The study is intermediate risk.   No ST deviation was noted.   LV perfusion is abnormal. There is evidence of ischemia. Defect 1: There is a medium defect with moderate reduction in uptake present in the apical to basal anterior location(s) that is reversible. There is normal wall motion in the defect area. Consistent with ischemia.   Left ventricular function is normal. Nuclear stress EF: 60%. The left ventricular ejection fraction is normal (55-65%). End diastolic cavity size is mildly enlarged. End systolic cavity size is normal.   CT images were obtained for attenuation  correction and were examined for the presence of coronary calcium  when appropriate.   Coronary calcium  was present on the attenuation correction CT images. Coronary calcifications were present in the left anterior descending artery and left circumflex artery distribution(s).   Prior study not available for comparison.   Mild to moderate perfusion defect seen in stress images in anterior wall, worst at apex. EF overall normal with septal dyskinesis noted, like due to RV pacing. Risk Assessment/Calculations:    CHA2DS2-VASc Score = 4  This indicates a 4.8% annual risk of stroke. The patient's score is based upon: CHF History: 1 HTN History: 1 Diabetes History: 1 Stroke History: 0 Vascular Disease History: 1 Age Score: 0 Gender Score: 0  Physical Exam:   VS:  BP 116/72   Pulse 81   Ht 5' 10 (1.778 m)   Wt (!) 345 lb 3.2 oz (156.6 kg)   SpO2 95%   BMI 49.53 kg/m    Wt Readings from Last 3 Encounters:  01/04/24 (!) 345 lb 3.2 oz (156.6 kg)  11/09/23 (!) 338 lb (153.3 kg)  04/15/23 (!) 324 lb 1.2 oz (147 kg)    GEN: Well nourished, well developed in no acute distress while sitting in chair.  NECK: No JVD; No carotid bruits CARDIAC: RRR, no murmurs, rubs, gallops RESPIRATORY:  Clear to auscultation without rales, wheezing or rhonchi  ABDOMEN:  Soft, non-tender, non-distended EXTREMITIES:  No edema; No deformity   ASSESSMENT AND PLAN: .   Coronary artery disease involving native coronary artery of native heart without angina pectoris Hyperlipidemia LDL goal <70 SOB  Nonobstructive disease by cardiac catheterization in 2018.  CP XT done 06/12/2021 for shortness of breath showed moderate to severe functional limitation due to primarily obesity and deconditioning with chronotropic incompetence in the setting of chronic AV pacing.  ECHO 11/2023 showed EF 70 to 75%, hyperdynamic LV function Lexiscan  in 12/2023 showed intermediate risk with mild to moderate perfusion defect in anterior  wall and worse at the apex.  Overall normal EF of 55 to 65% with septal dyskinesis noted, likely due to RV pacing.  Noted coronary calcification present in LAD and left Cx.  Recommended discussing possible cardiac cath.  Denies any angina.  Reports chronic unchanged SOB x several years associated with minimal exertion/transferring/at rest.  Discuss outpatient cardiac cath. Reviewed risk and benefits. Patient understand and agrees to proceed. Order LHC. Patient has no preference on surgeon and would prefer 9 am arrival time for procedure. Hold Eliquis  2 day prior to procedure. Only take 1/2 dose of insulin  and no insulin  on the day of procedure.  EKG today: Ventricular paced with no ischemic changes.  Order BMP and CBC.  Not on ASA due to DOAC  Continue on Lipitor  80 mg daily and Toprol -XL 25 mg daily.  Preoperative Cardiovascular Risk Assessment Procedure:  Dental Extraction - Amount of Teeth to be Pulled:  1  Date of Surgery:  Clearance 01/06/24                     Surgeon:  Dr. Veleria  Surgeon's Group or Practice Name:  [facility]  Phone number:  [phone]  Fax number: [phone]  Revised Cardiac Risk Index (RCRI) with # point and perioperative risk of major cardiac events is #%.  Duke Activity Status Index (DASI) with > 4 mets .  From cardiac standpoint, this patient is at acceptable risk for the planned procedure without further cardiovascular testing.  Patient does NOT need to Hold Eliquis  since low risk and only 1 tooth being pulled. Will forward note to Dr. Veleria  Bifascicular block Second degree AV block, Mobitz type I s/p PPM by Dr. Cindie 06/24/2020 followed in device clinic but has not been seen since 2022  PAF (paroxysmal atrial fibrillation) (HCC) EKG today shows no recurrent afib. On exam noted regular, rate and rhythm.  Denies palpitations or active bleeding.  On AC with CBC WNL on 10/2023. Repeat CBC pending as above.  Continue on Toprol -XL as  above and Eliquis  5 mg twice daily (dose appropriate for age 59, weight 345 kg, Cr 1.41 in 10/2023)   HTN Hypertensive left ventricular hypertrophy, without heart failure PYP study equivocal and SPEP/UPEP normal. Seen by HOCM clinic. Felt this is related to long standing hypertension Reports well controlled Home BP: 110-120's/60-70's BP this OV well controlled today: 116/72 Continue on Amlodipine  10 mg daily, Lasix  40 mg daily, Valsartan  80 mg daily,  Toprol  XL as above  Encourage physical activity for 150 minutes per week and heart healthy low sodium diet. Discussed limiting sodium intake to < 2 grams daily.     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  ECHO 11/2023 mildly dilated ascending aorta measuring 42 mm.  Noted aorta is  is normal for patient's body size.  Continue to monitor  OSA (obstructive sleep apnea) Last OV, reported not complaint with CPAP since he lost device. Ordered sleep study, which is still pending.  Patient is still awaiting phone call from [facility] for sleep study. Informed patient that they are 2 months behind. Patient is aware and understands.   Informed Consent   Shared Decision Making/Informed Consent{ The risks [stroke (1 in 1000), death (1 in 1000), kidney failure [usually temporary] (1 in 500), bleeding (1 in 200), allergic reaction [possibly serious] (1 in 200)], benefits (diagnostic support and management of coronary artery disease) and alternatives of a cardiac catheterization were discussed in detail with Mr. Gumbs and he is willing to proceed.  Dispo: Follow up in 4-6 weeks post cath.   Signed, Lorette CINDERELLA Kapur, PA-C

## 2024-01-05 LAB — CBC
Hematocrit: 40.6 % (ref 37.5–51.0)
Hemoglobin: 13.3 g/dL (ref 13.0–17.7)
MCH: 31.6 pg (ref 26.6–33.0)
MCHC: 32.8 g/dL (ref 31.5–35.7)
MCV: 96 fL (ref 79–97)
Platelets: 256 x10E3/uL (ref 150–450)
RBC: 4.21 x10E6/uL (ref 4.14–5.80)
RDW: 12.7 % (ref 11.6–15.4)
WBC: 11.5 x10E3/uL — ABNORMAL HIGH (ref 3.4–10.8)

## 2024-01-05 LAB — BASIC METABOLIC PANEL WITH GFR
BUN/Creatinine Ratio: 18 (ref 9–20)
BUN: 30 mg/dL — ABNORMAL HIGH (ref 6–24)
CO2: 22 mmol/L (ref 20–29)
Calcium: 10 mg/dL (ref 8.7–10.2)
Chloride: 99 mmol/L (ref 96–106)
Creatinine, Ser: 1.63 mg/dL — ABNORMAL HIGH (ref 0.76–1.27)
Glucose: 174 mg/dL — ABNORMAL HIGH (ref 70–99)
Potassium: 5.1 mmol/L (ref 3.5–5.2)
Sodium: 138 mmol/L (ref 134–144)
eGFR: 48 mL/min/1.73 — ABNORMAL LOW (ref >59)

## 2024-01-06 ENCOUNTER — Other Ambulatory Visit: Payer: Self-pay | Admitting: Physician Assistant

## 2024-01-06 ENCOUNTER — Ambulatory Visit: Payer: Self-pay | Admitting: Physician Assistant

## 2024-01-06 ENCOUNTER — Encounter: Payer: Self-pay | Admitting: Physician Assistant

## 2024-01-06 NOTE — Telephone Encounter (Signed)
 Reached pt to remind them of there appt on 01/10/2024 @ 9 am.

## 2024-01-10 ENCOUNTER — Other Ambulatory Visit: Payer: Self-pay | Admitting: Physician Assistant

## 2024-01-10 ENCOUNTER — Telehealth: Payer: Self-pay | Admitting: *Deleted

## 2024-01-10 ENCOUNTER — Telehealth: Payer: Self-pay | Admitting: Physician Assistant

## 2024-01-10 NOTE — Telephone Encounter (Signed)
    Peer-to-Peer Call Update (Case #[id number]): Per phone call with Dr. Gladis Schneider, the cardiac catheterization was denied as current documentation does not meet criteria for approval. Specifically, the nuclear stress test shows a 7% ischemic burden, while guidelines require >10% in a patient without chest pain. This patient denies chest pain but reports shortness of breath; however, SOB alone is not considered an anginal equivalent for CAD in this review.  I emphasized that the patient's cardiologist recommended cardiac catheterization based on the nuclear stress findings, and I also referenced the prior cath from 2018. Dr. Schneider advised that a CCTA would be more readily approved under this case number. If the CCTA demonstrates high-risk findings, then cardiac catheterization may be approved in the context of the patient's shortness of breath despite no chest pain.    I updated Dr. Shlomo with this information and she agrees to proceed with CCTA. Will plan to order CCTA (DX; Abnormal Stress Test, Dyspnea) if patient agrees.    Signed, Lorette CINDERELLA Kapur, PA-C 01/10/2024, 2:39 PM Pager: [phone]    Signed, Lorette CINDERELLA Kapur, PA-C 01/10/2024, 2:48 PM Pager: [phone]

## 2024-01-10 NOTE — Telephone Encounter (Addendum)
 Cardiac Catheterization scheduled at [facility] for: Wednesday January 11, 2024 10 AM Arrival time [facility] Main Entrance A at: 7:30- 8 AM-needs BMP  Diet: -Nothing to eat after midnight prior to procedure.  Hydration: -May drink clear liquids until leaving for hospital. Approved liquids: Water, clear tea, black coffee, fruit juices-non-citric and without pulp,Gatorade, plain Jello/popsicles.  No PO hydration (bottle of water)-see notes on 01/04/24 BMP results-pt will get IV hydration at the hospital  Medication instructions: -Hold:  Eliquis -none 01/09/24 until post procedure   Lasix -day before and day of procedure-per protocol GFR < 60 (48)  Pt tells me he does not currently take valsartan .  Insulin -AM of procedure/1/2 usual dose PM prior to procedure  Dulaglutide -weekly on Thursdays. -Other usual morning medications can be taken including aspirin  81 mg.  Plan to go home the same day, you will only stay overnight if medically necessary.  You must have responsible adult to drive you home.  Someone must be with you the first 24 hours after you arrive home.  Reviewed procedure instructions with patient.

## 2024-01-10 NOTE — Progress Notes (Signed)
     Peer-to-Peer Call Update (Case #[id number]): Per phone call with Dr. Gladis Schneider, the cardiac catheterization was denied as current documentation does not meet criteria for approval. Specifically, the nuclear stress test shows a 7% ischemic burden, while guidelines require >10% in a patient without chest pain. This patient denies chest pain but reports shortness of breath; however, SOB alone is not considered an anginal equivalent for CAD in this review.  I emphasized that the patient's cardiologist recommended cardiac catheterization based on the nuclear stress findings, and I also referenced the prior cath from 2018. Dr. Schneider advised that a CCTA would be more readily approved under this case number. If the CCTA demonstrates high-risk findings, then cardiac catheterization may be approved in the context of the patient's shortness of breath despite no chest pain.   I updated Dr. Shlomo with this information and she agrees to proceed with CCTA. Will plan to order CCTA if patient agrees.   Signed, Lorette CINDERELLA Kapur, PA-C 01/10/2024, 2:39 PM Pager: [phone]

## 2024-01-11 ENCOUNTER — Encounter (HOSPITAL_COMMUNITY): Admission: RE | Payer: Self-pay | Source: Home / Self Care

## 2024-01-11 ENCOUNTER — Ambulatory Visit (HOSPITAL_COMMUNITY): Admission: RE | Admit: 2024-01-11 | Source: Home / Self Care | Admitting: Cardiology

## 2024-01-11 DIAGNOSIS — I25119 Atherosclerotic heart disease of native coronary artery with unspecified angina pectoris: Secondary | ICD-10-CM

## 2024-01-11 DIAGNOSIS — N1831 Chronic kidney disease, stage 3a: Secondary | ICD-10-CM

## 2024-01-11 SURGERY — LEFT HEART CATH AND CORONARY ANGIOGRAPHY
Anesthesia: LOCAL

## 2024-01-11 NOTE — Telephone Encounter (Signed)
 Spoke with Pt. Told pt that a plan was being worked on by Dr Shlomo and her nurse, and that she would advise him on next steps. Patient stated understanding.

## 2024-01-11 NOTE — Telephone Encounter (Signed)
 Pt requesting a c/b in regards to his Cath procedure.

## 2024-01-18 ENCOUNTER — Telehealth: Payer: Self-pay

## 2024-01-18 ENCOUNTER — Other Ambulatory Visit (HOSPITAL_COMMUNITY): Payer: Self-pay

## 2024-01-18 DIAGNOSIS — Z01812 Encounter for preprocedural laboratory examination: Secondary | ICD-10-CM

## 2024-01-18 DIAGNOSIS — I452 Bifascicular block: Secondary | ICD-10-CM

## 2024-01-18 DIAGNOSIS — R0602 Shortness of breath: Secondary | ICD-10-CM

## 2024-01-18 DIAGNOSIS — Z79899 Other long term (current) drug therapy: Secondary | ICD-10-CM

## 2024-01-18 DIAGNOSIS — I5032 Chronic diastolic (congestive) heart failure: Secondary | ICD-10-CM

## 2024-01-18 DIAGNOSIS — I25119 Atherosclerotic heart disease of native coronary artery with unspecified angina pectoris: Secondary | ICD-10-CM

## 2024-01-18 MED ORDER — METOPROLOL TARTRATE 100 MG PO TABS: 100.0000 mg | ORAL_TABLET | Freq: Once | ORAL | 0 refills | Status: DC

## 2024-01-18 MED ORDER — METOPROLOL TARTRATE 100 MG PO TABS
100.0000 mg | ORAL_TABLET | Freq: Once | ORAL | 0 refills | Status: DC
  Filled 2024-01-18: qty 1, 1d supply, fill #0

## 2024-01-18 NOTE — Telephone Encounter (Signed)
-----   Message from [name] sent at 01/16/2024  5:42 PM EDT ----- I thought he was going to get a coronary CTA ----- Message ----- From: [name], RN Sent: 01/16/2024  10:00 AM EDT To: [name], MD; [name], RN  Allen Gould,  Patient's cath scheduled for 8/20 was denied, we sent a note to [employer] to see if we had any other options to do a peer to peer. No response. That may be because a peer-to-peer was documented on 01/10/24, but the note reads more like an explanation of denial. Is there any way to get more information about why the test was denied? Or is ordering the CTA our only option? Geni, RN

## 2024-01-18 NOTE — Telephone Encounter (Signed)
 Phone call to patient to advise that insurance denied cath and coronary CTA has been ordered. Labs and Coronary CTA ordered, lab and medication orders faxed to the [facility] in [place] per patient request.Fax # is [phone]

## 2024-01-18 NOTE — Telephone Encounter (Signed)
Patient would like to speak with you.

## 2024-01-24 ENCOUNTER — Other Ambulatory Visit (HOSPITAL_COMMUNITY): Payer: Self-pay | Admitting: Emergency Medicine

## 2024-01-24 ENCOUNTER — Telehealth (HOSPITAL_COMMUNITY): Payer: Self-pay | Admitting: Emergency Medicine

## 2024-01-24 ENCOUNTER — Telehealth: Payer: Self-pay

## 2024-01-24 DIAGNOSIS — Z01812 Encounter for preprocedural laboratory examination: Secondary | ICD-10-CM

## 2024-01-24 MED ORDER — METOPROLOL TARTRATE 100 MG PO TABS: 100.0000 mg | ORAL_TABLET | Freq: Once | ORAL | 0 refills | Status: DC

## 2024-01-24 NOTE — Telephone Encounter (Signed)
 Spoke to Camie Shutter, RN, CT cardiac imaging nurse navigator. Patient ordered to have coronary CTA, was unable to complete BMET and pick up lopressor  via his ALF in time for CTA tomorrow 01/25/24. CTA canceled. Call to patient to advise that CTA could not be scheduled until BMET is scheduled and lopressor  picked up. Patient states he is able to come to our building tomorrow for labs and to pick up lopressor . Replaced order for BMET, sent one time lopressor  dose to [facility] on 1st floor. Patient verbalizes understanding he can come by anytime tomorrow to complete labs and pick up medication, he does not need to be fasting. Once labs result, CTA can be scheduled.

## 2024-01-24 NOTE — Telephone Encounter (Signed)
 Please see office visit note 01/04/24 and encounter note 01/10/24

## 2024-01-24 NOTE — Telephone Encounter (Signed)
 Pt states he needs to r/s this appt but needed to hop off phone. Will need follow up phone call to r/s the CCTA appt.  Camie Shutter RN Navigator Cardiac Imaging [facility] [phone] Office  [phone] Cell

## 2024-01-25 ENCOUNTER — Ambulatory Visit (HOSPITAL_COMMUNITY): Admission: RE | Admit: 2024-01-25 | Source: Ambulatory Visit

## 2024-01-25 ENCOUNTER — Encounter (HOSPITAL_COMMUNITY): Payer: Self-pay

## 2024-01-26 ENCOUNTER — Ambulatory Visit: Payer: Self-pay | Admitting: Cardiology

## 2024-01-26 LAB — BASIC METABOLIC PANEL WITH GFR
BUN/Creatinine Ratio: 13 (ref 9–20)
BUN: 17 mg/dL (ref 6–24)
CO2: 22 mmol/L (ref 20–29)
Calcium: 9.8 mg/dL (ref 8.7–10.2)
Chloride: 100 mmol/L (ref 96–106)
Creatinine, Ser: 1.32 mg/dL — ABNORMAL HIGH (ref 0.76–1.27)
Glucose: 120 mg/dL — ABNORMAL HIGH (ref 70–99)
Potassium: 4.6 mmol/L (ref 3.5–5.2)
Sodium: 139 mmol/L (ref 134–144)
eGFR: 62 mL/min/1.73 (ref >59)

## 2024-02-16 ENCOUNTER — Encounter (HOSPITAL_COMMUNITY): Payer: Self-pay

## 2024-02-20 ENCOUNTER — Ambulatory Visit (HOSPITAL_COMMUNITY)
Admission: RE | Admit: 2024-02-20 | Discharge: 2024-02-20 | Disposition: A | Source: Ambulatory Visit | Attending: Cardiovascular Disease | Admitting: Cardiovascular Disease

## 2024-02-20 ENCOUNTER — Other Ambulatory Visit: Payer: Self-pay | Admitting: Cardiovascular Disease

## 2024-02-20 ENCOUNTER — Ambulatory Visit (HOSPITAL_COMMUNITY)
Admission: RE | Admit: 2024-02-20 | Discharge: 2024-02-20 | Disposition: A | Source: Ambulatory Visit | Attending: Cardiology | Admitting: Cardiology

## 2024-02-20 DIAGNOSIS — R931 Abnormal findings on diagnostic imaging of heart and coronary circulation: Secondary | ICD-10-CM | POA: Insufficient documentation

## 2024-02-20 DIAGNOSIS — I77810 Thoracic aortic ectasia: Secondary | ICD-10-CM | POA: Insufficient documentation

## 2024-02-20 DIAGNOSIS — R0602 Shortness of breath: Secondary | ICD-10-CM

## 2024-02-20 DIAGNOSIS — I25119 Atherosclerotic heart disease of native coronary artery with unspecified angina pectoris: Secondary | ICD-10-CM | POA: Insufficient documentation

## 2024-02-20 MED ORDER — IOHEXOL 350 MG/ML SOLN
100.0000 mL | Freq: Once | INTRAVENOUS | Status: AC | PRN
Start: 2024-02-20 — End: 2024-02-20
  Administered 2024-02-20: 100 mL via INTRAVENOUS

## 2024-02-20 MED ORDER — NITROGLYCERIN 0.4 MG SL SUBL
0.8000 mg | SUBLINGUAL_TABLET | Freq: Once | SUBLINGUAL | Status: AC
  Administered 2024-02-20: 0.8 mg via SUBLINGUAL

## 2024-02-21 ENCOUNTER — Encounter: Payer: Self-pay | Admitting: Cardiology

## 2024-02-21 ENCOUNTER — Ambulatory Visit: Payer: Self-pay | Admitting: Cardiology

## 2024-02-21 DIAGNOSIS — I5032 Chronic diastolic (congestive) heart failure: Secondary | ICD-10-CM

## 2024-02-21 DIAGNOSIS — R911 Solitary pulmonary nodule: Secondary | ICD-10-CM

## 2024-02-23 NOTE — Telephone Encounter (Signed)
 Call to patient to review results. Discussed that last BMP was normal. Discussed that Coronary CTA showed a coronary calcium  score of 1246 with 50 to 69% proximal to mid LAD, 25 to 49% distal LAD, 25 to 49% proximal to mid D2 and left circumflex, 50 to 69% proximal to mid OM1, 25 to 49% proximal to mid RCA, less than 25% distal RCA/PDA, 25 to 49% PLA.  Explained that Dr. Shlomo would like him to be set up for office visit with extender to get set up for a left heart  Catheterization as FFR could not be completed on coronary CTA due to poor imaging. Patient verbalizes understanding and agrees to plan. Appt made with APP E. Monge Pa-C for 02/29/24.

## 2024-02-23 NOTE — Telephone Encounter (Signed)
-----   Message from [name] sent at 02/21/2024  3:02 PM EDT ----- Coronary CTA showed a coronary calcium  score of 1246 with 50 to 69% proximal to mid LAD, 25 to 49% distal LAD, 25 to 49% proximal to mid D2 and left circumflex, 50 to 69% proximal to mid OM1, 25 to  49% proximal to mid RCA, less than 25% distal RCA/PDA, 25 to 49% PLA.  Lipids look good in June 2025.  I would like him to be set up for office visit with extender to get set up for a left heart  catheterization.  FFR could not be completed on coronary CTA due to poor imaging from morbid obesity. ----- Message ----- From: Interface, Rad Results In Sent: 02/20/2024   5:27 PM EDT To: [name], MD

## 2024-02-28 NOTE — Progress Notes (Addendum)
 Cardiology Office Note:    Date:  03/09/2024   ID:  Allen Gould, DOB 1965-09-01, MRN [id number]  PCP:  Leontine Phebe Charlotte ONEIDA, NP   [facility] Providers Cardiologist:  [name], MD Electrophysiologist:  OLE [name], MD     Referring MD: Leontine Phebe Charlotte ONEIDA, NP   Chief Complaint  Patient presents with   Follow-up    Shortness of breath, abnormal coronary CTA results, arrange for LHC   History of Present Illness:    Allen Gould is a 58 y.o. male with a hx of CAD with mild to moderate nonobstructive disease by [facility] at [facility] in 2018, paroxysmal atrial fibrillation on Eliquis , hypertrophic cardiomyopathy, chronic HFpEF, sick sinus syndrome s/p PPM 06/2020, RBBB, LAFB, history of alcohol abuse, OSA not on CPAP, hypertension, diabetes, hepatic steatosis, CKD.  Presents to clinic today to discuss proceeding with cardiac catheterization, per recommendations of coronary CTA.   He had originally seen Dr. Bihari June 2025 for follow-up, that was overdue since 2022.  At this appointment he had reported some chest tightness and dyspnea on exertion.  It was originally thought that this may be musculoskeletal as the patient is using a wheelchair but during this appointment she had ordered a Lexiscan  Myoview  to rule out ischemia.  Lexiscan  showed mild to moderate perfusion defect in stress images in the anterior wall, worse at apex, this study was deemed an intermediate risk with findings consistent with ischemia.  At that time it was recommended by Dr. Bihari that the patient undergo cardiac catheterization.  He presented to the office in August 2025 to discuss this process with Scotesia Dunlap PA-C.  At that time a heart catheterization was ordered with plans to proceed with that however it was ultimately denied by insurance and instead the decision to proceed with a coronary CTA was made in place of a cardiac catheterization.  Coronary CTA was completed on 02/20/2024, results  showed three-vessel calcifications, calcium  score 1246, 99 percentile with possibly obstructive CAD.  [facility] was [facility] to [facility] but unfortunately it was nondiagnostic as they were unable to process the poor image quality.  These results were discussed with Dr. Bihari and she recommended that he be reset up for an office visit to discuss setting up for a cardiac catheterization.  He lives in [place] [facility] assisted living.  He primarily uses a wheelchair.  He is able to take care of himself, clean his room, transfer himself.  He reports chronic orthopnea and edema, that he notes is improved with leg elevation, but does appear to be gradually worsening. He reports compliance with all of his medications. He denies any changes in his symptoms since he was last seen. We reviewed the process and procedure of cardiac catheterization. He tells me that he has been having more LE edema and noticing some weight gain recently with he believes to be multifactorial. We discussed taking an additional PO Lasix  (80 mg total) for 3 days and then following up with BMET in 5-7 days. He is agreeable to this plan. We will get him scheduled for LHC.   Past Medical History:  Diagnosis Date   Acquired dilation of ascending aorta and aortic root    42mm by echo 09/2020 but normal on Chest CTA 05/2020.  Stable by 2D echo 11/2023 with ascending aorta 4.2 cm which is normal when indexed for body surface area   Acute kidney injury 09/01/2019   Asthma    Balanitis 08/09/2014  Bifascicular block    CAD (coronary artery disease), native coronary artery    Coronary CTA showed a coronary calcium  score of 1246 with 50 to 69% prox/mid LAD, 25 to 49% distal LAD, 25 to 49% prox/mid D2/LCx, 50 to 69% prox/mid OM1, 25 to 49% prox/mid RCA, <25% distal RCA/PDA, 25 to 49% PLA.   Cough 08/09/2014   Diabetes mellitus without complication (HCC)    Dilated aortic root    42mm on echo 09/2020   DM (diabetes mellitus) type 2, uncontrolled, without  ketoacidosis 08/10/2013   Dyslipidemia 03/01/2014   Financial difficulties 08/09/2014   GERD (gastroesophageal reflux disease)    Health care maintenance 03/01/2014   HOCM (hypertrophic obstructive cardiomyopathy) (HCC)    noted on cMRI but no SAM or LVOT gradient   Hypertension    Hypomagnesemia 05/29/2017   Insomnia 08/29/2013   Mitral stenosis    mean MVG   Morbid obesity (HCC) 08/29/2013   Near syncope 05/29/2017   Odontogenic infection of jaw 05/29/2017   PAC (premature atrial contraction)    PAF (paroxysmal atrial fibrillation) (HCC)    Pressure injury of skin 09/02/2019   PVCs (premature ventricular contractions)    Right shoulder pain 08/29/2013   Second degree AV block, Mobitz type I    Sepsis (HCC) 05/29/2017   Sleep apnea    uses cpap   Past Surgical History:  Procedure Laterality Date   APPENDECTOMY     MASS EXCISION N/A 08/19/2020   Procedure: EXCISION OF SCROTAL CYSTS;  Surgeon: Elisabeth Valli BIRCH, MD;  Location: [facility] ORS;  Service: Urology;  Laterality: N/A;  1 HR   PACEMAKER IMPLANT N/A 06/24/2020   Procedure: PACEMAKER IMPLANT;  Surgeon: Cindie Ole DASEN, MD;  Location: [facility] INVASIVE CV LAB;  Service: Cardiovascular;  Laterality: N/A;   RIGHT/LEFT HEART CATH AND CORONARY ANGIOGRAPHY N/A 03/07/2024   Procedure: RIGHT/LEFT HEART CATH AND CORONARY ANGIOGRAPHY;  Surgeon: Wendel Lurena POUR, MD;  Location: [facility] INVASIVE CV LAB;  Service: Cardiovascular;  Laterality: N/A;   Current Medications: Current Meds  Medication Sig   Accu-Chek Softclix Lancets lancets Check blood sugar 3 times a day   acetaminophen  (TYLENOL ) 500 MG tablet Take 1,000 mg by mouth 3 (three) times daily.   albuterol  (VENTOLIN  HFA) 108 (90 Base) MCG/ACT inhaler Inhale 2 puffs into the lungs every 6 (six) hours as needed for shortness of breath or wheezing.   amLODipine  (NORVASC ) 10 MG tablet Take 1 tablet (10 mg total) by mouth daily.   apixaban  (ELIQUIS ) 5 MG TABS tablet Take 1 tablet (5 mg total)  by mouth 2 (two) times daily.   aspirin  81 MG chewable tablet Chew 81 mg by mouth daily.   atorvastatin  (LIPITOR ) 80 MG tablet TAKE 1 TABLET (80 MG TOTAL) BY MOUTH DAILY. (Patient taking differently: Take 40 mg by mouth at bedtime.)   Cholecalciferol 125 MCG (5000 UT) TABS Take 5,000 Units by mouth daily.   diphenhydrAMINE (BENADRYL) 50 MG tablet Take 50 mg before leaves home for cardiac cath   Dulaglutide  3 MG/0.5ML SOAJ Inject 0.5 mLs into the skin once a week.   DULoxetine  (CYMBALTA ) 60 MG capsule Take 2 capsules (120 mg total) by mouth daily. (Patient taking differently: Take 60 mg by mouth daily.)   furosemide  (LASIX ) 40 MG tablet Take 2 tablets ( 80 mg ) every morning for 3 mornings then decrease and take 1 tablet (40 mg ) every morning. (Patient taking differently: Take 40 mg by mouth See admin instructions. Take 2  tablets ( 80 mg ) every morning for 3 mornings then decrease and take 1 tablet (40 mg ) every morning.)   gabapentin  (NEURONTIN ) 300 MG capsule TAKE 1 CAPSULE (300 MG TOTAL) BY MOUTH IN THE MORNING, AT NOON, AND AT BEDTIME.   glucose blood (ACCU-CHEK GUIDE) test strip Check blood sugar 3 times a day   guaiFENesin (ROBITUSSIN) 100 MG/5ML liquid Take 10 mLs by mouth 4 (four) times daily as needed for cough.   HUMALOG KWIKPEN 100 UNIT/ML KwikPen Inject 17 Units into the skin 3 (three) times daily with meals. Lispro Hold of BS is less than 100 and or PT is not eating Also as needed four time a day for BS Greater that 450: recheck BS in 1 hr   loratadine (CLARITIN) 10 MG tablet Take 10 mg by mouth daily.   methocarbamol  (ROBAXIN ) 500 MG tablet Take 1 tablet (500 mg total) by mouth 2 (two) times daily.   metoprolol  succinate (TOPROL  XL) 25 MG 24 hr tablet Take 1 tablet (25 mg total) by mouth at bedtime.   nystatin  (MYCOSTATIN /NYSTOP ) powder Apply 1 Application topically 3 (three) times daily. (Patient taking differently: Apply 1 Application topically 2 (two) times daily.)   pantoprazole   (PROTONIX ) 40 MG tablet TAKE 1 TABLET (40 MG TOTAL) BY MOUTH DAILY.   polyethylene glycol (MIRALAX  / GLYCOLAX ) 17 g packet Take 17 g by mouth daily as needed for mild constipation, moderate constipation or severe constipation.   predniSONE  (DELTASONE ) 50 MG tablet Take 50 mg 13 hours before cardiac cath   Take 50 mg 7 hours before cardiac cath    Take 50 mg before leaves home for cardiac cath   [DISCONTINUED] bacitracin  500 UNIT/GM ointment Apply 500 Applications topically 2 (two) times daily as needed (Apply to scrotum as needed for skin irritation).   [DISCONTINUED] furosemide  (LASIX ) 40 MG tablet Take 1 tablet (40 mg total) by mouth daily.   [DISCONTINUED] insulin  glargine-yfgn (SEMGLEE ) 100 UNIT/ML injection Inject 0.3 mLs (30 Units total) into the skin daily. (Patient not taking: Reported on 03/06/2024)    Allergies:   Shellfish allergy and Shellfish protein-containing drug products   Social History   Socioeconomic History   Marital status: Single    Spouse name: Not on file   Number of children: Not on file   Years of education: Not on file   Highest education level: Not on file  Occupational History   Not on file  Tobacco Use   Smoking status: Former    Current packs/day: 0.00    Types: Cigarettes    Quit date: 04/24/2005    Years since quitting: 18.8   Smokeless tobacco: Never  Vaping Use   Vaping status: Never Used  Substance and Sexual Activity   Alcohol use: Not Currently    Alcohol/week: 2.0 standard drinks of alcohol    Types: 2 Cans of beer per week    Comment: Last drink 8 months ago   Drug use: No   Sexual activity: Not on file  Other Topics Concern   Not on file  Social History Narrative   Not on file   Social Drivers of Health   Financial Resource Strain: High Risk (05/22/2021)   Overall Financial Resource Strain (CARDIA)    Difficulty of Paying Living Expenses: Very hard  Food Insecurity: Food Insecurity Present (10/23/2022)   Hunger Vital Sign     Worried About Running Out of Food in the Last Year: Often true    Ran Out of Food  in the Last Year: Often true  Transportation Needs: No Transportation Needs (10/24/2022)   PRAPARE - Administrator, Civil Service (Medical): No    Lack of Transportation (Non-Medical): No  Physical Activity: Not on file  Stress: Stress Concern Present (05/15/2021)   [employer] of Occupational Health - Occupational Stress Questionnaire    Feeling of Stress : Rather much  Social Connections: Not on file    Family History: The patient's family history includes Diabetes in his sister; Heart disease in an other family member; Hyperlipidemia in his sister; Kidney disease in his mother.  ROS:   Please see the history of present illness.     All other systems reviewed and are negative.  EKGs/Labs/Other Studies Reviewed:    The following studies were reviewed today:  EKG Interpretation Date/Time:  Wednesday February 29 2024 15:04:48 EDT Ventricular Rate:  60 PR Interval:  128 QRS Duration:  170 QT Interval:  458 QTC Calculation: 458 R Axis:   -85  Text Interpretation: Atrial-paced rhythm Non-specific intra-ventricular conduction block Confirmed by NATALIA BIRMINGHAM ([id number]) on 02/29/2024 3:37:34 PM    Recent Labs: 11/15/2023: ALT 23 03/01/2024: Platelets 253 03/05/2024: BUN 23; Creatinine, Ser 1.44 03/07/2024: Hemoglobin 13.9; Potassium 4.9; Sodium 138  Recent Lipid Panel    Component Value Date/Time   CHOL 102 11/15/2023 0817   TRIG 196 (H) 11/15/2023 0817   HDL 28 (L) 11/15/2023 0817   CHOLHDL 3.6 11/15/2023 0817   CHOLHDL 6.0 02/28/2014 1508   VLDL NOT CALC 02/28/2014 1508   LDLCALC 42 11/15/2023 0817   LDLDIRECT 108 (H) 03/01/2014 0409   Risk Assessment/Calculations:    CHA2DS2-VASc Score = 4   This indicates a 4.8% annual risk of stroke. The patient's score is based upon: CHF History: 1 HTN History: 1 Diabetes History: 1 Stroke History: 0 Vascular Disease History:  1 Age Score: 0 Gender Score: 0        Physical Exam:    VS:  BP (!) 130/90 (BP Location: Left Arm)   Pulse 60   Ht 5' 10 (1.778 m)   Wt (!) 368 lb (166.9 kg)   SpO2 95%   BMI 52.80 kg/m     Wt Readings from Last 3 Encounters:  03/07/24 (!) 368 lb (166.9 kg)  02/29/24 (!) 368 lb (166.9 kg)  01/04/24 (!) 345 lb 3.2 oz (156.6 kg)    GEN:  Well nourished, well developed in no acute distress HEENT: Normal NECK: No JVD; No carotid bruits LYMPHATICS: No lymphadenopathy CARDIAC: RRR, no murmurs, rubs, gallops RESPIRATORY:  Clear to auscultation without rales, wheezing or rhonchi  ABDOMEN: Soft, non-tender, non-distended MUSCULOSKELETAL:  1+ LE edema bilaterally; No deformity  SKIN: Warm and dry NEUROLOGIC:  Alert and oriented x 3 PSYCHIATRIC:  Normal affect   ASSESSMENT:    1. Coronary artery disease involving native coronary artery of native heart with angina pectoris   2. Pre-procedure lab exam   3. Abnormal findings diagnostic imaging of heart and coronary circulation   4. PAF (paroxysmal atrial fibrillation) (HCC)   5. Hyperlipidemia LDL goal <70   6. OSA (obstructive sleep apnea)   7. Benign essential HTN   8. Type 2 diabetes mellitus without complication, with long-term current use of insulin  (HCC)   9. Hypertensive left ventricular hypertrophy, without heart failure    PLAN:    In order of problems listed above:  Abnormal coronary CTA result Coronary artery disease Echo from 11/2023: LVEF 70 to 75%, hyperdynamic LV Lexi  scan 12/2023: Intermediate risk, mild to moderate perfusion defect in anterior wall, worse at apex CCTA 01/2024: Three-vessel calcium , calcium  score 1246 (99th percentile), FFR unable to be processed due to poor image quality Per recommendations of Dr. Shlomo, we will proceed with cardiac catheterization Patient denies any active angina Reports unchanged shortness of breath, chronic for several years, associated with minimal exertion as well as  at rest Will order outpatient cardiac catheterization Scheduled for LHC with Dr. Wendel 10/15 Obtaining CBC and BMET today  Instructed to hold Eliquis  2 days prior to procedure Recommend doing half PM dose insulin  night before and holding insulin  morning of LHC but adjust as needed depending on glucose levels   Hyperlipidemia, LDL goal < 70 11/15/2023: ALT 23; HDL 28; LDL Chol Calc (NIH) 42  Home meds: Lipitor  80 mg daily Continue with statin as above  Paroxysmal atrial fibrillation Home meds: Eliquis  5 mg BID, Toprol  25 mg daily EKG showed no atrial fibrillation Recent CBC shows no anemia Continue home meds with Eliquis  hold as above   Hypertension Hypertensive LVH PYP study equivocal SPEP/UPEP normal Seen by HOCM clinic, they believe this is secondary to HTN Reports well-controlled SBP at home BP initially elevated, improved with recheck Home meds: Amlodipine  10 mg daily, Lasix  40 mg daily, valsartan  80 mg daily, Toprol  25 mg daily Patient noted increase in weight over last few months, noted LE edema on exam, lungs clear Recommended to increase Lasix  to 80 mg total (2 pills) x 3 days then recheck BMET in 5-7 days  Continue to encourage low-sodium diet  Sick sinus syndrome Bifascicular block S/p PPM 06/24/2020 Device was placed by Dr. Cindie Patient has not followed with device clinic since 2022 Encouraged to continue follow-up with device clinic  OSA not on CPAP Reports previously lost a CPAP Split-night sleep study already ordered Pending sleep study Follows with Dr. Shlomo      Informed Consent   Shared Decision Making/Informed Consent The risks [stroke (1 in 1000), death (1 in 1000), kidney failure [usually temporary] (1 in 500), bleeding (1 in 200), allergic reaction [possibly serious] (1 in 200)], benefits (diagnostic support and management of coronary artery disease) and alternatives of a cardiac catheterization were discussed in detail with Mr. Petta and he is  willing to proceed.     Medication Adjustments/Labs and Tests Ordered: Current medicines are reviewed at length with the patient today.  Concerns regarding medicines are outlined above.  Orders Placed This Encounter  Procedures   Basic metabolic panel with GFR   Basic metabolic panel with GFR   CBC w/Diff   EKG 12-Lead   Meds ordered this encounter  Medications   predniSONE  (DELTASONE ) 50 MG tablet    Sig: Take 50 mg 13 hours before cardiac cath   Take 50 mg 7 hours before cardiac cath    Take 50 mg before leaves home for cardiac cath    Dispense:  3 tablet    Refill:  0   diphenhydrAMINE (BENADRYL) 50 MG tablet    Sig: Take 50 mg before leaves home for cardiac cath    Dispense:  1 tablet    Refill:  0   furosemide  (LASIX ) 40 MG tablet    Sig: Take 2 tablets ( 80 mg ) every morning for 3 mornings then decrease and take 1 tablet (40 mg ) every morning.    Dispense:  96 tablet    Refill:  3   Patient Instructions  Medication Instructions:  Take Lasix   80 mg every morning for the next 3 mornings then decrease back to 40 mg daily Continue all other medications *If you need a refill on your cardiac medications before your next appointment, please call your pharmacy*  Lab Work: Bmet,cbc   Thursday 10/9  Repeat Bmet in 5 to 7 days ( Monday 10/13 )  Testing/Procedures: Cardiac cath scheduled Wed 10/15 at [facility] Arrive at 11:00 am  Follow instructions below   Follow-Up: At [facility], you and your health needs are our priority.  As part of our continuing mission to provide you with exceptional heart care, our providers are all part of one team.  This team includes your primary Cardiologist (physician) and Advanced Practice Providers or APPs (Physician Assistants and Nurse Practitioners) who all work together to provide you with the care you need, when you need it.  Your next appointment:  2 weeks after cath    Wed 10/29 at 1:55 pm    Provider: Damien Braver  NP   [facility] A DEPT OF [facility] [address] A DEPT OF THE [place] [address] [place] [address] Dept: [phone] Loc: [phone]  LINDBERGH WINKLES  02/29/2024  You are scheduled for a Cardiac Catheterization on Wednesday, October 15 with Dr. Lurena Red.  1. Please arrive at the [facility] (Main Entrance A) at [facility]: [address] [place], [place] [address] at 11:00 AM (This time is 2 hour(s) before your procedure to ensure your preparation).   Free valet parking service is available. You will check in at ADMITTING. The support person will be asked to wait in the waiting room.  It is OK to have someone drop you off and come back when you are ready to be discharged.    Special note: Every effort is made to have your procedure done on time. Please understand that emergencies sometimes delay scheduled procedures.  2. Diet: Nothing to eat after midnight.   3. Hydration: You need to be well hydrated before your procedure. On October 15, you may drink approved liquids (see below) until 2 hours before the procedure, with 16 oz of water as your last intake.   List of approved liquids water, clear juice, clear tea, black coffee, fruit juices, non-citric and without pulp, carbonated beverages, Gatorade, Kool -Aid, plain Jello-O and plain ice popsicles.  4. Labs: You will need to have blood drawn on today.  5. Medication instructions in preparation for your procedure:   Contrast Allergy: Yes, Please take Prednisone  50mg  by mouth at: Thirteen hours prior to cath  Seven hours prior to cath  And prior to leaving home please take last dose of Prednisone  50mg  and Benadryl 50mg  by mouth.   Hold Eliquis  2 days before Cath   Take 1/2 dose of insulin  night before cath and Hold morning dose day of cath  Hold Lasix  morning of cath   On the morning of your procedure, take your Aspirin  81 mg and any morning  medicines NOT listed above.  You may use sips of water.  6. Plan to go home the same day, you will only stay overnight if medically necessary. 7. Bring a current list of your medications and current insurance cards. 8. You MUST have a responsible person to drive you home. 9. Someone MUST be with you the first 24 hours after you arrive home or your discharge will be delayed. 10. Please wear clothes that  are easy to get on and off and wear slip-on shoes.  Thank you for allowing us  to care for you!   -- [facility]      We recommend signing up for the patient portal called MyChart.  Sign up information is provided on this After Visit Summary.  MyChart is used to connect with patients for Virtual Visits (Telemedicine).  Patients are able to view lab/test results, encounter notes, upcoming appointments, etc.  Non-urgent messages can be sent to your provider as well.   To learn more about what you can do with MyChart, go to [email or web address].          Signed, Waddell DELENA Donath, PA-C  03/09/2024 1:55 PM    [facility]

## 2024-02-28 NOTE — H&P (View-Only) (Signed)
 Cardiology Office Note:    Date:  02/29/2024   ID:  Allen Gould, DOB [birth year], MRN [id number]  PCP:  Leontine Phebe Charlotte ONEIDA, NP   [facility] Providers Cardiologist:  [name], MD Electrophysiologist:  OLE [name], MD     Referring MD: Leontine Phebe Charlotte ONEIDA, NP   Chief Complaint  Patient presents with   Follow-up    Shortness of breath, abnormal coronary CTA results, arrange for LHC   History of Present Illness:    Allen Gould is a 58 y.o. male with a hx of CAD with mild to moderate nonobstructive disease by [facility] in 2018, paroxysmal atrial fibrillation on Eliquis , hypertrophic cardiomyopathy, chronic HFpEF, sick sinus syndrome s/p PPM 06/2020, RBBB, LAFB, history of alcohol abuse, OSA not on CPAP, hypertension, diabetes, hepatic steatosis, CKD.  Presents to clinic today to discuss proceeding with cardiac catheterization, per recommendations of coronary CTA.   He had originally seen Dr. Bihari June 2025 for follow-up, that was overdue since 2022.  At this appointment he had reported some chest tightness and dyspnea on exertion.  It was originally thought that this may be musculoskeletal as the patient is using a wheelchair but during this appointment she had ordered a Lexiscan  Myoview  to rule out ischemia.  Lexiscan  showed mild to moderate perfusion defect in stress images in the anterior wall, worse at apex, this study was deemed an intermediate risk with findings consistent with ischemia.  At that time it was recommended by Dr. Bihari that the patient undergo cardiac catheterization.  He presented to the office in August 2025 to discuss this process with Scotesia Dunlap PA-C.  At that time a heart catheterization was ordered with plans to proceed with that however it was ultimately denied by insurance and instead the decision to proceed with a coronary CTA was made in place of a cardiac catheterization.  Coronary CTA was completed on 02/20/2024, results  showed three-vessel calcifications, calcium  score 1246, 99 percentile with possibly obstructive CAD.  It was sent to [facility] but unfortunately it was nondiagnostic as they were unable to process the poor image quality.  These results were discussed with Dr. Bihari and she recommended that he be reset up for an office visit to discuss setting up for a cardiac catheterization.  He lives in [facility] assisted living.  He primarily uses a wheelchair.  He is able to take care of himself, clean his room, transfer himself.  He reports chronic orthopnea and edema, that he notes is improved with leg elevation, but does appear to be gradually worsening. He reports compliance with all of his medications. He denies any changes in his symptoms since he was last seen. We reviewed the process and procedure of cardiac catheterization. He tells me that he has been having more LE edema and noticing some weight gain recently with he believes to be multifactorial. We discussed taking an additional PO Lasix  (80 mg total) for 3 days and then following up with BMET in 5-7 days. He is agreeable to this plan. We will get him scheduled for LHC.   Past Medical History:  Diagnosis Date   Acquired dilation of ascending aorta and aortic root    42mm by echo 09/2020 but normal on Chest CTA 05/2020.  Stable by 2D echo 11/2023 with ascending aorta 4.2 cm which is normal when indexed for body surface area   Acute kidney injury 09/01/2019   Asthma    Balanitis 08/09/2014  Bifascicular block    CAD (coronary artery disease), native coronary artery    Coronary CTA showed a coronary calcium  score of 1246 with 50 to 69% prox/mid LAD, 25 to 49% distal LAD, 25 to 49% prox/mid D2/LCx, 50 to 69% prox/mid OM1, 25 to 49% prox/mid RCA, <25% distal RCA/PDA, 25 to 49% PLA.   Cough 08/09/2014   Diabetes mellitus without complication (HCC)    Dilated aortic root    42mm on echo 09/2020   DM (diabetes mellitus) type 2, uncontrolled, without  ketoacidosis 08/10/2013   Dyslipidemia 03/01/2014   Financial difficulties 08/09/2014   GERD (gastroesophageal reflux disease)    Health care maintenance 03/01/2014   HOCM (hypertrophic obstructive cardiomyopathy) (HCC)    noted on cMRI but no SAM or LVOT gradient   Hypertension    Hypomagnesemia 05/29/2017   Insomnia 08/29/2013   Mitral stenosis    mean MVG   Morbid obesity (HCC) 08/29/2013   Near syncope 05/29/2017   Odontogenic infection of jaw 05/29/2017   PAC (premature atrial contraction)    PAF (paroxysmal atrial fibrillation) (HCC)    Pressure injury of skin 09/02/2019   PVCs (premature ventricular contractions)    Right shoulder pain 08/29/2013   Second degree AV block, Mobitz type I    Sepsis (HCC) 05/29/2017   Sleep apnea    uses cpap   Past Surgical History:  Procedure Laterality Date   APPENDECTOMY     MASS EXCISION N/A 08/19/2020   Procedure: EXCISION OF SCROTAL CYSTS;  Surgeon: Elisabeth Valli BIRCH, MD;  Location: [facility];  Service: Urology;  Laterality: N/A;  1 HR   PACEMAKER IMPLANT N/A 06/24/2020   Procedure: PACEMAKER IMPLANT;  Surgeon: Cindie Ole DASEN, MD;  Location: [facility] INVASIVE CV LAB;  Service: Cardiovascular;  Laterality: N/A;   Current Medications: Current Meds  Medication Sig   Accu-Chek Softclix Lancets lancets Check blood sugar 3 times a day   acetaminophen  (TYLENOL ) 500 MG tablet Take 1,000 mg by mouth 3 (three) times daily.   albuterol  (VENTOLIN  HFA) 108 (90 Base) MCG/ACT inhaler Inhale 2 puffs into the lungs every 6 (six) hours as needed for shortness of breath or wheezing.   amLODipine  (NORVASC ) 10 MG tablet Take 1 tablet (10 mg total) by mouth daily.   apixaban  (ELIQUIS ) 5 MG TABS tablet Take 1 tablet (5 mg total) by mouth 2 (two) times daily.   aspirin  81 MG chewable tablet Chew 81 mg by mouth daily.   atorvastatin  (LIPITOR ) 80 MG tablet TAKE 1 TABLET (80 MG TOTAL) BY MOUTH DAILY. (Patient taking differently: Take 40 mg by mouth at  bedtime.)   bacitracin  500 UNIT/GM ointment Apply 500 Applications topically 2 (two) times daily as needed (Apply to scrotum as needed for skin irritation).   Cholecalciferol 50 MCG (2000 UT) TABS Take 2,000 Units by mouth daily.   Dulaglutide  3 MG/0.5ML SOAJ Inject 0.5 mLs into the skin once a week.   DULoxetine  (CYMBALTA ) 60 MG capsule Take 2 capsules (120 mg total) by mouth daily. (Patient taking differently: Take 60 mg by mouth daily.)   furosemide  (LASIX ) 40 MG tablet Take 1 tablet (40 mg total) by mouth daily.   gabapentin  (NEURONTIN ) 300 MG capsule TAKE 1 CAPSULE (300 MG TOTAL) BY MOUTH IN THE MORNING, AT NOON, AND AT BEDTIME.   glucose blood (ACCU-CHEK GUIDE) test strip Check blood sugar 3 times a day   guaiFENesin (ROBITUSSIN) 100 MG/5ML liquid Take 10 mLs by mouth 4 (four) times daily as needed  for cough.   HUMALOG KWIKPEN 100 UNIT/ML KwikPen Inject 11 Units into the skin 3 (three) times daily. Hold of BS is less than 100 and or PT is not eating   insulin  glargine-yfgn (SEMGLEE ) 100 UNIT/ML injection Inject 0.3 mLs (30 Units total) into the skin daily. (Patient taking differently: Inject 20-40 Units into the skin See admin instructions. Take 20 units in the morning and 40 units at bedtime)   loratadine (CLARITIN) 10 MG tablet Take 10 mg by mouth daily.   methocarbamol  (ROBAXIN ) 500 MG tablet Take 1 tablet (500 mg total) by mouth 2 (two) times daily.   metoprolol  succinate (TOPROL  XL) 25 MG 24 hr tablet Take 1 tablet (25 mg total) by mouth at bedtime.   nystatin  (MYCOSTATIN /NYSTOP ) powder Apply 1 Application topically 3 (three) times daily.   pantoprazole  (PROTONIX ) 40 MG tablet TAKE 1 TABLET (40 MG TOTAL) BY MOUTH DAILY.   polyethylene glycol (MIRALAX  / GLYCOLAX ) 17 g packet Take 17 g by mouth daily as needed for mild constipation, moderate constipation or severe constipation.    Allergies:   Shellfish allergy and Shellfish-derived products   Social History   Socioeconomic History    Marital status: Single    Spouse name: Not on file   Number of children: Not on file   Years of education: Not on file   Highest education level: Not on file  Occupational History   Not on file  Tobacco Use   Smoking status: Former    Current packs/day: 0.00    Types: Cigarettes    Quit date: 04/24/2005    Years since quitting: 18.8   Smokeless tobacco: Never  Vaping Use   Vaping status: Never Used  Substance and Sexual Activity   Alcohol use: Not Currently    Alcohol/week: 2.0 standard drinks of alcohol    Types: 2 Cans of beer per week    Comment: Last drink 8 months ago   Drug use: No   Sexual activity: Not on file  Other Topics Concern   Not on file  Social History Narrative   Not on file   Social Drivers of Health   Financial Resource Strain: High Risk (05/22/2021)   Overall Financial Resource Strain (CARDIA)    Difficulty of Paying Living Expenses: Very hard  Food Insecurity: Food Insecurity Present (10/23/2022)   Hunger Vital Sign    Worried About Running Out of Food in the Last Year: Often true    Ran Out of Food in the Last Year: Often true  Transportation Needs: No Transportation Needs (10/24/2022)   PRAPARE - Administrator, Civil Service (Medical): No    Lack of Transportation (Non-Medical): No  Physical Activity: Not on file  Stress: Stress Concern Present (05/15/2021)   [employer] of Occupational Health - Occupational Stress Questionnaire    Feeling of Stress : Rather much  Social Connections: Not on file    Family History: The patient's family history includes Diabetes in his sister; Heart disease in an other family member; Hyperlipidemia in his sister; Kidney disease in his mother.  ROS:   Please see the history of present illness.     All other systems reviewed and are negative.  EKGs/Labs/Other Studies Reviewed:    The following studies were reviewed today:  EKG Interpretation Date/Time:  Wednesday February 29 2024 15:04:48  EDT Ventricular Rate:  60 PR Interval:  128 QRS Duration:  170 QT Interval:  458 QTC Calculation: 458 R Axis:   -85  Text Interpretation: Atrial-paced rhythm Non-specific intra-ventricular conduction block Confirmed by NATALIA BIRMINGHAM ([id number]) on 02/29/2024 3:37:34 PM    Recent Labs: 11/15/2023: ALT 23 01/04/2024: Hemoglobin 13.3; Platelets 256 01/25/2024: BUN 17; Creatinine, Ser 1.32; Potassium 4.6; Sodium 139  Recent Lipid Panel    Component Value Date/Time   CHOL 102 11/15/2023 0817   TRIG 196 (H) 11/15/2023 0817   HDL 28 (L) 11/15/2023 0817   CHOLHDL 3.6 11/15/2023 0817   CHOLHDL 6.0 02/28/2014 1508   VLDL NOT CALC 02/28/2014 1508   LDLCALC 42 11/15/2023 0817   LDLDIRECT 108 (H) 03/01/2014 0409   Risk Assessment/Calculations:    CHA2DS2-VASc Score = 4   This indicates a 4.8% annual risk of stroke. The patient's score is based upon: CHF History: 1 HTN History: 1 Diabetes History: 1 Stroke History: 0 Vascular Disease History: 1 Age Score: 0 Gender Score: 0     HYPERTENSION CONTROL Vitals:   02/29/24 1501 02/29/24 1613  BP: (!) 154/92 (!) 130/90    The patient's blood pressure is elevated above target today.  In order to address the patient's elevated BP: The blood pressure is usually elevated in clinic.  Blood pressures monitored at home have been optimal.          Physical Exam:    VS:  BP (!) 130/90 (BP Location: Left Arm)   Pulse 60   Ht 5' 10 (1.778 m)   Wt (!) 368 lb (166.9 kg)   SpO2 95%   BMI 52.80 kg/m     Wt Readings from Last 3 Encounters:  02/29/24 (!) 368 lb (166.9 kg)  01/04/24 (!) 345 lb 3.2 oz (156.6 kg)  11/09/23 (!) 338 lb (153.3 kg)    GEN:  Well nourished, well developed in no acute distress HEENT: Normal NECK: No JVD; No carotid bruits LYMPHATICS: No lymphadenopathy CARDIAC: RRR, no murmurs, rubs, gallops RESPIRATORY:  Clear to auscultation without rales, wheezing or rhonchi  ABDOMEN: Soft, non-tender,  non-distended MUSCULOSKELETAL:  1+ LE edema bilaterally; No deformity  SKIN: Warm and dry NEUROLOGIC:  Alert and oriented x 3 PSYCHIATRIC:  Normal affect   ASSESSMENT:    1. Coronary artery disease involving native coronary artery of native heart with angina pectoris   2. Pre-procedure lab exam   3. Abnormal findings diagnostic imaging of heart and coronary circulation   4. PAF (paroxysmal atrial fibrillation) (HCC)   5. Hyperlipidemia LDL goal <70   6. OSA (obstructive sleep apnea)   7. Benign essential HTN   8. Type 2 diabetes mellitus with complication, with long-term current use of insulin  (HCC)   9. Hypertensive left ventricular hypertrophy, without heart failure    PLAN:    In order of problems listed above:  Abnormal coronary CTA result Coronary artery disease Echo from 11/2023: LVEF 70 to 75%, hyperdynamic LV Lexi scan 12/2023: Intermediate risk, mild to moderate perfusion defect in anterior wall, worse at apex CCTA 01/2024: Three-vessel calcium , calcium  score 1246 (99th percentile), FFR unable to be processed due to poor image quality Per recommendations of Dr. Shlomo, we will proceed with cardiac catheterization Patient denies any active angina Reports unchanged shortness of breath, chronic for several years, associated with minimal exertion as well as at rest Will order outpatient cardiac catheterization Scheduled for LHC with Dr. Wendel 10/15 Obtaining CBC and BMET today  Instructed to hold Eliquis  2 days prior to procedure Recommend doing half PM dose insulin  night before and holding insulin  morning of LHC but adjust as needed depending on  glucose levels   Hyperlipidemia, LDL goal < 70 11/15/2023: ALT 23; HDL 28; LDL Chol Calc (NIH) 42  Home meds: Lipitor  80 mg daily Continue with statin as above  Paroxysmal atrial fibrillation Home meds: Eliquis  5 mg BID, Toprol  25 mg daily EKG showed no atrial fibrillation Recent CBC shows no anemia Continue home meds with  Eliquis  hold as above   Hypertension Hypertensive LVH PYP study equivocal SPEP/UPEP normal Seen by HOCM clinic, they believe this is secondary to HTN Reports well-controlled SBP at home BP initially elevated, improved with recheck Home meds: Amlodipine  10 mg daily, Lasix  40 mg daily, valsartan  80 mg daily, Toprol  25 mg daily Patient noted increase in weight over last few months, noted LE edema on exam, lungs clear Recommended to increase Lasix  to 80 mg total (2 pills) x 3 days then recheck BMET in 5-7 days  Continue to encourage low-sodium diet  Sick sinus syndrome Bifascicular block S/p PPM 06/24/2020 Device was placed by Dr. Cindie Patient has not followed with device clinic since 2022 Encouraged to continue follow-up with device clinic  OSA not on CPAP Reports previously lost a CPAP Split-night sleep study already ordered Pending sleep study Follows with Dr. Shlomo      Informed Consent   Shared Decision Making/Informed Consent The risks [stroke (1 in 1000), death (1 in 1000), kidney failure [usually temporary] (1 in 500), bleeding (1 in 200), allergic reaction [possibly serious] (1 in 200)], benefits (diagnostic support and management of coronary artery disease) and alternatives of a cardiac catheterization were discussed in detail with Mr. Flott and he is willing to proceed.     Medication Adjustments/Labs and Tests Ordered: Current medicines are reviewed at length with the patient today.  Concerns regarding medicines are outlined above.  Orders Placed This Encounter  Procedures   Basic metabolic panel with GFR   Basic metabolic panel with GFR   CBC w/Diff   EKG 12-Lead   No orders of the defined types were placed in this encounter.  Patient Instructions  Medication Instructions:  Take Lasix  80 mg every morning for the next 3 mornings then decrease back to 40 mg daily Continue all other medications *If you need a refill on your cardiac medications before your next  appointment, please call your pharmacy*  Lab Work: Bmet,cbc  today  Repeat Bmet in 5 to 7 days  Testing/Procedures: Cardiac cath scheduled Wed 10/15 at [facility] Arrive at 11:00 am  Follow instructions below   Follow-Up: At [facility], you and your health needs are our priority.  As part of our continuing mission to provide you with exceptional heart care, our providers are all part of one team.  This team includes your primary Cardiologist (physician) and Advanced Practice Providers or APPs (Physician Assistants and Nurse Practitioners) who all work together to provide you with the care you need, when you need it.  Your next appointment:  2 weeks after cath    Provider: Damien Braver NP   [facility] A DEPT OF [place] AT [address] A DEPT OF THE [place] [address] [place] [address] Dept: [phone] Loc: [phone]  ERYK BEAVERS  02/29/2024  You are scheduled for a Cardiac Catheterization on Wednesday, October 15 with Dr. Lurena Red.  1. Please arrive at the [facility] (Main Entrance A) at [facility]: [address] [place], [place] [address] at 11:00 AM (This time is 2  hour(s) before your procedure to ensure your preparation).   Free valet parking service is available. You will check in at ADMITTING. The support person will be asked to wait in the waiting room.  It is OK to have someone drop you off and come back when you are ready to be discharged.    Special note: Every effort is made to have your procedure done on time. Please understand that emergencies sometimes delay scheduled procedures.  2. Diet: Nothing to eat after midnight.   3. Hydration: You need to be well hydrated before your procedure. On October 15, you may drink approved liquids (see below) until 2 hours before the procedure, with 16 oz of water as your last intake.   List of approved liquids water, clear  juice, clear tea, black coffee, fruit juices, non-citric and without pulp, carbonated beverages, Gatorade, Kool -Aid, plain Jello-O and plain ice popsicles.  4. Labs: You will need to have blood drawn on today.  5. Medication instructions in preparation for your procedure:   Contrast Allergy: Yes, Please take Prednisone  50mg  by mouth at: Thirteen hours prior to cath  Seven hours prior to cath  And prior to leaving home please take last dose of Prednisone  50mg  and Benadryl 50mg  by mouth.  Hold Eliquis  2 days before Cath     On the morning of your procedure, take your Aspirin  81 mg and any morning medicines NOT listed above.  You may use sips of water.  6. Plan to go home the same day, you will only stay overnight if medically necessary. 7. Bring a current list of your medications and current insurance cards. 8. You MUST have a responsible person to drive you home. 9. Someone MUST be with you the first 24 hours after you arrive home or your discharge will be delayed. 10. Please wear clothes that are easy to get on and off and wear slip-on shoes.  Thank you for allowing us  to care for you!   -- [facility]      We recommend signing up for the patient portal called MyChart.  Sign up information is provided on this After Visit Summary.  MyChart is used to connect with patients for Virtual Visits (Telemedicine).  Patients are able to view lab/test results, encounter notes, upcoming appointments, etc.  Non-urgent messages can be sent to your provider as well.   To learn more about what you can do with MyChart, go to [email or web address].          Signed, Waddell DELENA Donath, PA-C  02/29/2024 4:56 PM    [facility]

## 2024-02-29 ENCOUNTER — Encounter: Payer: Self-pay | Admitting: Cardiology

## 2024-02-29 ENCOUNTER — Ambulatory Visit: Attending: Cardiology | Admitting: Cardiology

## 2024-02-29 VITALS — BP 130/90 | HR 60 | Ht 70.0 in | Wt 368.0 lb

## 2024-02-29 DIAGNOSIS — G4733 Obstructive sleep apnea (adult) (pediatric): Secondary | ICD-10-CM

## 2024-02-29 DIAGNOSIS — Z01812 Encounter for preprocedural laboratory examination: Secondary | ICD-10-CM

## 2024-02-29 DIAGNOSIS — R931 Abnormal findings on diagnostic imaging of heart and coronary circulation: Secondary | ICD-10-CM

## 2024-02-29 DIAGNOSIS — I10 Essential (primary) hypertension: Secondary | ICD-10-CM

## 2024-02-29 DIAGNOSIS — E118 Type 2 diabetes mellitus with unspecified complications: Secondary | ICD-10-CM

## 2024-02-29 DIAGNOSIS — I480 Paroxysmal atrial fibrillation: Secondary | ICD-10-CM | POA: Diagnosis not present

## 2024-02-29 DIAGNOSIS — E785 Hyperlipidemia, unspecified: Secondary | ICD-10-CM

## 2024-02-29 DIAGNOSIS — I119 Hypertensive heart disease without heart failure: Secondary | ICD-10-CM

## 2024-02-29 DIAGNOSIS — I25119 Atherosclerotic heart disease of native coronary artery with unspecified angina pectoris: Secondary | ICD-10-CM | POA: Diagnosis not present

## 2024-02-29 DIAGNOSIS — Z794 Long term (current) use of insulin: Secondary | ICD-10-CM

## 2024-02-29 MED ORDER — FUROSEMIDE 40 MG PO TABS: ORAL_TABLET | ORAL | 3 refills | Status: AC

## 2024-02-29 MED ORDER — PREDNISONE 50 MG PO TABS: ORAL_TABLET | ORAL | 0 refills | Status: AC

## 2024-02-29 MED ORDER — DIPHENHYDRAMINE HCL 50 MG PO TABS: ORAL_TABLET | ORAL | 0 refills | Status: AC

## 2024-02-29 NOTE — Addendum Note (Signed)
 Addended by: CHRISTIANNE CHANNING PARAS on: 02/29/2024 05:39 PM   Modules accepted: Orders

## 2024-02-29 NOTE — Patient Instructions (Addendum)
 Medication Instructions:  Take Lasix  80 mg every morning for the next 3 mornings then decrease back to 40 mg daily Continue all other medications *If you need a refill on your cardiac medications before your next appointment, please call your pharmacy*  Lab Work: Bmet,cbc   Thursday 10/9  Repeat Bmet in 5 to 7 days ( Monday 10/13 )  Testing/Procedures: Cardiac cath scheduled Wed 10/15 at [facility] Arrive at 11:00 am  Follow instructions below   Follow-Up: At [facility], you and your health needs are our priority.  As part of our continuing mission to provide you with exceptional heart care, our providers are all part of one team.  This team includes your primary Cardiologist (physician) and Advanced Practice Providers or APPs (Physician Assistants and Nurse Practitioners) who all work together to provide you with the care you need, when you need it.  Your next appointment:  2 weeks after cath    Wed 10/29 at 1:55 pm    Provider: Damien Braver NP   [place] [facility] A DEPT OF [facility]CARE AT [address] A DEPT OF THE [place] [address] [place] [address] Dept: [phone] Loc: [phone]  Allen Gould  02/29/2024  You are scheduled for a Cardiac Catheterization on Wednesday, October 15 with Dr. Lurena Red.  1. Please arrive at the [facility] (Main Entrance A) at [facility]: [address] [place], [place] [address] at 11:00 AM (This time is 2 hour(s) before your procedure to ensure your preparation).   Free valet parking service is available. You will check in at ADMITTING. The support person will be asked to wait in the waiting room.  It is OK to have someone drop you off and come back when you are ready to be discharged.    Special note: Every effort is made to have your procedure done on time. Please understand that emergencies sometimes delay scheduled procedures.  2. Diet: Nothing to eat  after midnight.   3. Hydration: You need to be well hydrated before your procedure. On October 15, you may drink approved liquids (see below) until 2 hours before the procedure, with 16 oz of water as your last intake.   List of approved liquids water, clear juice, clear tea, black coffee, fruit juices, non-citric and without pulp, carbonated beverages, Gatorade, Kool -Aid, plain Jello-O and plain ice popsicles.  4. Labs: You will need to have blood drawn on today.  5. Medication instructions in preparation for your procedure:   Contrast Allergy: Yes, Please take Prednisone  50mg  by mouth at: Thirteen hours prior to cath  Seven hours prior to cath  And prior to leaving home please take last dose of Prednisone  50mg  and Benadryl 50mg  by mouth.   Hold Eliquis  2 days before Cath   Take 1/2 dose of insulin  night before cath and Hold morning dose day of cath  Hold Lasix  morning of cath   On the morning of your procedure, take your Aspirin  81 mg and any morning medicines NOT listed above.  You may use sips of water.  6. Plan to go home the same day, you will only stay overnight if medically necessary. 7. Bring a current list of your medications and current insurance cards. 8. You MUST have a responsible person to drive you home. 9. Someone MUST be with you the first 24 hours after you arrive home or your discharge will be delayed. 10.  Please wear clothes that are easy to get on and off and wear slip-on shoes.  Thank you for allowing us  to care for you!   -- [facility]      We recommend signing up for the patient portal called MyChart.  Sign up information is provided on this After Visit Summary.  MyChart is used to connect with patients for Virtual Visits (Telemedicine).  Patients are able to view lab/test results, encounter notes, upcoming appointments, etc.  Non-urgent messages can be sent to your provider as well.   To learn more about what you  can do with MyChart, go to [email or web address].

## 2024-03-01 LAB — BASIC METABOLIC PANEL WITH GFR
BUN/Creatinine Ratio: 10 (ref 10–24)
BUN: 15 mg/dL (ref 8–27)
CO2: 22 mmol/L (ref 20–29)
Calcium: 10 mg/dL (ref 8.6–10.2)
Chloride: 98 mmol/L (ref 96–106)
Creatinine, Ser: 1.51 mg/dL — ABNORMAL HIGH (ref 0.76–1.27)
Glucose: 137 mg/dL — ABNORMAL HIGH (ref 70–99)
Potassium: 4.7 mmol/L (ref 3.5–5.2)
Sodium: 141 mmol/L (ref 134–144)
eGFR: 53 mL/min/1.73 — ABNORMAL LOW (ref >59)

## 2024-03-01 LAB — CBC WITH DIFFERENTIAL/PLATELET
Basophils Absolute: 0.1 x10E3/uL (ref 0.0–0.2)
Basos: 0 %
EOS (ABSOLUTE): 0.5 x10E3/uL — ABNORMAL HIGH (ref 0.0–0.4)
Eos: 4 %
Hematocrit: 39.4 % (ref 37.5–51.0)
Hemoglobin: 12.8 g/dL — ABNORMAL LOW (ref 13.0–17.7)
Immature Grans (Abs): 0 x10E3/uL (ref 0.0–0.1)
Immature Granulocytes: 0 %
Lymphocytes Absolute: 2.3 x10E3/uL (ref 0.7–3.1)
Lymphs: 18 %
MCH: 32.2 pg (ref 26.6–33.0)
MCHC: 32.5 g/dL (ref 31.5–35.7)
MCV: 99 fL — ABNORMAL HIGH (ref 79–97)
Monocytes Absolute: 1.2 x10E3/uL — ABNORMAL HIGH (ref 0.1–0.9)
Monocytes: 9 %
Neutrophils Absolute: 8.4 x10E3/uL — ABNORMAL HIGH (ref 1.4–7.0)
Neutrophils: 69 %
Platelets: 253 x10E3/uL (ref 150–450)
RBC: 3.97 x10E6/uL — ABNORMAL LOW (ref 4.14–5.80)
RDW: 12.6 % (ref 11.6–15.4)
WBC: 12.5 x10E3/uL — ABNORMAL HIGH (ref 3.4–10.8)

## 2024-03-02 ENCOUNTER — Ambulatory Visit: Payer: Self-pay | Admitting: Cardiology

## 2024-03-05 LAB — BASIC METABOLIC PANEL WITH GFR
BUN/Creatinine Ratio: 16 (ref 10–24)
BUN: 23 mg/dL (ref 8–27)
CO2: 21 mmol/L (ref 20–29)
Calcium: 10 mg/dL (ref 8.6–10.2)
Chloride: 98 mmol/L (ref 96–106)
Creatinine, Ser: 1.44 mg/dL — ABNORMAL HIGH (ref 0.76–1.27)
Glucose: 257 mg/dL — ABNORMAL HIGH (ref 70–99)
Potassium: 4.2 mmol/L (ref 3.5–5.2)
Sodium: 138 mmol/L (ref 134–144)
eGFR: 56 mL/min/1.73 — ABNORMAL LOW (ref >59)

## 2024-03-05 NOTE — Progress Notes (Unsigned)
 03/06/2024 4:08 PM   [name] [birth year] [id number]  Referring provider: Leontine Phebe Charlotte ONEIDA, NP [address] [place],  [place] [address]  CC: Frequent UTI   HPI: 58 year old male with multiple medical conditions including obesity, diabetes, coronary artery disease, PAF, OSA, hypertension, LVH comes in today referred for management of lower urinary tract symptoms (frequency, urgency, occasional urgency incontinence, nocturia) as well as frequent urinary tract infections.  Patient has a good urinary stream.  Severely limited mobility, he is in wheelchair.  He does have symptoms-what he calls burning and smelly urine-when he has UTIs.  I have no recent evidence of UTIs, however.  He does drink 4 cups of coffee a day.   PMH: Past Medical History:  Diagnosis Date   Acquired dilation of ascending aorta and aortic root    42mm by echo 09/2020 but normal on Chest CTA 05/2020.  Stable by 2D echo 11/2023 with ascending aorta 4.2 cm which is normal when indexed for body surface area   Acute kidney injury 09/01/2019   Asthma    Balanitis 08/09/2014   Bifascicular block    CAD (coronary artery disease), native coronary artery    Coronary CTA showed a coronary calcium  score of 1246 with 50 to 69% prox/mid LAD, 25 to 49% distal LAD, 25 to 49% prox/mid D2/LCx, 50 to 69% prox/mid OM1, 25 to 49% prox/mid RCA, <25% distal RCA/PDA, 25 to 49% PLA.   Cough 08/09/2014   Diabetes mellitus without complication (HCC)    Dilated aortic root    42mm on echo 09/2020   DM (diabetes mellitus) type 2, uncontrolled, without ketoacidosis 08/10/2013   Dyslipidemia 03/01/2014   Financial difficulties 08/09/2014   GERD (gastroesophageal reflux disease)    Health care maintenance 03/01/2014   HOCM (hypertrophic obstructive cardiomyopathy) (HCC)    noted on cMRI but no SAM or LVOT gradient   Hypertension    Hypomagnesemia 05/29/2017   Insomnia 08/29/2013   Mitral stenosis    mean MVG   Morbid  obesity (HCC) 08/29/2013   Near syncope 05/29/2017   Odontogenic infection of jaw 05/29/2017   PAC (premature atrial contraction)    PAF (paroxysmal atrial fibrillation) (HCC)    Pressure injury of skin 09/02/2019   PVCs (premature ventricular contractions)    Right shoulder pain 08/29/2013   Second degree AV block, Mobitz type I    Sepsis (HCC) 05/29/2017   Sleep apnea    uses cpap    Surgical History: Past Surgical History:  Procedure Laterality Date   APPENDECTOMY     MASS EXCISION N/A 08/19/2020   Procedure: EXCISION OF SCROTAL CYSTS;  Surgeon: Elisabeth Valli BIRCH, MD;  Location: [facility];  Service: Urology;  Laterality: N/A;  1 HR   PACEMAKER IMPLANT N/A 06/24/2020   Procedure: PACEMAKER IMPLANT;  Surgeon: Cindie Ole ONEIDA, MD;  Location: [facility] INVASIVE CV LAB;  Service: Cardiovascular;  Laterality: N/A;    Home Medications:  Allergies as of 03/06/2024       Reactions   Shellfish Allergy Hives   Shellfish Protein-containing Drug Products Hives        Medication List        Accurate as of March 05, 2024  4:08 PM. If you have any questions, ask your nurse or doctor.          Accu-Chek Guide test strip Generic drug: glucose blood Check blood sugar 3 times a day   Accu-Chek Softclix Lancets lancets Check blood sugar 3 times  a day   acetaminophen  500 MG tablet Commonly known as: TYLENOL  Take 1,000 mg by mouth 3 (three) times daily.   albuterol  108 (90 Base) MCG/ACT inhaler Commonly known as: VENTOLIN  HFA Inhale 2 puffs into the lungs every 6 (six) hours as needed for shortness of breath or wheezing.   amLODipine  10 MG tablet Commonly known as: NORVASC  Take 1 tablet (10 mg total) by mouth daily.   apixaban  5 MG Tabs tablet Commonly known as: ELIQUIS  Take 1 tablet (5 mg total) by mouth 2 (two) times daily.   aspirin  81 MG chewable tablet Chew 81 mg by mouth daily.   atorvastatin  80 MG tablet Commonly known as: LIPITOR  TAKE 1 TABLET (80 MG TOTAL) BY  MOUTH DAILY. What changed:  how much to take when to take this   bacitracin  500 UNIT/GM ointment Apply 500 Applications topically 2 (two) times daily as needed (Apply to scrotum as needed for skin irritation).   Cholecalciferol 50 MCG (2000 UT) Tabs Take 2,000 Units by mouth daily.   diphenhydrAMINE 50 MG tablet Commonly known as: BENADRYL Take 50 mg before leaves home for cardiac cath   Dulaglutide  3 MG/0.5ML Soaj Inject 0.5 mLs into the skin once a week.   DULoxetine  60 MG capsule Commonly known as: CYMBALTA  Take 2 capsules (120 mg total) by mouth daily. What changed: how much to take   furosemide  40 MG tablet Commonly known as: LASIX  Take 2 tablets ( 80 mg ) every morning for 3 mornings then decrease and take 1 tablet (40 mg ) every morning.   gabapentin  300 MG capsule Commonly known as: NEURONTIN  TAKE 1 CAPSULE (300 MG TOTAL) BY MOUTH IN THE MORNING, AT NOON, AND AT BEDTIME.   guaiFENesin 100 MG/5ML liquid Commonly known as: ROBITUSSIN Take 10 mLs by mouth 4 (four) times daily as needed for cough.   HumaLOG KwikPen 100 UNIT/ML KwikPen Generic drug: insulin  lispro Inject 11 Units into the skin 3 (three) times daily. Hold of BS is less than 100 and or PT is not eating   insulin  glargine-yfgn 100 UNIT/ML injection Commonly known as: SEMGLEE  Inject 0.3 mLs (30 Units total) into the skin daily. What changed:  how much to take when to take this additional instructions   loratadine 10 MG tablet Commonly known as: CLARITIN Take 10 mg by mouth daily.   methocarbamol  500 MG tablet Commonly known as: ROBAXIN  Take 1 tablet (500 mg total) by mouth 2 (two) times daily.   metoprolol  succinate 25 MG 24 hr tablet Commonly known as: Toprol  XL Take 1 tablet (25 mg total) by mouth at bedtime.   metoprolol  tartrate 100 MG tablet Commonly known as: LOPRESSOR  Take 1 tablet (100 mg total) by mouth once for 1 dose.   nystatin  powder Commonly known as:  MYCOSTATIN /NYSTOP  Apply 1 Application topically 3 (three) times daily.   pantoprazole  40 MG tablet Commonly known as: PROTONIX  TAKE 1 TABLET (40 MG TOTAL) BY MOUTH DAILY.   polyethylene glycol 17 g packet Commonly known as: MIRALAX  / GLYCOLAX  Take 17 g by mouth daily as needed for mild constipation, moderate constipation or severe constipation.   predniSONE  50 MG tablet Commonly known as: DELTASONE  Take 50 mg 13 hours before cardiac cath   Take 50 mg 7 hours before cardiac cath    Take 50 mg before leaves home for cardiac cath   Rexulti 2 MG Tabs tablet Generic drug: brexpiprazole Take 2 mg by mouth daily.        Allergies:  Allergies  Allergen Reactions   Shellfish Allergy Hives   Shellfish Protein-Containing Drug Products Hives    Family History: Family History  Problem Relation Age of Onset   Kidney disease Mother    Diabetes Sister    Hyperlipidemia Sister    Heart disease Other     Social History:  reports that he quit smoking about 18 years ago. His smoking use included cigarettes. He has never used smokeless tobacco. He reports that he does not currently use alcohol after a past usage of about 2.0 standard drinks of alcohol per week. He reports that he does not use drugs.  ROS: All other review of systems were reviewed and are negative except what is noted above in HPI  Physical Exam: There were no vitals taken for this visit.  Constitutional:  Alert and oriented, No acute distress.  Morbidly obese.  In wheelchair. HEENT: [place] AT, moist mucus membranes.  Trachea midline, no masses. Cardiovascular: No clubbing, cyanosis, or edema. Respiratory: Normal respiratory effort, no increased work of breathing. GI: No inguinal hernias.  Abdomen morbidly obese. GU: All is buried, uncircumcised.  No phimosis.  No penile or scrotal skin lesions.  Testicles normal. Lymph: No cervical or inguinal lymphadenopathy. Skin: No rashes, bruises or suspicious lesions. Neurologic:  Grossly intact, no focal deficits, moving all 4 extremities. Psychiatric: Normal mood and affect.  Laboratory Data: Lab Results  Component Value Date   WBC 12.5 (H) 03/01/2024   HGB 12.8 (L) 03/01/2024   HCT 39.4 03/01/2024   MCV 99 (H) 03/01/2024   PLT 253 03/01/2024    Lab Results  Component Value Date   CREATININE 1.51 (H) 03/01/2024    No results found for: PSA  No results found for: TESTOSTERONE  Lab Results  Component Value Date   HGBA1C >15.5 (H) 10/23/2022    Urinalysis Clear today  Pertinent Imaging: CT stone protocol from 2022 reviewed  Bladder scan volume negligible  Nursing home notes reviewed Assessment:  Lower urinary tract symptoms-reminiscent of overactive bladder.  He empties well.  History of recurrent urinary tract infections urinalysis was clear today Plan:   Regarding OAB symptoms, we will start him on solifenacin 10 mg 1 p.o. daily long-term  I do not think we need to put him on any long-term therapy for recurrent urinary tract infections  Office visit back here in about 6 months There are no diagnoses linked to this encounter.  No follow-ups on file.  Garnette CHRISTELLA Shack, MD  [facility]

## 2024-03-06 ENCOUNTER — Ambulatory Visit (INDEPENDENT_AMBULATORY_CARE_PROVIDER_SITE_OTHER): Admitting: Urology

## 2024-03-06 ENCOUNTER — Telehealth: Payer: Self-pay | Admitting: *Deleted

## 2024-03-06 VITALS — BP 132/88 | HR 75

## 2024-03-06 DIAGNOSIS — Z87440 Personal history of urinary (tract) infections: Secondary | ICD-10-CM

## 2024-03-06 DIAGNOSIS — R3915 Urgency of urination: Secondary | ICD-10-CM

## 2024-03-06 DIAGNOSIS — N3941 Urge incontinence: Secondary | ICD-10-CM | POA: Diagnosis not present

## 2024-03-06 DIAGNOSIS — R350 Frequency of micturition: Secondary | ICD-10-CM | POA: Diagnosis not present

## 2024-03-06 DIAGNOSIS — R351 Nocturia: Secondary | ICD-10-CM

## 2024-03-06 DIAGNOSIS — N390 Urinary tract infection, site not specified: Secondary | ICD-10-CM

## 2024-03-06 LAB — URINALYSIS, ROUTINE W REFLEX MICROSCOPIC
Bilirubin, UA: NEGATIVE
Glucose, UA: NEGATIVE
Ketones, UA: NEGATIVE
Leukocytes,UA: NEGATIVE
Nitrite, UA: NEGATIVE
RBC, UA: NEGATIVE
Specific Gravity, UA: 1.015 (ref 1.005–1.030)
Urobilinogen, Ur: 0.2 mg/dL (ref 0.2–1.0)
pH, UA: 6 (ref 5.0–7.5)

## 2024-03-06 LAB — MICROSCOPIC EXAMINATION
Bacteria, UA: NONE SEEN
RBC, Urine: NONE SEEN /HPF (ref 0–2)

## 2024-03-06 LAB — BLADDER SCAN AMB NON-IMAGING: Scan Result: 0

## 2024-03-06 NOTE — Telephone Encounter (Signed)
 Cardiac Catheterization scheduled at [facility] for: Wednesday March 07, 2024 1 PM Arrival time [facility] Main Entrance A at: 11 AM  Diet: -May have light meal until 7 AM. (6 hours before procedure time) Approved light meal consists of plain toast, fruit, light soups, crackers.  Hydration: -May drink clear liquids until 2 hours before the procedure.  Approved liquids: Water, clear tea, black coffee, fruit juices-non-citric and without pulp,Gatorade, plain Jello/popsicles.   -Please drink 16 oz of water 2 hours before procedure.  Medication instructions -Hold:  Eliquis -none 03/05/24 until post procedure  Insulin -AM of procedure/1/2 usual dose HS prior to procedure  Lasix -AM of procedure -Other usual morning medications can be taken including aspirin  81 mg.  Pt tells me Dulaglutide  is weekly on Thursdays.  Plan to go home the same day, you will only stay overnight if medically necessary.  You must have responsible adult to drive you home.  Someone must be with you the first 24 hours after you arrive home.  Reviewed procedure instructions with patient. Patient tells me he does not recall reaction to IV contrast in the past.

## 2024-03-06 NOTE — Progress Notes (Signed)
   Patient cannot void prior to the bladder scan. Bladder scan result: 0  Performed By: [facility] LPN

## 2024-03-07 ENCOUNTER — Encounter (HOSPITAL_COMMUNITY)
Admission: RE | Disposition: A | Discharge status: Home / Self Care | Payer: Self-pay | Source: Home / Self Care | Attending: Internal Medicine

## 2024-03-07 ENCOUNTER — Ambulatory Visit (HOSPITAL_COMMUNITY)
Admission: RE | Admit: 2024-03-07 | Discharge: 2024-03-07 | Disposition: A | Discharge status: Home / Self Care | Attending: Internal Medicine | Admitting: Internal Medicine

## 2024-03-07 ENCOUNTER — Other Ambulatory Visit: Payer: Self-pay

## 2024-03-07 ENCOUNTER — Encounter (HOSPITAL_COMMUNITY): Payer: Self-pay | Admitting: Internal Medicine

## 2024-03-07 DIAGNOSIS — I5032 Chronic diastolic (congestive) heart failure: Secondary | ICD-10-CM | POA: Diagnosis not present

## 2024-03-07 DIAGNOSIS — Z7901 Long term (current) use of anticoagulants: Secondary | ICD-10-CM | POA: Diagnosis not present

## 2024-03-07 DIAGNOSIS — E785 Hyperlipidemia, unspecified: Secondary | ICD-10-CM | POA: Diagnosis not present

## 2024-03-07 DIAGNOSIS — Z79899 Other long term (current) drug therapy: Secondary | ICD-10-CM | POA: Insufficient documentation

## 2024-03-07 DIAGNOSIS — I2510 Atherosclerotic heart disease of native coronary artery without angina pectoris: Secondary | ICD-10-CM | POA: Diagnosis not present

## 2024-03-07 DIAGNOSIS — Z794 Long term (current) use of insulin: Secondary | ICD-10-CM | POA: Insufficient documentation

## 2024-03-07 DIAGNOSIS — I480 Paroxysmal atrial fibrillation: Secondary | ICD-10-CM | POA: Insufficient documentation

## 2024-03-07 DIAGNOSIS — Z59868 Other specified financial insecurity: Secondary | ICD-10-CM | POA: Diagnosis not present

## 2024-03-07 DIAGNOSIS — E119 Type 2 diabetes mellitus without complications: Secondary | ICD-10-CM | POA: Insufficient documentation

## 2024-03-07 DIAGNOSIS — I110 Hypertensive heart disease with heart failure: Secondary | ICD-10-CM | POA: Diagnosis not present

## 2024-03-07 DIAGNOSIS — Z950 Presence of cardiac pacemaker: Secondary | ICD-10-CM | POA: Diagnosis not present

## 2024-03-07 DIAGNOSIS — Z5941 Food insecurity: Secondary | ICD-10-CM | POA: Insufficient documentation

## 2024-03-07 DIAGNOSIS — G4733 Obstructive sleep apnea (adult) (pediatric): Secondary | ICD-10-CM | POA: Insufficient documentation

## 2024-03-07 DIAGNOSIS — Z87891 Personal history of nicotine dependence: Secondary | ICD-10-CM | POA: Insufficient documentation

## 2024-03-07 DIAGNOSIS — R931 Abnormal findings on diagnostic imaging of heart and coronary circulation: Secondary | ICD-10-CM | POA: Diagnosis present

## 2024-03-07 DIAGNOSIS — I25118 Atherosclerotic heart disease of native coronary artery with other forms of angina pectoris: Secondary | ICD-10-CM

## 2024-03-07 DIAGNOSIS — Z7985 Long-term (current) use of injectable non-insulin antidiabetic drugs: Secondary | ICD-10-CM | POA: Diagnosis not present

## 2024-03-07 DIAGNOSIS — I25119 Atherosclerotic heart disease of native coronary artery with unspecified angina pectoris: Secondary | ICD-10-CM

## 2024-03-07 HISTORY — PX: RIGHT/LEFT HEART CATH AND CORONARY ANGIOGRAPHY: CATH118266

## 2024-03-07 LAB — POCT I-STAT EG7
Acid-Base Excess: 0 mmol/L (ref 0.0–2.0)
Acid-Base Excess: 0 mmol/L (ref 0.0–2.0)
Bicarbonate: 25 mmol/L (ref 20.0–28.0)
Bicarbonate: 25.8 mmol/L (ref 20.0–28.0)
Calcium, Ion: 1.13 mmol/L — ABNORMAL LOW (ref 1.15–1.40)
Calcium, Ion: 1.24 mmol/L (ref 1.15–1.40)
HCT: 41 % (ref 39.0–52.0)
HCT: 41 % (ref 39.0–52.0)
Hemoglobin: 13.9 g/dL (ref 13.0–17.0)
Hemoglobin: 13.9 g/dL (ref 13.0–17.0)
O2 Saturation: 64 %
O2 Saturation: 71 %
Potassium: 4.9 mmol/L (ref 3.5–5.1)
Potassium: 5.1 mmol/L (ref 3.5–5.1)
Sodium: 136 mmol/L (ref 135–145)
Sodium: 138 mmol/L (ref 135–145)
TCO2: 26 mmol/L (ref 22–32)
TCO2: 27 mmol/L (ref 22–32)
pCO2, Ven: 43.1 mmHg — ABNORMAL LOW (ref 44–60)
pCO2, Ven: 44.9 mmHg (ref 44–60)
pH, Ven: 7.368 (ref 7.25–7.43)
pH, Ven: 7.372 (ref 7.25–7.43)
pO2, Ven: 34 mmHg (ref 32–45)
pO2, Ven: 39 mmHg (ref 32–45)

## 2024-03-07 LAB — POCT I-STAT 7, (LYTES, BLD GAS, ICA,H+H)
Acid-base deficit: 1 mmol/L (ref 0.0–2.0)
Bicarbonate: 23.3 mmol/L (ref 20.0–28.0)
Calcium, Ion: 1.19 mmol/L (ref 1.15–1.40)
HCT: 41 % (ref 39.0–52.0)
Hemoglobin: 13.9 g/dL (ref 13.0–17.0)
O2 Saturation: 94 %
Potassium: 5 mmol/L (ref 3.5–5.1)
Sodium: 137 mmol/L (ref 135–145)
TCO2: 24 mmol/L (ref 22–32)
pCO2 arterial: 37.3 mmHg (ref 32–48)
pH, Arterial: 7.403 (ref 7.35–7.45)
pO2, Arterial: 72 mmHg — ABNORMAL LOW (ref 83–108)

## 2024-03-07 LAB — GLUCOSE, CAPILLARY
Glucose-Capillary: 277 mg/dL — ABNORMAL HIGH (ref 70–99)
Glucose-Capillary: 269 mg/dL — ABNORMAL HIGH (ref 70–99)

## 2024-03-07 SURGERY — RIGHT/LEFT HEART CATH AND CORONARY ANGIOGRAPHY
Anesthesia: LOCAL

## 2024-03-07 MED ORDER — FENTANYL CITRATE (PF) 100 MCG/2ML IJ SOLN
INTRAMUSCULAR | Status: DC | PRN
  Administered 2024-03-07: 25 ug via INTRAVENOUS

## 2024-03-07 MED ORDER — FREE WATER: 500.0000 mL | Freq: Once | Status: DC

## 2024-03-07 MED ORDER — ACETAMINOPHEN 325 MG PO TABS: 650.0000 mg | ORAL_TABLET | ORAL | Status: DC | PRN

## 2024-03-07 MED ORDER — HEPARIN (PORCINE) IN NACL 1000-0.9 UT/500ML-% IV SOLN
INTRAVENOUS | Status: DC | PRN
  Administered 2024-03-07 (×2): 500 mL

## 2024-03-07 MED ORDER — ASPIRIN 81 MG PO CHEW: 81.0000 mg | CHEWABLE_TABLET | ORAL | Status: DC

## 2024-03-07 MED ORDER — MIDAZOLAM HCL 2 MG/2ML IJ SOLN
INTRAMUSCULAR | Status: AC
  Filled 2024-03-07: qty 2

## 2024-03-07 MED ORDER — LABETALOL HCL 5 MG/ML IV SOLN: 10.0000 mg | INTRAVENOUS | Status: DC | PRN

## 2024-03-07 MED ORDER — MIDAZOLAM HCL 2 MG/2ML IJ SOLN
INTRAMUSCULAR | Status: DC | PRN
  Administered 2024-03-07: 1 mg via INTRAVENOUS

## 2024-03-07 MED ORDER — HEPARIN SODIUM (PORCINE) 1000 UNIT/ML IJ SOLN
INTRAMUSCULAR | Status: DC | PRN
  Administered 2024-03-07: 5000 [IU] via INTRA_ARTERIAL

## 2024-03-07 MED ORDER — HEPARIN SODIUM (PORCINE) 1000 UNIT/ML IJ SOLN
INTRAMUSCULAR | Status: AC
  Filled 2024-03-07: qty 10

## 2024-03-07 MED ORDER — FENTANYL CITRATE (PF) 100 MCG/2ML IJ SOLN
INTRAMUSCULAR | Status: AC
  Filled 2024-03-07: qty 2

## 2024-03-07 MED ORDER — HYDRALAZINE HCL 20 MG/ML IJ SOLN: 10.0000 mg | INTRAMUSCULAR | Status: DC | PRN

## 2024-03-07 MED ORDER — LIDOCAINE HCL (PF) 1 % IJ SOLN
INTRAMUSCULAR | Status: DC | PRN
  Administered 2024-03-07 (×2): 2 mL via INTRADERMAL

## 2024-03-07 MED ORDER — SODIUM CHLORIDE 0.9% FLUSH: 3.0000 mL | INTRAVENOUS | Status: DC | PRN

## 2024-03-07 MED ORDER — LIDOCAINE HCL (PF) 1 % IJ SOLN
INTRAMUSCULAR | Status: AC
  Filled 2024-03-07: qty 30

## 2024-03-07 MED ORDER — SODIUM CHLORIDE 0.9% FLUSH: 3.0000 mL | Freq: Two times a day (BID) | INTRAVENOUS | Status: DC

## 2024-03-07 MED ORDER — VERAPAMIL HCL 2.5 MG/ML IV SOLN
INTRAVENOUS | Status: AC
  Filled 2024-03-07: qty 2

## 2024-03-07 MED ORDER — SODIUM CHLORIDE 0.9% FLUSH
3.0000 mL | INTRAVENOUS | Status: DC | PRN
Start: 2024-03-07 — End: 2024-03-07

## 2024-03-07 MED ORDER — SODIUM CHLORIDE 0.9 % IV SOLN: 250.0000 mL | INTRAVENOUS | Status: DC | PRN

## 2024-03-07 MED ORDER — IOHEXOL 350 MG/ML SOLN
INTRAVENOUS | Status: DC | PRN
  Administered 2024-03-07: 80 mL

## 2024-03-07 MED ORDER — HEPARIN (PORCINE) IN NACL 2-0.9 UNITS/ML
INTRAMUSCULAR | Status: DC | PRN
  Administered 2024-03-07: 10 mL via INTRA_ARTERIAL

## 2024-03-07 MED ORDER — ONDANSETRON HCL 4 MG/2ML IJ SOLN: 4.0000 mg | Freq: Four times a day (QID) | INTRAMUSCULAR | Status: DC | PRN

## 2024-03-07 SURGICAL SUPPLY — 12 items
CATH BALLN WEDGE 5F 110CM (CATHETERS) IMPLANT
CATH INFINITI 5FR ANG PIGTAIL (CATHETERS) IMPLANT
CATH INFINITI 6F FL3.5 (CATHETERS) IMPLANT
CATH INFINITI AMBI 6FR TG (CATHETERS) IMPLANT
DEVICE RAD COMP TR BAND LRG (VASCULAR PRODUCTS) IMPLANT
GLIDESHEATH SLEND SS 6F .021 (SHEATH) IMPLANT
GUIDEWIRE .025 260CM (WIRE) IMPLANT
KIT SYRINGE INJ CVI SPIKEX1 (MISCELLANEOUS) IMPLANT
PACK CARDIAC CATHETERIZATION (CUSTOM PROCEDURE TRAY) ×1 IMPLANT
SET ATX-X65L (MISCELLANEOUS) IMPLANT
SHEATH GLIDE SLENDER 4/5FR (SHEATH) IMPLANT
WIRE EMERALD 3MM-J .035X260CM (WIRE) IMPLANT

## 2024-03-07 NOTE — Interval H&P Note (Signed)
 History and Physical Interval Note:  03/07/2024 11:55 AM  Allen Gould  has presented today for surgery, with the diagnosis of abnormal cta.  The various methods of treatment have been discussed with the patient and family. After consideration of risks, benefits and other options for treatment, the patient has consented to  Procedure(s): RIGHT/LEFT HEART CATH AND CORONARY ANGIOGRAPHY (N/A) as a surgical intervention.  The patient's history has been reviewed, patient examined, no change in status, stable for surgery.  I have reviewed the patient's chart and labs.  Questions were answered to the patient's satisfaction.     Shon Indelicato K Ashiah Karpowicz

## 2024-03-07 NOTE — Progress Notes (Signed)
 Patient given discharge instructions, education provided no further questions at this time. Patient able to ambulate and void before discharge. Able to tolerate PO intake. Patient site is clean, dry, intact and soft upon discharge.

## 2024-03-20 ENCOUNTER — Ambulatory Visit: Attending: Cardiology

## 2024-03-20 LAB — CUP PACEART REMOTE DEVICE CHECK
Battery Remaining Longevity: 71 mo
Battery Remaining Percentage: 62 %
Battery Voltage: 3.01 V
Brady Statistic AP VP Percent: 30 %
Brady Statistic AP VS Percent: 1 %
Brady Statistic AS VP Percent: 70 %
Brady Statistic AS VS Percent: 1 %
Brady Statistic RA Percent Paced: 29 %
Brady Statistic RV Percent Paced: 99 %
Date Time Interrogation Session: 20251028040014
Implantable Lead Connection Status: 753985
Implantable Lead Connection Status: 753985
Implantable Lead Implant Date: 20220201
Implantable Lead Implant Date: 20220201
Implantable Lead Location: 753859
Implantable Lead Location: 753860
Implantable Pulse Generator Implant Date: 20220201
Lead Channel Impedance Value: 360 Ohm
Lead Channel Impedance Value: 430 Ohm
Lead Channel Pacing Threshold Amplitude: 0.5 V
Lead Channel Pacing Threshold Amplitude: 1 V
Lead Channel Pacing Threshold Pulse Width: 0.4 ms
Lead Channel Pacing Threshold Pulse Width: 0.4 ms
Lead Channel Sensing Intrinsic Amplitude: 12 mV
Lead Channel Sensing Intrinsic Amplitude: 3.7 mV
Lead Channel Setting Pacing Amplitude: 1.25 V
Lead Channel Setting Pacing Amplitude: 2 V
Lead Channel Setting Pacing Pulse Width: 0.4 ms
Lead Channel Setting Sensing Sensitivity: 2 mV
Pulse Gen Model: 2272
Pulse Gen Serial Number: 3895834

## 2024-03-21 ENCOUNTER — Ambulatory Visit: Payer: Self-pay | Admitting: Cardiology

## 2024-03-21 ENCOUNTER — Ambulatory Visit: Admitting: Nurse Practitioner

## 2024-03-26 ENCOUNTER — Encounter: Payer: Self-pay | Admitting: Radiology

## 2024-04-04 ENCOUNTER — Ambulatory Visit: Attending: Nurse Practitioner | Admitting: Nurse Practitioner

## 2024-04-04 ENCOUNTER — Encounter: Payer: Self-pay | Admitting: Nurse Practitioner

## 2024-04-04 VITALS — BP 132/80 | HR 77 | Ht 70.0 in | Wt 367.8 lb

## 2024-04-04 DIAGNOSIS — E118 Type 2 diabetes mellitus with unspecified complications: Secondary | ICD-10-CM

## 2024-04-04 DIAGNOSIS — I77810 Thoracic aortic ectasia: Secondary | ICD-10-CM

## 2024-04-04 DIAGNOSIS — I110 Hypertensive heart disease with heart failure: Secondary | ICD-10-CM | POA: Diagnosis not present

## 2024-04-04 DIAGNOSIS — I25119 Atherosclerotic heart disease of native coronary artery with unspecified angina pectoris: Secondary | ICD-10-CM

## 2024-04-04 DIAGNOSIS — I495 Sick sinus syndrome: Secondary | ICD-10-CM

## 2024-04-04 DIAGNOSIS — I10 Essential (primary) hypertension: Secondary | ICD-10-CM

## 2024-04-04 DIAGNOSIS — N1831 Chronic kidney disease, stage 3a: Secondary | ICD-10-CM

## 2024-04-04 DIAGNOSIS — E785 Hyperlipidemia, unspecified: Secondary | ICD-10-CM

## 2024-04-04 DIAGNOSIS — Z794 Long term (current) use of insulin: Secondary | ICD-10-CM

## 2024-04-04 DIAGNOSIS — G4733 Obstructive sleep apnea (adult) (pediatric): Secondary | ICD-10-CM

## 2024-04-04 DIAGNOSIS — Z950 Presence of cardiac pacemaker: Secondary | ICD-10-CM

## 2024-04-04 DIAGNOSIS — I480 Paroxysmal atrial fibrillation: Secondary | ICD-10-CM | POA: Diagnosis not present

## 2024-04-04 NOTE — Patient Instructions (Signed)
 Medication Instructions:  Your physician recommends that you continue on your current medications as directed. Please refer to the Current Medication list given to you today.  *If you need a refill on your cardiac medications before your next appointment, please call your pharmacy*  Lab Work: NONE ordered at this time of appointment   Testing/Procedures: NONE ordered at this time of appointment   Follow-Up: At [facility], you and your health needs are our priority.  As part of our continuing mission to provide you with exceptional heart care, our providers are all part of one team.  This team includes your primary Cardiologist (physician) and Advanced Practice Providers or APPs (Physician Assistants and Nurse Practitioners) who all work together to provide you with the care you need, when you need it.  Your next appointment:   3 month(s)  Provider:   [name], MD    We recommend signing up for the patient portal called MyChart.  Sign up information is provided on this After Visit Summary.  MyChart is used to connect with patients for Virtual Visits (Telemedicine).  Patients are able to view lab/test results, encounter notes, upcoming appointments, etc.  Non-urgent messages can be sent to your provider as well.   To learn more about what you can do with MyChart, go to [email or web address].

## 2024-04-04 NOTE — Progress Notes (Signed)
 Office Visit    Patient Name: Allen Gould Date of Encounter: 04/04/2024  Primary Care Provider:  Leontine Phebe Charlotte ONEIDA, NP Primary Cardiologist:  Allen Bihari, MD  Chief Complaint    58 year old male with a history of nonobstructive CAD, LVH, chronic HFpEF, paroxysmal atrial fibrillation on Eliquis , sick sinus syndrome s/p PPM in 2022, RBBB, LAFB, hypertension, hyperlipidemia, type 2 diabetes, CKD, hepatic steatosis, OSA not on CPAP, and former ETOH use, who presents for follow-up related to CAD post cardiac catheterization.  Past Medical History    Past Medical History:  Diagnosis Date   Acquired dilation of ascending aorta and aortic root    42mm by echo 09/2020 but normal on Chest CTA 05/2020.  Stable by 2D echo 11/2023 with ascending aorta 4.2 cm which is normal when indexed for body surface area   Acute kidney injury 09/01/2019   Asthma    Balanitis 08/09/2014   Bifascicular block    CAD (coronary artery disease), native coronary artery    Coronary CTA showed a coronary calcium  score of 1246 with 50 to 69% prox/mid LAD, 25 to 49% distal LAD, 25 to 49% prox/mid D2/LCx, 50 to 69% prox/mid OM1, 25 to 49% prox/mid RCA, <25% distal RCA/PDA, 25 to 49% PLA.   Cough 08/09/2014   Diabetes mellitus without complication (HCC)    Dilated aortic root    42mm on echo 09/2020   DM (diabetes mellitus) type 2, uncontrolled, without ketoacidosis 08/10/2013   Dyslipidemia 03/01/2014   Financial difficulties 08/09/2014   GERD (gastroesophageal reflux disease)    Health care maintenance 03/01/2014   HOCM (hypertrophic obstructive cardiomyopathy) (HCC)    noted on cMRI but no SAM or LVOT gradient   Hypertension    Hypomagnesemia 05/29/2017   Insomnia 08/29/2013   Mitral stenosis    mean MVG   Morbid obesity (HCC) 08/29/2013   Near syncope 05/29/2017   Odontogenic infection of jaw 05/29/2017   PAC (premature atrial contraction)    PAF (paroxysmal atrial fibrillation) (HCC)     Pressure injury of skin 09/02/2019   PVCs (premature ventricular contractions)    Right shoulder pain 08/29/2013   Second degree AV block, Mobitz type I    Sepsis (HCC) 05/29/2017   Sleep apnea    uses cpap   Past Surgical History:  Procedure Laterality Date   APPENDECTOMY     MASS EXCISION N/A 08/19/2020   Procedure: EXCISION OF SCROTAL CYSTS;  Surgeon: Allen Valli BIRCH, MD;  Location: [facility];  Service: Urology;  Laterality: N/A;  1 HR   PACEMAKER IMPLANT N/A 06/24/2020   Procedure: PACEMAKER IMPLANT;  Surgeon: Allen Ole ONEIDA, MD;  Location: [facility] INVASIVE CV LAB;  Service: Cardiovascular;  Laterality: N/A;   RIGHT/LEFT HEART CATH AND CORONARY ANGIOGRAPHY N/A 03/07/2024   Procedure: RIGHT/LEFT HEART CATH AND CORONARY ANGIOGRAPHY;  Surgeon: Allen Lurena POUR, MD;  Location: [facility] INVASIVE CV LAB;  Service: Cardiovascular;  Laterality: N/A;    Allergies  Allergies  Allergen Reactions   Shellfish Allergy Hives   Shellfish Protein-Containing Drug Products Hives     Labs/Other Studies Reviewed    The following studies were reviewed today:  Cardiac Studies & Procedures   ______________________________________________________________________________________________ CARDIAC CATHETERIZATION  CARDIAC CATHETERIZATION 03/07/2024  Conclusion   Prox RCA lesion is 20% stenosed.   Mid LAD lesion is 20% stenosed.  1.  Mild, nonobstructive coronary artery disease. 2.  Fick cardiac output of 9 L/min and Fick cardiac index of 3.3 L/min/m with following hemodynamics:  Right atrial pressure mean of 12 mmHg Right ventricular pressure 44/5 with an end-diastolic pressure of 14 mmHg Wedge pressure mean of 15 mmHg with V waves to 19 mmHg PA pressure of 35/23 with a mean of 28 mmHg PVR of 1.4 Woods units PA pulsatility index of 0.7 3.  LVEDP of 21 mmHg  Recommendation: Medical therapy; continue diuretic therapy given elevated right sided filling pressures and low PA pulsatility  index.  Findings Coronary Findings Diagnostic  Dominance: Right  Left Anterior Descending Mid LAD lesion is 20% stenosed.  Left Circumflex The vessel exhibits minimal luminal irregularities.  Right Coronary Artery Prox RCA lesion is 20% stenosed.  Intervention  No interventions have been documented.   STRESS TESTS  MYOCARDIAL PERFUSION IMAGING 12/26/2023  Interpretation Summary   Findings are consistent with ischemia. The study is intermediate risk.   No ST deviation was noted.   LV perfusion is abnormal. There is evidence of ischemia. Defect 1: There is a medium defect with moderate reduction in uptake present in the apical to basal anterior location(s) that is reversible. There is normal wall motion in the defect area. Consistent with ischemia.   Left ventricular function is normal. Nuclear stress EF: 60%. The left ventricular ejection fraction is normal (55-65%). End diastolic cavity size is mildly enlarged. End systolic cavity size is normal.   CT images were obtained for attenuation correction and were examined for the presence of coronary calcium  when appropriate.   Coronary calcium  was present on the attenuation correction CT images. Coronary calcifications were present in the left anterior descending artery and left circumflex artery distribution(s).   Prior study not available for comparison.  Mild to moderate perfusion defect seen in stress images in anterior wall, worst at apex. EF overall normal with septal dyskinesis noted, like due to RV pacing.   ECHOCARDIOGRAM  ECHOCARDIOGRAM COMPLETE 12/22/2023  Narrative ECHOCARDIOGRAM REPORT    Patient Name:   Allen Gould Date of Exam: 12/22/2023 Medical Rec #:  [id number]       Height:       70.0 in Accession #:    [id number]      Weight:       338.0 lb Date of Birth:  1966-04-17       BSA:          2.609 m Patient Age:    57 years        BP:           130/93 mmHg Patient Gender: M               HR:           80  bpm. Exam Location:  [address]  Procedure: 2D Echo and Intracardiac Opacification Agent (Both Spectral and Color Flow Doppler were utilized during procedure).  Indications:    I77.810 Dilation of aorta  History:        Patient has prior history of Echocardiogram examinations, most recent 05/11/2021. CHF, CAD, Pacemaker, Arrythmias:Atrial Fibrillation and RBBB, Signs/Symptoms:Shortness of Breath; Risk Factors:Hypertension, Diabetes and Sleep Apnea. Asthma. Chronic kidney disease. Hypertrophic cardiomyopathy. Tachy-brady syndrome. Morbid obesity.  Sonographer:    Jon Hacker RCS Referring Phys: [phone] TRACI R TURNER  IMPRESSIONS   1. Difficult to comment on wall motion given poor quality images, but appears hyperdynamic. Mild intercavitary gradient of with valsalva. Left ventricular ejection fraction, by estimation, is 70 to 75%. The left ventricle has hyperdynamic function. Left ventricular endocardial border not optimally defined to evaluate regional  wall motion. Left ventricular diastolic parameters are indeterminate. 2. Right ventricular systolic function was not well visualized. The right ventricular size is not well visualized. 3. The mitral valve is normal in structure. No evidence of mitral valve regurgitation. No evidence of mitral stenosis. 4. The aortic valve is normal in structure. Aortic valve regurgitation is not visualized. No aortic stenosis is present. 5. Aortic dilatation noted. There is mild dilatation of the ascending aorta, measuring 42 mm. 6. The inferior vena cava is normal in size with greater than 50% respiratory variability, suggesting right atrial pressure of 3 mmHg.  FINDINGS Left Ventricle: Difficult to comment on wall motion given poor quality images, but appears hyperdynamic. Mild intercavitary gradient of with valsalva. Left ventricular ejection fraction, by estimation, is 70 to 75%. The left ventricle has hyperdynamic function. Left  ventricular endocardial border not optimally defined to evaluate regional wall motion. The left ventricular internal cavity size was normal in size. There is no left ventricular hypertrophy. Left ventricular diastolic parameters are indeterminate.  Right Ventricle: The right ventricular size is not well visualized. Right vetricular wall thickness was not well visualized. Right ventricular systolic function was not well visualized.  Left Atrium: Left atrial size was normal in size.  Right Atrium: Right atrial size was normal in size.  Pericardium: There is no evidence of pericardial effusion.  Mitral Valve: The mitral valve is normal in structure. No evidence of mitral valve regurgitation. No evidence of mitral valve stenosis.  Tricuspid Valve: The tricuspid valve is normal in structure. Tricuspid valve regurgitation is not demonstrated. No evidence of tricuspid stenosis.  Aortic Valve: The aortic valve is normal in structure. Aortic valve regurgitation is not visualized. No aortic stenosis is present.  Pulmonic Valve: The pulmonic valve was normal in structure. Pulmonic valve regurgitation is mild. No evidence of pulmonic stenosis.  Aorta: The aortic root is normal in size and structure and aortic dilatation noted. There is mild dilatation of the ascending aorta, measuring 42 mm.  Venous: The inferior vena cava is normal in size with greater than 50% respiratory variability, suggesting right atrial pressure of 3 mmHg.  IAS/Shunts: The interatrial septum was not well visualized.   LEFT VENTRICLE PLAX 2D LVIDd:         4.45 cm   Diastology LVIDs:         2.94 cm   LV e' medial:    7.51 cm/s LV PW:         1.46 cm   LV E/e' medial:  11.5 LV IVS:        1.68 cm   LV e' lateral:   7.51 cm/s LVOT diam:     2.00 cm   LV E/e' lateral: 11.5 LV SV:         67 LV SV Index:   26 LVOT Area:     3.14 cm   RIGHT VENTRICLE RV S prime:     10.20 cm/s TAPSE (M-mode): 1.5 cm  LEFT ATRIUM          Index LA diam:    3.60 cm 1.38 cm/m AORTIC VALVE LVOT Vmax:   138.00 cm/s LVOT Vmean:  89.100 cm/s LVOT VTI:    0.212 m  AORTA Ao Root diam: 3.90 cm Ao Asc diam:  4.20 cm  MITRAL VALVE MV Area (PHT): 3.51 cm     SHUNTS MV Decel Time: 216 msec     Systemic VTI:  0.21 m MV E velocity: 86.50 cm/s   Systemic Diam:  2.00 cm MV A velocity: 104.00 cm/s MV E/A ratio:  0.83  Morene Brownie Electronically signed by Morene Brownie Signature Date/Time: 12/22/2023/4:14:17 PM    Final    MONITORS  LONG TERM MONITOR (3-14 DAYS) 05/07/2020  Narrative  Normal sinus rhythm and sinus tachycardia. The average heart rate was 77bpm while in NSR and highest HR was 111bpm.  Nonsustained wide complex tachycardia up to 6 beats.  Nonsustained wide complex tachycardia up to 6 beats.  Nonsustained atrial tachycardia up to 148bpm.  Frequent PACs.  Second degree Type I, Type 2 AV block and 3rd degree AV block with longest interval of no ventricular rhythm of 2.2 seconds. All episodes of AV block occurred during sleep.   CT SCANS  CT CORONARY MORPH W/CTA COR W/SCORE 02/20/2024  Addendum 02/28/2024  9:33 AM ADDENDUM REPORT: 02/28/2024 09:31  EXAM: OVER-READ INTERPRETATION  CT CHEST  The following report is an over-read performed by radiologist Dr. Andrea Gasman of [facility] on 02/28/2024. This over-read does not include interpretation of cardiac or coronary anatomy or pathology. The coronary CTA interpretation by the cardiologist is attached.  COMPARISON:  Chest CT 06/02/2020  FINDINGS: Vascular: No aortic atherosclerosis. The included aorta is upper normal in caliber, 3.8 cm. Pacemaker wires in place.  Mediastinum/nodes: No adenopathy or mass. Unremarkable esophagus.  Lungs: Heterogeneous pulmonary parenchyma. 9 mm left lower lobe pulmonary nodule, series 304, image 148. This potentially but not definitively with seen on 2022 exam. No pleural fluid. The  included airways are patent.  Upper abdomen: No acute or unexpected findings.  Musculoskeletal: There are no acute or suspicious osseous abnormalities. Thoracic spondylosis.  IMPRESSION: 1. A 9 mm left lower lobe pulmonary nodule. This potentially but not definitively with seen on 2022 exam. Recommend prompt full field of view chest CT for further assessment. 2. Heterogeneous pulmonary parenchyma, may be related to scarring or can be seen with small airways disease.   Electronically Signed By: Andrea Gasman M.D. On: 02/28/2024 09:31  Narrative CLINICAL DATA:  Chest pain  EXAM: Cardiac CTA  MEDICATIONS: Sub lingual nitro. 4mg  and lopressor  100mg   TECHNIQUE: A non-contrast, gated CT scan was obtained with axial slices of 2.5 mm through the heart for calcium  scoring. Calcium  scoring was performed using the Agatston method. A 120 kV prospective, gated, contrast cardiac CT scan was obtained. Gantry rotation speed was 230 msec and collimation was 0.63 mm. Two sublingual nitroglycerin  tablets (0.8 mg) were given. The 3D data set was reconstructed with motion correction for the best systolic or diastolic phase. Images were analyzed on a dedicated workstation using MPR, MIP, and VRT modes. The patient received 95 cc of contrast.  FINDINGS: Non-cardiac: See separate report from [facility]. No significant findings on limited lung and soft tissue windows.  Calcium  Score: 3 vessel calcium  noted  LM 0  LAD 717  LCX 185  RCA 344  Total 1246  Coronary Arteries: Right dominant with no anomalies  LM: Normal  LAD: 50-69% mixed plaque in proximal/mid vessel. 25-49% calcified plaque distally  D1: Small vessel calcified not well seen  D2: 25-49% mixed plaque proximally and mid vessel  Circumflex: 25-49% mixed plaque in proximal and mid vessel  OM1: Motion artifact. 50-69% mixed plaque in proximal and mid vessel  OM2: Small vessel not well seen  AV  Groove: Mixed plaque not well seen  RCA: 25-49% proximal and mid vessel mixed plaque. 1-24% calcified plaque distally  PDA: 1-24% mixed plaque  PLA: 25-49% calcified plaque  IMPRESSION: Overall poor quality study due to patients morbid obesity, motion artifact in single phase scan and some PPM artifact  1. 3 vessel calcium  noted with score 1246 which is 99 th percentile for age/sex  2.  Pacing wires noted in RA/RV  3.  Dilated ascending thoracic aorta 4.1 cm in orthogonal planes  4. CAD RADS 3 possibly obstructive CAD with high calcium  score Study sent for FFR CT Suspect it will be returned due to poor image quality  Maude Emmer  Electronically Signed: By: Maude Emmer M.D. On: 02/20/2024 17:24   CARDIAC MRI  MR CARDIAC MORPHOLOGY W WO CONTRAST 02/03/2021  Narrative CLINICAL DATA:  Clinical question of amyloidosis Study assumes BSA 2.63  EXAM: CARDIAC MRI  TECHNIQUE: The patient was scanned on a 1.5 Tesla GE magnet. A dedicated cardiac coil was used. Functional imaging was done using Fiesta sequences. 2,3, and 4 chamber views were done to assess for RWMA's. Modified Simpson's rule using a short axis stack was used to calculate an ejection fraction on a dedicated work [employer]. The patient received 10 cc of Gadavist . After 10 minutes inversion recovery sequences were used to assess for infiltration and scar tissue.  CONTRAST:  10 cc  of Gadavist   FINDINGS: 1. Normal left ventricular size, with LVEDD 46 mm, and LVEDVi 61 mL/m2.  Increased left ventricular thickness, with intraventricular septal thickness of 17 mm, posterior wall thickness of 11 mm, and septal to posterior ratio 1.5.  Myocardial mass index of 72 g/m2 (normal).  Normal left ventricular systolic function (LVEF = 59%). There are no regional wall motion abnormalities. No evidence of systolic anterior motion of the mitral valve.  Left ventricular parametric mapping  notable for normal T1 signal (apical anterior is likely artifact) and normal T2 signal.  There is late gadolinium enhancement in the left ventricular myocardium: Patchy LGE seen in the anteroseptal base comprising 3.5 percent of total myocardium.  2. Normal right ventricular size with RVEDVI 79 mL/m2.  Normal right ventricular thickness.  Normal right ventricular systolic function (RVEF =47%). There are no regional wall motion abnormalities or aneurysms.  3.  Normal left and right atrial size.  4.  Normal size of the aortic root, ascending aorta.  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  5.  Grossly, no significant valvular abnormalities.  6.  Normal pericardium.  No pericardial effusion.  7. Grossly, no extracardiac findings; pacemaker in place with RV lead leading to artifact in some series. Recommended dedicated study if concerned for non-cardiac pathology.  8.  Device artifact noted.  IMPRESSION: Normal Native T1 signal, ability to null the myocardium, normal atrial size and LGE pattern are not consistent with cardiac amyloidosis.  In lieu of systemic disease that would cause hypertrophy, study consistent with hypertrophic cardiomyopathy.  Stanly Leavens MD   Electronically Signed By: Stanly Leavens M.D. On: 02/08/2021 10:45  PYP SCAN  MYOCARDIAL AMYLOID PLANAR AND SPECT 12/16/2020  Interpretation Summary  By semi-quantitative assessment, there is Grade 1 uptake (less than rib)  The heart-to-contralateral lung ratio was 1.1 at 1 hour and 1.2 at 3 hours  Overall, the study is equivocal for ATTR amyloidosis  Powell Sorrow, MD  ______________________________________________________________________________________________     Recent Labs: 11/15/2023: ALT 23 03/01/2024: Platelets 253 03/05/2024: BUN 23; Creatinine, Ser 1.44 03/07/2024: Hemoglobin 13.9; Potassium 4.9; Sodium 138  Recent Lipid Panel    Component Value  Date/Time   CHOL 102 11/15/2023 0817   TRIG 196 (H)  11/15/2023 0817   HDL 28 (L) 11/15/2023 0817   CHOLHDL 3.6 11/15/2023 0817   CHOLHDL 6.0 02/28/2014 1508   VLDL NOT CALC 02/28/2014 1508   LDLCALC 42 11/15/2023 0817   LDLDIRECT 108 (H) 03/01/2014 0409    History of Present Illness    58 year old male with the above past medical history including nonobstructive CAD, LVH, chronic HFpEF, paroxysmal atrial fibrillation on Eliquis , sick sinus syndrome s/p PPM in 2022, RBBB, LAFB, hypertension, hyperlipidemia, type 2 diabetes, CKD, hepatic steatosis, OSA not on CPAP, and alcohol use disorder.  Prior cardiac catheterization in 2018 at [facility] revealed mild to moderate nonobstructive coronary artery disease.  He has a history of paroxysmal atrial fibrillation, sick sinus syndrome, s/p PPM in 2022.  Lexiscan  Myoview  in 10/2023 showed mild to moderate perfusion defect in stress images in the anterior wall, worse at apex, intermediate risk, findings consistent with ischemia.  Cardiac catheterization was recommended but denied by insurance.  Echocardiogram in 11/2023 showed EF 70 to 75%, hyperdynamic LV function, poorly visualized RV, no significant valvular abnormalities, mild dilation ascending aorta measuring 42 mm.  Coronary CT angiogram in 01/2024 revealed coronary calcium  score of 1246 (99th percentile), 3 vessel coronary calcification, nondiagnostic FFR due to poor image quality.  Follow-up cardiac catheterization was recommended.  He was last seen in the office on 02/29/2024 and was stable from a cardiac standpoint.  He reported stable chronic dyspnea, denied chest pain.  R/LHC on 03/07/2024 revealed mild nonobstructive CAD (pRCA 20% stenosis, mid LAD 20% stenosis), elevated right sided filling pressures, low PA pulsatility index, ongoing diuresis was recommended.   He presents today for follow-up.  Since his procedure he has been stable from a cardiac standpoint.  He does note ongoing dyspnea on  exertion, unchanged from prior visits.  He denies chest pain.  He has stable nonpitting bilateral lower extremity edema, mild abdominal fullness.   He is sedentary at baseline, wheelchair-bound. Weight is stable.   Home Medications    Current Outpatient Medications  Medication Sig Dispense Refill   Accu-Chek Softclix Lancets lancets Check blood sugar 3 times a day 100 each 12   acetaminophen  (TYLENOL ) 500 MG tablet Take 1,000 mg by mouth 3 (three) times daily.     albuterol  (VENTOLIN  HFA) 108 (90 Base) MCG/ACT inhaler Inhale 2 puffs into the lungs every 6 (six) hours as needed for shortness of breath or wheezing.     amLODipine  (NORVASC ) 10 MG tablet Take 1 tablet (10 mg total) by mouth daily. 90 tablet 1   apixaban  (ELIQUIS ) 5 MG TABS tablet Take 1 tablet (5 mg total) by mouth 2 (two) times daily. 180 tablet 3   aspirin  81 MG chewable tablet Chew 81 mg by mouth daily.     atorvastatin  (LIPITOR ) 80 MG tablet TAKE 1 TABLET (80 MG TOTAL) BY MOUTH DAILY. (Patient taking differently: Take 40 mg by mouth at bedtime.) 90 tablet 3   brexpiprazole (REXULTI) 2 MG TABS tablet Take 2 mg by mouth daily.     Cholecalciferol 125 MCG (5000 UT) TABS Take 5,000 Units by mouth daily.     Dulaglutide  3 MG/0.5ML SOAJ Inject 0.5 mLs into the skin once a week.     DULoxetine  (CYMBALTA ) 60 MG capsule Take 2 capsules (120 mg total) by mouth daily. (Patient taking differently: Take 60 mg by mouth daily.)  3   furosemide  (LASIX ) 40 MG tablet Take 2 tablets ( 80 mg ) every morning for 3 mornings then decrease and take 1  tablet (40 mg ) every morning. (Patient taking differently: Take 40 mg by mouth See admin instructions. Take 2 tablets ( 80 mg ) every morning for 3 mornings then decrease and take 1 tablet (40 mg ) every morning.) 96 tablet 3   gabapentin  (NEURONTIN ) 300 MG capsule TAKE 1 CAPSULE (300 MG TOTAL) BY MOUTH IN THE MORNING, AT NOON, AND AT BEDTIME. 270 capsule 1   glucose blood (ACCU-CHEK GUIDE) test strip Check  blood sugar 3 times a day 100 each 12   guaiFENesin (ROBITUSSIN) 100 MG/5ML liquid Take 10 mLs by mouth 4 (four) times daily as needed for cough.     HUMALOG KWIKPEN 100 UNIT/ML KwikPen Inject 17 Units into the skin 3 (three) times daily with meals. Lispro Hold of BS is less than 100 and or PT is not eating Also as needed four time a day for BS Greater that 450: recheck BS in 1 hr     insulin  glargine (LANTUS ) 100 UNIT/ML injection Inject 30 Units into the skin 2 (two) times daily.     loratadine (CLARITIN) 10 MG tablet Take 10 mg by mouth daily.     methocarbamol  (ROBAXIN ) 500 MG tablet Take 1 tablet (500 mg total) by mouth 2 (two) times daily. 20 tablet 0   metoprolol  succinate (TOPROL  XL) 25 MG 24 hr tablet Take 1 tablet (25 mg total) by mouth at bedtime. 90 tablet 1   nystatin  (MYCOSTATIN /NYSTOP ) powder Apply 1 Application topically 3 (three) times daily. (Patient taking differently: Apply 1 Application topically 2 (two) times daily.) 15 g 0   pantoprazole  (PROTONIX ) 40 MG tablet TAKE 1 TABLET (40 MG TOTAL) BY MOUTH DAILY. 90 tablet 3   polyethylene glycol (MIRALAX  / GLYCOLAX ) 17 g packet Take 17 g by mouth daily as needed for mild constipation, moderate constipation or severe constipation.     diphenhydrAMINE (BENADRYL) 50 MG tablet Take 50 mg before leaves home for cardiac cath (Patient not taking: Reported on 04/04/2024) 1 tablet 0   predniSONE  (DELTASONE ) 50 MG tablet Take 50 mg 13 hours before cardiac cath   Take 50 mg 7 hours before cardiac cath    Take 50 mg before leaves home for cardiac cath (Patient not taking: Reported on 04/04/2024) 3 tablet 0   No current facility-administered medications for this visit.     Review of Systems    He denies chest pain, palpitations, pnd, orthopnea, n, v, dizziness, syncope, weight gain, or early satiety. All other systems reviewed and are otherwise negative except as noted above.   Physical Exam    VS:  BP 132/80 (BP Location: Left Arm, Patient  Position: Sitting, Cuff Size: Large)   Pulse 77   Ht 5' 10 (1.778 m)   Wt (!) 367 lb 12.8 oz (166.8 kg)   SpO2 95%   BMI 52.77 kg/m  GEN: Well nourished, well developed, in no acute distress. HEENT: normal. Neck: Supple, no JVD, carotid bruits, or masses. Cardiac: RRR, no murmurs, rubs, or gallops. No clubbing, cyanosis, nonpitting bilateral lower extremity edema.  Radials/DP/PT 2+ and equal bilaterally.  Right radial cath site without bruising, bleeding, bruit or hematoma. Respiratory:  Respirations regular and unlabored, clear to auscultation bilaterally. GI: Soft, nontender, nondistended, BS + x 4. MS: no deformity or atrophy. Skin: warm and dry, no rash. Neuro:  Strength and sensation are intact. Psych: Normal affect.  Accessory Clinical Findings    ECG personally reviewed by me today - EKG Interpretation Date/Time:  Wednesday April 04 2024  10:14:39 EST Ventricular Rate:  77 PR Interval:  214 QRS Duration:  172 QT Interval:  416 QTC Calculation: 470 R Axis:   -86  Text Interpretation: Atrial-sensed ventricular-paced rhythm with prolonged AV conduction When compared with ECG of 29-Feb-2024 15:04, Electronic ventricular pacemaker has replaced Electronic atrial pacemaker Confirmed by Daneen Perkins ([id number]) on 04/04/2024 10:22:33 AM  - no acute changes.   Lab Results  Component Value Date   WBC 12.5 (H) 03/01/2024   HGB 13.9 03/07/2024   HCT 41.0 03/07/2024   MCV 99 (H) 03/01/2024   PLT 253 03/01/2024   Lab Results  Component Value Date   CREATININE 1.44 (H) 03/05/2024   BUN 23 03/05/2024   NA 138 03/07/2024   K 4.9 03/07/2024   CL 98 03/05/2024   CO2 21 03/05/2024   Lab Results  Component Value Date   ALT 23 11/15/2023   AST 17 11/15/2023   ALKPHOS 117 11/15/2023   BILITOT <0.2 11/15/2023   Lab Results  Component Value Date   CHOL 102 11/15/2023   HDL 28 (L) 11/15/2023   LDLCALC 42 11/15/2023   LDLDIRECT 108 (H) 03/01/2014   TRIG 196 (H) 11/15/2023    CHOLHDL 3.6 11/15/2023    Lab Results  Component Value Date   HGBA1C >15.5 (H) 10/23/2022    Assessment & Plan   1. Nonobstructive CAD: Prior cardiac catheterization in 2018 at [facility] revealed mild to moderate nonobstructive coronary artery disease.  Lexiscan  Myoview  in 10/2023 showed mild to moderate perfusion defect in stress images in the anterior wall, worse at apex, intermediate risk, findings consistent with ischemia. Coronary CT angiogram in 01/2024 revealed coronary calcium  score of 1246 (99th percentile), 3 vessel coronary calcification, nondiagnostic FFR due to poor image quality. R/LHC on 03/07/2024 revealed mild nonobstructive CAD (pRCA 20% stenosis, mid LAD 20% stenosis), elevated right sided filling pressures, low PA pulsatility index, ongoing diuresis was recommended.  He reports stable chronic dyspnea, unchanged from prior visits, denies chest pain.  Recent cath overall reassuring.  Continue to monitor for progressive symptoms.  Continue aspirin , metoprolol , amlodipine , and Lipitor .  2. Hypertensive LVH/HFpEF:  Echocardiogram in 11/2023 showed EF 70 to 75%, hyperdynamic LV function, poorly visualized RV, no significant valvular abnormalities, mild dilation ascending aorta measuring 42 mm. Recent RHC as above.  He reports stable chronic dyspnea, unchanged from prior visits, weight has been stable.  He has nonpitting bilateral lower extremity edema, mild abdominal fullness.  Fluid volume status somewhat difficult to assess in the setting of body habitus.  Generally well compensated on exam.  Continue to monitor weights.  Reviewed sodium recommendations.  Continue Lasix .    3. Paroxysmal atrial fibrillation: Denies palpitations. Most recent device interrogation in 02/2024 revealed less than 1% A-fib burden.  Recent CBC, BMET stable, denies bleeding.  Continue metoprolol , Eliquis .  4. Sick sinus syndrome: S/p PPM.  Most recent device interrogation in 02/2024 showed a sensed, V paced  rhythm, PACs, multiple AHR detections, longest lasting 16 seconds, less than 1% A-fib burden.  Followed by EP.   5. Mild dilation of the ascending aorta: Noted on most recent echocardiogram, 42 mm.  Consider repeat echocardiogram versus CT chest/aorta in 1 year.  6. Hypertension: BP well controlled. Continue current antihypertensive regimen.   7. Hyperlipidemia: LDL was 42 in 10/2023.  Continue Lipitor .  8. Type 2 diabetes: A1c was greater than 15.5 in 10/2022.  No recent A1c on file.  Managed per PCP (nursing facility).  9. CKD stage IIIa: Creatinine  was stable at 1.44 in 02/2024.  10. OSA: Not on CPAP at this time. Pending repeat sleep study.   11. Disposition: Follow-up in 3 months.      Damien JAYSON Braver, NP 04/04/2024, 10:48 AM

## 2024-05-15 NOTE — Addendum Note (Signed)
 Addended by: RANDY HAMP SAILOR on: 05/15/2024 11:17 AM   Modules accepted: Orders

## 2024-05-18 NOTE — Addendum Note (Signed)
 Addended by: [name] on: 05/18/2024 05:26 PM   Modules accepted: Orders

## 2024-05-18 NOTE — Telephone Encounter (Signed)
 ----- Message from [name], MD sent at 05/02/2024 10:23 AM EST ----- Regarding: RE: Appointment with Dr. Santo Patient also has an abnormal noncardiac portion of CT showing a pulmonary nodule and some scarring that needs to be followed up on.  Please get high-resolution chest CT ----- Message ----- From: [name], RN Sent: 05/02/2024   8:14 AM EST To: [name], MD Subject: FW: Appointment with Dr. Santo         Dr. Santo is saying he doesn't have obstructive HOCM, do you still want patient referred? Geni ----- Message ----- From: Santo Stanly LABOR, MD Sent: 04/22/2024   3:32 PM EST To: Geni CROME Janit, RN Subject: RE: Appointment with Dr. Santo         Happy to see him.  I don't see that he has obstructive HCM but happy to support his care nonetheless ----- Message ----- From: [name], RN Sent: 04/18/2024   2:10 PM EST To: Stanly LABOR Santo, MD Subject: FW: Appointment with Dr. Santo Lob Dr. Santo,  This is a patient of Dr. Dorine who was lost to follow up for a little while, but we have him back on track with his follow up now. You saw him in 2022 and Dr. Bihari would like him to get back in  with you. I haven't received a response from the schedulers yet. I see an open HOCM slot on 04/24/24, would it be ok to offer that to him? Geni, RN  ----- Message ----- From: Randy Hamp SAILOR, RN Sent: 03/05/2024   5:54 PM EST To: Geni CROME Janit, RN Subject: RE: Appointment with Dr. Santo Peters send to [facility] scheduler for HOCM clinic to schedule visit.    Hamp, RN ----- Message ----- From: [name], RN Sent: 03/05/2024   1:33 PM EDT To: Hamp SAILOR Randy, RN Subject: Appointment with Dr. Santo Walterine Hamp,  Wondering if we can set this patient up for an appointment with Dr. Santo for after his cath. Patient has done some work-up for HOCM. He  moved into [facility]isted [facility] recently and is now able to  complete his workup. He has a cath coming up but then Dr. Bihari was hoping to get him back in with Dr. JAYSON to see where he was with HOCM work up. Geni, RN ----- Message ----- From: Bihari Wilbert JONELLE, MD Sent: 02/23/2024   2:24 PM EDT To: Geni CROME Janit, RN  It is a stress echo and would cancel for now and just get back in with Dr. Santo ----- Message ----- From: [name], RN Sent: 02/23/2024   1:41 PM EDT To: [name], MD  ----- Message from Geni CROME Janit, RN sent at 02/23/2024  1:41 PM EDT -----  I think this is the guy who also needed a stress echo. Is that still a possibility but maybe for after the cath? ----- Message ----- From: Bihari Wilbert JONELLE, MD Sent: 02/21/2024   3:02 PM EDT To: Geni CROME Janit, RN  Coronary CTA showed a coronary calcium  score of 1246 with 50 to 69% proximal to mid LAD, 25 to 49% distal LAD, 25 to 49% proximal to mid D2 and left circumflex, 50 to 69% proximal to mid OM1, 25 to  49% proximal to mid RCA, less than 25% distal RCA/PDA,  25 to 49% PLA.  Lipids look good in June 2025.  I would like him to be set up for office visit with extender to get set up for a left heart  catheterization.  FFR could not be completed on coronary CTA due to poor imaging from morbid obesity. ----- Message ----- From: Interface, Rad Results In Sent: 02/20/2024   5:27 PM EDT To: [name], MD

## 2024-05-18 NOTE — Telephone Encounter (Signed)
 Call to patient to advise that on noncardiac portion of CT  a pulmonary nodule and some scarring is showing that needs to be followed up on. Patient agrees to get high-resolution chest CT, order placed.

## 2024-05-30 ENCOUNTER — Ambulatory Visit: Admitting: Internal Medicine

## 2024-06-19 ENCOUNTER — Ambulatory Visit

## 2024-06-19 DIAGNOSIS — I442 Atrioventricular block, complete: Secondary | ICD-10-CM

## 2024-06-20 LAB — CUP PACEART REMOTE DEVICE CHECK
Battery Remaining Longevity: 67 mo
Battery Remaining Percentage: 59 %
Battery Voltage: 3.01 V
Brady Statistic AP VP Percent: 28 %
Brady Statistic AP VS Percent: 1 %
Brady Statistic AS VP Percent: 72 %
Brady Statistic AS VS Percent: 1 %
Brady Statistic RA Percent Paced: 27 %
Brady Statistic RV Percent Paced: 99 %
Date Time Interrogation Session: 20260127040015
Implantable Lead Connection Status: 753985
Implantable Lead Connection Status: 753985
Implantable Lead Implant Date: 20220201
Implantable Lead Implant Date: 20220201
Implantable Lead Location: 753859
Implantable Lead Location: 753860
Implantable Pulse Generator Implant Date: 20220201
Lead Channel Impedance Value: 360 Ohm
Lead Channel Impedance Value: 410 Ohm
Lead Channel Pacing Threshold Amplitude: 0.5 V
Lead Channel Pacing Threshold Amplitude: 1.125 V
Lead Channel Pacing Threshold Pulse Width: 0.4 ms
Lead Channel Pacing Threshold Pulse Width: 0.4 ms
Lead Channel Sensing Intrinsic Amplitude: 12 mV
Lead Channel Sensing Intrinsic Amplitude: 3.8 mV
Lead Channel Setting Pacing Amplitude: 1.375
Lead Channel Setting Pacing Amplitude: 2 V
Lead Channel Setting Pacing Pulse Width: 0.4 ms
Lead Channel Setting Sensing Sensitivity: 2 mV
Pulse Gen Model: 2272
Pulse Gen Serial Number: 3895834

## 2024-06-24 ENCOUNTER — Ambulatory Visit: Payer: Self-pay | Admitting: Student in an Organized Health Care Education/Training Program

## 2024-06-27 NOTE — Progress Notes (Signed)
 Remote PPM Transmission

## 2024-07-04 ENCOUNTER — Ambulatory Visit: Admitting: Cardiology

## 2024-08-14 ENCOUNTER — Ambulatory Visit: Admitting: Diagnostic Neuroimaging

## 2024-09-04 ENCOUNTER — Ambulatory Visit: Admitting: Urology

## 2024-09-18 ENCOUNTER — Ambulatory Visit

## 2024-12-18 ENCOUNTER — Ambulatory Visit

## 2025-03-19 ENCOUNTER — Ambulatory Visit

## 2025-06-18 ENCOUNTER — Ambulatory Visit

## 2025-09-17 ENCOUNTER — Ambulatory Visit
